# Patient Record
Sex: Male | Born: 1950 | Race: White | Hispanic: No | Marital: Married | Smoking: Never smoker
Health system: Southern US, Community
[De-identification: ages and names within clinical notes are randomized; demographics above are authoritative.]

## PROBLEM LIST (undated history)

## (undated) DIAGNOSIS — I2510 Atherosclerotic heart disease of native coronary artery without angina pectoris: Secondary | ICD-10-CM

## (undated) DIAGNOSIS — M1990 Unspecified osteoarthritis, unspecified site: Secondary | ICD-10-CM

## (undated) DIAGNOSIS — M2550 Pain in unspecified joint: Secondary | ICD-10-CM

## (undated) DIAGNOSIS — R0600 Dyspnea, unspecified: Secondary | ICD-10-CM

## (undated) DIAGNOSIS — G4730 Sleep apnea, unspecified: Secondary | ICD-10-CM

## (undated) DIAGNOSIS — E119 Type 2 diabetes mellitus without complications: Secondary | ICD-10-CM

## (undated) DIAGNOSIS — E669 Obesity, unspecified: Secondary | ICD-10-CM

## (undated) DIAGNOSIS — Z87442 Personal history of urinary calculi: Secondary | ICD-10-CM

## (undated) DIAGNOSIS — K219 Gastro-esophageal reflux disease without esophagitis: Secondary | ICD-10-CM

## (undated) DIAGNOSIS — I10 Essential (primary) hypertension: Secondary | ICD-10-CM

## (undated) DIAGNOSIS — G4733 Obstructive sleep apnea (adult) (pediatric): Secondary | ICD-10-CM

## (undated) DIAGNOSIS — N200 Calculus of kidney: Secondary | ICD-10-CM

## (undated) DIAGNOSIS — E785 Hyperlipidemia, unspecified: Secondary | ICD-10-CM

## (undated) HISTORY — DX: Calculus of kidney: N20.0

## (undated) HISTORY — DX: Type 2 diabetes mellitus without complications: E11.9

## (undated) HISTORY — DX: Essential (primary) hypertension: I10

## (undated) HISTORY — DX: Pain in unspecified joint: M25.50

## (undated) HISTORY — DX: Hyperlipidemia, unspecified: E78.5

## (undated) HISTORY — DX: Obstructive sleep apnea (adult) (pediatric): G47.33

## (undated) HISTORY — DX: Atherosclerotic heart disease of native coronary artery without angina pectoris: I25.10

## (undated) HISTORY — DX: Obesity, unspecified: E66.9

## (undated) HISTORY — DX: Gastro-esophageal reflux disease without esophagitis: K21.9

---

## 2000-11-13 ENCOUNTER — Inpatient Hospital Stay (HOSPITAL_COMMUNITY): Admission: AD | Admit: 2000-11-13 | Discharge: 2000-11-14 | Payer: Self-pay | Admitting: Internal Medicine

## 2000-11-13 ENCOUNTER — Encounter: Payer: Self-pay | Admitting: Internal Medicine

## 2000-11-16 ENCOUNTER — Ambulatory Visit (HOSPITAL_COMMUNITY): Admission: RE | Admit: 2000-11-16 | Discharge: 2000-11-17 | Payer: Self-pay | Admitting: Cardiology

## 2000-11-16 ENCOUNTER — Encounter: Payer: Self-pay | Admitting: Cardiology

## 2000-11-19 ENCOUNTER — Inpatient Hospital Stay (HOSPITAL_COMMUNITY): Admission: EM | Admit: 2000-11-19 | Discharge: 2000-11-20 | Payer: Self-pay | Admitting: Emergency Medicine

## 2000-12-04 ENCOUNTER — Inpatient Hospital Stay (HOSPITAL_COMMUNITY): Admission: EM | Admit: 2000-12-04 | Discharge: 2000-12-06 | Payer: Self-pay | Admitting: Cardiology

## 2000-12-04 ENCOUNTER — Encounter: Payer: Self-pay | Admitting: Emergency Medicine

## 2000-12-04 ENCOUNTER — Encounter: Payer: Self-pay | Admitting: Cardiology

## 2000-12-05 ENCOUNTER — Encounter: Payer: Self-pay | Admitting: Cardiology

## 2001-01-07 ENCOUNTER — Ambulatory Visit (HOSPITAL_COMMUNITY): Admission: RE | Admit: 2001-01-07 | Discharge: 2001-01-07 | Payer: Self-pay | Admitting: Internal Medicine

## 2001-01-12 ENCOUNTER — Ambulatory Visit (HOSPITAL_COMMUNITY): Admission: RE | Admit: 2001-01-12 | Discharge: 2001-01-12 | Payer: Self-pay | Admitting: Internal Medicine

## 2001-01-12 ENCOUNTER — Encounter: Payer: Self-pay | Admitting: Internal Medicine

## 2001-03-08 ENCOUNTER — Ambulatory Visit (HOSPITAL_COMMUNITY): Admission: RE | Admit: 2001-03-08 | Discharge: 2001-03-08 | Payer: Self-pay | Admitting: Internal Medicine

## 2001-08-05 ENCOUNTER — Encounter: Payer: Self-pay | Admitting: Internal Medicine

## 2001-08-05 ENCOUNTER — Ambulatory Visit (HOSPITAL_COMMUNITY): Admission: RE | Admit: 2001-08-05 | Discharge: 2001-08-05 | Payer: Self-pay | Admitting: Internal Medicine

## 2001-08-08 ENCOUNTER — Other Ambulatory Visit: Admission: RE | Admit: 2001-08-08 | Discharge: 2001-08-08 | Payer: Self-pay | Admitting: Dermatology

## 2001-11-09 ENCOUNTER — Encounter (HOSPITAL_COMMUNITY): Admission: RE | Admit: 2001-11-09 | Discharge: 2001-12-09 | Payer: Self-pay | Admitting: *Deleted

## 2004-08-12 ENCOUNTER — Ambulatory Visit (HOSPITAL_COMMUNITY): Admission: RE | Admit: 2004-08-12 | Discharge: 2004-08-12 | Payer: Self-pay | Admitting: Family Medicine

## 2004-08-14 ENCOUNTER — Inpatient Hospital Stay (HOSPITAL_COMMUNITY): Admission: AD | Admit: 2004-08-14 | Discharge: 2004-08-18 | Payer: Self-pay | Admitting: Family Medicine

## 2004-08-14 ENCOUNTER — Ambulatory Visit: Payer: Self-pay | Admitting: Internal Medicine

## 2004-09-02 ENCOUNTER — Ambulatory Visit: Payer: Self-pay | Admitting: Internal Medicine

## 2004-09-10 ENCOUNTER — Ambulatory Visit (HOSPITAL_COMMUNITY): Admission: RE | Admit: 2004-09-10 | Discharge: 2004-09-10 | Payer: Self-pay | Admitting: Internal Medicine

## 2004-11-20 ENCOUNTER — Ambulatory Visit: Payer: Self-pay | Admitting: Internal Medicine

## 2004-12-12 ENCOUNTER — Ambulatory Visit (HOSPITAL_COMMUNITY): Admission: RE | Admit: 2004-12-12 | Discharge: 2004-12-12 | Payer: Self-pay | Admitting: Internal Medicine

## 2005-05-27 ENCOUNTER — Ambulatory Visit (HOSPITAL_COMMUNITY): Admission: RE | Admit: 2005-05-27 | Discharge: 2005-05-27 | Payer: Self-pay | Admitting: Internal Medicine

## 2005-08-25 ENCOUNTER — Ambulatory Visit (HOSPITAL_COMMUNITY): Admission: RE | Admit: 2005-08-25 | Discharge: 2005-08-25 | Payer: Self-pay | Admitting: Family Medicine

## 2005-09-08 ENCOUNTER — Ambulatory Visit: Payer: Self-pay | Admitting: Internal Medicine

## 2006-03-21 ENCOUNTER — Emergency Department (HOSPITAL_COMMUNITY): Admission: EM | Admit: 2006-03-21 | Discharge: 2006-03-21 | Payer: Self-pay | Admitting: *Deleted

## 2006-06-25 ENCOUNTER — Ambulatory Visit (HOSPITAL_COMMUNITY): Admission: RE | Admit: 2006-06-25 | Discharge: 2006-06-25 | Payer: Self-pay | Admitting: Internal Medicine

## 2006-09-21 ENCOUNTER — Ambulatory Visit (HOSPITAL_COMMUNITY): Admission: RE | Admit: 2006-09-21 | Discharge: 2006-09-21 | Payer: Self-pay | Admitting: Cardiology

## 2006-10-12 ENCOUNTER — Inpatient Hospital Stay (HOSPITAL_COMMUNITY): Admission: RE | Admit: 2006-10-12 | Discharge: 2006-10-13 | Payer: Self-pay | Admitting: Cardiology

## 2007-05-13 ENCOUNTER — Inpatient Hospital Stay (HOSPITAL_COMMUNITY): Admission: RE | Admit: 2007-05-13 | Discharge: 2007-05-14 | Payer: Self-pay | Admitting: Cardiology

## 2007-05-27 ENCOUNTER — Ambulatory Visit (HOSPITAL_COMMUNITY): Admission: RE | Admit: 2007-05-27 | Discharge: 2007-05-27 | Payer: Self-pay | Admitting: Internal Medicine

## 2007-08-29 ENCOUNTER — Ambulatory Visit (HOSPITAL_COMMUNITY): Admission: RE | Admit: 2007-08-29 | Discharge: 2007-08-29 | Payer: Self-pay | Admitting: Internal Medicine

## 2010-06-29 ENCOUNTER — Encounter: Payer: Self-pay | Admitting: Internal Medicine

## 2010-10-21 NOTE — Discharge Summary (Signed)
NAMEALDEN, Shepard             ACCOUNT NO.:  [id number]   MEDICAL RECORD NO.:  [id number]          PATIENT TYPE:  INP   LOCATION:  6525                         FACILITY:  [facility]   PHYSICIAN:  Madaline Savage, M.D.DATE OF BIRTH:  [birth year]   DATE OF ADMISSION:  05/13/2007  DATE OF DISCHARGE:  05/14/2007                               DISCHARGE SUMMARY   [name] is a 60 year old male patient of Dr. Madaline Savage,  M.D., who has known coronary artery disease.  He was seen in the office.  He was having recurrent angina.  It was decided he should undergo  cardiac catheterization.  He also has diabetes, mild obesity and  hyperlipidemia.  He came in for an elective catheterization on May 13, 2007.  He was found to have in-stent restenosis (80%) of his RCA  stent.  He had a new proximal LAD lesion of 80%.  He subsequently  underwent Promus 3.0 x 15 LAD stenting, and he had a Liberty monorail of  2.5 x 8 mm as a second sandwich stent in his RCA.  His EF was 60%.   The following day his blood pressure was 120/61, pulse was 79,  respirations 12, temperature was 97.5, O2 saturations were 98%.  He was  in sinus rhythm with some PVCs.  His labs were stable.  Hemoglobin 14.5,  hematocrit 42.5 platelets 149 and WBCs 8.4.  His sodium was 140,  potassium 4.2, BUN 11, creatinine 0.92, glucose 108.  His CK-MB was  64.7, troponin of 0.39 post procedure.   He was seen by Dr. Kem Boroughs and considered stable for discharge  home.   DISCHARGE MEDICATIONS:  Essentially unchanged:  1. Toprol XL 50 mg a day.  2. Aspirin was increased to 2 baby aspirins per day.  3. Protonix 40 mg a day.  4. Altace 1.25 mg a day.  5. Zocor 20 mg at night.  6. Niaspan 500 mg at night.  7. Metformin 500 mg twice a day -- He was told not to start until      Sunday.  8. Fish oil 1000 mg twice daily.  9. Plavix 75 mg a day -- He was told not to stop.  10.Glyburide at 4 mg a day.  11.Nitroglycerin  p.r.n.   He has an appointment to see Dr. Elsie Lincoln on May 27, 2007.  He will  stay out of work for a week, because he does lifting at work; he was  given a note for this.   DISCHARGE DIAGNOSES:  1. Angina.  2. Progressive coronary artery disease, with in-stent restenosis and      proximal LAD of 80% and subsequent stenting with a Promus stent in      his left anterior descending artery and Liberty stent in his RCA.  3. Ejection fraction of 60%.  4. Noninsulin-dependent diabetes mellitus.  5. Dyslipidemia.  6. Gastroesophageal reflux disease.      Lezlie Octave, N.P.    ______________________________  Madaline Savage, M.D.    BB/MEDQ  D:  05/14/2007  T:  05/15/2007  Job:  [id number]   cc:  Kirk Ruths, M.D.

## 2010-10-21 NOTE — Cardiovascular Report (Signed)
NAMEBRAINARD, HIGHFILL             ACCOUNT NO.:  [id number]   MEDICAL RECORD NO.:  [id number]          PATIENT TYPE:  INP   LOCATION:  6525                         FACILITY:  [facility]   PHYSICIAN:  Madaline Savage, M.D.DATE OF BIRTH:  Sep 08, 1950   DATE OF PROCEDURE:  05/13/2007  DATE OF DISCHARGE:                            CARDIAC CATHETERIZATION   PROCEDURES PERFORMED:  1. Selective coronary angiography by Judkins' technique.  2. Retrograde left heart catheterization.  3. Left ventricular angiography.  4. Intracoronary artery cutting balloon angioplasty of the proximal      right coronary artery for in-stent restenosis.  5. Sandwich stenting of the proximal portion of a Liberte stent, in      the last year or two.  6. De-Novo stenting of a new stenosis in the proximal LAD.   COMPLICATIONS:  None.   ENTRY SITE:  Right femoral.   DYE USED:  Omnipaque.   MEDICATIONS GIVEN:  1. Versed and fentanyl 1 mg and 50 mg four different times.  2. Angiomax infusion.  3. Plavix 300 mg.  4. Intracoronary artery nitroglycerin.   PATIENT PROFILE:  Mr. Dunton is a delightful 60 year old white married  gentleman from the [place] area, who is a medical patient of mine.  He has had previous right coronary artery stenting, approximately a year  ago and has recently developed exertional chest pain of increasing  severity, which started about a month ago.  He saw me in the office  yesterday, and I felt that the symptoms were classic and that we should  go ahead with diagnostic cardiac catheterization at [facility].  That was  accomplished today uneventfully.  We then found two new lesions that  required tenting, and we proceeded to do that.  No complications  occurred.   ANGIOGRAPHIC RESULTS:  Left main coronary artery normal.  LAD 80% focal  eccentric stenosis, just before septal perforator branch #1 and before  bifurcation of LAD and to LAD diagonal.  Mid and distal LAD normal.  This entirety  of diagonal normal, but LAD was 80% proximal.   Circumflex gave rise to one obtuse marginal branch, and entirety of  circumflex and obtuse marginal normal.  Right coronary artery contained  a radio-opaque stent in the downgoing limb of the mid RCA.  There is an  in-stent restenosis of the upper portion of the long stent which is  actually two overlapped Liberte stents.  The distal vessel consisted of  a large marginal and three branches beyond the posterior descending.  The distal RCA looked very normal.   The RCA was stented, underwent cutting balloon angioplasty, then place  of a second stent and post-dilatation with a non-compliant balloon, to  ensure a well-opposed stent to the blood vessel wall.   Similarly, a Promus 3.0 x 15-mm stent was placed in the proximal LAD,  placed very carefully to avoid side-branch stenosis and was deployed  nicely with preservation of side branches and preservation of TIMI III  flow.   The patient had pain during LAD inflations of the balloon and the stent,  but this when away quickly with deflation of  the balloon. Likewise, ST-  segment elevation occurred, concomitant with the ST-segment elevation.   The patient exited the cath lab pain-free with stable vital signs.  He  never had arrhythmias or any complication related to this procedure  including bleeding.  We left his arterial sheath in place in the right  groin, which was 6-French.  We will remove it when his ACT is less than  150, and the patient will be a candidate for discharge tomorrow morning  if all goes well.           ______________________________  Madaline Savage, M.D.     WHG/MEDQ  D:  05/13/2007  T:  05/14/2007  Job:  [id number]   cc:   Madaline Savage, M.D.  Kirk Ruths, M.D.

## 2010-10-24 NOTE — Consult Note (Signed)
NAMETHADEUS, GANDOLFI             ACCOUNT NO.:  [id number]   MEDICAL RECORD NO.:  [id number]          PATIENT TYPE:  INP   LOCATION:  A322                          FACILITY:  [facility]   PHYSICIAN:  Lionel December, M.D.    DATE OF BIRTH:  Jul 03, 1950   DATE OF CONSULTATION:  08/15/2004  DATE OF DISCHARGE:                                   CONSULTATION   REASON FOR CONSULTATION:  Possible mass on CT scan.   REQUESTING PHYSICIAN:  Patrica Duel, M.D.   HISTORY OF PRESENT ILLNESS:  The patient is a 60 year old Caucasian male  with five day history of postprandial nausea, vomiting, crampy abdominal  pain with abdominal distention. About a week ago, he broke out with shingles  in the right lower chest region. He noticed after that that every time he  ate he would develop abdominal pain, nausea, vomiting, and abdominal  distention. He ate a normal meal Sunday night and developed all of these  symptoms. He did not eat until Tuesday when he consumed a bowl of soup and  the symptoms returned. He says he has only eaten about three times in the  last week. Prior to this, he was having no post prandial symptoms. Denied  any chronic abdominal pain, constipation, diarrhea, melena, or rectal  bleeding. No hematemesis. His only GI issue is that of chronic poorly  controlled acid reflux which he has had for three to four years. He is on  Protonix 40 mg b.i.d. with continued symptoms. Denies any dysphagia,  odynophagia, or weight loss. He has actually had gradual weight gain. He did  note Monday that his stools became a little loose.   Upon admission, he had a CT of the abdomen and pelvis which revealed  multiple dilated loops of the distal jejunum and proximal ileum. The gastric  antrum, the duodenum, distal ileum, and colon all appeared to be  decompressed. There was a questionable soft tissue mass in the distal ileum  at approximately 10 cm from the ileocecal valve. There was questionable  transition  point somewhere along the mid to distal ileum. There were  numerous enlarged lymph nodes of the mesentery, the largest measuring 1.9 x  1.3 cm. There was minimal aortic atherosclerosis. Gallbladder and liver  appeared to be normal.   Labs revealed elevated glucose of 220. CBC was normal. BUN was 21.  Creatinine 1. Total bilirubin 0.8, alkaline phosphatase 91, AST elevated at  38, ALT elevated at 60, albumin 3.4, amylase 39, lipase 20.   MEDICATIONS AT HOME:  1.  Toprol 50 mg daily.  2.  Altace.  3.  Protonix 40 mg b.i.d.  4.  Zocor.  5.  Niaspan 500 mg q.d.  6.  Aspirin 325 mg q.d.  7.  Recently started Valtrex.   ALLERGIES:  No known drug allergies.   PAST MEDICAL HISTORY:  1.  Gastroesophageal reflux disease. He has had poorly controlled symptoms.      In 2002, he had an esophageal manometry which was normal; however, a 24-      hour ambulatory pH study off the medication revealed pathological  acid      reflux with a DeMeester score of 16.7. He also had an EGD prior to this      which revealed a couple of superficial antral erosions and negative      helicobacter pylori serologies. He had colonoscopy a few years ago. He      is not clear on the results. He thinks he might have had a polyp. He has      had coronary artery disease with catheterization most recently in 2002      which revealed patency of his stents and prior angioplasty sites. He has      had hypertension, hyperlipidemia, and recent outbreak of shingles in the      right lower chest region.   FAMILY HISTORY:  Negative for colorectal cancer, chronic GI illnesses, or  inflammatory bowel disease or liver disease.   SOCIAL HISTORY:  He is married and has two children. He is employed at  [employer]. He denies any history of tobacco use. He rarely  consumes alcohol.   REVIEW OF SYSTEMS:  GASTROINTESTINAL:  See HPI for GI. He has poorly  controlled acid-reflux symptoms. He complains of constant heartburn.  Denies  any prior history of elevated LFTs. CONSTITUTIONAL:  Complains of chronic  weight gain. CARDIOPULMONARY:  Denies chest pain or shortness of breath.   PHYSICAL EXAMINATION:  VITAL SIGNS:  Height 72 inches, weight 242,  temperature 98.1, pulse 94, respirations 20, blood pressure 136/85.  GENERAL:  Pleasant, obese, Caucasian male in no acute distress.  SKIN:  Warm and dry. No jaundice.  HEENT:  Pupils are equal, round, and reactive to light. Conjunctivae are  pink. Sclerae are nonicteric. Oropharyngeal mucosa moist and pink. No  lesions, erythema, or exudate. No lymphadenopathy or thyromegaly.  CHEST:  Lungs are clear to auscultation. Cardiac exam reveals regular rate  and rhythm. Normal S1 and S2. No murmurs, rubs, or gallops.  ABDOMEN:  Positive bowel sounds, hypoactive bowel sounds, obese but  symmetrical. Abdomen somewhat tight. Mild diffuse tenderness to deep  palpation. No organomegaly or masses appreciated. No rebound tenderness or  guarding. In the right lower chest region, he had a small patch of  approximately 2 cm in length of erythema and papules consistent with  shingles.  EXTREMITIES:  No edema.   LABORATORY DATA:  White count 7,300, hemoglobin 15.7, hematocrit 44.3,  platelets 192,000. Sodium 135, potassium 4.1, calcium 9.2. Amylase 39,  lipase 20.   IMPRESSION:  The patient is a 60 year old gentleman with what appears to be  small-bowel obstruction with possible transition zone in the mid to distal  ileum where there is a questionable soft tissue mass seen according to the  radiology report. There is also numerous mildly enlarged mesenteric lymph  nodes. Etiology is unclear at this time. Currently, he is asymptomatic  although he has not had any oral intake, and this has been the pattern of  his symptoms over the last week. He does not describe any chronic GI  symptoms suggestive of inflammatory bowel disease. Would had to be concerned  about lymphoma. 1.  He  also has mild transaminitis. It is unclear whether this is a chronic      issue for him. He is on Zocor and did previously have fatty infiltration      of the liver seen on ultrasound in 2002 although CT scan yesterday      reports liver to appear normal. He is noted to have elevated glucose on  admission and may have diabetes mellitus. Suspect his transaminitis is      due to nonalcoholic steatohepatitis given obesity and possible diabetes      mellitus. This will need to be further investigated, however.   RECOMMENDATIONS:  1.  Review CT and today's abdominal films.  2.  Hepatitis B and C serologies, ferritin, iron, and TIBC.  3.  Hemoglobin A1c.  4.  IV Protonix.      LL/MEDQ  D:  08/15/2004  T:  08/15/2004  Job:  [id number]

## 2010-10-24 NOTE — Op Note (Signed)
[facility]  Patient:    Jonathan Shepard, Jonathan Shepard Visit Number: [id number] MRN: [id number]          Service Type: END Location: DAY Attending Physician:  Jonathon Bellows Dictated by:   Tana Coast, P.A. Proc. Date: 03/09/01 Admit Date:  03/08/2001 Discharge Date: 03/08/2001   CC:         Karleen Hampshire, M.D.   Operative Report  PROCEDURE:  Ambulatory esophageal pH report  REFERRING/PRIMARY CARE PHYSICIAN:  Karleen Hampshire, M.D.  INDICATIONS:  Noncardiac chest discomfort unresponsive to high-dose proton pump inhibitor.  PRIOR STUDIES:  Please see esophageal manometry report.  METHOD:  The pH electrodes were placed 20 and 5 cm above the proximal border of the manometrically determined lower esophageal sphincter (LES). These electrodes were defined as channels 1 and 2, respectively. The data was converted using the [employer], version 2.30. A reflux episode was defined as a drop in pH below 4.0. The procedure was reviewed with the patient prior to performing it.  FINDINGS:  Proximal border of the LES (location from nares):  45 cm. Study duration:  Twenty-three hours and 31 minutes. Channel 2 analysis: Total time pH below 4.0 (min):  Seventy-four. Fraction of total time pH below 4.0:  5.2 Number of reflux episodes:  Forty-four. Number of reflux episodes longer than five minutes:  Two. Longest reflux episode:  Twenty-seven minutes. DeMeester score (DeMeester normals <14.7 (95th percentile):  16.7  IMPRESSION:  Study was performed while patient off of PPI and antacid therapy. Channel 2 analysis demonstrated significant acid reflux.  DeMeester score was elevated as well.  There were no reflux symptoms reported on patients diary during the study.  I attempted to call the patient to determine whether he had any reflux symptoms/chest discomfort during the study, however, was unable to get in contact with him.  Symptoms of chest  discomfort may be related to acid reflux.  SUGGESTIONS:  Continue antireflux measures.  Will ask the patient to restart Protonix and to increase to 40 mg p.o. b.i.d.  Will discuss results further with the patient. Dictated by:   Tana Coast, P.A. Attending Physician:  Jonathon Bellows DD:  03/17/01 TD:  03/18/01 Job: [id number]

## 2010-10-24 NOTE — Discharge Summary (Signed)
Jonathan Shepard, Jonathan Shepard             ACCOUNT NO.:  [id number]   MEDICAL RECORD NO.:  [id number]          PATIENT TYPE:  INP   LOCATION:  6533                         FACILITY:  [facility]   PHYSICIAN:  Madaline Savage, M.D.DATE OF BIRTH:  1951/02/04   DATE OF ADMISSION:  10/12/2006  DATE OF DISCHARGE:  10/13/2006                               DISCHARGE SUMMARY   DISCHARGE DIAGNOSES:  1. Abnormal Cardiolite study with subsequent catheterization revealing      coronary disease, status post elective right coronary artery      Liberte stent placement this admission.  2. Prior right coronary artery intervention in the past.  3. Hypertension.  4. Treated dyslipidemia.  5. Non-insulin dependent diabetes.   HOSPITAL COURSE:  The patient is a 60 year old male who has a history of  coronary disease, he had had previous RCA stenting in the past and also  an optional diagonal angioplasty in the past.  Last catheterization was  in July 2002.  The patient has multiple risk factors for question of  coronary disease.  He had an abnormal outpatient Cardiolite study and  was admitted for elective angiogram Oct 12, 2006.  This was done by Dr.  Elsie Lincoln.  This revealed a 75% proximal RCA site that was dilated and  stented with a Liberte stent.  The patient noted that the patient was  cathed late in April at the [facility] which showed a normal LV  function, normal LAD with 50% narrowing, normal circumflex.  The patient  tolerated the elective RCA intervention and can be discharged Oct 13, 2006.  He did have a slight rise in his troponin which Dr. Elsie Lincoln feels  is understandable secondary to the difficulty of the procedure.  He will  be followed up in the [place], [place], office.   LABORATORY DATA:  White count 6.1, hemoglobin 14.1, hematocrit 42.7,  platelets 146.  Sodium 136, potassium 3.9, BUN 9, creatinine 0.8.  CK-MB  negative with CK of 50, MB of 1.6, troponin 0.05.   DISPOSITION:  The  patient is discharged in stable condition and will  follow-up with Dr. Elsie Lincoln in [place], [place].   DISCHARGE MEDICATIONS:  1. Toprol XL 50 mg a day.  2. Protonix 40 mg a day.  3. Altace 1.25 mg a day.  4. Aspirin 81 mg a day.  5. Plavix 75 mg a day.  6. Niaspan 500 mg at bedtime.  7. Glimepiride 4 mg a day.  8. Metformin 500 mg two times a day to start Friday.  9. Simvastatin 20 mg a day.  10.Nitroglycerin sublingually p.r.n.      Abelino Derrick, P.A.    ______________________________  Madaline Savage, M.D.    Lenard Lance  D:  10/13/2006  T:  10/13/2006  Job:  [id number]   cc:   Kirk Ruths, M.D.

## 2010-10-24 NOTE — Procedures (Signed)
[place]  Patient:    Jonathan Shepard, Jonathan Shepard                    MRN: [id number] Proc. Date: 11/16/00 Adm. Date:  [id number] Attending:  Ophelia Shoulder CC:         Elfredia Nevins, M.D. at [facility] [place], [place]   Procedure Report  PROCEDURE PERFORMED: 1. Selective coronary angiography by Judkins technique. 2. Retrograde left heart catheterization. 3. Left ventricular angiography. 4. Coronary artery dilatation with both predilatation with a balloon    angioplasty catheter. 5. Percutaneous coronary stent.  COMPLICATIONS:  None.  ENTRY SITE:  Right femoral.  DYE USED:  Omnipaque.  HISTORY OF PRESENT ILLNESS:  The patient is a 60 year old white male resident of [place], [place], who was recently hospitalized in [facility] for chest pain. His EKGs showed only some mild Q wave abnormalities, one in aVL. Cardiac enzymes were negative. He was discharged from the hospital there day before yesterday. Today, is set up for outpatient cardiac catheterization. The patient is stable at the current time.  RESULTS:  The left ventricular pressure and central aortic pressure were normal.  ANGIOGRAPHIC RESULTS:  The right coronary artery is a large and dominant vessel giving rise to a posterior descending branch and a large posterolateral branch.  The mid RCA at the takeoff of a small acute marginal branch has an eccentric stenosis of 95%. The distal vessel is normal.  The left main coronary artery is small and contains no obvious lesions.  The left anterior descending coronary artery courses toward the cardiac apex and gives rise to one major diagonal branch and a large septal perforator branch. There is an ostial diagonal stenosis of approximately 50 to 60%.  The left circumflex coronary artery gives rise to multiple obtuse marginal branches and a large atrial circumflex branch. No lesions were  seen.  The left ventricle shows normal segmental wall motion. Ejection fraction is 60%. No mitral regurgitation is seen.  The percutaneous coronary artery was performed on the mid RCA using a 7-French guide catheter, a 180 cm Patriot wire and a predilatation balloon was a 2.0 x 15 mm. We inflated it to 6 atm. We then were able to pass a 3.0 x 13 mm Hepacoat stent by Cordis and inflated it to 12 atm and then 14 atm with a successful reduction of the lesion from 95% to 0% residual.  The patient had chest pain during the inflations and continued to have discomfort after inflations. We gave him sublingual nitroglycerin spray. We then gave him IV nitroglycerin. We gave him Fentanyl and gave him time, i.e. 30 minutes. The pain gradually dissipated. The EKG was perfectly normal and comparable to what it looked like prior to the intervention.  The patient had stable vital signs upon discharge from the catheterization lab.  FINAL DIAGNOSES: 1. Coronary artery disease consisting of (a) 95% stenosis of the mid right    coronary artery, (b) 60% ostial stenosis of first diagonal. 2. Normal left ventricular systolic function. 3. Successful coronary artery stenting of the mid RCA with reduction of the    95% lesion to 0% residual. DD:  11/16/00 TD:  11/16/00 Job: [id number]

## 2010-10-24 NOTE — H&P (Signed)
NAMESADIK, Shepard             ACCOUNT NO.:  [id number]   MEDICAL RECORD NO.:  [id number]         PATIENT TYPE:  AMB   LOCATION:                                 FACILITY:   PHYSICIAN:  R. Roetta Sessions, M.D. DATE OF BIRTH:  28-Oct-1950   DATE OF ADMISSION:  DATE OF DISCHARGE:  LH                                HISTORY & PHYSICAL   HISTORY OF PRESENT ILLNESS:  Jonathan Shepard is a 60 year old obese gentleman  with hyperlipidemia, elevated hemoglobin A1c, hospitalized with postprandial  nausea, vomiting, crampy abdominal pain earlier this month and has also been  seen Dr. Jarold Motto. Apparently, there is a questionable mass on CT scan. He  improved with conservative therapy. He did have CT scans done. I do not have  the reports for review at this time. He had an elevation in his  transaminases; hepatitis C, hepatitis B markers were negative. Iron studies  were done; percent saturation 19%, serum iron 57, total iron binding  capacity 300. Hemoglobin A1c was 8.9. I do not have any blood sugars.  Ferritin 287. Dr. Lovell Sheehan apparently did colonoscopy on him several years  ago. He is having recurrent reflux symptoms.  He has longstanding  gastroesophageal reflux disease. He is taking Protonix 40 mg orally twice  daily and has reflux symptoms pretty much intermittently every day. He  continues to be significantly over his ideal body weight for height and body  frame. He is not having odynophagia or dysphagia. He was on Niaspan and  Zocor. These agents were stopped while he was hospitalized. Prior EGD and 24-  hour ambulatory pH monitoring confirmed gastroesophageal reflux disease in  2002.   PAST MEDICAL HISTORY:  Coronary artery disease status post cardiac  catheterization 2002. He is status post stenting and angioplasty,  hypertension, hyperlipidemia. Recent outbreak of shingles, now improving.   CURRENT MEDICATIONS:  Aspirin 325 mg daily, Toprol 50 mg daily, Altace 1.25  mg daily, Protonix  40 mg b.i.d., Naprosyn 500 mg p.r.n., Niaspan 500 mg  held, Tylenol p.r.n., Osteo-Bi-Flex daily.   ALLERGIES:  No known drug allergies.   FAMILY HISTORY:  Negative for chronic GI or liver disease, known history of  inflammatory bowel disease.   SOCIAL HISTORY:  He is married. He has two children. He is employed with  [employer]. No history of alcohol or tobacco.   REVIEW OF SYSTEMS:  As per HPI.   PHYSICAL EXAMINATION:  GENERAL:  Pleasant, obese 60 year old gentleman  resting comfortably.  VITAL SIGNS:  Weight 244, temperature 97.2, blood pressure 138/88, pulse 64.  SKIN:  Warm and dry.  HEENT:  No scleral icterus. JVP is not prominent.  CHEST:  Lungs are clear to auscultation.  CARDIAC:  Regular rate and rhythm without murmur, gallop, rub.  ABDOMEN:  Obese, positive bowel sounds, soft, nontender. No appreciable mass  or organomegaly.   IMPRESSION:  [name] is a 60 year old gentleman with basically  metabolic syndrome, hemoglobin B1Y almost 9. He likely has diabetes with  refractory reflux symptoms in the setting of obesity. Recent hospitalization  where he was found to have elevated  transaminases, etiology of which is not  clear. Prior ultrasound demonstrated a fatty liver. I really do not have any  of the scans for review at this time. We need to get them, also to need to  get Dr. Lovell Sheehan' colonoscopy report.   He is having reflux despite taking Protonix 40 mg orally b.i.d. The man's  further evaluation, it may be that he has gastroesophageal reflux disease,  but we need to substantiate that before pursuing further evaluation. To this  end, I have offered Jonathan Shepard and EGD in the very near future if he does  not have any esophagitis, we will give it in place of Bravo pH probe. We  will see that he had an adequate colonoscopy within a reasonable time  interval. We will see that he has had one with Dr. Lovell Sheehan' report. If not,  we will add a colonoscopy to EGD  in the very near future. He needs to have  his liver function tests repeated at this point in time. We will continue to  hold Zocor and Niaspan until we get more information. Potential risks,  benefits, alternatives to the EGD and possible colonoscopy were fully  discussed with Jonathan Shepard. His questions were answered. I have encouraged  weight loss and will make further recommendations in the very near future. I  have advised Jonathan Shepard to follow-up on blood sugars and see Dr. Milinda Cave in  the very near future.      RMR/MEDQ  D:  09/02/2004  T:  09/02/2004  Job:  [id number]   cc:   R. Roetta Sessions, M.D.  [address]  [place]  [place] [address]   Jeoffrey Massed, MD  [address]  [place]  [place] [address]  Fax: [phone]

## 2010-10-24 NOTE — Cardiovascular Report (Signed)
Jonathan Shepard, Jonathan Shepard             ACCOUNT NO.:  [id number]   MEDICAL RECORD NO.:  [id number]          PATIENT TYPE:  OIB   LOCATION:  2807                         FACILITY:  [facility]   PHYSICIAN:  Madaline Savage, M.D.DATE OF BIRTH:  January 12, 1951   DATE OF PROCEDURE:  10/12/2006  DATE OF DISCHARGE:                            CARDIAC CATHETERIZATION   OUTPATIENT PERCUTANEOUS CORONARY STENT PROCEDURE:   PROCEDURES PERFORMED:  Elective outpatient percutaneous coronary artery  stenting of the mid-right coronary artery.   COMPLICATIONS:  None.   ENTRY SITE:  Right femoral.   DYE USED:  Omnipaque.   CATHETERS USED:  6-French catheters.   Technically, this study was very difficult owing to the patient's  anatomy which made it hard to achieve good backup support in the right  coronary ostium and also because of difficulty in crossing the stenotic  area.  The procedure was successful, however, with an excellent result.  No complications occurred.   PATIENT PROFILE:  [name] is a very pleasant 60 year old gentleman  who has previously had a stent placed in his mid right coronary artery  just above an acute marginal branch.  That stent is open, but there is  an area just above the stent that has an area of 75% stenosis, and the  recent Persantine Myoview stress test he had showed worsened ischemia in  the right coronary artery territory since the last stress test.  Because  the patient has had increasing symptoms and because the nuclear medicine  stress test showed worsened ischemia, he underwent cardiac  catheterization on an outpatient basis at the [facility]  and was found to have that stenosis and was brought to [facility] for an  outpatient percutaneous intervention after he was loaded with Plavix for  the last week or so.  No complications occurred.   RESULTS:  The right coronary artery contained a radio-opaque stent  midway down the vessel, and just superior to  that patent stent was an  area of 75% eccentric stenosis.  The case required multiple guiding  catheters which included a JR-4, a Judkins right with extra backup both  in a 3.5 and a 4.5 configuration, and finally a left Amplatz #1 guide  catheter which allowed successful stenting of the vessel.  I had to use  buddy wires consisting of a Prowater and a balanced middle weight wire,  and after much technical difficulty, I was able to wire the vessel,  predilate the proximal RCA at a point where I could not get the stent to  traverse down the very proximal portion of the vessel.  A balloon  angioplasty was performed there.  I then was able to stent the culprit  vessel which was a 75% eccentric stenosis in the proximal RCA and  deployed it and post dilatation with a 3.25 and then a 3.5 Quantum  Maverick balloon taken up to 19 atmospheres at 60 seconds.  The result  showed that the eccentric plaque on one side of the vessel was reduced  to 10%, down from 75% which was the original extent of stenosis.  I then  viewed the vessel in 2 views, and it looked very good with no loss of  any side branches or distal vessels.   The case was tolerated well.  There were no procedural complications.  The patient received 300 mg more Plavix during the case and Angiomax was  infused during the case with an ACT of greater than 300.   FINAL DIAGNOSES:  1. Recent increase in anginal symptoms.  2. Outpatient cardiac catheterization showing a 75% stenosis above a      previously deployed right coronary artery stent which looked      pristine.  3. Good left ventricular systolic function on an outpatient basis      recently.  4. Direct coronary artery stenting of the mid right coronary artery      after predilatation of the proximal right coronary artery to      achieve passage of the stent.   PLAN:  The patient will be watched overnight for 23 hours and home  tomorrow and follow-up will be in the [place]  office, [facility].           ______________________________  Madaline Savage, M.D.     WHG/MEDQ  D:  10/12/2006  T:  10/12/2006  Job:  [id number]   cc:   [facility] Medical Records  [facility]

## 2010-10-24 NOTE — Op Note (Signed)
NAME:  Jonathan Shepard, Jonathan Shepard             ACCOUNT NO.:  [id number]   MEDICAL RECORD NO.:  [id number]          PATIENT TYPE:  AMB   LOCATION:  DAY                           FACILITY:  [facility]   PHYSICIAN:  R. Roetta Sessions, M.D. DATE OF BIRTH:  August 18, 1950   DATE OF PROCEDURE:  09/10/2004  DATE OF DISCHARGE:                                 OPERATIVE REPORT   PROCEDURE PERFORMED:  Esophagogastroduodenoscopy with Bravo pH probe  placement.   INDICATIONS FOR PROCEDURE:  A 60 year old gentleman with refractory reflux  symptoms in spite of taking  Protonix 40 mg orally twice daily.  EGD with  Bravo probe placement is now being performed.  This approach has been  discussed with the patient at length.  Potential risks, benefits and  alternatives have been reviewed and questions answered. He is agreeable.  Please see documentation in medical record.   PROCEDURE NOTE:  Oxygen saturations, blood pressure, pulse and respirations  were monitored throughout the entire procedure.  Conscious sedation with  Versed 4 mg IV, Demerol 100 mg IV in divided doses.   INSTRUMENT USED:  Olympus video chip system.   FINDINGS:  Examination of the tubular esophagus revealed no mucosal  abnormalities.  Esophagogastric junction was easily traversed.   Stomach:  The gastric cavity was empty and insufflated well with air.  Thorough examination of the gastric mucosa including a retroflex view of the  proximal stomach, esophagogastric junction demonstrated multiple prepyloric  antral erosions.  Otherwise gastric mucosa appeared normal.  Pylorus patent  and easily traversed.  Examination of the bulb and second portion revealed  no abnormalities.   THERAPEUTIC/DIAGNOSTIC MANEUVERS PERFORMED:  The esophagogastric junction  was measured from the incisors with the stomach inflated and deflated, 42  and 43 cm.  Subsequently, the scope was withdrawn.  The Bravo pH catheter  was advanced orally down to 37 cm from the incisors. It  was deployed in  standard fashion. A look back revealed the probe was satisfactorily attached  to the esophageal mucosa.  See photos.  The patient tolerated the procedure  well and was reacted in endoscopy.   IMPRESSION:  1.  Normal esophagus, status post placement of Bravo pH probe.  2.  Multiple antral prepyloric erosions, otherwise normal gastric mucosa,      normal D1, D2.   RECOMMENDATIONS:  1.  Continue Protonics 40 mg orally twice daily.  Will check Helicobacter      pylori serologies.  2.  Prepyloric erosions may be secondary to Naprosyn and/or Niaspan effect.      Further recommendations to follow.      RMR/MEDQ  D:  09/10/2004  T:  09/10/2004  Job:  [id number]   cc:   Jeoffrey Massed, MD  [address]  [place]  [place] [address]  Fax: [phone]

## 2010-10-24 NOTE — H&P (Signed)
Jonathan Shepard, Jonathan Shepard             ACCOUNT NO.:  [id number]   MEDICAL RECORD NO.:  [id number]         PATIENT TYPE:  INP   LOCATION:  A322                          FACILITY:  [facility]   PHYSICIAN:  Kirk Ruths, M.D.DATE OF BIRTH:  12-06-1950   DATE OF ADMISSION:  DATE OF DISCHARGE:  LH                                HISTORY & PHYSICAL   CHIEF COMPLAINT:  Abdominal pain and vomiting.   HISTORY OF PRESENT ILLNESS:  This is a 60 year old male who had a two-day  history of abdominal pain with diarrhea, vomiting and bloating.  The patient  was seen in the office and appeared miserable with persistent vomiting.  The  abdomen was distended and tender.  The patient is being admitted for  abdominal pain, rule out acute abdomen.  Incidentally, the patient had a  physical earlier this week and was found to have new onset of diabetes.   PAST MEDICAL HISTORY:  1.  History of hypertension.  2.  Hyperlipidemia.  3.  Coronary artery disease.   MEDICATIONS:  1.  Aspirin daily.  2.  Toprol 50 mg daily.  3.  Zocor 4 mg daily.  4.  Altace 1.25 mg daily.  5.  Protonix 40 mg b.i.d.  6.  Niaspan 500 mg daily.  7.  Osteo Bi-Flex.   REVIEW OF SYSTEMS:  Denies blood in the stool, black stool, chest pain or  fever.   PHYSICAL EXAMINATION:  GENERAL APPEARANCE:  A middle-aged male, who is pale  and miserable.  VITAL SIGNS:  His blood pressure is 130/80, his pulse is 80 and regular, his  temperature is 97.8 degrees and respirations are 20.  HEENT:  TMs are normal.  Pupils equal, round and reactive to light and  accommodation.  The oropharynx is benign.  NECK:  Supple without JVD, bruit or thyromegaly.  LUNGS:  Clear in all areas.  HEART:  Regular sinus rhythm without murmur, rub or gallop.  ABDOMEN:  Tense and distended.  It is tympanitic with hyperactive bowel  sounds.  Diffuse tenderness throughout.  EXTREMITIES:  Without cyanosis, clubbing or edema.  NEUROLOGIC:  Grossly intact.   ASSESSMENT:  1.  Abdominal pain, probably gastroenteritis, rule out acute abdomen.  2.  History of coronary artery disease.  3.  History of hypertension.  4.  History of hyperlipidemia.  5.  Type 2 diabetes.      WMM/MEDQ  D:  08/14/2004  T:  08/14/2004  Job:  [id number]

## 2010-10-24 NOTE — Discharge Summary (Signed)
[place]  Patient:    Jonathan Shepard, Jonathan Shepard                    MRN: [id number] Adm. Date:  [id number] Disc. Date: [id number] Attending:  Ophelia Shoulder Dictator:   Darcella Gasman. Annie Paras, F.N.P.C. CC:         Elfredia Nevins, M.D., [facility]   Discharge Summary  DISCHARGE DIAGNOSES: 1. Angina. 2. Coronary artery disease.    a. Percutaneous transluminal coronary angioplasty and stent deployment       to the mid right coronary artery.    b. 50 to 60% ostial diagonal stenosis.    c. Ejection fraction 60%. 3. Hypertension controlled. 4. Nonsustained ventricular tachycardia.  CONDITION ON DISCHARGE:  Improved.  PROCEDURE:  November 16, 2000, combined left heart catheterization by Madaline Savage, M.D.  November 16, 2000, PTCA and stent deployment to 95% eccentric stenosis of the RCA resulting in less than 0% stenosis.  DISCHARGE MEDICATIONS: 1. Toprol XL 50 mg one daily. 2. Altace 1.25 mg daily. 3. Plavix 75 mg daily for one month. 4. Enteric-coated aspirin 325 mg one daily. 5. Zocor 20 mg one every evening. 6. Nitroglycerin 1/150 one sublingual p.r.n. chest pain.  DISCHARGE INSTRUCTIONS: 1. No strenuous activity, no sexual activity, and no lifting over 10 pounds    for three days. 2. May return to work on Monday, November 22, 2000. 3. Low fat, low salt diet. 4. Wash catheterization site with soap and water.  Call with any bleeding,    swelling, or drainage. 5. Follow up with Dr. Elsie Lincoln on December 08, 2000, at 10 a.m.  Be a little early    to fill out paper work in [place], [place].  HISTORY OF PRESENT ILLNESS:  A 60 year old white married male was admitted to [facility] on November 13, 2000, for chest pain.  The pain occurred on Friday, November 12, 2000, in the evening while walking with his wife. He became significantly short of breath with bilateral arm pain/numbness.  He rested and the discomfort resolved.  Associated  symptoms were none.  He only had the arm numbness and the shortness of breath.  He presented to his physicians office on Saturday, was admitted to [facility] and ruled out for myocardial infarction.  Plans were made for cardiac catheterization on November 16, 2000.  The patient had no history of prior chest pain prior to this episode.  PAST MEDICAL HISTORY: 1. Hypertension x 2 years. 2. No cardiac history.  PAST SURGICAL HISTORY:  None.  ALLERGIES:  No known drug allergies.  MEDICATIONS: 1. Zabeta 5 mg one daily. 2. Aspirin 325 mg one daily. 3. Claritin-D as needed.  FAMILY HISTORY:  Mother is alive.  She is positive for diabetes.  Father is alive in his 68s.  He has had three angioplasties, his first was in his late 39s.  He has two brothers alive and well, two sisters alive and well.  SOCIAL HISTORY:  Married.  Two children, one stepdaughter.  No grandchildren. No tobacco and no alcohol.  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  REVIEW OF SYSTEMS:  See H&P.  DISCHARGE PHYSICAL EXAMINATION:  VITAL SIGNS: Blood pressure 120/74, pulse 74, respirations 14, temperature 97.7, room air oxygen saturation 96%.  GENERAL: Alert and oriented white male in no acute distress.  HEART: S1 and S2 regular rate and rhythm without murmur, gallop, rub, or click.  LUNGS: Clear without rubs, rhonchi, or wheezes.  EXTREMITIES: Right groin catheterization site without hematoma, no bruising, no bruit.  2+ pedals bilaterally and no lower extremity edema.  LABORATORY DATA:  At [facility], cardiac enzymes were all negative with CK 41, MB 0.4, and troponin I 0.01 and that was November 13, 2000. Labs at [facility] had been normal.  At [facility], hemoglobin 14.4, hematocrit 41.6, platelets 172, WBC 6.2, pro-time 12.6, INR 1, PTT 28. Cardiac enzymes; CK 38, MB 0.7, and these were all postprocedure and were done for Replace II study.  Second CK  was 66, MB 5.8, and third CK 96 and MB 11.6. Troponin was done at this point which was 0.78.  Basic metabolic panel was done and everything was normal with potassium of 3.7. He was given an additional 20 mEq of potassium just for the nonsustained ventricular tachycardia he had had the night before.  Chest x-ray on arrival today in the hospital showed some probable bronchitis. EKG on admission; sinus rhythm, normal EKG, post PTCA and stent with residual chest pain.  No interval change compared to baseline EKG.  Follow-up on June 12, showed normal sinus rhythm and normal EKG.  PROCEDURE:  November 16, 2000, PTCA and stent to the right coronary artery by Madaline Savage, M.D.  November 16, 2000, combined left heart catheterization by Madaline Savage, M.D.  HOSPITAL COURSE:  Mr. Pangilinan was brought into [facility] on November 16, 2000, after being admitted to [facility] over the weekend for chest pain rule out MI.  He had been ruled out, but secondary to significance of his chest discomfort and positive family history and hypertension history.  The patient was brought in for outpatient catheterization.  He was found to have a 95% RCA stenosis.  He underwent PTCA and stent deployment to that area.  He also had a 50% stenosis of the ostial diagonal.  EF was 60%.  The patient tolerated the procedure well.  He was placed in the Replace II study.  November 17, 2000, he had no complaints, no chest pain, and was ambulating without difficulty. The night before, he did have eight beats of ventricular tachycardia and none since.  His Zabeta was changed to Toprol.  Other medications adjustments were made with a positive history of coronary disease. It was believed that cardiac enzyme mild elevation was only a result of the intervention and did not indicate myocardial infarction.  The patient was able to ambulate, no further complaints, and was discharged home on November 16, 2000,  and would follow up with Dr. Elsie Lincoln as well as Dr. Sherwood Gambler.  He also would like to do cardiac rehabilitation phase II which we thought would be helpful to this patient with lifestyle changes with exercise supplementation did need to be changed.  He was discharged by Dr. Jenne Campus and Dr. Elsie Lincoln. DD:  11/17/00 TD:  11/17/00 Job: [id number]

## 2010-10-24 NOTE — Cardiovascular Report (Signed)
[place]. [facility]  Patient:    Jonathan Shepard, Jonathan Shepard                    MRN: [id number] Proc. Date: 11/19/00 Adm. Date:  [id number] Attending:  Darlin Priestly CC:         Madaline Savage, M.D.   Cardiac Catheterization  PROCEDURES: 1. Left coronary angiography. 2. Diagonal - ostial, Cutting Balloon atherectomy.  COMPLICATIONS:  None.  INDICATIONS:  The patient is a 60 year old, white male, patient of Dr. Lavonne Chick, recently underwent PTCA and stenting to his mid RCA and enrolled in REPLACE study.  The patient was discharged to home approximately 48 hours ago and continued to have intermittent chest pain.  He did take a sublingual nitroglycerin this morning with relief of his chest pain.  He is now brought back to the emergency room for unstable angina for repeat cardiac catheterization.  DESCRIPTION OF PROCEDURE:  After given informed written consent, the patient was brought to the cardiac catheterization lab where his right and left groins were shaved, prepped, and draped in the usual sterile fashion.  ECG monitoring was established.  Using modified Seldinger technique, a #6 French arterial sheath was inserted in the right femoral artery.  A 6 French diagnostic catheter was then used to perform diagnostic angiography.  This reveals a large left main with no significant disease.  The LAD is a medium sized vessel which coursed to the apex and gave rise to one large diagonal branch.  The LAD is noted to have a 50% stenotic lesion at the takeoff of the first diagonal, 50% mid vessel lesion and a 40% distal lesion.  The first diagonal is a large vessel with a 90% ostial lesion.  The second diagonal is a small vessel with no significant disease.  The left circumflex is a large vessel which coursed in the AV groove and gave rise to three obtuse marginal branches.  The AV groove circumflex has no significant disease.  The first OM is a large vessel  with no significant disease.  The second OM is a large vessel which bifurcates in its mid segment and has no significant disease.  The third OM is a medium sized vessel with no significant disease.  The right coronary artery is a large vessel which is dominant and gives rise to both the PDA as well as the posterolateral branch.  There is a stent noted in the mid segment of the RCA which is widely patent.  There is 40% stenotic lesion just prior to the beginning of the stent.  The PDA and posterolateral branch have no significant disease.  No LV gram was performed.  HEMODYNAMICS:  Systemic arterial pressure 115/74.  INTERVENTIONAL PROCEDURE:  Diagonal #1 - ostial:  Following diagnostic angiography, a #7 French arterial sheath was then exchanged for a 6 French arterial sheath.  A #7 [place] guiding catheter was coaxially engaged in the left coronary ostium.  Next, a 0.014 short Patriot guide wire was positioned in the LAD without difficulty.  A second 0.014 Patriot guide wire was positioned in the distal first diagonal without difficulty.  Next, a 2.0 x 10 mm Cutting Balloon was then tracked across the ostium of the first diagonal and approximately three inflations were performed to a maximum of 6 atmospheres for a total of three minutes.  Followup angiogram revealed good luminal gain; however, this balloon appeared to be undersized.  This balloon was then exchanged for a 2.5  x 10 Cutting Balloon which was again positioned across the ostium of the vessel.  Three subsequent inflations to a maximum of 6 atmospheres were then performed for a total of 3 minutes.  Followup angiogram revealed approximately 20% residual stenosis with TIMI-3 in the distal vessel and no evidence of dissection or thrombus.  Intravenous doses of heparin were given to maintain ACT between 200 and 300.  IV Integrilin was given.  Final orthogonal angiograms reveal less than 20% residual stenosis in the ostium of  the first diagonal with no evidence of dissection or thrombus. At this point, we elected to conclude the procedure.  All balloons, wires, and catheters were removed.  Hemostatic sheaths were sewn in place and the patient was transferred back to the ward in stable condition to continue with the Integrilin infusion to complete 18 hours.  CONCLUSIONS: 1. Successful Cutting Balloon atherectomy of the first diagonal ostial    stenotic lesion. 2. Widely patent mid right coronary artery stent. DD:  11/19/00 TD:  11/20/00 Job: [id number]

## 2010-10-24 NOTE — Discharge Summary (Signed)
[place]. [facility]  Patient:    Jonathan Shepard, Jonathan Shepard                    MRN: [id number] Adm. Date:  [id number] Disc. Date: [id number] Attending:  Ophelia Shoulder Dictator:   Raymon Mutton, P.A.                           Discharge Summary  DATE OF BIRTH:  Jan 09, 2051  DISCHARGE DIAGNOSES: 1. Chest pain syndrome, etiology undetermined. 2. Coronary artery disease, status post percutaneous transluminal coronary    angioplasty and stent to the right coronary artery on November 16, 2000, and    status post cutting balloon angioplasty to the first diagonal artery on    November 19, 2000. 3. Systemic hypertension. 4. Hyperlipidemia. 5. Nonsustained ventricular tachycardia. 6. Borderline potassium, repleted.  ______ performed by Madaline Savage, M.D., on November 06, 2000.  HOSPITAL COMPLICATIONS:  None.  HOSPITAL CONSULTS:  None.  HISTORY OF PRESENT ILLNESS:  A 60 year old Caucasian gentleman with a history of coronary artery disease with PTCA and stent to the RCA on November 16, 2000, and PTCA to the first diagonal RCA on November 19, 2000, with an ejection fraction of 60%.  He presented to the emergency room with complaints of chest pain that continued since the night prior to presentation.  This pain felt like pressure and the patient also experienced some shortness of breath.  He said that the day prior to this episode, he ate dinner after he got back home from work, went outside for a walk, and after he came back home, developed chest pain. Also, he mentioned that this pain he has been experienced since the first coronary intervention when the stent to the RCA was placed and has now improved.  The patient was put on Imdur 60 mg OP.  His night symptoms of chest pain became better and he does not experience it any more, but he still has some exertional symptoms of chest pain and shortness of breath.  HOSPITAL COURSE:  He was admitted to the telemetry unit and started  on morphine 2-4 mg IV q.1-2h. p.r.n.  He was also started on nitroglycerin IV and heparin IV per pharmacy protocol.  He was scheduled for cardiac catheterization the next morning.  All three sets of his cardiac enzymes were negative and the troponins stayed in the range of 0.01, which indicated no myocardial injury.  The white blood cell count was 5.8, hemoglobin 12.8, hematocrit 36.8, and platelets 159.  His potassium was 4.1, creatinine 1.2, and BUN 15.  The chest x-ray did not reveal any acute changes, no active disease, and the heart size was shown to be in the upper limits of normal. The EKG showed a normal sinus rhythm and nonspecific ST-T wave changes.  The abdominal ultrasound from this hospital admission did not show any evidence of acute cholecystitis or gallbladder stones and did not show any fatty liver. The patient was taken to the catheterization lab on December 08, 2000, and Madaline Savage, M.D., performed coronary angiography.  He had normal left ventricular systolic function, angiographic patency of mid right coronary artery stent, and patency of the ostial diagonal branch at the left anterior descending at an old angioplasty site.  No other coronary artery disease was found to explain the patients chest pain symptoms.  At the time of discharge, the patient was stable.  His physical exam showed vital  signs with blood pressure 120/60, pulse 78, and respirations 20.  His lungs were clear to auscultation bilaterally.  Heart with regular rate and rhythm and no murmurs, rubs, or gallops.  The abdomen was soft, nontender, and nondistended. Extremities with no edema and 2+ dorsalis pedis pulses bilaterally.  His potassium at the time of admission was borderline at 3.6.  It was repleted with potassium chloride before the discharge.  DISCHARGE MEDICATIONS: 1. Aspirin 325 mg q.d. 2. Plavix 75 mg q.d. 3. Toprol XL 50 mg q.d. 4. Zocor 20 mg q.d. 5. Altace 1.25 mg q.d. 6. Nexium 40  mg q.d. 7. Imdur 30 mg q.d.  DISCHARGE ACTIVITY:  No driving, no heavy lifting, no sexual activity, and no strenuous activity for three days after the cardiac catheterization.  DISCHARGE DIET:  Low-fat, low-salt, low-cholesterol diet.  WOUND CARE:  The patient was instructed to report any signs of bleeding, increased pain, swelling, or any other symptoms to [phone], which is our office phone number.  Also, he was instructed to observe the site daily and make sure there are no signs of infection.  After shower, pad the area dry without rubbing.  DISCHARGE FOLLOW-UP:  The patient was informed to follow up with his gastroenterologist to rule out GI abnormalities because of his chest pain syndrome. DD:  12/16/00 TD:  12/17/00 Job: [id number]

## 2010-10-24 NOTE — Cardiovascular Report (Signed)
[place]. [facility]  Patient:    Jonathan Shepard, Jonathan Shepard                    MRN: [id number] Proc. Date: 12/06/00 Adm. Date:  [id number] Attending:  Ophelia Shoulder CC:         Karleen Hampshire, M.D., [place]  Cardiac Catheterization Laboratory   Cardiac Catheterization  PROCEDURES PERFORMED: 1. Selective coronary angiography by Judkins technique. 2. Retrograde left heart catheterization. 3. Left ventricular angiography.  COMPLICATIONS:  None.  ENTRY SITE:  Right femoral.  DYE USED:  Omnipaque.  PATIENT PROFILE:  The patient is a 60 year old gentleman from [place], who underwent right coronary artery stenting November 16, 2000, Cutting Balloon angioplasty of a diagonal branch of the LAD on November 19, 2000, and initially did well.  He presented to the hospital three days ago with concerns about chest pain.  He was transferred from [place] to here.  His cardiac enzymes and ECGs have been negative.  He enters the catheterization lab today on an inpatient basis electively for this study.  RESULTS:  The left main coronary artery is normal.  The left circumflex coronary artery contains some calcification in the midportion of the vessel but no obstructive lesion.  The left circumflex gives rise to a large first obtuse marginal branch and a second obtuse marginal branch.  It also gives rise to a large bifurcating atrial circumflex branch and no lesions in the circumflex system appear diseased.  The LAD is normal.  The ostium of the first diagonal branch which is best seen in a 40 degree LAO, 35 degree caudally angulated view, shows a basically normal angioplasty site at the ostium and no other lesions to speak of.  The right coronary artery contains a radiopaque stent in the midportion of the vessel.  No lesions are seen.  The left ventricle contracts normally. Ejection fraction estimate 60%.  FINAL DIAGNOSES: 1. Angiographic patency of mid  right coronary artery stent. 2. Angiographic patency of the ostial diagonal branch at the left anterior    descending at an old angioplasty site. 3. Normal left ventricular systolic function. 4. No other disease to explain the patients chest pain symptoms. DD:  12/06/00 TD:  12/06/00 Job: [id number]

## 2010-10-24 NOTE — Op Note (Signed)
[facility]  Patient:    Jonathan Shepard, Jonathan Shepard Visit Number: [id number] MRN: [id number]          Service Type: END Location: DAY Attending Physician:  Jonathon Bellows Dictated by:   Tana Coast, P.A. Admit Date:  03/08/2001 Discharge Date: 03/08/2001   CC:         Karleen Hampshire, M.D.   Operative Report  PRIMARY CARE PHYSICIAN:  Karleen Hampshire, M.D.  PROCEDURE:  Esophageal manometry.  INDICATION:  Noncardiac chest discomfort, unresponsive to high-dose proton pump inhibitor.  PRIOR STUDIES:  EGD in August 2002 by Dr. Roetta Sessions revealed a couple of superficial antral erosions of unclear significance.  H. pylori serologies were negative.  Patient had a HIDA scan which was normal with an ejection fraction of 63%.  Abdominal ultrasound revealed fatty liver but otherwise normal.  METHOD:  The esophageal manometry study was performed with [employer] Gastrosoft Upper Gastrointestinal Polygram, version 6.40, using an Arndorfer infusion system at 0.5 cc/min. The lower esophageal sphincter (LES) was identified in four ports with the standard station pullthrough technique. For esophageal body pressures, the tip of the catheter was placed 3 cm above the proximal aspect of the LES. Ten wet swallows were taken with ports 5 cm apart in the esophageal body. This procedure was reviewed with the patient prior to performing it.  FINDINGS:  Esophageal body:  13 cm     8 cm      3 cm       Mean (distal 2 ports) Amplitude (mmHg)                   (38-102)  (49-131)  (64-154)   (59-139) Patient:            27.3      65.0      59.3       62.3  Duration (sec)                   (3-5)     (3-5)     (2.5-3.5)   (3.5) Patient:           3.9       5.2         4.9       5.1  Contractions (with wet swallows):  One hundred percent peristaltic.    Lower esophageal sphincter (LES):  Located at 45-50 cm (from nares) Resting pressure: 9.0 mmHg Relaxation:   Adequate  IMPRESSION:  Essentially normal esophageal manometry study.  RECOMMENDATION:  Please see recommendations from pH study report. Dictated by:   Tana Coast, P.A. Attending Physician:  Jonathon Bellows DD:  03/17/01 TD:  03/18/01 Job: [id number]

## 2010-10-24 NOTE — Discharge Summary (Signed)
Jonathan Shepard, Jonathan Shepard             ACCOUNT NO.:  [id number]   MEDICAL RECORD NO.:  [id number]          PATIENT TYPE:  INP   LOCATION:  A322                          FACILITY:  [facility]   PHYSICIAN:  Patrica Duel, M.D.    DATE OF BIRTH:  Jul 09, 1950   DATE OF ADMISSION:  08/14/2004  DATE OF DISCHARGE:  03/13/2006LH                                 DISCHARGE SUMMARY   DISCHARGE DIAGNOSES:  1.  Small-bowel obstruction, resolved.  2.  Possible transition zone in the mid-to-distal ileum and questionable      soft tissue mass; workup negative.  3.  Mild transaminitis, transient, resolved, viral markers negative.  4.  Coronary artery disease.  5.  Gastroesophageal reflux disease.  6.  Hypertension.  7.  Hyperlipidemia.  8.  Diabetes mellitus, new diagnosis.   DETAILS REGARDING ADMISSION:  Please refer to the admitting H&P.  Briefly,  this 60 year old male with history as noted above, presented to the office  with a 2-day history of abdominal pain with diarrhea, vomiting, and  bloating.  He was acutely ill and sent to the emergency department for  evaluation.  He was found to have a small-bowel obstruction and was admitted  for further evaluation and therapy.  He also was noted to have mild  transaminitis.   COURSE IN THE HOSPITAL:  The patient was treated symptomatically.  His  symptoms had resolved spontaneously.  Small-bowel follow through was  obtained and it was benign.  His symptoms resolved spontaneously.  His blood  sugars remained normal with conservative management.  He required minimal  insulin coverage.  He was stable for discharge on the fourth hospital day to  be followed as an outpatient.   DISPOSITION:  Medications include:  1.  Valtrex 1 gm b.i.d. and Lidoderm patch (for shingles).  2.  He is to continue aspirin.  3.  Toprol 50 daily.  4.  Zocor 4 daily,  5.  Altace 1.25 daily.  6.  Protonix 40 b.i.d.  7.  Niaspan 500 daily.  8.  Osteo Biflex.   To be followed and  treated expectantly as an outpatient.      MC/MEDQ  D:  09/05/2004  T:  09/05/2004  Job:  [id number]

## 2010-10-24 NOTE — Discharge Summary (Signed)
[place]. [facility]  Patient:    Jonathan Shepard, Jonathan Shepard                    MRN: [id number] Adm. Date:  [id number] Disc. Date: [id number] Attending:  Ophelia Shoulder Dictator:   Mancel Bale, P.A. CC:         Madaline Savage, M.D.   Discharge Summary  ADMISSION DIAGNOSES: 1. Unstable angina. 2. History of coronary artery disease with history of stent to the right    coronary artery. 3. Hypertension.  DISCHARGE DIAGNOSES: 1. Unstable angina. 2. History of coronary artery disease with history of stent to the right    coronary artery. 3. Hypertension. 4. Status post cardiac catheterization on November 19, 2000 by Dr. Lenise Herald,    with cutting balloon angioplasty to the diagonal vessel going from 90%    stenosis to 20% residual.  HISTORY OF PRESENT ILLNESS:  The patient is a 60 year old white married male with a history of recent stent to the RCA on REPLACE II study.  It is a really an 8 beat slow ventricular tachycardia with normal potassium of 3.7.  He was discharged home and did well until Wednesday, June 13, at which time he had chest aching after medications.  They did not go away until after taking nitroglycerin.  He then came to our office.  He had no EKG changes.  He did have mild relief with nitroglycerin there in the office, but then the pain returned.  As well, he had shortness of breath with exertion.  PHYSICAL EXAMINATION:  On exam at that time, his blood pressure was 110/80, heart rate 56.  The rest of exam was benign.  EKG showed no change.  He was planned for admission to check serial enzymes, rule out MI.  He was placed on IV heparin, IV nitroglycerin as well as aspirin and plavix.  He was planned for cardiac cath.  HOSPITAL COURSE:  On November 19, 2000, the patient underwent cardiac catheterization by Dr. Lenise Herald.  Please see dictated cath report for further details.  Dr. Jenne Campus proceeded with accessible cutting  balloon atherectomy of the first diagonal ostial stenotic lesion.  He was also found to have a widely patent mid RCA stent.  Plan to continue Integrilin for 18 hours postprocedure.  Plan to continue Imdur 30 and Plavix.  On November 20, 2000, the patient was doing very well.  He had no further chest pain or shortness of breath.  He was afebrile, 97.1, blood pressure 100/52, pulse 72.  Labs were all stable, creatinine was stable postdye.  Cardiac enzymes remained negative.  His groin was well healed with no hematoma and good distal pulses.  He was seen and evaluated by Dr. Lenise Herald at that point, who deemed his stable for discharge home.  HOSPITAL CONSULTS:  None.  HOSPITAL PROCEDURES:  Cardiac catheterization on November 19, 2000, by Dr. Lenise Herald.  The patient was found to have the following:  Left main, no significant disease.  LAD had a 50% stenosis at the takeoff of the first diagonal.  There was a 50% mid-vessel lesion.  There was a 40% distal lesion. First diagonal was large with a 90% ostial lesion.  Second diagonal, no significant disease.  Left circumflex in the AV groove had no significant disease.  First OM, no significant disease.  Second OM, no significant disease.  Third OM, no significant disease.  Right coronary artery dominant gave rise to PDA  and PLA.  There was a stent in the mid segment of the RCA, which was mildly patent.  A 40% stenotic lesion just prior to the beginning of the stent.  PDA and PLA, no significant disease.  LV-gram was not performed. Dr. Jenne Campus then proceeded with successful cutting ballon atherectomy as far as diagonal osteostenotic lesion.  This went from 90% stenosis to 20% residual.  LABORATORY DATA:  Cardiac enzymes are negative with CKs of 48 and 35, MB 1.0, 0.3, troponin 0.17 and 0.23.  Electrolytes are normal.  On November 19, 2000, sodium 136, potassium 3.7, CO 106, CO2 24, glucose 90, BUN 16, creatinine 1.0. On November 20, 2000, sodium 140,  potassium 3.8, chloride 105, CO2 27, BUN 15, creatinine 1.1, glucose 94.  On November 19, 2000, PT was 12.4, INR 0.9, PTT 29. On November 19, 2000, WBC 8.3, hemoglobin 14.8, hematocrit 43.1, platelets 191. On November 20, 2000, WBC 8.1, hemoglobin 13.7, hematocrit 39.2 and platelets 182.  EKG on admission showed sinus rhythm with no STT change.  DISCHARGE MEDICATIONS: 1. Enteric-coated aspirin 325 mg once a day. 2. Plavix 75 mg once a day for one month. 3. Altace 1.25 mg once a day. 4. Toprol XL 50 mg once a day. 5. Zocor 20 mg one each night. 6. Nexium 40 mg once a day. 7. Nitroglycerin 0.4 mg sublingual as directed for chest discomfort.  ACTIVITY:  Do not lift greater than 5 pounds, driving or sexual activity for three days.  DIET:  Low-fat, low-salt, low-cholesterol diet.  DISCHARGE INSTRUCTIONS:  Call the office at [phone] if any ______ or tender groin.  May wash and shower with warm water and soap.  FOLLOWUP:  Was given an appointment to follow up with Dr. Elsie Lincoln in [place] on July 2. DD:  12/23/00 TD:  12/25/00 Job: [id number]

## 2011-03-16 LAB — CARDIAC PANEL(CRET KIN+CKTOT+MB+TROPI)
Relative Index: INVALID
Total CK: 52
Total CK: 66
Troponin I: 0.07 — ABNORMAL HIGH

## 2011-03-16 LAB — BASIC METABOLIC PANEL
BUN: 11
CO2: 26
Calcium: 9.2
Creatinine, Ser: 0.92
GFR calc non Af Amer: >60
Glucose, Bld: 108 — ABNORMAL HIGH

## 2011-03-16 LAB — CBC
HCT: 42.5
Hemoglobin: 14.5
MCHC: 34.1
MCV: 88.7
RBC: 4.79

## 2012-10-20 ENCOUNTER — Encounter: Payer: Self-pay | Admitting: *Deleted

## 2012-10-20 ENCOUNTER — Other Ambulatory Visit: Payer: Self-pay | Admitting: *Deleted

## 2012-10-20 MED ORDER — RAMIPRIL 5 MG PO CAPS
5.0000 mg | ORAL_CAPSULE | Freq: Every day | ORAL | Status: DC
Start: 2012-10-20 — End: 2012-12-30

## 2012-11-02 ENCOUNTER — Telehealth: Payer: Self-pay | Admitting: *Deleted

## 2012-11-02 NOTE — Telephone Encounter (Signed)
Pt. Called c/o elevated blood sugar levels since increasing Niaspan to 1500mg  QHS, cannot get below 200 reading.  Discussed w/Dr. C. He wants pt. To decrease back to 1000mg  QHS due to elevated blood sugars.

## 2012-11-28 ENCOUNTER — Other Ambulatory Visit: Payer: Self-pay

## 2012-11-28 MED ORDER — NIACIN ER (ANTIHYPERLIPIDEMIC) 1000 MG PO TBCR: 1000.0000 mg | EXTENDED_RELEASE_TABLET | Freq: Every day | ORAL | Status: DC

## 2012-11-28 NOTE — Telephone Encounter (Signed)
Rx was sent to pharmacy electronically. 

## 2012-12-06 ENCOUNTER — Telehealth: Payer: Self-pay | Admitting: Cardiovascular Disease

## 2012-12-06 NOTE — Telephone Encounter (Signed)
Returned call.  Pt stated he went for a morning walk.  Stated he noticed a tightness in his neck, arm numbness and head hurting.  Pt asked if he should be concerned.   Asked pt if he has had similar symptoms before and pt stated he had similar symptoms when he had blockage ~ 8 years ago.  Pt stated the symptoms started as he was going up the incline on his walk and symptoms eased off as he leveled off the incline.  Pt stated he walked for ~ 20 mins.  Currently denies CP, SOB, numbness or tingling.  C/o HA (2/10 on pain scale) and a little dizziness which he states he always has and is not different than usual.  Stated he thinks the dizziness is r/t his meds.  Stated BP was 148/85 HR 98 when he got back home and 152/94 HR 99 ~ 5 mins ago.  Pt informed RN will discuss with MD/PA for further instructions.  Pt verbalized understanding and agreed w/ plan.  Message forwarded to B. Leron Croak, PA-C for further instructions.  Last OV note on cart w/ this note.  ( patient)

## 2012-12-06 NOTE — Telephone Encounter (Signed)
Went walking this morning-feels somewhat  Like he did when he had his first blockage! Tightness in his throat,left arm feels a little numb and head is hurting!

## 2012-12-06 NOTE — Telephone Encounter (Signed)
Put him on the schedule for this week.  If symptoms persist at rest, he needs to go to the ER.

## 2012-12-06 NOTE — Telephone Encounter (Signed)
Returned call and informed pt per instructions by MD/PA.  Pt verbalized understanding and agreed w/ plan.  Appt scheduled for tomorrow at 4pm w/ L. Kilroy, PA-C.  Pt advised to come in a little early if he can and if he can be seen sooner he will be called back sooner.  Pt verbalized understanding and agreed w/ plan.

## 2012-12-07 ENCOUNTER — Ambulatory Visit (INDEPENDENT_AMBULATORY_CARE_PROVIDER_SITE_OTHER): Payer: BC Managed Care – PPO | Admitting: Cardiology

## 2012-12-07 ENCOUNTER — Encounter: Payer: Self-pay | Admitting: Cardiology

## 2012-12-07 VITALS — BP 130/82 | HR 90 | Ht 72.0 in | Wt 217.9 lb

## 2012-12-07 DIAGNOSIS — E785 Hyperlipidemia, unspecified: Secondary | ICD-10-CM | POA: Insufficient documentation

## 2012-12-07 DIAGNOSIS — R079 Chest pain, unspecified: Secondary | ICD-10-CM | POA: Insufficient documentation

## 2012-12-07 DIAGNOSIS — I2510 Atherosclerotic heart disease of native coronary artery without angina pectoris: Secondary | ICD-10-CM

## 2012-12-07 DIAGNOSIS — Z8249 Family history of ischemic heart disease and other diseases of the circulatory system: Secondary | ICD-10-CM

## 2012-12-07 DIAGNOSIS — Z95820 Peripheral vascular angioplasty status with implants and grafts: Secondary | ICD-10-CM | POA: Insufficient documentation

## 2012-12-07 DIAGNOSIS — E119 Type 2 diabetes mellitus without complications: Secondary | ICD-10-CM | POA: Insufficient documentation

## 2012-12-07 DIAGNOSIS — Z9889 Other specified postprocedural states: Secondary | ICD-10-CM

## 2012-12-07 MED ORDER — ISOSORBIDE MONONITRATE ER 30 MG PO TB24: 15.0000 mg | ORAL_TABLET | Freq: Every day | ORAL | Status: DC

## 2012-12-07 MED ORDER — NITROGLYCERIN 0.4 MG SL SUBL: 0.4000 mg | SUBLINGUAL_TABLET | SUBLINGUAL | Status: DC | PRN

## 2012-12-07 NOTE — Progress Notes (Signed)
12/07/2012 Jonathan Shepard   03/18/1951  [id number]  Primary Physicia Kirk Ruths, MD Primary Cardiologist: Dr Royann Shivers  HPI:  The patient is a 62 year old gentleman with a history of coronary disease, but without any events since December of 2008. He had an RCA and OD PCI in June 2002. He had ISR May 2008 and again 12/08. At that time, he received a drug-eluting stent to the proximal LAD artery as well. He has systemic hypertension, type 2 diabetes mellitus, and dyslipidemia, all of which are very well controlled. He had a low risk Myoview in 2011. He is seen today as an add on. He relates a history of exertional throat discomfort and Lt arm numbness as well as SOB while walking up an incline. His symptoms were relieved with rest. He did some strenuous activity the next day, unloading shingles, and had no pain. He admits his symptoms are similar to his previous anginal symptoms.    Current Outpatient Prescriptions  Medication Sig Dispense Refill  . aspirin EC 81 MG tablet Take 81 mg by mouth daily.      . clopidogrel (PLAVIX) 75 MG tablet Take 75 mg by mouth daily.      . cyanocobalamin (,VITAMIN B-12,) 1000 MCG/ML injection Inject 1,000 mcg into the muscle every 30 (thirty) days.      Marland Kitchen escitalopram (LEXAPRO) 10 MG tablet Take 10 mg by mouth 2 (two) times daily.      Marland Kitchen HYDROCODONE BITARTRATE ER PO Take by mouth as needed. twice a day      . linagliptin (TRADJENTA) 5 MG TABS tablet Take 5 mg by mouth daily.      . metFORMIN (GLUCOPHAGE) 1000 MG tablet Take 500 mg by mouth 2 (two) times daily with a meal. Takes two tablets.      . metoprolol succinate (TOPROL-XL) 50 MG 24 hr tablet Take 50 mg by mouth daily. Take with or immediately following a meal.      . Multiple Vitamin (MULTIVITAMIN) tablet Take 1 tablet by mouth daily.      . niacin (NIASPAN) 1000 MG CR tablet Take 1 tablet (1,000 mg total) by mouth at bedtime.  90 tablet  2  . nitroGLYCERIN (NITROSTAT) 0.4 MG SL tablet Place 0.4 mg  under the tongue every 5 (five) minutes as needed for chest pain.      . Omega-3 Fatty Acids (FISH OIL) 1200 MG CAPS Take 1 capsule by mouth 2 (two) times daily.      . pravastatin (PRAVACHOL) 40 MG tablet Take 40 mg by mouth daily.      . ramipril (ALTACE) 5 MG capsule Take 1 capsule (5 mg total) by mouth daily.  90 capsule  3  . isosorbide mononitrate (IMDUR) 30 MG 24 hr tablet Take 0.5 tablets (15 mg total) by mouth daily.  30 tablet  3  . nitroGLYCERIN (NITROSTAT) 0.4 MG SL tablet Place 1 tablet (0.4 mg total) under the tongue every 5 (five) minutes as needed for chest pain.  90 tablet  3   No current facility-administered medications for this visit.    No Known Allergies  History   Social History  . Marital Status: Married    Spouse Name: N/A    Number of Children: N/A  . Years of Education: N/A   Occupational History  . Not on file.   Social History Main Topics  . Smoking status: Never Smoker   . Smokeless tobacco: Not on file  . Alcohol Use: Not on file  .  Drug Use: Not on file  . Sexually Active: Not on file   Other Topics Concern  . Not on file   Social History Narrative   Has 3 children   Has 2 grandchildren     Review of Systems: General: negative for chills, fever, night sweats or weight changes.  Dermatological: negative for rash Respiratory: negative for cough or wheezing Urologic: negative for hematuria Abdominal: negative for nausea, vomiting, diarrhea, bright red blood per rectum, melena, or hematemesis Neurologic: negative for visual changes, syncope, or dizziness All other systems reviewed and are otherwise negative except as noted above.    Blood pressure 130/82, pulse 90, height 6' (1.829 m), weight 217 lb 14.4 oz (98.839 kg).  General appearance: alert, cooperative, appears stated age, no distress and mildly obese Neck: no carotid bruit and no JVD Lungs: clear to auscultation bilaterally Heart: regular rate and rhythm, S1, S2 normal, no  murmur, click, rub or gallop, regular rate and rhythm and 2/6 systolic murmur AOV area Abdomen: soft, non-tender; bowel sounds normal; no masses,  no organomegaly Extremities: extremities normal, atraumatic, no cyanosis or edema Pulses: 2+ and symmetric Skin: Skin color, texture, turgor normal. No rashes or lesions Neurologic: Grossly normal  EKG  EKG: normal EKG, normal sinus rhythm, unchanged from previous tracings.  ASSESSMENT AND PLAN:   Chest pain with moderate risk for cardiac etiology .  S/P angioplasty with stent-'02, '08. Low risk Myoview 2011 .  Diabetes mellitus .  Dyslipidemia .  Family history of coronary artery disease .   PLAN  I discussed Jonathan Shepard's case with Dr Rennis Golden. We have decided to proceed with an exercise Myoview next week. We added low dose Imdur and renewed his NTG. He will follow up with Dr Royann Shivers after his stress.   Bailey Kolbe KPA-C 12/07/2012 4:15 PM

## 2012-12-07 NOTE — Patient Instructions (Signed)
Your physician has requested that you have en exercise stress myoview. For further information please visit [email or web address] Please follow instruction sheet, as given.  Your physician recommends that you schedule a follow-up appointment with Dr. Royann Shivers following test.

## 2012-12-14 ENCOUNTER — Encounter: Payer: Self-pay | Admitting: Cardiology

## 2012-12-15 ENCOUNTER — Ambulatory Visit (HOSPITAL_COMMUNITY)
Admission: RE | Admit: 2012-12-15 | Discharge: 2012-12-15 | Disposition: A | Discharge status: Home / Self Care | Payer: BC Managed Care – PPO | Source: Ambulatory Visit | Attending: Cardiovascular Disease | Admitting: Cardiovascular Disease

## 2012-12-15 DIAGNOSIS — R42 Dizziness and giddiness: Secondary | ICD-10-CM | POA: Insufficient documentation

## 2012-12-15 DIAGNOSIS — E669 Obesity, unspecified: Secondary | ICD-10-CM | POA: Insufficient documentation

## 2012-12-15 DIAGNOSIS — R079 Chest pain, unspecified: Secondary | ICD-10-CM | POA: Insufficient documentation

## 2012-12-15 DIAGNOSIS — R002 Palpitations: Secondary | ICD-10-CM | POA: Insufficient documentation

## 2012-12-15 DIAGNOSIS — I10 Essential (primary) hypertension: Secondary | ICD-10-CM | POA: Insufficient documentation

## 2012-12-15 DIAGNOSIS — Z8249 Family history of ischemic heart disease and other diseases of the circulatory system: Secondary | ICD-10-CM | POA: Insufficient documentation

## 2012-12-15 DIAGNOSIS — R55 Syncope and collapse: Secondary | ICD-10-CM | POA: Insufficient documentation

## 2012-12-15 DIAGNOSIS — R0989 Other specified symptoms and signs involving the circulatory and respiratory systems: Secondary | ICD-10-CM | POA: Insufficient documentation

## 2012-12-15 DIAGNOSIS — E119 Type 2 diabetes mellitus without complications: Secondary | ICD-10-CM | POA: Insufficient documentation

## 2012-12-15 DIAGNOSIS — R0609 Other forms of dyspnea: Secondary | ICD-10-CM | POA: Insufficient documentation

## 2012-12-15 DIAGNOSIS — I2510 Atherosclerotic heart disease of native coronary artery without angina pectoris: Secondary | ICD-10-CM

## 2012-12-15 MED ORDER — TECHNETIUM TC 99M SESTAMIBI GENERIC - CARDIOLITE
10.6000 | Freq: Once | INTRAVENOUS | Status: AC | PRN
  Administered 2012-12-15: 11 via INTRAVENOUS

## 2012-12-15 MED ORDER — TECHNETIUM TC 99M SESTAMIBI GENERIC - CARDIOLITE
30.9000 | Freq: Once | INTRAVENOUS | Status: AC | PRN
  Administered 2012-12-15: 30.9 via INTRAVENOUS

## 2012-12-15 NOTE — Procedures (Addendum)
[facility] [address] [place] [address] [phone]  Cardiology Nuclear Med Study  Jonathan Shepard is a 62 y.o. male     MRN : [id number]     DOB: 1950/06/29  Procedure Date: 12/15/2012  Nuclear Med Background Indication for Stress Test:  Evaluation for Ischemia and Stent Patency History:  CAD;STENT/PTCA--2002 AND 2008 Cardiac Risk Factors: Family History - CAD, Hypertension, Lipids, NIDDM and Obesity  Symptoms:  Chest Pain, Dizziness, DOE, Light-Headedness, Near Syncope and Palpitations   Nuclear Pre-Procedure Caffeine/Decaff Intake:  1:00am NPO After: 11AM   IV Site: R Hand  IV 0.9% NS with Angio Cath:  22g  Chest Size (in):  46"  IV Started by: Emmit Pomfret, RN  Height: 6' (1.829 m)  Cup Size: n/a  BMI:  Body mass index is 29.42 kg/(m^2). Weight:  217 lb (98.431 kg)   Tech Comments:  N/A    Nuclear Med Study 1 or 2 day study: 1 day  Stress Test Type:  Stress  Order Authorizing Provider:  Zoila Shutter, MD   Resting Radionuclide: Technetium 75m Sestamibi  Resting Radionuclide Dose: 10.6 mCi   Stress Radionuclide:  Technetium 74m Sestamibi  Stress Radionuclide Dose: 30.9 mCi           Stress Protocol Rest HR: 85 Stress HR:  150  Rest BP: 117/90 Stress BP: 150/83  Exercise Time (min): 6:30 METS: 7.8   Predicted Max HR: 161 bpm % Max HR: 93.17 bpm Rate Pressure Product: 27253  Dose of Adenosine (mg):  n/a Dose of Lexiscan: n/a mg  Dose of Atropine (mg): n/a Dose of Dobutamine: n/a mcg/kg/min (at max HR)  Stress Test Technologist: Esperanza Sheets, CCT Nuclear Technologist: Gonzella Lex, CNMT   Rest Procedure:  Myocardial perfusion imaging was performed at rest 45 minutes following the intravenous administration of Technetium 44m Sestamibi. Stress Procedure:  The patient performed treadmill exercise using a Bruce  Protocol for 6:30 minutes. The patient stopped due to SOB and leg fatigue and denied  any chest pain.  There were no significant ST-T wave changes.  Technetium 26m Sestamibi was injected at peak exercise and myocardial perfusion imaging was performed after a brief delay.  Transient Ischemic Dilatation (Normal <1.22):  1.04 Lung/Heart Ratio (Normal <0.45):  0.37 QGS EDV:  83 ml QGS ESV:  38 ml LV Ejection Fraction: 54%  Signed by    Rest ECG: NSR - Normal EKG  Stress ECG: No significant change from baseline ECG  QPS Raw Data Images:  Normal; no motion artifact; normal heart/lung ratio. Stress Images:  Normal homogeneous uptake in all areas of the myocardium. Rest Images:  Normal homogeneous uptake in all areas of the myocardium. Subtraction (SDS):  No evidence of ischemia.  Impression Exercise Capacity:  Good exercise capacity. BP Response:  Normal blood pressure response. Clinical Symptoms:  No significant symptoms noted. ECG Impression:  No significant ST segment change suggestive of ischemia. Comparison with Prior Nuclear Study: No significant change from previous study  Overall Impression:  Normal stress nuclear study.  LV Wall Motion:  NL LV Function; NL Wall Motion   Runell Gess, MD  12/15/2012 5:29 PM

## 2012-12-19 ENCOUNTER — Telehealth: Payer: Self-pay | Admitting: *Deleted

## 2012-12-19 NOTE — Telephone Encounter (Signed)
Called patient of normal myoview stress test results 12/19/12

## 2012-12-30 ENCOUNTER — Ambulatory Visit (INDEPENDENT_AMBULATORY_CARE_PROVIDER_SITE_OTHER): Payer: BC Managed Care – PPO | Admitting: Cardiovascular Disease

## 2012-12-30 ENCOUNTER — Encounter: Payer: Self-pay | Admitting: Cardiovascular Disease

## 2012-12-30 VITALS — BP 118/68 | HR 76 | Ht 72.0 in | Wt 221.5 lb

## 2012-12-30 DIAGNOSIS — E119 Type 2 diabetes mellitus without complications: Secondary | ICD-10-CM

## 2012-12-30 DIAGNOSIS — E785 Hyperlipidemia, unspecified: Secondary | ICD-10-CM

## 2012-12-30 DIAGNOSIS — I2510 Atherosclerotic heart disease of native coronary artery without angina pectoris: Secondary | ICD-10-CM

## 2012-12-30 MED ORDER — RAMIPRIL 2.5 MG PO CAPS: 2.5000 mg | ORAL_CAPSULE | Freq: Every day | ORAL | Status: DC

## 2012-12-30 MED ORDER — METOPROLOL SUCCINATE ER 50 MG PO TB24: 50.0000 mg | ORAL_TABLET | Freq: Every day | ORAL | Status: DC

## 2012-12-30 NOTE — Patient Instructions (Addendum)
Your physician recommends that you schedule a follow-up appointment in: one month   Decrease Ramipril to 2. 5mg  daily.

## 2013-01-01 NOTE — Progress Notes (Signed)
Patient ID: Jonathan Shepard, male   DOB: 08-20-1950, 62 y.o.   MRN: [id number]     Reason for office visit Followup nuclear stress test  He relates a history of exertional throat discomfort and Lt arm numbness as well as SOB while walking up an incline. His symptoms were relieved with rest. He did some strenuous activity the next day, unloading shingles, and had no pain. He admits his symptoms are similar to his previous anginal symptoms  He has had two-vessel coronary artery disease with stents placed in the right coronary artery and LAD artery, but has not required any repeat coronary angiography since 2008. He has just undergone a nuclear stress test which showed normal perfusion pattern. He has not had any further episodes of chest pain. He has not tried walking up the incline that triggered his symptoms  No Known Allergies  Current Outpatient Prescriptions  Medication Sig Dispense Refill  . aspirin EC 81 MG tablet Take 81 mg by mouth daily.      . clopidogrel (PLAVIX) 75 MG tablet Take 75 mg by mouth daily.      . cyanocobalamin (,VITAMIN B-12,) 1000 MCG/ML injection Inject 1,000 mcg into the muscle every 30 (thirty) days.      . diclofenac (VOLTAREN) 75 MG EC tablet Take 75 mg by mouth 2 (two) times daily.      Marland Kitchen HYDROCODONE BITARTRATE ER PO Take by mouth as needed. twice a day      . linagliptin (TRADJENTA) 5 MG TABS tablet Take 5 mg by mouth daily.      . metFORMIN (GLUCOPHAGE) 1000 MG tablet Take 500 mg by mouth 2 (two) times daily with a meal. Takes two tablets.      . metoprolol succinate (TOPROL-XL) 50 MG 24 hr tablet Take 1 tablet (50 mg total) by mouth daily. Take 1/2  with or immediately following a meal.  90 tablet  3  . Multiple Vitamin (MULTIVITAMIN) tablet Take 1 tablet by mouth daily.      . niacin (NIASPAN) 1000 MG CR tablet Take 1 tablet (1,000 mg total) by mouth at bedtime.  90 tablet  2  . nitroGLYCERIN (NITROSTAT) 0.4 MG SL tablet Place 0.4 mg under the tongue  every 5 (five) minutes as needed for chest pain.      . nitroGLYCERIN (NITROSTAT) 0.4 MG SL tablet Place 1 tablet (0.4 mg total) under the tongue every 5 (five) minutes as needed for chest pain.  90 tablet  3  . Omega-3 Fatty Acids (FISH OIL) 1200 MG CAPS Take 1 capsule by mouth 2 (two) times daily.      . pravastatin (PRAVACHOL) 40 MG tablet Take 40 mg by mouth daily.      . isosorbide mononitrate (IMDUR) 30 MG 24 hr tablet Take 0.5 tablets (15 mg total) by mouth daily.  30 tablet  3  . ramipril (ALTACE) 2.5 MG capsule Take 1 capsule (2.5 mg total) by mouth daily.  90 capsule  3   No current facility-administered medications for this visit.    Past Medical History  Diagnosis Date  . CAD (coronary artery disease)   . Obesity   . Hypertension   . Diabetes   . Hyperlipidemia     No past surgical history on file.  Family History  Problem Relation Age of Onset  . Coronary artery disease Father 93    CABG  . Coronary artery disease Brother 79    CABG    History  Social History  . Marital Status: Married    Spouse Name: N/A    Number of Children: N/A  . Years of Education: N/A   Occupational History  . Not on file.   Social History Main Topics  . Smoking status: Never Smoker   . Smokeless tobacco: Not on file  . Alcohol Use: Not on file  . Drug Use: Not on file  . Sexually Active: Not on file   Other Topics Concern  . Not on file   Social History Narrative   Has 3 children   Has 2 grandchildren    Review of systems: The patient specifically denies any chest pain at rest or with exertion, dyspnea at rest or with exertion, orthopnea, paroxysmal nocturnal dyspnea, syncope, palpitations, focal neurological deficits, intermittent claudication, lower extremity edema, unexplained weight gain, cough, hemoptysis or wheezing.  The patient also denies abdominal pain, nausea, vomiting, dysphagia, diarrhea, constipation, polyuria, polydipsia, dysuria, hematuria, frequency,  urgency, abnormal bleeding or bruising, fever, chills, unexpected weight changes, mood swings, change in skin or hair texture, change in voice quality, auditory or visual problems, allergic reactions or rashes, new musculoskeletal complaints other than usual "aches and pains".   PHYSICAL EXAM BP 118/68  Pulse 76  Ht 6' (1.829 m)  Wt 221 lb 8 oz (100.472 kg)  BMI 30.03 kg/m2  General: Alert, oriented x3, no distress Head: no evidence of trauma, PERRL, EOMI, no exophtalmos or lid lag, no myxedema, no xanthelasma; normal ears, nose and oropharynx Neck: normal jugular venous pulsations and no hepatojugular reflux; brisk carotid pulses without delay and no carotid bruits Chest: clear to auscultation, no signs of consolidation by percussion or palpation, normal fremitus, symmetrical and full respiratory excursions Cardiovascular: normal position and quality of the apical impulse, regular rhythm, normal first and second heart sounds, no murmurs, rubs or gallops Abdomen: no tenderness or distention, no masses by palpation, no abnormal pulsatility or arterial bruits, normal bowel sounds, no hepatosplenomegaly Extremities: no clubbing, cyanosis or edema; 2+ radial, ulnar and brachial pulses bilaterally; 2+ right femoral, posterior tibial and dorsalis pedis pulses; 2+ left femoral, posterior tibial and dorsalis pedis pulses; no subclavian or femoral bruits Neurological: grossly nonfocal   EKG: Sinus rhythm, normal tracing  Lipid Panel April 2014 creatinine 0.99, potassium 4.5, normal liver function tests Cholesterol 169, triglycerides 291, HDL 37, LDL 74   ASSESSMENT AND PLAN CAD (coronary artery disease) No evidence of acute coronary insufficiency a myocardial perfusion study. Preserved left ventricle systolic function. Last coronary angiography was performed in 2008 when he underwent "sandwich stenting" restenosis in the proximal portion of a Liberte stent in the proximal right coronary artery as  well as at de novo stenting in the proximal LAD (drug eluting 3 x 15 mm Promus). Stressed the fact that nuclear perfusion studies are in perfect from a diagnostic standpoint but currently her associated with excellent prognostic value. If she is troubled by angina again I would go directly to coronary angiography. At this point however I think the focus should be on risk factor modification. He has not made sufficient progress with behavioral and lifestyle changes  Diabetes mellitus Reports good control  Dyslipidemia His most recent lipid profile showed total cholesterol 169, HDL 37, LDL 74 but the triglycerides were high at 291. 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 Consider switching to fenofibrate  No orders of the defined types were placed in this encounter.   Meds ordered  this encounter  Medications  . diclofenac (VOLTAREN) 75 MG EC tablet    Sig: Take 75 mg by mouth 2 (two) times daily.  . metoprolol succinate (TOPROL-XL) 50 MG 24 hr tablet    Sig: Take 1 tablet (50 mg total) by mouth daily. Take 1/2  with or immediately following a meal.    Dispense:  90 tablet    Refill:  3  . ramipril (ALTACE) 2.5 MG capsule    Sig: Take 1 capsule (2.5 mg total) by mouth daily.    Dispense:  90 capsule    Refill:  3    Braniya Farrugia  Thurmon Fair, MD, [facility] [phone] office [phone] pager

## 2013-01-01 NOTE — Assessment & Plan Note (Signed)
No evidence of acute coronary insufficiency a myocardial perfusion study. Preserved left ventricle systolic function. Last coronary angiography was performed in 2008 when he underwent "sandwich stenting" restenosis in the proximal portion of a Liberte stent in the proximal right coronary artery as well as at de novo stenting in the proximal LAD (drug eluting 3 x 15 mm Promus). Stressed the fact that nuclear perfusion studies are in perfect from a diagnostic standpoint but currently her associated with excellent prognostic value. If she is troubled by angina again I would go directly to coronary angiography. At this point however I think the focus should be on risk factor modification. He has not made sufficient progress with behavioral and lifestyle changes

## 2013-01-01 NOTE — Assessment & Plan Note (Signed)
Reports good control

## 2013-01-01 NOTE — Assessment & Plan Note (Signed)
His most recent lipid profile showed total cholesterol 169, HDL 37, LDL 74 but the triglycerides were high at 291. 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 Consider switching to fenofibrate

## 2013-01-31 ENCOUNTER — Encounter: Payer: Self-pay | Admitting: Cardiovascular Disease

## 2013-01-31 ENCOUNTER — Ambulatory Visit (INDEPENDENT_AMBULATORY_CARE_PROVIDER_SITE_OTHER): Payer: BC Managed Care – PPO | Admitting: Cardiovascular Disease

## 2013-01-31 VITALS — BP 116/84 | HR 84 | Ht 72.0 in | Wt 219.4 lb

## 2013-01-31 DIAGNOSIS — I2510 Atherosclerotic heart disease of native coronary artery without angina pectoris: Secondary | ICD-10-CM

## 2013-01-31 DIAGNOSIS — E782 Mixed hyperlipidemia: Secondary | ICD-10-CM

## 2013-01-31 DIAGNOSIS — I739 Peripheral vascular disease, unspecified: Secondary | ICD-10-CM | POA: Insufficient documentation

## 2013-01-31 DIAGNOSIS — Z95820 Peripheral vascular angioplasty status with implants and grafts: Secondary | ICD-10-CM

## 2013-01-31 DIAGNOSIS — Z9889 Other specified postprocedural states: Secondary | ICD-10-CM

## 2013-01-31 DIAGNOSIS — E119 Type 2 diabetes mellitus without complications: Secondary | ICD-10-CM

## 2013-01-31 DIAGNOSIS — E785 Hyperlipidemia, unspecified: Secondary | ICD-10-CM

## 2013-01-31 MED ORDER — CHOLINE FENOFIBRATE 45 MG PO CPDR: 45.0000 mg | DELAYED_RELEASE_CAPSULE | Freq: Every day | ORAL | Status: DC

## 2013-01-31 NOTE — Patient Instructions (Addendum)
Your physician recommends that you schedule a follow-up appointment in: 6 months Your physician has recommended you make the following change in your medication: Stop niacin; start fenofibrate 45 mg daily Your physician has requested that you have a lower extremity arterial exercise duplex. During this test, exercise and ultrasound are used to evaluate arterial blood flow in the legs. Allow one hour for this exam. There are no restrictions or special instructions. Your physician recommends that you return for lab work in: 3 months

## 2013-01-31 NOTE — Assessment & Plan Note (Signed)
He reports good control

## 2013-01-31 NOTE — Assessment & Plan Note (Signed)
Check lower extremity arterial duplex

## 2013-01-31 NOTE — Assessment & Plan Note (Addendum)
In 2008 had restenosis in the midright coronary artery stent where he had undergone "sandwich stenting" - had cutting balloon angioplasty. In the same setting received a drug-eluting 3 x 15 mm promus stent in the proximal LAD artery for a de novo lesion. He has no symptoms of angina pectoris despite a reasonably active lifestyle. Have encouraged him to continue his walking so that he can receive early warning if he should develop new coronary lesions. Most of his coronary risk factors are well addressed but he remains markedly overweight, borderline obese. We had another discussion regarding healthful changes in his diet to achieve weight loss.

## 2013-01-31 NOTE — Assessment & Plan Note (Signed)
On 1 g daily of niacin to his triglycerides are still high and when we try to increase his dose to 2 g a day, he developed worsening hyperglycemia. Recent clinical trials also raised doubts about the benefits of niacin. I have recommended that he discontinue this medication. He will continue taking pravastatin and we'll start fenofibrate 45 mg once a day. Recheck lipids in a couple of months

## 2013-01-31 NOTE — Progress Notes (Signed)
Patient ID: OMARR HANN, male   DOB: 1951/03/14, 62 y.o.   MRN: [id number]      Reason for office visit Coronary artery disease risk factor management  Kacin returns for routine followup and has not had any symptoms of angina or other indications of new coronary insufficiency. He is able to perform moderately intense physical pass without cardiovascular complaints. He has more trouble with arthralgias. He denies dyspnea. He has problems with erectile dysfunction with varying degrees of severity. He believes that some of his problems may be psychological rather than physical. Erectile dysfunction medications do help. He complains about the cost of his diabetes medications.  He does not have chest pain, shortness of breath, lower showed edema. She does have intermittent cramping in his left lower extremity, although this is not consistently associated with walking   No Known Allergies  Current Outpatient Prescriptions  Medication Sig Dispense Refill  . aspirin EC 81 MG tablet Take 81 mg by mouth daily.      . clopidogrel (PLAVIX) 75 MG tablet Take 75 mg by mouth daily.      . cyanocobalamin (,VITAMIN B-12,) 1000 MCG/ML injection Inject 1,000 mcg into the muscle every 30 (thirty) days.      . diclofenac (VOLTAREN) 75 MG EC tablet Take 75 mg by mouth 2 (two) times daily.      Marland Kitchen HYDROCODONE BITARTRATE ER PO Take by mouth as needed. twice a day      . isosorbide mononitrate (IMDUR) 30 MG 24 hr tablet Take 0.5 tablets (15 mg total) by mouth daily.  30 tablet  3  . linagliptin (TRADJENTA) 5 MG TABS tablet Take 5 mg by mouth daily.      . metFORMIN (GLUCOPHAGE) 1000 MG tablet Take 500 mg by mouth 2 (two) times daily with a meal. Takes two tablets.      . metoprolol succinate (TOPROL-XL) 50 MG 24 hr tablet Take 1 tablet (50 mg total) by mouth daily. Take 1/2  with or immediately following a meal.  90 tablet  3  . Multiple Vitamin (MULTIVITAMIN) tablet Take 1 tablet by mouth daily.      .  nitroGLYCERIN (NITROSTAT) 0.4 MG SL tablet Place 0.4 mg under the tongue every 5 (five) minutes as needed for chest pain.      . nitroGLYCERIN (NITROSTAT) 0.4 MG SL tablet Place 1 tablet (0.4 mg total) under the tongue every 5 (five) minutes as needed for chest pain.  90 tablet  3  . Omega-3 Fatty Acids (FISH OIL) 1200 MG CAPS Take 1 capsule by mouth 2 (two) times daily.      . pravastatin (PRAVACHOL) 40 MG tablet Take 40 mg by mouth daily.      . ramipril (ALTACE) 2.5 MG capsule Take 1 capsule (2.5 mg total) by mouth daily.  90 capsule  3  . Choline Fenofibrate 45 MG capsule Take 1 capsule (45 mg total) by mouth daily.  90 capsule  3   No current facility-administered medications for this visit.    Past Medical History  Diagnosis Date  . CAD (coronary artery disease)   . Obesity   . Hypertension   . Diabetes   . Hyperlipidemia     No past surgical history on file.  Family History  Problem Relation Age of Onset  . Coronary artery disease Father 66    CABG  . Coronary artery disease Brother 10    CABG    History   Social History  .  Marital Status: Married    Spouse Name: N/A    Number of Children: N/A  . Years of Education: N/A   Occupational History  . Not on file.   Social History Main Topics  . Smoking status: Never Smoker   . Smokeless tobacco: Not on file  . Alcohol Use: Not on file  . Drug Use: Not on file  . Sexual Activity: Not on file   Other Topics Concern  . Not on file   Social History Narrative   Has 3 children   Has 2 grandchildren    Review of systems: Possible intermittent claudication of the left lower extremity. Occasional dizziness.  The patient specifically denies any chest pain at rest or with exertion, dyspnea at rest or with exertion, orthopnea, paroxysmal nocturnal dyspnea, syncope, palpitations, focal neurological deficits,lower extremity edema, unexplained weight gain, cough, hemoptysis or wheezing.  The patient also denies abdominal  pain, nausea, vomiting, dysphagia, diarrhea, constipation, polyuria, polydipsia, dysuria, hematuria, frequency, urgency, abnormal bleeding or bruising, fever, chills, unexpected weight changes, mood swings, change in skin or hair texture, change in voice quality, auditory or visual problems, allergic reactions or rashes, new musculoskeletal complaints other than usual "aches and pains".  PHYSICAL EXAM BP 116/84  Pulse 84  Ht 6' (1.829 m)  Wt 219 lb 6.4 oz (99.519 kg)  BMI 29.75 kg/m2  General: Alert, oriented x3, no distress, borderline obese with prominent ventral adiposity Head: no evidence of trauma, PERRL, EOMI, no exophtalmos or lid lag, no myxedema, no xanthelasma; normal ears, nose and oropharynx Neck: normal jugular venous pulsations and no hepatojugular reflux; brisk carotid pulses without delay and no carotid bruits Chest: clear to auscultation, no signs of consolidation by percussion or palpation, normal fremitus, symmetrical and full respiratory excursions Cardiovascular: normal position and quality of the apical impulse, regular rhythm, normal first and second heart sounds, no murmurs, rubs or gallops Abdomen: no tenderness or distention, no masses by palpation, no abnormal pulsatility or arterial bruits, normal bowel sounds, no hepatosplenomegaly Extremities: no clubbing, cyanosis or edema; 2+ radial, ulnar and brachial pulses bilaterally; 2+ right femoral, posterior tibial and dorsalis pedis pulses; 2+ left femoral, 1+ posterior tibial and dorsalis pedis pulses; no subclavian or femoral bruits Neurological: grossly nonfocal   EKG: Sinus rhythm, normal tracing  Lipid Panel  April 2014 total cholesterol 169, TG was 291, HDL 37, LDL 74  BMET Potassium 4.5, glucose 217, creatinine 0.99 normal TSH and PSA   ASSESSMENT AND PLAN S/P angioplasty with stent-'02, '08. Low risk Myoview 2011 In 2008 had restenosis in the midright coronary artery stent where he had undergone "sandwich  stenting" - had cutting balloon angioplasty. In the same setting received a drug-eluting 3 x 15 mm promus stent in the proximal LAD artery for a de novo lesion. He has no symptoms of angina pectoris despite a reasonably active lifestyle. Have encouraged him to continue his walking so that he can receive early warning if he should develop new coronary lesions. Most of his coronary risk factors are well addressed but he remains markedly overweight, borderline obese. We had another discussion regarding healthful changes in his diet to achieve weight loss.  CAD (coronary artery disease)    Diabetes mellitus He reports good control  Mixed hyperlipidemia On 1 g daily of niacin to his triglycerides are still high and when we try to increase his dose to 2 g a day, he developed worsening hyperglycemia. Recent clinical trials also raised doubts about the benefits of niacin. I  have recommended that he discontinue this medication. He will continue taking pravastatin and we'll start fenofibrate 45 mg once a day. Recheck lipids in a couple of months  Intermittent claudication of left lower showed Check lower extremity arterial duplex  Erectile dysfunction He can use Cialis. Discussed the risks of combining with nitrates. Pelvic artery ultrasound may be of significant value in diagnosis   Orders Placed This Encounter  Procedures  . Lipid Profile  . Comp Met (CMET)  . Lower Extremity Arterial Duplex Bilateral   Meds ordered this encounter  Medications  . Choline Fenofibrate 45 MG capsule    Sig: Take 1 capsule (45 mg total) by mouth daily.    Dispense:  90 capsule    Refill:  3    Necola Bluestein  Thurmon Fair, MD, [facility] [phone] office [phone] pager

## 2013-02-17 ENCOUNTER — Ambulatory Visit (HOSPITAL_COMMUNITY)
Admission: RE | Admit: 2013-02-17 | Discharge: 2013-02-17 | Disposition: A | Discharge status: Home / Self Care | Payer: BC Managed Care – PPO | Source: Ambulatory Visit | Attending: Cardiovascular Disease | Admitting: Cardiovascular Disease

## 2013-02-17 DIAGNOSIS — I739 Peripheral vascular disease, unspecified: Secondary | ICD-10-CM | POA: Insufficient documentation

## 2013-02-17 NOTE — Progress Notes (Signed)
Lower Extremity Arterial Duplex Completed. °Brianna L Mazza,RVT °

## 2013-03-09 ENCOUNTER — Telehealth: Payer: Self-pay | Admitting: *Deleted

## 2013-03-09 NOTE — Telephone Encounter (Signed)
Message copied by Vita Barley on Thu Mar 09, 2013  1:20 PM ------      Message from: Thurmon Fair      Created: Tue Mar 07, 2013  2:28 PM       Lower extremity Dopplers (arterial) were Ok. ------

## 2013-03-09 NOTE — Telephone Encounter (Signed)
LM with results of lower extremity dopplers on home and cell #'s.

## 2013-04-13 ENCOUNTER — Other Ambulatory Visit: Payer: Self-pay

## 2013-08-04 ENCOUNTER — Telehealth: Payer: Self-pay | Admitting: Cardiovascular Disease

## 2013-08-04 MED ORDER — PRAVASTATIN SODIUM 40 MG PO TABS: 40.0000 mg | ORAL_TABLET | Freq: Every day | ORAL | Status: DC

## 2013-08-04 NOTE — Telephone Encounter (Signed)
Rx was sent to pharmacy electronically. Patient states he needs appmt.. Recall date for 07/30/13.. Will notify appropriate scheduler

## 2013-08-04 NOTE — Telephone Encounter (Signed)
Per answering service-please call concerning his refills.

## 2013-08-07 ENCOUNTER — Telehealth: Payer: Self-pay | Admitting: Cardiovascular Disease

## 2013-08-07 NOTE — Telephone Encounter (Signed)
Please call-trying to see if you have called his medicine in. Pt could nott remember the name of the medicine.

## 2013-08-07 NOTE — Telephone Encounter (Signed)
Returned call and pt verified x 2.  Pt informed message received.  Pt stated he called last week when the office was closed and he received a call back on Thursday that Rx was sent.  Stated he hasn't heard from the pharmacy that it is ready.  RN asked pt if he contacted his pharmacy.  Pt denied.  Pt informed refill for pravastatin was sent on Friday and advised he contact his pharmacy.  Pt verbalized understanding and agreed w/ plan.

## 2013-08-25 ENCOUNTER — Ambulatory Visit (INDEPENDENT_AMBULATORY_CARE_PROVIDER_SITE_OTHER): Payer: BC Managed Care – PPO | Admitting: Cardiovascular Disease

## 2013-08-25 ENCOUNTER — Encounter: Payer: Self-pay | Admitting: Cardiovascular Disease

## 2013-08-25 VITALS — BP 142/80 | HR 97 | Resp 16 | Ht 72.0 in | Wt 225.1 lb

## 2013-08-25 DIAGNOSIS — I739 Peripheral vascular disease, unspecified: Secondary | ICD-10-CM

## 2013-08-25 DIAGNOSIS — Z79899 Other long term (current) drug therapy: Secondary | ICD-10-CM

## 2013-08-25 DIAGNOSIS — E119 Type 2 diabetes mellitus without complications: Secondary | ICD-10-CM

## 2013-08-25 DIAGNOSIS — E785 Hyperlipidemia, unspecified: Secondary | ICD-10-CM

## 2013-08-25 DIAGNOSIS — I2510 Atherosclerotic heart disease of native coronary artery without angina pectoris: Secondary | ICD-10-CM

## 2013-08-25 NOTE — Patient Instructions (Signed)
Please have fasting blood work at your earliest convenience.  We will contact you with the results.   Your physician wants you to follow-up in: 1 year. You will receive a reminder letter in the mail two months in advance. If you don't receive a letter, please call our office to schedule the follow-up appointment.

## 2013-08-28 ENCOUNTER — Encounter: Payer: Self-pay | Admitting: Cardiovascular Disease

## 2013-08-28 LAB — COMPREHENSIVE METABOLIC PANEL
ALBUMIN: 4.1 g/dL (ref 3.5–5.2)
ALK PHOS: 51 U/L (ref 39–117)
ALT: 19 U/L (ref 0–53)
AST: 16 U/L (ref 0–37)
BUN: 22 mg/dL (ref 6–23)
CO2: 22 mEq/L (ref 19–32)
Calcium: 9.2 mg/dL (ref 8.4–10.5)
Chloride: 104 mEq/L (ref 96–112)
Creat: 0.98 mg/dL (ref 0.50–1.35)
Glucose, Bld: 136 mg/dL — ABNORMAL HIGH (ref 70–99)
POTASSIUM: 4.4 meq/L (ref 3.5–5.3)
SODIUM: 140 meq/L (ref 135–145)
TOTAL PROTEIN: 6.6 g/dL (ref 6.0–8.3)
Total Bilirubin: 0.5 mg/dL (ref 0.2–1.2)

## 2013-08-28 LAB — LIPID PANEL
Cholesterol: 150 mg/dL (ref 0–200)
HDL: 30 mg/dL — AB (ref >39)
LDL CALC: 81 mg/dL (ref 0–99)
Total CHOL/HDL Ratio: 5 Ratio
Triglycerides: 193 mg/dL — ABNORMAL HIGH (ref <150)
VLDL: 39 mg/dL (ref 0–40)

## 2013-08-28 LAB — HEMOGLOBIN A1C
HEMOGLOBIN A1C: 6.4 % — AB (ref <5.7)
Mean Plasma Glucose: 137 mg/dL — ABNORMAL HIGH (ref <117)

## 2013-08-28 NOTE — Progress Notes (Signed)
Patient ID: Jonathan Shepard, male   DOB: 04/04/1951, 63 y.o.   MRN: [id number]      Reason for office visit CAD   Jonathan Shepard returns in f/u for CAD and risk factor management. He is asymptomatic. He had early onset CAD, but has not required revascularization since 2008. Low risk nuclear study and normal echo in 2011. He has DM,HTN and hyperlipidemia and struggles to lose weight. He has actually gained back 4 lb since last visit.  Initial presentation in 2002 - mid right coronary Hepacoat 3.0x13 stent. In May 2008 had restenosis in the right coronary artery stent and "sandwich stenting" - Liberte 3.75 stent. In December 2008 had cutting balloon angioplasty for in stent restenosis. In the same setting received a drug-eluting 3 x 15 mm Promus stent in the proximal LAD artery for a de novo lesion.   No Known Allergies  Current Outpatient Prescriptions  Medication Sig Dispense Refill  . aspirin EC 81 MG tablet Take 81 mg by mouth daily.      . Choline Fenofibrate 45 MG capsule Take 1 capsule (45 mg total) by mouth daily.  90 capsule  3  . clopidogrel (PLAVIX) 75 MG tablet Take 75 mg by mouth daily.      . diclofenac (VOLTAREN) 75 MG EC tablet Take 75 mg by mouth 2 (two) times daily.      Marland Kitchen glimepiride (AMARYL) 4 MG tablet Take 4 mg by mouth daily.      Marland Kitchen HYDROCODONE BITARTRATE ER PO Take by mouth as needed. twice a day      . metFORMIN (GLUCOPHAGE) 1000 MG tablet Take 1,000 mg by mouth 2 (two) times daily with a meal. Takes two tablets.      . metoprolol succinate (TOPROL-XL) 50 MG 24 hr tablet Take 1 tablet (50 mg total) by mouth daily. Take 1/2  with or immediately following a meal.  90 tablet  3  . Multiple Vitamin (MULTIVITAMIN) tablet Take 1 tablet by mouth daily.      . nitroGLYCERIN (NITROSTAT) 0.4 MG SL tablet Place 0.4 mg under the tongue every 5 (five) minutes as needed for chest pain.      . Omega-3 Fatty Acids (FISH OIL) 1200 MG CAPS Take 1 capsule by mouth 2 (two) times daily.        . pravastatin (PRAVACHOL) 40 MG tablet Take 1 tablet (40 mg total) by mouth daily.  90 tablet  1  . ramipril (ALTACE) 2.5 MG capsule Take 1 capsule (2.5 mg total) by mouth daily.  90 capsule  3   No current facility-administered medications for this visit.    Past Medical History  Diagnosis Date  . CAD (coronary artery disease)   . Obesity   . Hypertension   . Diabetes   . Hyperlipidemia     No past surgical history on file.  Family History  Problem Relation Age of Onset  . Coronary artery disease Father 70    CABG  . Coronary artery disease Brother 81    CABG    History   Social History  . Marital Status: Married    Spouse Name: N/A    Number of Children: N/A  . Years of Education: N/A   Occupational History  . Not on file.   Social History Main Topics  . Smoking status: Never Smoker   . Smokeless tobacco: Not on file  . Alcohol Use: Not on file  . Drug Use: Not on file  . Sexual Activity:  Not on file   Other Topics Concern  . Not on file   Social History Narrative   Has 3 children   Has 2 grandchildren    Review of systems: Has right sided posterior neck discomfort. Has frequent dizziness (orthostatic). Erectile dysfunction did not improve with PDE inhibitors The patient specifically denies any chest pain at rest or with exertion, dyspnea at rest or with exertion, orthopnea, paroxysmal nocturnal dyspnea, syncope, palpitations, focal neurological deficits, intermittent claudication, lower extremity edema, unexplained weight gain, cough, hemoptysis or wheezing.  The patient also denies abdominal pain, nausea, vomiting, dysphagia, diarrhea, constipation, polyuria, polydipsia, dysuria, hematuria, frequency, urgency, abnormal bleeding or bruising, fever, chills, unexpected weight changes, mood swings, change in skin or hair texture, change in voice quality, auditory or visual problems, allergic reactions or rashes.   PHYSICAL EXAM BP 142/80  Pulse 97  Resp  16  Ht 6' (1.829 m)  Wt 102.105 kg (225 lb 1.6 oz)  BMI 30.52 kg/m2  General: Alert, oriented x3, no distress Head: no evidence of trauma, PERRL, EOMI, no exophtalmos or lid lag, no myxedema, no xanthelasma; normal ears, nose and oropharynx Neck: normal jugular venous pulsations and no hepatojugular reflux; brisk carotid pulses without delay and no carotid bruits Chest: clear to auscultation, no signs of consolidation by percussion or palpation, normal fremitus, symmetrical and full respiratory excursions Cardiovascular: normal position and quality of the apical impulse, regular rhythm, normal first and second heart sounds, no murmurs, rubs or gallops Abdomen: no tenderness or distention, no masses by palpation, no abnormal pulsatility or arterial bruits, normal bowel sounds, no hepatosplenomegaly Extremities: no clubbing, cyanosis or edema; 2+ radial, ulnar and brachial pulses bilaterally; 2+ right femoral, posterior tibial and dorsalis pedis pulses; 2+ left femoral, posterior tibial and dorsalis pedis pulses; no subclavian or femoral bruits Neurological: grossly nonfocal   EKG: NSR  Lipid Panel  TC 162, TG 291, HDL 37, LDL58  BMET    Component Value Date/Time   NA 140 05/14/2007 0505   K 4.2 05/14/2007 0505   CL 104 05/14/2007 0505   CO2 26 05/14/2007 0505   GLUCOSE 108* 05/14/2007 0505   BUN 11 05/14/2007 0505   CREATININE 0.92 05/14/2007 0505   CALCIUM 9.2 05/14/2007 0505   GFRNONAA >60 05/14/2007 0505   GFRAA  Value: >60        The eGFR has been calculated using the MDRD equation. This calculation has not been validated in all clinical 05/14/2007 0505     ASSESSMENT AND PLAN CAD (coronary artery disease) Asymptomatic. Last functional study 2011. No angina with activity. Focus on risk factor management.  Dyslipidemia Repeat lipids after adding fibrate. Niacin did not help lipid profile.  PAD (peripheral artery disease) Lower extremity Duplex study shows occluded right dorsalis  pedis, but no stenoses in the suprapopliteal area and normal ABI bilaterally. No evidence of AAA or stenosis of the distal aorta.   Patient Instructions  Please have fasting blood work at your earliest convenience.  We will contact you with the results.   Your physician wants you to follow-up in: 1 year. You will receive a reminder letter in the mail two months in advance. If you don't receive a letter, please call our office to schedule the follow-up appointment.     Orders Placed This Encounter  Procedures  . Comprehensive metabolic panel  . Lipid panel  . HgB A1c  . EKG 12-Lead   Meds ordered this encounter  Medications  . glimepiride (AMARYL) 4 MG  tablet    Sig: Take 4 mg by mouth daily.    Holli Humbles, MD, [place] [phone] office [phone] pager

## 2013-08-28 NOTE — Assessment & Plan Note (Addendum)
Lower extremity Duplex study shows occluded right dorsalis pedis, but no stenoses in the suprapopliteal area and normal ABI bilaterally. No evidence of AAA or stenosis of the distal aorta.

## 2013-08-28 NOTE — Assessment & Plan Note (Signed)
Asymptomatic. Last functional study 2011. No angina with activity. Focus on risk factor management.

## 2013-08-28 NOTE — Assessment & Plan Note (Signed)
Repeat lipids after adding fibrate. Niacin did not help lipid profile.

## 2013-11-30 ENCOUNTER — Other Ambulatory Visit: Payer: Self-pay | Admitting: Cardiovascular Disease

## 2013-12-01 NOTE — Telephone Encounter (Signed)
Rx refill sent to patient pharmacy   

## 2014-01-03 ENCOUNTER — Other Ambulatory Visit: Payer: Self-pay | Admitting: Cardiovascular Disease

## 2014-01-04 NOTE — Telephone Encounter (Signed)
Rx was sent to pharmacy electronically. 

## 2014-02-04 ENCOUNTER — Other Ambulatory Visit: Payer: Self-pay | Admitting: Cardiovascular Disease

## 2014-02-04 ENCOUNTER — Other Ambulatory Visit: Payer: Self-pay | Admitting: Cardiology

## 2014-02-05 NOTE — Telephone Encounter (Signed)
Rx was sent to pharmacy electronically. 

## 2014-03-11 ENCOUNTER — Other Ambulatory Visit: Payer: Self-pay | Admitting: Cardiovascular Disease

## 2014-09-10 ENCOUNTER — Telehealth: Payer: Self-pay | Admitting: Cardiovascular Disease

## 2014-09-10 MED ORDER — CLOPIDOGREL BISULFATE 75 MG PO TABS: 75.0000 mg | ORAL_TABLET | Freq: Every day | ORAL | Status: DC

## 2014-09-10 NOTE — Telephone Encounter (Signed)
Refill submitted to patient's preferred pharmacy. Informed patient. Pt voiced understanding, no other stated concerns at this time.  

## 2014-09-10 NOTE — Telephone Encounter (Signed)
Rx refilled.

## 2014-09-10 NOTE — Telephone Encounter (Signed)
°  1. Which medications need to be refilled? Generic Plavix-please call today  2. Which pharmacy is medication to be sent to [facility]  3. Do they need a 30 day or 90 day supply? 90 and refills  4. Would they like a call back once the medication has been sent to the pharmacy? no

## 2014-10-15 ENCOUNTER — Telehealth: Payer: Self-pay | Admitting: Cardiovascular Disease

## 2014-10-15 MED ORDER — RAMIPRIL 2.5 MG PO CAPS: 2.5000 mg | ORAL_CAPSULE | Freq: Every day | ORAL | Status: DC

## 2014-10-15 NOTE — Telephone Encounter (Signed)
Rx refill sent to patient pharmacy  Spoke with patient and made OV for overdue 1 year

## 2014-10-15 NOTE — Telephone Encounter (Signed)
°  1. Which medications need to be refilled? Ramipril  2. Which pharmacy is medication to be sent to?[facility] [place] 3. Do they need a 30 day or 90 day supply? 90 and refills  4. Would they like a call back once the medication has been sent to the pharmacy? no

## 2014-10-28 ENCOUNTER — Telehealth: Payer: Self-pay | Admitting: Physician Assistant

## 2014-10-28 NOTE — Telephone Encounter (Signed)
Patient called Sunday answering service. C/o his BP running low. Yesterday c/o congestion, headache but today felt pretty decent this AM. While at a cafe this morning he had blurry vision and sweating. Felt like his BP was low. Did not check it right then. No syncope or palpitations. Got home an hour later, checked BP and it was 80/50.  Repeated it a short while later with repeat BP 100/68. He feels fine now. Recheck 103/67. Heart not racing.  He was out of his rampiril for about a week. He got this filled yesterday - took a dose last night at 6pm and another this morning around 7am. Says he is no longer taking Imdur. No CP, SOB, fever.   As BP is coming up and he feels fine now, I asked him to continue to observe for symptoms. If they recur he is to proceed to ER. If BP remains stable, he should call the office tomorrow morning with an update of his BP before he takes his medications so we can determine what adjustments need to be made. Suspect ACEI may need to be d/c'd altogether. He has no further antihypertensives due tonight. He verbalized understanding and gratitude.  Dayna Dunn PA-C

## 2014-10-29 ENCOUNTER — Telehealth: Payer: Self-pay | Admitting: Cardiovascular Disease

## 2014-10-29 NOTE — Telephone Encounter (Signed)
Stop Altace please.

## 2014-10-29 NOTE — Telephone Encounter (Signed)
Patient notified per Dr. Loletha Grayer stop the altace.   Continue checking BP's and keep a log.  Instructed to contact the office if BP goes above the normal range.  Patient voiced understanding.

## 2014-10-29 NOTE — Telephone Encounter (Signed)
Pt was off his altace 2.5mg  daily for about 1 week.  He picked up script on Friday, took dose that evening. Took dose again Saturday evening. Sunday morning he took the dose to resume it for his usual AM administration time. Soon after, he got very dizzy and lightheaded. Took BP, it was 80/60  Reports that his BP stayed low all-day Sunday.  He reports feeling much better today, has not taken ramipril. BP was 101/79  Advised to hold ramipril for now, take other meds as prescribed, recheck BP twice a day, call in a few days with readings, can defer to physician on whether to resume med at that time.  Will route to [name] for additional considerations.

## 2014-10-29 NOTE — Telephone Encounter (Signed)
Pt had episode this weekend,his blood pressure dropped to 80/60. Today it is 101/79.

## 2014-11-02 ENCOUNTER — Encounter: Payer: Self-pay | Admitting: Cardiovascular Disease

## 2014-11-02 ENCOUNTER — Telehealth: Payer: Self-pay | Admitting: Cardiovascular Disease

## 2014-11-02 NOTE — Telephone Encounter (Signed)
Returned call to patient. He had sent message via MyChart with BP readings after stopped ramipril due to a reaction in which his BP dropped. He reports he had taken this medication around 6pm one evening and then 6am the next morning and his BP bottomed out. He also reports he is only taking metoprolol succinate 12.5mg  daily - his Rx should be 25mg  daily. He sent BP and HR readings via MyChart and called in because he had not gotten a response, explained [name] is out of the office.   Advised patient to stay off ramipril as previously directed and to take metoprolol 25mg  as prescribed and monitor BP and HR for another 7-10 days.   Patient has follow up with [name] in June

## 2014-11-02 NOTE — Telephone Encounter (Signed)
Please call,pt said he had a reaction to his Ramipril.

## 2014-11-03 ENCOUNTER — Other Ambulatory Visit: Payer: Self-pay | Admitting: Cardiovascular Disease

## 2014-11-06 NOTE — Telephone Encounter (Signed)
Rx(s) sent to pharmacy electronically.  

## 2014-11-16 ENCOUNTER — Other Ambulatory Visit: Payer: Self-pay | Admitting: Cardiovascular Disease

## 2014-11-16 NOTE — Telephone Encounter (Signed)
Rx(s) sent to pharmacy electronically. OV 11/26/14 with Dr Sallyanne Kuster

## 2014-11-26 ENCOUNTER — Ambulatory Visit: Payer: Self-pay | Admitting: Cardiovascular Disease

## 2014-11-27 ENCOUNTER — Telehealth: Payer: Self-pay | Admitting: Cardiovascular Disease

## 2014-11-27 NOTE — Telephone Encounter (Signed)
Rodena Piety from Dr. Gavin Potters office (dental office ) is calling to see if [name] needs to take any pre-meds before dental work is done. Patient is in the office now , Please call   Thanks

## 2014-11-27 NOTE — Telephone Encounter (Signed)
Per triage protocol patient does not need pre-meds prior to dental work. Communicated this with Rodena Piety.

## 2014-11-29 ENCOUNTER — Ambulatory Visit (INDEPENDENT_AMBULATORY_CARE_PROVIDER_SITE_OTHER): Payer: Medicare HMO | Admitting: Cardiovascular Disease

## 2014-11-29 ENCOUNTER — Encounter: Payer: Self-pay | Admitting: *Deleted

## 2014-11-29 ENCOUNTER — Telehealth: Payer: Self-pay | Admitting: *Deleted

## 2014-11-29 ENCOUNTER — Encounter: Payer: Self-pay | Admitting: Cardiovascular Disease

## 2014-11-29 ENCOUNTER — Telehealth: Payer: Self-pay | Admitting: Cardiovascular Disease

## 2014-11-29 VITALS — BP 138/74 | HR 99 | Ht 72.0 in | Wt 220.2 lb

## 2014-11-29 DIAGNOSIS — I739 Peripheral vascular disease, unspecified: Secondary | ICD-10-CM | POA: Diagnosis not present

## 2014-11-29 DIAGNOSIS — E1151 Type 2 diabetes mellitus with diabetic peripheral angiopathy without gangrene: Secondary | ICD-10-CM

## 2014-11-29 DIAGNOSIS — E785 Hyperlipidemia, unspecified: Secondary | ICD-10-CM

## 2014-11-29 DIAGNOSIS — Z9889 Other specified postprocedural states: Secondary | ICD-10-CM

## 2014-11-29 DIAGNOSIS — E782 Mixed hyperlipidemia: Secondary | ICD-10-CM

## 2014-11-29 DIAGNOSIS — I25118 Atherosclerotic heart disease of native coronary artery with other forms of angina pectoris: Secondary | ICD-10-CM | POA: Diagnosis not present

## 2014-11-29 DIAGNOSIS — Z79899 Other long term (current) drug therapy: Secondary | ICD-10-CM

## 2014-11-29 DIAGNOSIS — Z95820 Peripheral vascular angioplasty status with implants and grafts: Secondary | ICD-10-CM

## 2014-11-29 MED ORDER — PRAVASTATIN SODIUM 80 MG PO TABS: 80.0000 mg | ORAL_TABLET | Freq: Every day | ORAL | Status: DC

## 2014-11-29 MED ORDER — PRAVASTATIN SODIUM 40 MG PO TABS: 40.0000 mg | ORAL_TABLET | Freq: Every day | ORAL | Status: DC

## 2014-11-29 MED ORDER — RAMIPRIL 2.5 MG PO CAPS: 2.5000 mg | ORAL_CAPSULE | Freq: Every day | ORAL | Status: DC

## 2014-11-29 MED ORDER — METOPROLOL SUCCINATE ER 50 MG PO TB24: 50.0000 mg | ORAL_TABLET | Freq: Every day | ORAL | Status: DC

## 2014-11-29 MED ORDER — FENOFIBRIC ACID 45 MG PO CPDR: 1.0000 | DELAYED_RELEASE_CAPSULE | Freq: Every day | ORAL | Status: DC

## 2014-11-29 NOTE — Patient Instructions (Signed)
Medication Instructions:   INCREASE METOPROLOL TO 50MG  DAILY  Labwork:  NONE  Testing/Procedures:  NONE  Follow-Up:  ONE YEAR

## 2014-11-29 NOTE — Telephone Encounter (Signed)
PATIENT NOTIFIED TO INCREASE PRAVASTATIN TO 80MG  QHS AND CONTINUE FENOFIBRIC ACID.  HAVE LABS RECHECKED END OF September FASTING.  PATIENT VOICED UNDERSTANDING.

## 2014-11-29 NOTE — Progress Notes (Signed)
Patient ID: Jonathan Shepard, male   DOB: [birth year], 64 y.o.   MRN: [id number]      Cardiology Office Note   Date:  11/29/2014   ID:  Jonathan Shepard, DOB [birth year], MRN [id number]  PCP:  Leonides Grills, MD  Cardiologist:   Sanda Klein, MD   Chief Complaint  Patient presents with  . Annual Exam    throat discomfort,no swelling or pain.no concerns. pt wants to know if to continue with ramapril      History of Present Illness: Jonathan Shepard is a 64 y.o. male who presents for  Follow-up for coronary artery disease , peripheral artery disease, mixed hyperlipidemia in the setting of type 2 diabetes mellitus, essential hypertension.   Boden continues to describe exertional angina (for him this is throat discomfort),  Predictably associated with greater than usual physical activity. He never experiences the discomfort at rest. He denies exertional dyspnea, intermittent claudication , palpitations, dizziness or syncope. His blood pressure has been in the high normal range and his heart rate is generally fast in the 90s.   in July 2014 a nuclear stress test performed for similar complaints of exertional discomfort was normal.   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  Initial presentation in 2002 - mid right coronary Hepacoat 3.0x13 stent. In May 2008 had restenosis in the right coronary artery stent and "sandwich stenting" - Liberte 3.75 stent. In December 2008 had cutting balloon angioplasty for in stent restenosis. In the same setting received a drug-eluting 3 x 15 mm Promus stent in the proximal LAD artery for a de novo lesion.  Past Medical History  Diagnosis Date  . CAD (coronary artery disease)   . Obesity   . Hypertension   . Diabetes   . Hyperlipidemia     No past surgical history on file.   Current Outpatient  Prescriptions  Medication Sig Dispense Refill  . aspirin EC 81 MG tablet Take 81 mg by mouth daily.    . Choline Fenofibrate (FENOFIBRIC ACID) 45 MG CPDR take 1 capsule by mouth once daily 90 capsule 0  . clopidogrel (PLAVIX) 75 MG tablet Take 1 tablet (75 mg total) by mouth daily. 90 tablet 3  . glimepiride (AMARYL) 4 MG tablet Take 4 mg by mouth daily.    Marland Kitchen HYDROCODONE BITARTRATE ER PO Take by mouth as needed. twice a day    . metFORMIN (GLUCOPHAGE) 1000 MG tablet Take 1,000 mg by mouth 2 (two) times daily with a meal. Takes two tablets.    . metoprolol succinate (TOPROL-XL) 25 MG 24 hr tablet Take 1 tablet (25 mg total) by mouth daily. 90 tablet 1  . Multiple Vitamin (MULTIVITAMIN) tablet Take 1 tablet by mouth daily.    . nitroGLYCERIN (NITROSTAT) 0.4 MG SL tablet Place 0.4 mg under the tongue every 5 (five) minutes as needed for chest pain.    . Omega-3 Fatty Acids (FISH OIL) 1200 MG CAPS Take 1 capsule by mouth 2 (two) times daily.    . pravastatin (PRAVACHOL) 40 MG tablet take 1 tablet by mouth once daily 90 tablet 0  . ramipril (ALTACE) 2.5 MG capsule Take 1 capsule (2.5 mg total) by mouth daily. 90 capsule 2  . HYDROcodone-acetaminophen (NORCO) 7.5-325 MG per tablet Take 1 tablet by mouth every 6 (six) hours.  0   No current  facility-administered medications for this visit.    Allergies:   Review of patient's allergies indicates no known allergies.    Social History:  The patient  reports that he has never smoked. He does not have any smokeless tobacco history on file.   Family History:  The patient's family history includes Coronary artery disease (age of onset: 52) in his brother; Coronary artery disease (age of onset: 4) in his father.    ROS:  Please see the history of present illness.    Otherwise, review of systems positive for none.   All other systems are reviewed and negative.    PHYSICAL EXAM: VS:  BP 138/74 mmHg  Pulse 99  Ht 6' (1.829 m)  Wt 220 lb 3.2 oz  (99.882 kg)  BMI 29.86 kg/m2 , BMI Body mass index is 29.86 kg/(m^2).  General: Alert, oriented x3, no distress Head: no evidence of trauma, PERRL, EOMI, no exophtalmos or lid lag, no myxedema, no xanthelasma; normal ears, nose and oropharynx Neck: normal jugular venous pulsations and no hepatojugular reflux; brisk carotid pulses without delay and no carotid bruits Chest: clear to auscultation, no signs of consolidation by percussion or palpation, normal fremitus, symmetrical and full respiratory excursions Cardiovascular: normal position and quality of the apical impulse, regular rhythm, normal first and second heart sounds, no murmurs, rubs or gallops Abdomen: no tenderness or distention, no masses by palpation, no abnormal pulsatility or arterial bruits, normal bowel sounds, no hepatosplenomegaly Extremities: no clubbing, cyanosis or edema; 2+ radial, ulnar and brachial pulses bilaterally; 2+ right femoral, posterior tibial and dorsalis pedis pulses; 2+ left femoral, posterior tibial and dorsalis pedis pulses; no subclavian or femoral bruits Neurological: grossly nonfocal Psych: euthymic mood, full affect   EKG:  EKG is ordered today. The ekg ordered today demonstrates  Relatively tall R WAVE in lead V1 and  Small Q waves in the inferior leads, probably indicative of an old inferoposterior infarction, there are no acute repolarization abnormalities.  Tracing is similar to 2014   Recent Labs: No results found for requested labs within last 365 days.    Lipid Panel    Component Value Date/Time   CHOL 150 08/28/2013 0709   TRIG 193* 08/28/2013 0709   HDL 30* 08/28/2013 0709   CHOLHDL 5.0 08/28/2013 0709   VLDL 39 08/28/2013 0709   LDLCALC 81 08/28/2013 0709      Wt Readings from Last 3 Encounters:  11/29/14 220 lb 3.2 oz (99.882 kg)  08/25/13 225 lb 1.6 oz (102.105 kg)  01/31/13 219 lb 6.4 oz (99.519 kg)     ASSESSMENT AND PLAN:  1. CAD with exertional angina pectoris -  Not  withstanding the normal nuclear stress test , Elier a seems to be describing typical exertional angina pectoris, with symptoms very similar otherwise he had at presentation. However the complaints are relatively mild, CCS class II. The normal nuclear perfusion is reassuring, suggesting that he has at most single-vessel disease, probably in a relatively small territory.   Described options of enhanced medical therapy versus coronary angiography with attempt at revascularization. We decided for the time being to increase his beta blocker to 50 mg once daily.   2.  Essential hypertension - We'll also continue ramipril. Blood pressure control is fair and will improve with increased beta blocker dose.   3.  Overweight - Congratulated him on losing some weight , but insisted that he continue his efforts since he is still close to obese range. Diabetes control is fair.  Most recent hemoglobin A1c 6.4%.   4.  Mixed hyperlipidemia - He has had a recent lipid profile and will retrieve results results from his primary care provider. Recent trial suggest that the combination of statin and fenofibrate is probably not beneficial. Depending on the lipid profile report, may decide to discontinue the fenofibric acid.   Current medicines are reviewed at length with the patient today.  The patient does not have concerns regarding medicines.  The following changes have been made:   Increase metoprolol succinate to 50 mg daily  Labs/ tests ordered today include:  No orders of the defined types were placed in this encounter.   Patient Instructions  Medication Instructions:   INCREASE METOPROLOL TO 50MG  DAILY  Labwork:  NONE  Testing/Procedures:  NONE  Follow-Up:  ONE YEAR         Signed, Sanda Klein, MD  11/29/2014 9:22 AM    Sanda Klein, MD, [facility] [phone] office [phone] pager

## 2014-11-29 NOTE — Telephone Encounter (Signed)
Pt wants you to know he is taking Metoprolol 12.5 mg instead of 25 mg.

## 2014-11-29 NOTE — Addendum Note (Signed)
Addended by: Janett Labella A on: 11/29/2014 10:18 AM   Modules accepted: Orders

## 2014-11-30 ENCOUNTER — Encounter: Payer: Self-pay | Admitting: Cardiovascular Disease

## 2014-11-30 ENCOUNTER — Other Ambulatory Visit: Payer: Self-pay

## 2014-11-30 MED ORDER — CLOPIDOGREL BISULFATE 75 MG PO TABS: 75.0000 mg | ORAL_TABLET | Freq: Every day | ORAL | Status: DC

## 2014-11-30 NOTE — Telephone Encounter (Signed)
Rx(s) sent to pharmacy electronically.  

## 2014-12-03 MED ORDER — METOPROLOL SUCCINATE ER 25 MG PO TB24: 25.0000 mg | ORAL_TABLET | Freq: Every day | ORAL | Status: DC

## 2014-12-03 NOTE — Telephone Encounter (Signed)
Understood 

## 2014-12-03 NOTE — Telephone Encounter (Signed)
Patient states he has been taking 12.5mg  of metoprolol daily for about 2 years. At last visit his metoprolol with doubled so he increased it to 25mg  daily. States he is having no problems.   Med list corrected and I will send this info to [name].

## 2014-12-04 ENCOUNTER — Other Ambulatory Visit: Payer: Self-pay | Admitting: *Deleted

## 2014-12-06 ENCOUNTER — Other Ambulatory Visit: Payer: Self-pay | Admitting: *Deleted

## 2014-12-06 MED ORDER — METOPROLOL SUCCINATE ER 25 MG PO TB24: 50.0000 mg | ORAL_TABLET | Freq: Every day | ORAL | Status: DC

## 2014-12-11 ENCOUNTER — Telehealth: Payer: Self-pay | Admitting: Cardiovascular Disease

## 2014-12-11 NOTE — Telephone Encounter (Signed)
Called [employer] to confirm Metoprolol dose which is 50mg  daily.  Tablet will be changed to the 50mg  tablet.

## 2014-12-11 NOTE — Telephone Encounter (Signed)
Colletta [place] called in wanting to clarify a prescription for the pt. She says that there are 2 different strengths on file for Metoprolol. She would like to know which strength is the correct one. Please call  Thanks

## 2014-12-12 NOTE — Telephone Encounter (Signed)
Can this encounter be closed?

## 2015-01-22 ENCOUNTER — Other Ambulatory Visit: Payer: Self-pay | Admitting: *Deleted

## 2015-01-22 MED ORDER — METOPROLOL SUCCINATE ER 25 MG PO TB24: 50.0000 mg | ORAL_TABLET | Freq: Every day | ORAL | Status: DC

## 2015-02-07 ENCOUNTER — Ambulatory Visit: Payer: Self-pay | Admitting: Cardiovascular Disease

## 2015-03-01 ENCOUNTER — Encounter: Payer: Self-pay | Admitting: Cardiovascular Disease

## 2015-03-05 ENCOUNTER — Other Ambulatory Visit: Payer: Self-pay | Admitting: *Deleted

## 2015-03-05 DIAGNOSIS — E785 Hyperlipidemia, unspecified: Secondary | ICD-10-CM

## 2015-03-05 DIAGNOSIS — Z79899 Other long term (current) drug therapy: Secondary | ICD-10-CM

## 2015-03-13 ENCOUNTER — Telehealth: Payer: Self-pay | Admitting: Cardiovascular Disease

## 2015-03-13 NOTE — Telephone Encounter (Signed)
Left message for pt, his lab orders are already in the system. If he is coming here for lab work he does not need any paperwork. He will call back if that is not the case.

## 2015-03-13 NOTE — Telephone Encounter (Signed)
Pt needs an order for lab work please.

## 2015-03-15 LAB — LIPID PANEL
CHOL/HDL RATIO: 5.6 ratio — AB (ref <=5.0)
Cholesterol: 158 mg/dL (ref 125–200)
HDL: 28 mg/dL — AB (ref >=40)
LDL CALC: 74 mg/dL (ref <130)
TRIGLYCERIDES: 281 mg/dL — AB (ref <150)
VLDL: 56 mg/dL — AB (ref <30)

## 2015-03-15 LAB — COMPREHENSIVE METABOLIC PANEL
ALBUMIN: 4.3 g/dL (ref 3.6–5.1)
ALT: 36 U/L (ref 9–46)
AST: 26 U/L (ref 10–35)
Alkaline Phosphatase: 62 U/L (ref 40–115)
BILIRUBIN TOTAL: 0.5 mg/dL (ref 0.2–1.2)
BUN: 20 mg/dL (ref 7–25)
CALCIUM: 9.8 mg/dL (ref 8.6–10.3)
CHLORIDE: 105 mmol/L (ref 98–110)
CO2: 25 mmol/L (ref 20–31)
Creat: 0.9 mg/dL (ref 0.70–1.25)
GLUCOSE: 167 mg/dL — AB (ref 65–99)
Potassium: 4.6 mmol/L (ref 3.5–5.3)
Sodium: 138 mmol/L (ref 135–146)
Total Protein: 6.7 g/dL (ref 6.1–8.1)

## 2015-09-10 DIAGNOSIS — M9902 Segmental and somatic dysfunction of thoracic region: Secondary | ICD-10-CM | POA: Diagnosis not present

## 2015-09-10 DIAGNOSIS — M542 Cervicalgia: Secondary | ICD-10-CM | POA: Diagnosis not present

## 2015-09-10 DIAGNOSIS — M9901 Segmental and somatic dysfunction of cervical region: Secondary | ICD-10-CM | POA: Diagnosis not present

## 2015-09-10 DIAGNOSIS — M546 Pain in thoracic spine: Secondary | ICD-10-CM | POA: Diagnosis not present

## 2015-09-11 DIAGNOSIS — M546 Pain in thoracic spine: Secondary | ICD-10-CM | POA: Diagnosis not present

## 2015-09-11 DIAGNOSIS — M9902 Segmental and somatic dysfunction of thoracic region: Secondary | ICD-10-CM | POA: Diagnosis not present

## 2015-09-11 DIAGNOSIS — M9901 Segmental and somatic dysfunction of cervical region: Secondary | ICD-10-CM | POA: Diagnosis not present

## 2015-09-11 DIAGNOSIS — M542 Cervicalgia: Secondary | ICD-10-CM | POA: Diagnosis not present

## 2015-09-13 DIAGNOSIS — M9901 Segmental and somatic dysfunction of cervical region: Secondary | ICD-10-CM | POA: Diagnosis not present

## 2015-09-13 DIAGNOSIS — M546 Pain in thoracic spine: Secondary | ICD-10-CM | POA: Diagnosis not present

## 2015-09-13 DIAGNOSIS — M542 Cervicalgia: Secondary | ICD-10-CM | POA: Diagnosis not present

## 2015-09-13 DIAGNOSIS — M9902 Segmental and somatic dysfunction of thoracic region: Secondary | ICD-10-CM | POA: Diagnosis not present

## 2015-09-16 DIAGNOSIS — M546 Pain in thoracic spine: Secondary | ICD-10-CM | POA: Diagnosis not present

## 2015-09-16 DIAGNOSIS — M542 Cervicalgia: Secondary | ICD-10-CM | POA: Diagnosis not present

## 2015-09-16 DIAGNOSIS — M9901 Segmental and somatic dysfunction of cervical region: Secondary | ICD-10-CM | POA: Diagnosis not present

## 2015-09-16 DIAGNOSIS — M9902 Segmental and somatic dysfunction of thoracic region: Secondary | ICD-10-CM | POA: Diagnosis not present

## 2015-09-18 DIAGNOSIS — M9901 Segmental and somatic dysfunction of cervical region: Secondary | ICD-10-CM | POA: Diagnosis not present

## 2015-09-18 DIAGNOSIS — M546 Pain in thoracic spine: Secondary | ICD-10-CM | POA: Diagnosis not present

## 2015-09-18 DIAGNOSIS — M542 Cervicalgia: Secondary | ICD-10-CM | POA: Diagnosis not present

## 2015-09-18 DIAGNOSIS — M9902 Segmental and somatic dysfunction of thoracic region: Secondary | ICD-10-CM | POA: Diagnosis not present

## 2015-09-19 ENCOUNTER — Other Ambulatory Visit (HOSPITAL_COMMUNITY): Payer: Self-pay | Admitting: Registered Nurse

## 2015-09-19 ENCOUNTER — Ambulatory Visit (HOSPITAL_COMMUNITY)
Admission: RE | Admit: 2015-09-19 | Discharge: 2015-09-19 | Disposition: A | Discharge status: Home / Self Care | Payer: Medicare HMO | Source: Ambulatory Visit | Attending: Registered Nurse | Admitting: Registered Nurse

## 2015-09-19 DIAGNOSIS — Z1389 Encounter for screening for other disorder: Secondary | ICD-10-CM | POA: Diagnosis not present

## 2015-09-19 DIAGNOSIS — M4802 Spinal stenosis, cervical region: Secondary | ICD-10-CM | POA: Insufficient documentation

## 2015-09-19 DIAGNOSIS — E119 Type 2 diabetes mellitus without complications: Secondary | ICD-10-CM | POA: Diagnosis not present

## 2015-09-19 DIAGNOSIS — E669 Obesity, unspecified: Secondary | ICD-10-CM | POA: Diagnosis not present

## 2015-09-19 DIAGNOSIS — Z6832 Body mass index (BMI) 32.0-32.9, adult: Secondary | ICD-10-CM | POA: Diagnosis not present

## 2015-09-19 DIAGNOSIS — E782 Mixed hyperlipidemia: Secondary | ICD-10-CM | POA: Diagnosis not present

## 2015-09-19 DIAGNOSIS — M542 Cervicalgia: Secondary | ICD-10-CM | POA: Diagnosis not present

## 2015-09-19 DIAGNOSIS — M1990 Unspecified osteoarthritis, unspecified site: Secondary | ICD-10-CM | POA: Diagnosis not present

## 2015-09-19 DIAGNOSIS — Z79891 Long term (current) use of opiate analgesic: Secondary | ICD-10-CM | POA: Diagnosis not present

## 2015-09-19 DIAGNOSIS — K219 Gastro-esophageal reflux disease without esophagitis: Secondary | ICD-10-CM | POA: Diagnosis not present

## 2015-09-19 DIAGNOSIS — G894 Chronic pain syndrome: Secondary | ICD-10-CM | POA: Insufficient documentation

## 2015-10-12 ENCOUNTER — Other Ambulatory Visit: Payer: Self-pay | Admitting: Cardiovascular Disease

## 2015-10-14 NOTE — Telephone Encounter (Signed)
Rx request sent to pharmacy.  

## 2015-12-08 ENCOUNTER — Other Ambulatory Visit: Payer: Self-pay | Admitting: Cardiovascular Disease

## 2015-12-09 MED ORDER — CLOPIDOGREL BISULFATE 75 MG PO TABS: 75.0000 mg | ORAL_TABLET | Freq: Every day | ORAL | Status: DC

## 2015-12-16 ENCOUNTER — Encounter: Payer: Self-pay | Admitting: Cardiovascular Disease

## 2015-12-16 ENCOUNTER — Other Ambulatory Visit: Payer: Self-pay | Admitting: Cardiovascular Disease

## 2015-12-16 MED ORDER — RAMIPRIL 2.5 MG PO CAPS: 2.5000 mg | ORAL_CAPSULE | Freq: Every day | ORAL | Status: DC

## 2015-12-16 MED ORDER — PRAVASTATIN SODIUM 80 MG PO TABS: 80.0000 mg | ORAL_TABLET | Freq: Every day | ORAL | Status: DC

## 2015-12-16 MED ORDER — METOPROLOL SUCCINATE ER 25 MG PO TB24: 50.0000 mg | ORAL_TABLET | Freq: Every day | ORAL | Status: DC

## 2015-12-17 ENCOUNTER — Other Ambulatory Visit: Payer: Self-pay | Admitting: Cardiovascular Disease

## 2015-12-17 NOTE — Telephone Encounter (Signed)
°*  STAT* If patient is at the pharmacy, call can be transferred to refill team.   1. Which medications need to be refilled? (please list name of each medication and dose if known) Metoprolol-pt have not had any since [place] needs this medicine-pt is trying to get appt,says his blood pressure and pulse rate is high today  2. Which pharmacy/location (including street and city if local pharmacy) is medication to be sent to?[facility] in [place]  3. Do they need a 30 day or 90 day supply? [place]

## 2015-12-18 MED ORDER — METOPROLOL SUCCINATE ER 25 MG PO TB24: 50.0000 mg | ORAL_TABLET | Freq: Every day | ORAL | Status: DC

## 2015-12-18 NOTE — Telephone Encounter (Signed)
Medication refilled 12/18/15 Patient notified.

## 2015-12-19 DIAGNOSIS — M159 Polyosteoarthritis, unspecified: Secondary | ICD-10-CM | POA: Diagnosis not present

## 2015-12-19 DIAGNOSIS — Z6829 Body mass index (BMI) 29.0-29.9, adult: Secondary | ICD-10-CM | POA: Diagnosis not present

## 2015-12-19 DIAGNOSIS — K219 Gastro-esophageal reflux disease without esophagitis: Secondary | ICD-10-CM | POA: Diagnosis not present

## 2015-12-19 DIAGNOSIS — I2510 Atherosclerotic heart disease of native coronary artery without angina pectoris: Secondary | ICD-10-CM | POA: Diagnosis not present

## 2015-12-19 DIAGNOSIS — I10 Essential (primary) hypertension: Secondary | ICD-10-CM | POA: Diagnosis not present

## 2015-12-19 DIAGNOSIS — E785 Hyperlipidemia, unspecified: Secondary | ICD-10-CM | POA: Diagnosis not present

## 2015-12-30 DIAGNOSIS — E1129 Type 2 diabetes mellitus with other diabetic kidney complication: Secondary | ICD-10-CM | POA: Diagnosis not present

## 2015-12-30 DIAGNOSIS — Z6832 Body mass index (BMI) 32.0-32.9, adult: Secondary | ICD-10-CM | POA: Diagnosis not present

## 2015-12-30 DIAGNOSIS — Z0001 Encounter for general adult medical examination with abnormal findings: Secondary | ICD-10-CM | POA: Diagnosis not present

## 2015-12-30 DIAGNOSIS — Z1389 Encounter for screening for other disorder: Secondary | ICD-10-CM | POA: Diagnosis not present

## 2015-12-30 DIAGNOSIS — I209 Angina pectoris, unspecified: Secondary | ICD-10-CM | POA: Diagnosis not present

## 2015-12-30 DIAGNOSIS — I200 Unstable angina: Secondary | ICD-10-CM | POA: Diagnosis not present

## 2015-12-30 DIAGNOSIS — I2510 Atherosclerotic heart disease of native coronary artery without angina pectoris: Secondary | ICD-10-CM | POA: Diagnosis not present

## 2015-12-30 DIAGNOSIS — E6609 Other obesity due to excess calories: Secondary | ICD-10-CM | POA: Diagnosis not present

## 2015-12-31 DIAGNOSIS — Z1389 Encounter for screening for other disorder: Secondary | ICD-10-CM | POA: Diagnosis not present

## 2015-12-31 DIAGNOSIS — E6609 Other obesity due to excess calories: Secondary | ICD-10-CM | POA: Diagnosis not present

## 2015-12-31 DIAGNOSIS — N182 Chronic kidney disease, stage 2 (mild): Secondary | ICD-10-CM | POA: Diagnosis not present

## 2015-12-31 DIAGNOSIS — Z6832 Body mass index (BMI) 32.0-32.9, adult: Secondary | ICD-10-CM | POA: Diagnosis not present

## 2015-12-31 DIAGNOSIS — E782 Mixed hyperlipidemia: Secondary | ICD-10-CM | POA: Diagnosis not present

## 2015-12-31 DIAGNOSIS — Z0001 Encounter for general adult medical examination with abnormal findings: Secondary | ICD-10-CM | POA: Diagnosis not present

## 2016-01-03 ENCOUNTER — Telehealth: Payer: Self-pay | Admitting: Cardiovascular Disease

## 2016-01-03 NOTE — Telephone Encounter (Signed)
New message   Pt experiencing angina. Pt wants to have an earlier appt time. Please call.

## 2016-01-03 NOTE — Telephone Encounter (Signed)
Patient states he would like an earlier appointment as he is having angina. He has been experiencing this from March 2016. Angina occurs w/exertion and he gets short of breath. He feels like "someone is choking him" and this is similar to when he had 1st blockage. [place] the [place] and experienced these symptoms after 2 flights of stairs.   Patient scheduled for 01/07/16

## 2016-01-07 ENCOUNTER — Ambulatory Visit (INDEPENDENT_AMBULATORY_CARE_PROVIDER_SITE_OTHER): Payer: Commercial Managed Care - HMO | Admitting: Cardiovascular Disease

## 2016-01-07 ENCOUNTER — Encounter: Payer: Self-pay | Admitting: Cardiovascular Disease

## 2016-01-07 VITALS — BP 142/72 | HR 72 | Ht 72.0 in | Wt 222.0 lb

## 2016-01-07 DIAGNOSIS — I739 Peripheral vascular disease, unspecified: Secondary | ICD-10-CM

## 2016-01-07 DIAGNOSIS — E1151 Type 2 diabetes mellitus with diabetic peripheral angiopathy without gangrene: Secondary | ICD-10-CM | POA: Diagnosis not present

## 2016-01-07 DIAGNOSIS — E782 Mixed hyperlipidemia: Secondary | ICD-10-CM

## 2016-01-07 DIAGNOSIS — I25118 Atherosclerotic heart disease of native coronary artery with other forms of angina pectoris: Secondary | ICD-10-CM

## 2016-01-07 MED ORDER — NITROGLYCERIN 0.4 MG SL SUBL: 0.4000 mg | SUBLINGUAL_TABLET | SUBLINGUAL | 3 refills | Status: DC | PRN

## 2016-01-07 NOTE — Progress Notes (Signed)
Cardiology Office Note    Date:  01/07/2016   ID:  Jonathan Shepard, DOB May 17, 1951, MRN [id number]  PCP:  Leonides Grills, MD  Cardiologist:   Sanda Klein, MD   Chief Complaint  Patient presents with  . Follow-up    pt c/o chest pain and sob    History of Present Illness:  Jonathan Shepard is a 65 y.o. male with long-standing history of coronary artery disease who presents with complaints of stable angina pectoris. Recently this has worsened, but this seems to coincide with temporary interruption in his beta blocker. There was a mixup with his mail order pharmacy. He has noticed it during greater than usual physical activity such as climbing stairs faster pulling out the trash cans on his [address] foot driveway. His angina manifest as throat discomfort. It resolves promptly with rest. His electrocardiogram today is normal. Nitrates were recently prescribed by his primary care provider just last week.  His most recent hemoglobin A1c was slightly elevated at 7.1%, but he reports his lipid profile as being "okay". The labs are not available for review.  Initial presentation in 2002 - mid right coronary Hepacoat 3.0x13 stent. In May 2008 had restenosis in the right coronary artery stent and "sandwich stenting" - Liberte 3.75 stent. In December 2008 had cutting balloon angioplasty for in stent restenosis. In the same setting received a drug-eluting 3 x 15 mm Promus stent in the proximal LAD artery for a de novo lesion. in July 2014 a nuclear stress test performed for similar complaints of exertional discomfort was normal.  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   Past Medical History:  Diagnosis Date  . CAD (coronary artery disease)   . Diabetes ([place])   . Hyperlipidemia   . Hypertension   . Obesity     No past surgical history on file.  Current  Medications: Outpatient Medications Prior to Visit  Medication Sig Dispense Refill  . aspirin EC 81 MG tablet Take 81 mg by mouth daily.    . Choline Fenofibrate (FENOFIBRIC ACID) 45 MG CPDR Take 1 capsule by mouth daily. 90 capsule 3  . clopidogrel (PLAVIX) 75 MG tablet Take 1 tablet (75 mg total) by mouth daily. Needs appointment for refills. 90 tablet 0  . glimepiride (AMARYL) 4 MG tablet Take 4 mg by mouth daily.    Marland Kitchen HYDROCODONE BITARTRATE ER PO Take by mouth as needed. twice a day    . HYDROcodone-acetaminophen (NORCO) 7.5-325 MG per tablet Take 1 tablet by mouth every 6 (six) hours.  0  . metFORMIN (GLUCOPHAGE) 1000 MG tablet Take 1,000 mg by mouth 2 (two) times daily with a meal. Takes two tablets.    . metoprolol succinate (TOPROL-XL) 25 MG 24 hr tablet Take 2 tablets (50 mg total) by mouth daily. 180 tablet 0  . Multiple Vitamin (MULTIVITAMIN) tablet Take 1 tablet by mouth daily.    . Omega-3 Fatty Acids (FISH OIL) 1200 MG CAPS Take 1 capsule by mouth 3 (three) times daily.     . pravastatin (PRAVACHOL) 80 MG tablet Take 1 tablet (80 mg total) by mouth daily. 90 tablet 0  . ramipril (ALTACE) 2.5 MG capsule Take 1 capsule (2.5 mg total) by mouth daily. 90 capsule 0  . nitroGLYCERIN (NITROSTAT) 0.4 MG SL tablet Place 0.4 mg under the tongue every 5 (five) minutes as needed for chest  pain.     No facility-administered medications prior to visit.      Allergies:   Review of patient's allergies indicates no known allergies.   Social History   Social History  . Marital status: Married    Spouse name: N/A  . Number of children: N/A  . Years of education: N/A   Social History Main Topics  . Smoking status: Never Smoker  . Smokeless tobacco: None  . Alcohol use None  . Drug use: Unknown  . Sexual activity: Not Asked   Other Topics Concern  . None   Social History Narrative   Has 3 children   Has 2 grandchildren     Family History:  The patient's family history includes  Coronary artery disease (age of onset: 26) in his brother; Coronary artery disease (age of onset: 92) in his father.   ROS:   Please see the history of present illness.    ROS All other systems reviewed and are negative.   PHYSICAL EXAM:   VS:  BP (!) 142/72   Pulse 72   Ht 6' (1.829 m)   Wt 100.7 kg (222 lb)   BMI 30.11 kg/m    GEN: Well nourished, well developed, in no acute distress  HEENT: normal  Neck: no JVD, carotid bruits, or masses Cardiac: RRR; no murmurs, rubs, or gallops,no edema  Respiratory:  clear to auscultation bilaterally, normal work of breathing GI: soft, nontender, nondistended, + BS MS: no deformity or atrophy  Skin: warm and dry, no rash Neuro:  Alert and Oriented x 3, Strength and sensation are intact Psych: euthymic mood, full affect  Wt Readings from Last 3 Encounters:  01/07/16 100.7 kg (222 lb)  11/29/14 99.9 kg (220 lb 3.2 oz)  08/25/13 102.1 kg (225 lb 1.6 oz)      Studies/Labs Reviewed:   EKG:  EKG is ordered today.  The ekg ordered today demonstrates Normal sinus rhythm, normal tracing, QTC 409 ms  Recent Labs: 03/14/2015: ALT 36; BUN 20; Creat 0.90; Potassium 4.6; Sodium 138   Lipid Panel    Component Value Date/Time   CHOL 158 03/14/2015 0705   TRIG 281 (H) 03/14/2015 0705   HDL 28 (L) 03/14/2015 0705   CHOLHDL 5.6 (H) 03/14/2015 0705   VLDL 56 (H) 03/14/2015 0705   LDLCALC 74 03/14/2015 0705    ASSESSMENT:    1. Coronary artery disease involving native coronary artery of native heart with other form of angina pectoris ([place])   2. PAD (peripheral artery disease) ([place])   3. Type 2 diabetes mellitus with diabetic peripheral angiopathy without gangrene, without long-term current use of insulin ([place])   4. Mixed hyperlipidemia      PLAN:  In order of problems listed above:  1. CAD: He describes typical stable angina pectoris. The recent mild progression of symptoms may be related to temporary beta blocker interruption. We spent  quite a while discussing the difference between stable and unstable symptoms of coronary insufficiency. He should seek immediate attention if he develops accelerating pattern of angina (lower angina threshold, prolonged episodes, angina at rest, etc.). Continue with oral antianginals. If he requires further escalation in therapy would recommend proceeding directly to angiography rather than another stress test 2. PAD: Asymptomatic 3. DM: Borderline control, target A1c less than 7% 4. HLP: Mildly elevated triglycerides and low HDL should improve with better glycemic control. LDL and total cholesterol parameters at target.    Medication Adjustments/Labs and Tests Ordered: Current medicines are  reviewed at length with the patient today.  Concerns regarding medicines are outlined above.  Medication changes, Labs and Tests ordered today are listed in the Patient Instructions below. Patient Instructions  Dr Sallyanne Kuster recommends that you schedule a follow-up appointment in 3 months.  If you need a refill on your cardiac medications before your next appointment, please call your pharmacy.    Signed, Sanda Klein, MD  01/07/2016 2:15 PM    [address], [place], [place]  [address] Phone: [phone]; Fax: [phone]

## 2016-01-07 NOTE — Patient Instructions (Signed)
Dr Croitoru recommends that you schedule a follow-up appointment in 3 months.  If you need a refill on your cardiac medications before your next appointment, please call your pharmacy. 

## 2016-01-08 DIAGNOSIS — E1129 Type 2 diabetes mellitus with other diabetic kidney complication: Secondary | ICD-10-CM | POA: Diagnosis not present

## 2016-01-14 DIAGNOSIS — E119 Type 2 diabetes mellitus without complications: Secondary | ICD-10-CM | POA: Diagnosis not present

## 2016-01-30 ENCOUNTER — Other Ambulatory Visit: Payer: Self-pay | Admitting: Cardiovascular Disease

## 2016-02-03 ENCOUNTER — Ambulatory Visit: Payer: Medicare HMO | Admitting: Cardiovascular Disease

## 2016-04-09 ENCOUNTER — Encounter: Payer: Self-pay | Admitting: Cardiology

## 2016-04-09 ENCOUNTER — Ambulatory Visit (INDEPENDENT_AMBULATORY_CARE_PROVIDER_SITE_OTHER): Payer: Commercial Managed Care - HMO | Admitting: Cardiovascular Disease

## 2016-04-09 ENCOUNTER — Encounter: Payer: Self-pay | Admitting: Cardiovascular Disease

## 2016-04-09 VITALS — BP 131/73 | HR 81 | Ht 72.0 in | Wt 224.0 lb

## 2016-04-09 DIAGNOSIS — D689 Coagulation defect, unspecified: Secondary | ICD-10-CM

## 2016-04-09 DIAGNOSIS — I739 Peripheral vascular disease, unspecified: Secondary | ICD-10-CM

## 2016-04-09 DIAGNOSIS — I208 Other forms of angina pectoris: Secondary | ICD-10-CM

## 2016-04-09 DIAGNOSIS — E1151 Type 2 diabetes mellitus with diabetic peripheral angiopathy without gangrene: Secondary | ICD-10-CM

## 2016-04-09 DIAGNOSIS — R5383 Other fatigue: Secondary | ICD-10-CM

## 2016-04-09 DIAGNOSIS — Z01812 Encounter for preprocedural laboratory examination: Secondary | ICD-10-CM

## 2016-04-09 DIAGNOSIS — E782 Mixed hyperlipidemia: Secondary | ICD-10-CM

## 2016-04-09 DIAGNOSIS — I2089 Other forms of angina pectoris: Secondary | ICD-10-CM

## 2016-04-09 LAB — CBC
HCT: 42.7 % (ref 38.5–50.0)
Hemoglobin: 14 g/dL (ref 13.2–17.1)
MCH: 30.6 pg (ref 27.0–33.0)
MCHC: 32.8 g/dL (ref 32.0–36.0)
MCV: 93.4 fL (ref 80.0–100.0)
MPV: 11 fL (ref 7.5–12.5)
PLATELETS: 175 10*3/uL (ref 140–400)
RBC: 4.57 MIL/uL (ref 4.20–5.80)
RDW: 13.4 % (ref 11.0–15.0)
WBC: 7.2 10*3/uL (ref 3.8–10.8)

## 2016-04-09 LAB — APTT: APTT: 26 s (ref 22–34)

## 2016-04-09 LAB — TSH: TSH: 1.35 m[IU]/L (ref 0.40–4.50)

## 2016-04-09 LAB — PROTIME-INR
INR: 1
Prothrombin Time: 10.7 s (ref 9.0–11.5)

## 2016-04-09 NOTE — Patient Instructions (Signed)
Dr Sallyanne Kuster has requested that you have a left cardiac catheterization with Dr Peter Martinique on Tuesday, November 7th. Cardiac catheterization is used to diagnose and/or treat various heart conditions. Doctors may recommend this procedure for a number of different reasons. The most common reason is to evaluate chest pain. Chest pain can be a symptom of coronary artery disease (CAD), and cardiac catheterization can show whether plaque is narrowing or blocking your heart's arteries. This procedure is also used to evaluate the valves, as well as measure the blood flow and oxygen levels in different parts of your heart. For further information please visit [email or web address].   Following your catheterization, you will not be allowed to drive for 3 days.  No lifting, pushing, or pulling greater that 10 pounds is allowed for 1 week.  You will be required to have the following tests prior to the procedure:  1. Blood work - the blood work can be done no more than 7 days prior to the procedure. It can be done at any [facility] lab.  There is one downstairs on the first floor of this building and one in the [facility] building [phone]).   On the day of the procedure please HOLD the following medications:  >Metformin  >Ramipril  Continue taking Clopidogrel and Metoprolol.

## 2016-04-09 NOTE — Progress Notes (Signed)
Cardiology Office Note    Date:  04/09/2016   ID:  Jonathan Shepard, DOB 03-Nov-1950, MRN [id number]  PCP:  [name]., MD  Cardiologist:   Sanda Klein, MD   Chief Complaint  Patient presents with  . Follow-up    pt states, symptoms have not improved DOE,     History of Present Illness:  Jonathan Shepard is a 65 y.o. male with long-standing history of coronary artery disease who presents with complaints of stable angina pectoris. Recently this has worsened. Initially it seemed to to coincide with temporary interruption in his beta blocker. Even after restarting the beta blocker and taking long acting nitrates he still has CCS class II exertional dyspnea. It happens if he climbs a flight of stairs or walks to his mailbox, especially if he does that after a meal. It still resolves promptly with rest. He is having some headaches, that seem to worsen when he is having chest pain.  His most recent hemoglobin A1c was slightly elevated at 7.1%. His lipid profile from July is fair, shows LDL cholesterol 78 total cholesterol 165, but also triglycerides 244 and borderline HDL 38.  Initial presentation in 2002 - mid right coronary Hepacoat 3.0x13 stent.  In May 2008 had restenosis in the right coronary artery stent and "sandwich stenting" - Liberte 3.75 stent.  In December 2008 had cutting balloon angioplasty for in stent restenosis. In the same setting received a drug-eluting 3 x 15 mm Promus stent in the proximal LAD artery for a de novo lesion. July 2014 a nuclear stress test performed for similar complaints of exertional discomfort was normal.  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   Past Medical History:  Diagnosis Date  . CAD (coronary artery disease)   . Diabetes ([place])   . Hyperlipidemia   . Hypertension   . Obesity     No past surgical history on  file.  Current Medications: Outpatient Medications Prior to Visit  Medication Sig Dispense Refill  . aspirin EC 81 MG tablet Take 81 mg by mouth daily.    . Choline Fenofibrate (FENOFIBRIC ACID) 45 MG CPDR TAKE 1 CAPSULE ONE TIME DAILY 90 capsule 2  . clopidogrel (PLAVIX) 75 MG tablet TAKE 1 TABLET DAILY (NEEDS APPOINTMENT FOR REFILLS.) 90 tablet 2  . diclofenac (VOLTAREN) 75 MG EC tablet Take 1 tablet by mouth 2 (two) times daily.    Marland Kitchen glimepiride (AMARYL) 4 MG tablet Take 4 mg by mouth 2 (two) times daily.     Marland Kitchen HYDROCODONE BITARTRATE ER PO Take by mouth as needed. twice a day    . HYDROcodone-acetaminophen (NORCO) 7.5-325 MG per tablet Take 1 tablet by mouth every 6 (six) hours.  0  . isosorbide dinitrate (ISORDIL) 10 MG tablet Take 10 mg by mouth every morning.  1  . metFORMIN (GLUCOPHAGE) 1000 MG tablet Take 1,000 mg by mouth 2 (two) times daily with a meal. Takes two tablets.    . metoprolol succinate (TOPROL-XL) 25 MG 24 hr tablet TAKE 2 TABLETS EVERY DAY 180 tablet 2  . Multiple Vitamin (MULTIVITAMIN) tablet Take 1 tablet by mouth daily.    . nitroGLYCERIN (NITROSTAT) 0.4 MG SL tablet Place 1 tablet (0.4 mg total) under the tongue every 5 (five) minutes as needed for chest pain. 25 tablet 3  . Omega-3 Fatty Acids (FISH OIL) 1200 MG CAPS Take 1  capsule by mouth 3 (three) times daily.     . pravastatin (PRAVACHOL) 80 MG tablet TAKE 1 TABLET EVERY DAY 90 tablet 2  . ramipril (ALTACE) 2.5 MG capsule TAKE 1 CAPSULE EVERY DAY 90 capsule 2   No facility-administered medications prior to visit.      Allergies:   Review of patient's allergies indicates no known allergies.   Social History   Social History  . Marital status: Married    Spouse name: N/A  . Number of children: N/A  . Years of education: N/A   Social History Main Topics  . Smoking status: Never Smoker  . Smokeless tobacco: None  . Alcohol use None  . Drug use: Unknown  . Sexual activity: Not Asked   Other Topics  Concern  . None   Social History Narrative   Has 3 children   Has 2 grandchildren     Family History:  The patient's family history includes Coronary artery disease (age of onset: 57) in his brother; Coronary artery disease (age of onset: 56) in his father.   ROS:   Please see the history of present illness.    ROS All other systems reviewed and are negative.   PHYSICAL EXAM:   VS:  BP 131/73   Pulse 81   Ht 6' (1.829 m)   Wt 224 lb (101.6 kg)   SpO2 97%   BMI 30.38 kg/m    GEN: Well nourished, well developed, in no acute distress  HEENT: normal  Neck: no JVD, carotid bruits, or masses Cardiac: RRR; no murmurs, rubs, or gallops,no edema  Respiratory:  clear to auscultation bilaterally, normal work of breathing GI: soft, nontender, nondistended, + BS MS: no deformity or atrophy  Skin: warm and dry, no rash Neuro:  Alert and Oriented x 3, Strength and sensation are intact Psych: euthymic mood, full affect  Wt Readings from Last 3 Encounters:  04/09/16 224 lb (101.6 kg)  01/07/16 222 lb (100.7 kg)  11/29/14 220 lb 3.2 oz (99.9 kg)      Studies/Labs Reviewed:   EKG:  EKG is ordered today.  The ekg ordered today demonstrates Normal sinus rhythm, normal tracing, QTC 409 ms  Recent Labs: July 2017 glucose 197, hemoglobin 13.9, creatinine 0.92, BUN 22, platelets 191 (Dr. Gerarda Fraction)  Lipid Panel    Component Value Date/Time   CHOL 158 03/14/2015 0705   TRIG 281 (H) 03/14/2015 0705   HDL 28 (L) 03/14/2015 0705   CHOLHDL 5.6 (H) 03/14/2015 0705   VLDL 56 (H) 03/14/2015 0705   LDLCALC 74 03/14/2015 0705   July 2017: LDL cholesterol 78, total cholesterol 165, but also triglycerides 244 and borderline HDL 38.  ASSESSMENT:    1. CAD with exertional angina ([place])   2. PAD (peripheral artery disease) ([place])   3. Type 2 diabetes mellitus with diabetic peripheral angiopathy without gangrene, without long-term current use of insulin ([place])   4. Mixed hyperlipidemia   5.  Pre-procedure lab exam   6. Blood clotting disorder ([facility])   7. Other fatigue      PLAN:  In order of problems listed above:  1. CAD: He describes typical stable angina pectoris. Spite restarting beta blockers and nitrates his symptoms persist. He understands the difference between stable and unstable symptoms of coronary insufficiency. He should seek immediate attention if he develops accelerating pattern of angina (lower angina threshold, prolonged episodes, angina at rest, etc.). Since he has well-established coronary disease, we'll proceed directly to angiography rather than  another stress test. Continue aspirin, clopidogrel, statin and beta blocker. Hold ramipril and metformin on day of cath. This procedure has been fully reviewed with the patient and written informed consent has been obtained. Scheduled for Tuesday with Dr. Peter Martinique. 2. PAD: Asymptomatic 3. DM: Borderline control, target A1c less than 7% 4. HLP: Mildly elevated triglycerides and low HDL should improve with better glycemic control. LDL and total cholesterol parameters close to target.    Medication Adjustments/Labs and Tests Ordered: Current medicines are reviewed at length with the patient today.  Concerns regarding medicines are outlined above.  Medication changes, Labs and Tests ordered today are listed in the Patient Instructions below. Patient Instructions  Dr Sallyanne Kuster has requested that you have a left cardiac catheterization with Dr Peter Martinique on Tuesday, November 7th. Cardiac catheterization is used to diagnose and/or treat various heart conditions. Doctors may recommend this procedure for a number of different reasons. The most common reason is to evaluate chest pain. Chest pain can be a symptom of coronary artery disease (CAD), and cardiac catheterization can show whether plaque is narrowing or blocking your heart's arteries. This procedure is also used to evaluate the valves, as well as measure the blood flow  and oxygen levels in different parts of your heart. For further information please visit [email or web address].   Following your catheterization, you will not be allowed to drive for 3 days.  No lifting, pushing, or pulling greater that 10 pounds is allowed for 1 week.  You will be required to have the following tests prior to the procedure:  1. Blood work - the blood work can be done no more than 7 days prior to the procedure. It can be done at any [facility] lab.  There is one downstairs on the first floor of this building and one in the [facility] building [phone]).   On the day of the procedure please HOLD the following medications:  >Metformin  >Ramipril  Continue taking Clopidogrel and Metoprolol.    Signed, Sanda Klein, MD  04/09/2016 10:18 AM    [facility] [place], [place], [place]  [address] Phone: [phone]; Fax: [phone]

## 2016-04-10 LAB — BASIC METABOLIC PANEL
BUN: 24 mg/dL (ref 7–25)
CHLORIDE: 103 mmol/L (ref 98–110)
CO2: 22 mmol/L (ref 20–31)
Calcium: 9.8 mg/dL (ref 8.6–10.3)
Creat: 1.19 mg/dL (ref 0.70–1.25)
Glucose, Bld: 314 mg/dL — ABNORMAL HIGH (ref 65–99)
POTASSIUM: 4.9 mmol/L (ref 3.5–5.3)
SODIUM: 138 mmol/L (ref 135–146)

## 2016-04-13 ENCOUNTER — Other Ambulatory Visit: Payer: Self-pay | Admitting: Cardiovascular Disease

## 2016-04-13 DIAGNOSIS — I25118 Atherosclerotic heart disease of native coronary artery with other forms of angina pectoris: Secondary | ICD-10-CM

## 2016-04-14 ENCOUNTER — Encounter (HOSPITAL_COMMUNITY)
Admission: RE | Disposition: A | Discharge status: Home / Self Care | Payer: Self-pay | Source: Ambulatory Visit | Attending: Cardiology

## 2016-04-14 ENCOUNTER — Ambulatory Visit (HOSPITAL_COMMUNITY)
Admission: RE | Admit: 2016-04-14 | Discharge: 2016-04-14 | Disposition: A | Discharge status: Home / Self Care | Payer: Commercial Managed Care - HMO | Source: Ambulatory Visit | Attending: Cardiology | Admitting: Cardiology

## 2016-04-14 DIAGNOSIS — Z7984 Long term (current) use of oral hypoglycemic drugs: Secondary | ICD-10-CM | POA: Diagnosis not present

## 2016-04-14 DIAGNOSIS — Z8249 Family history of ischemic heart disease and other diseases of the circulatory system: Secondary | ICD-10-CM | POA: Diagnosis not present

## 2016-04-14 DIAGNOSIS — E782 Mixed hyperlipidemia: Secondary | ICD-10-CM | POA: Insufficient documentation

## 2016-04-14 DIAGNOSIS — I25110 Atherosclerotic heart disease of native coronary artery with unstable angina pectoris: Secondary | ICD-10-CM | POA: Diagnosis not present

## 2016-04-14 DIAGNOSIS — I25119 Atherosclerotic heart disease of native coronary artery with unspecified angina pectoris: Secondary | ICD-10-CM

## 2016-04-14 DIAGNOSIS — E1151 Type 2 diabetes mellitus with diabetic peripheral angiopathy without gangrene: Secondary | ICD-10-CM | POA: Diagnosis not present

## 2016-04-14 DIAGNOSIS — I2510 Atherosclerotic heart disease of native coronary artery without angina pectoris: Secondary | ICD-10-CM | POA: Diagnosis present

## 2016-04-14 DIAGNOSIS — Z6830 Body mass index (BMI) 30.0-30.9, adult: Secondary | ICD-10-CM | POA: Insufficient documentation

## 2016-04-14 DIAGNOSIS — I10 Essential (primary) hypertension: Secondary | ICD-10-CM | POA: Diagnosis not present

## 2016-04-14 DIAGNOSIS — E669 Obesity, unspecified: Secondary | ICD-10-CM | POA: Diagnosis not present

## 2016-04-14 DIAGNOSIS — Z01818 Encounter for other preprocedural examination: Secondary | ICD-10-CM | POA: Insufficient documentation

## 2016-04-14 DIAGNOSIS — Z7982 Long term (current) use of aspirin: Secondary | ICD-10-CM | POA: Diagnosis not present

## 2016-04-14 DIAGNOSIS — R079 Chest pain, unspecified: Secondary | ICD-10-CM | POA: Diagnosis present

## 2016-04-14 DIAGNOSIS — I25118 Atherosclerotic heart disease of native coronary artery with other forms of angina pectoris: Secondary | ICD-10-CM

## 2016-04-14 DIAGNOSIS — E785 Hyperlipidemia, unspecified: Secondary | ICD-10-CM | POA: Diagnosis present

## 2016-04-14 DIAGNOSIS — E119 Type 2 diabetes mellitus without complications: Secondary | ICD-10-CM

## 2016-04-14 HISTORY — PX: CARDIAC CATHETERIZATION: SHX172

## 2016-04-14 LAB — GLUCOSE, CAPILLARY
GLUCOSE-CAPILLARY: 211 mg/dL — AB (ref 65–99)
GLUCOSE-CAPILLARY: 126 mg/dL — AB (ref 65–99)

## 2016-04-14 SURGERY — LEFT HEART CATH AND CORONARY ANGIOGRAPHY
Anesthesia: LOCAL

## 2016-04-14 MED ORDER — VERAPAMIL HCL 2.5 MG/ML IV SOLN
INTRAVENOUS | Status: AC
  Filled 2016-04-14: qty 2

## 2016-04-14 MED ORDER — LIDOCAINE HCL (PF) 1 % IJ SOLN
INTRAMUSCULAR | Status: DC | PRN
  Administered 2016-04-14: 2 mL

## 2016-04-14 MED ORDER — FENTANYL CITRATE (PF) 100 MCG/2ML IJ SOLN
INTRAMUSCULAR | Status: DC | PRN
  Administered 2016-04-14: 25 ug via INTRAVENOUS

## 2016-04-14 MED ORDER — IOPAMIDOL (ISOVUE-370) INJECTION 76%
INTRAVENOUS | Status: AC
  Filled 2016-04-14: qty 100

## 2016-04-14 MED ORDER — SODIUM CHLORIDE 0.9 % WEIGHT BASED INFUSION
3.0000 mL/kg/h | INTRAVENOUS | Status: DC
  Administered 2016-04-14: 3 mL/kg/h via INTRAVENOUS

## 2016-04-14 MED ORDER — ASPIRIN 81 MG PO CHEW
CHEWABLE_TABLET | ORAL | Status: AC
  Administered 2016-04-14: 81 mg via ORAL
  Filled 2016-04-14: qty 1

## 2016-04-14 MED ORDER — HEPARIN SODIUM (PORCINE) 1000 UNIT/ML IJ SOLN
INTRAMUSCULAR | Status: AC
  Filled 2016-04-14: qty 1

## 2016-04-14 MED ORDER — HEPARIN (PORCINE) IN NACL 2-0.9 UNIT/ML-% IJ SOLN
INTRAMUSCULAR | Status: AC
  Filled 2016-04-14: qty 500

## 2016-04-14 MED ORDER — MIDAZOLAM HCL 2 MG/2ML IJ SOLN
INTRAMUSCULAR | Status: AC
  Filled 2016-04-14: qty 2

## 2016-04-14 MED ORDER — VERAPAMIL HCL 2.5 MG/ML IV SOLN
INTRAVENOUS | Status: DC | PRN
  Administered 2016-04-14: 10 mL via INTRA_ARTERIAL

## 2016-04-14 MED ORDER — HEPARIN (PORCINE) IN NACL 2-0.9 UNIT/ML-% IJ SOLN
INTRAMUSCULAR | Status: DC | PRN
Start: 2016-04-14 — End: 2016-04-14
  Administered 2016-04-14: 1500 mL

## 2016-04-14 MED ORDER — ASPIRIN 81 MG PO CHEW
81.0000 mg | CHEWABLE_TABLET | ORAL | Status: AC
  Administered 2016-04-14: 81 mg via ORAL

## 2016-04-14 MED ORDER — HEPARIN SODIUM (PORCINE) 1000 UNIT/ML IJ SOLN
INTRAMUSCULAR | Status: DC | PRN
Start: 2016-04-14 — End: 2016-04-14
  Administered 2016-04-14: 5000 [IU] via INTRAVENOUS

## 2016-04-14 MED ORDER — MIDAZOLAM HCL 2 MG/2ML IJ SOLN
INTRAMUSCULAR | Status: DC | PRN
  Administered 2016-04-14: 1 mg via INTRAVENOUS

## 2016-04-14 MED ORDER — SODIUM CHLORIDE 0.9 % WEIGHT BASED INFUSION: 1.0000 mL/kg/h | INTRAVENOUS | Status: DC

## 2016-04-14 MED ORDER — SODIUM CHLORIDE 0.9% FLUSH: 3.0000 mL | INTRAVENOUS | Status: DC | PRN

## 2016-04-14 MED ORDER — METFORMIN HCL 500 MG PO TABS
1000.0000 mg | ORAL_TABLET | Freq: Two times a day (BID) | ORAL | Status: DC
Start: 2016-04-17 — End: 2016-04-14

## 2016-04-14 MED ORDER — FENTANYL CITRATE (PF) 100 MCG/2ML IJ SOLN
INTRAMUSCULAR | Status: AC
  Filled 2016-04-14: qty 2

## 2016-04-14 MED ORDER — SODIUM CHLORIDE 0.9% FLUSH: 3.0000 mL | Freq: Two times a day (BID) | INTRAVENOUS | Status: DC

## 2016-04-14 MED ORDER — SODIUM CHLORIDE 0.9 % WEIGHT BASED INFUSION: 3.0000 mL/kg/h | INTRAVENOUS | Status: AC

## 2016-04-14 MED ORDER — SODIUM CHLORIDE 0.9 % IV SOLN: 250.0000 mL | INTRAVENOUS | Status: DC | PRN

## 2016-04-14 SURGICAL SUPPLY — 12 items
CATH INFINITI 5 FR JL3.5 (CATHETERS) ×2 IMPLANT
CATH INFINITI 5FR ANG PIGTAIL (CATHETERS) ×2 IMPLANT
CATH INFINITI JR4 5F (CATHETERS) ×2 IMPLANT
DEVICE RAD COMP TR BAND LRG (VASCULAR PRODUCTS) ×2 IMPLANT
GLIDESHEATH SLEND SS 6F .021 (SHEATH) ×2 IMPLANT
KIT HEART LEFT (KITS) ×2 IMPLANT
PACK CARDIAC CATHETERIZATION (CUSTOM PROCEDURE TRAY) ×2 IMPLANT
SYR MEDRAD MARK V 150ML (SYRINGE) ×2 IMPLANT
TRANSDUCER W/STOPCOCK (MISCELLANEOUS) ×2 IMPLANT
TUBING CIL FLEX 10 FLL-RA (TUBING) ×2 IMPLANT
WIRE EMERALD 3MM-J .035X260CM (WIRE) ×2 IMPLANT
WIRE HI TORQ VERSACORE-J 145CM (WIRE) ×2 IMPLANT

## 2016-04-14 NOTE — Interval H&P Note (Signed)
History and Physical Interval Note:  04/14/2016 12:37 PM  Jonathan Shepard  has presented today for surgery, with the diagnosis of chest pain  The various methods of treatment have been discussed with the patient and family. After consideration of risks, benefits and other options for treatment, the patient has consented to  Procedure(s): Left Heart Cath and Coronary Angiography (N/A) as a surgical intervention .  The patient's history has been reviewed, patient examined, no change in status, stable for surgery.  I have reviewed the patient's chart and labs.  Questions were answered to the patient's satisfaction.   Cath Lab Visit (complete for each Cath Lab visit)  Clinical Evaluation Leading to the Procedure:   ACS: No.  Non-ACS:    Anginal Classification: CCS III  Anti-ischemic medical therapy: Maximal Therapy (2 or more classes of medications)  Non-Invasive Test Results: No non-invasive testing performed  Prior CABG: No previous CABG        Jonathan Shepard [facility] 04/14/2016 12:38 PM

## 2016-04-14 NOTE — Progress Notes (Signed)
Thank you, [name]. I agree that bypass surgery is the best option.

## 2016-04-14 NOTE — H&P (View-Only) (Signed)
Cardiology Office Note    Date:  04/09/2016   ID:  Jonathan Shepard, DOB 02-09-1951, MRN [id number]  PCP:  [name]., MD  Cardiologist:   Sanda Klein, MD   Chief Complaint  Patient presents with  . Follow-up    pt states, symptoms have not improved DOE,     History of Present Illness:  Jonathan Shepard is a 65 y.o. male with long-standing history of coronary artery disease who presents with complaints of stable angina pectoris. Recently this has worsened. Initially it seemed to to coincide with temporary interruption in his beta blocker. Even after restarting the beta blocker and taking long acting nitrates he still has CCS class II exertional dyspnea. It happens if he climbs a flight of stairs or walks to his mailbox, especially if he does that after a meal. It still resolves promptly with rest. He is having some headaches, that seem to worsen when he is having chest pain.  His most recent hemoglobin A1c was slightly elevated at 7.1%. His lipid profile from July is fair, shows LDL cholesterol 78 total cholesterol 165, but also triglycerides 244 and borderline HDL 38.  Initial presentation in 2002 - mid right coronary Hepacoat 3.0x13 stent.  In May 2008 had restenosis in the right coronary artery stent and "sandwich stenting" - Liberte 3.75 stent.  In December 2008 had cutting balloon angioplasty for in stent restenosis. In the same setting received a drug-eluting 3 x 15 mm Promus stent in the proximal LAD artery for a de novo lesion. July 2014 a nuclear stress test performed for similar complaints of exertional discomfort was normal.  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   Past Medical History:  Diagnosis Date  . CAD (coronary artery disease)   . Diabetes ([place])   . Hyperlipidemia   . Hypertension   . Obesity     No past surgical history on  file.  Current Medications: Outpatient Medications Prior to Visit  Medication Sig Dispense Refill  . aspirin EC 81 MG tablet Take 81 mg by mouth daily.    . Choline Fenofibrate (FENOFIBRIC ACID) 45 MG CPDR TAKE 1 CAPSULE ONE TIME DAILY 90 capsule 2  . clopidogrel (PLAVIX) 75 MG tablet TAKE 1 TABLET DAILY (NEEDS APPOINTMENT FOR REFILLS.) 90 tablet 2  . diclofenac (VOLTAREN) 75 MG EC tablet Take 1 tablet by mouth 2 (two) times daily.    Marland Kitchen glimepiride (AMARYL) 4 MG tablet Take 4 mg by mouth 2 (two) times daily.     Marland Kitchen HYDROCODONE BITARTRATE ER PO Take by mouth as needed. twice a day    . HYDROcodone-acetaminophen (NORCO) 7.5-325 MG per tablet Take 1 tablet by mouth every 6 (six) hours.  0  . isosorbide dinitrate (ISORDIL) 10 MG tablet Take 10 mg by mouth every morning.  1  . metFORMIN (GLUCOPHAGE) 1000 MG tablet Take 1,000 mg by mouth 2 (two) times daily with a meal. Takes two tablets.    . metoprolol succinate (TOPROL-XL) 25 MG 24 hr tablet TAKE 2 TABLETS EVERY DAY 180 tablet 2  . Multiple Vitamin (MULTIVITAMIN) tablet Take 1 tablet by mouth daily.    . nitroGLYCERIN (NITROSTAT) 0.4 MG SL tablet Place 1 tablet (0.4 mg total) under the tongue every 5 (five) minutes as needed for chest pain. 25 tablet 3  . Omega-3 Fatty Acids (FISH OIL) 1200 MG CAPS Take 1  capsule by mouth 3 (three) times daily.     . pravastatin (PRAVACHOL) 80 MG tablet TAKE 1 TABLET EVERY DAY 90 tablet 2  . ramipril (ALTACE) 2.5 MG capsule TAKE 1 CAPSULE EVERY DAY 90 capsule 2   No facility-administered medications prior to visit.      Allergies:   Review of patient's allergies indicates no known allergies.   Social History   Social History  . Marital status: Married    Spouse name: N/A  . Number of children: N/A  . Years of education: N/A   Social History Main Topics  . Smoking status: Never Smoker  . Smokeless tobacco: None  . Alcohol use None  . Drug use: Unknown  . Sexual activity: Not Asked   Other Topics  Concern  . None   Social History Narrative   Has 3 children   Has 2 grandchildren     Family History:  The patient's family history includes Coronary artery disease (age of onset: 56) in his brother; Coronary artery disease (age of onset: 30) in his father.   ROS:   Please see the history of present illness.    ROS All other systems reviewed and are negative.   PHYSICAL EXAM:   VS:  BP 131/73   Pulse 81   Ht 6' (1.829 m)   Wt 224 lb (101.6 kg)   SpO2 97%   BMI 30.38 kg/m    GEN: Well nourished, well developed, in no acute distress  HEENT: normal  Neck: no JVD, carotid bruits, or masses Cardiac: RRR; no murmurs, rubs, or gallops,no edema  Respiratory:  clear to auscultation bilaterally, normal work of breathing GI: soft, nontender, nondistended, + BS MS: no deformity or atrophy  Skin: warm and dry, no rash Neuro:  Alert and Oriented x 3, Strength and sensation are intact Psych: euthymic mood, full affect  Wt Readings from Last 3 Encounters:  04/09/16 224 lb (101.6 kg)  01/07/16 222 lb (100.7 kg)  11/29/14 220 lb 3.2 oz (99.9 kg)      Studies/Labs Reviewed:   EKG:  EKG is ordered today.  The ekg ordered today demonstrates Normal sinus rhythm, normal tracing, QTC 409 ms  Recent Labs: July 2017 glucose 197, hemoglobin 13.9, creatinine 0.92, BUN 22, platelets 191 (Dr. Gerarda Fraction)  Lipid Panel    Component Value Date/Time   CHOL 158 03/14/2015 0705   TRIG 281 (H) 03/14/2015 0705   HDL 28 (L) 03/14/2015 0705   CHOLHDL 5.6 (H) 03/14/2015 0705   VLDL 56 (H) 03/14/2015 0705   LDLCALC 74 03/14/2015 0705   July 2017: LDL cholesterol 78, total cholesterol 165, but also triglycerides 244 and borderline HDL 38.  ASSESSMENT:    1. CAD with exertional angina ([place])   2. PAD (peripheral artery disease) ([place])   3. Type 2 diabetes mellitus with diabetic peripheral angiopathy without gangrene, without long-term current use of insulin ([place])   4. Mixed hyperlipidemia   5.  Pre-procedure lab exam   6. Blood clotting disorder (H[facility])   7. Other fatigue      PLAN:  In order of problems listed above:  1. CAD: He describes typical stable angina pectoris. Spite restarting beta blockers and nitrates his symptoms persist. He understands the difference between stable and unstable symptoms of coronary insufficiency. He should seek immediate attention if he develops accelerating pattern of angina (lower angina threshold, prolonged episodes, angina at rest, etc.). Since he has well-established coronary disease, we'll proceed directly to angiography rather than  another stress test. Continue aspirin, clopidogrel, statin and beta blocker. Hold ramipril and metformin on day of cath. This procedure has been fully reviewed with the patient and written informed consent has been obtained. Scheduled for Tuesday with Dr. Peter Martinique. 2. PAD: Asymptomatic 3. DM: Borderline control, target A1c less than 7% 4. HLP: Mildly elevated triglycerides and low HDL should improve with better glycemic control. LDL and total cholesterol parameters close to target.    Medication Adjustments/Labs and Tests Ordered: Current medicines are reviewed at length with the patient today.  Concerns regarding medicines are outlined above.  Medication changes, Labs and Tests ordered today are listed in the Patient Instructions below. Patient Instructions  Dr Sallyanne Kuster has requested that you have a left cardiac catheterization with Dr Peter Martinique on Tuesday, November 7th. Cardiac catheterization is used to diagnose and/or treat various heart conditions. Doctors may recommend this procedure for a number of different reasons. The most common reason is to evaluate chest pain. Chest pain can be a symptom of coronary artery disease (CAD), and cardiac catheterization can show whether plaque is narrowing or blocking your heart's arteries. This procedure is also used to evaluate the valves, as well as measure the blood flow  and oxygen levels in different parts of your heart. For further information please visit [email or web address].   Following your catheterization, you will not be allowed to drive for 3 days.  No lifting, pushing, or pulling greater that 10 pounds is allowed for 1 week.  You will be required to have the following tests prior to the procedure:  1. Blood work - the blood work can be done no more than 7 days prior to the procedure. It can be done at any [facility] lab.  There is one downstairs on the first floor of this building and one in the [facility] building [phone]).   On the day of the procedure please HOLD the following medications:  >Metformin  >Ramipril  Continue taking Clopidogrel and Metoprolol.    Signed, Sanda Klein, MD  04/09/2016 10:18 AM    [facility] [place] [place], [place],   [address] Phone: [phone]; Fax: [phone]

## 2016-04-14 NOTE — Discharge Instructions (Signed)
NO METFORMIN/GLUCOPHAGE FOR 2 DAYS ° ° ° °Radial Site Care °Refer to this sheet in the next few weeks. These instructions provide you with information about caring for yourself after your procedure. Your health care provider may also give you more specific instructions. Your treatment has been planned according to current medical practices, but problems sometimes occur. Call your health care provider if you have any problems or questions after your procedure. °WHAT TO EXPECT AFTER THE PROCEDURE °After your procedure, it is typical to have the following: °· Bruising at the radial site that usually fades within 1-2 weeks. °· Blood collecting in the tissue (hematoma) that may be painful to the touch. It should usually decrease in size and tenderness within 1-2 weeks. °HOME CARE INSTRUCTIONS °· Take medicines only as directed by your health care provider. °· You may shower 24-48 hours after the procedure or as directed by your health care provider. Remove the bandage (dressing) and gently wash the site with plain soap and water. Pat the area dry with a clean towel. Do not rub the site, because this may cause bleeding. °· Do not take baths, swim, or use a hot tub until your health care provider approves. °· Check your insertion site every day for redness, swelling, or drainage. °· Do not apply powder or lotion to the site. °· Do not flex or bend the affected arm for 24 hours or as directed by your health care provider. °· Do not push or pull heavy objects with the affected arm for 24 hours or as directed by your health care provider. °· Do not lift over 10 lb (4.5 kg) for 5 days after your procedure or as directed by your health care provider. °· Ask your health care provider when it is okay to: °¨ Return to work or school. °¨ Resume usual physical activities or sports. °¨ Resume sexual activity. °· Do not drive home if you are discharged the same day as the procedure. Have someone else drive you. °· You may drive 24  hours after the procedure unless otherwise instructed by your health care provider. °· Do not operate machinery or power tools for 24 hours after the procedure. °· If your procedure was done as an outpatient procedure, which means that you went home the same day as your procedure, a responsible adult should be with you for the first 24 hours after you arrive home. °· Keep all follow-up visits as directed by your health care provider. This is important. °SEEK MEDICAL CARE IF: °· You have a fever. °· You have chills. °· You have increased bleeding from the radial site. Hold pressure on the site. °SEEK IMMEDIATE MEDICAL CARE IF: °· You have unusual pain at the radial site. °· You have redness, warmth, or swelling at the radial site. °· You have drainage (other than a small amount of blood on the dressing) from the radial site. °· The radial site is bleeding, and the bleeding does not stop after 30 minutes of holding steady pressure on the site. °· Your arm or hand becomes pale, cool, tingly, or numb. °  °This information is not intended to replace advice given to you by your health care provider. Make sure you discuss any questions you have with your health care provider. °  °Document Released: 06/27/2010 Document Revised: 06/15/2014 Document Reviewed: 12/11/2013 °Elsevier Interactive Patient Education ©2016 [employer] ° °

## 2016-04-15 ENCOUNTER — Encounter (HOSPITAL_COMMUNITY): Payer: Self-pay | Admitting: Cardiology

## 2016-04-15 ENCOUNTER — Institutional Professional Consult (permissible substitution) (INDEPENDENT_AMBULATORY_CARE_PROVIDER_SITE_OTHER): Payer: Commercial Managed Care - HMO | Admitting: Thoracic Surgery (Cardiothoracic Vascular Surgery)

## 2016-04-15 ENCOUNTER — Other Ambulatory Visit: Payer: Self-pay | Admitting: *Deleted

## 2016-04-15 ENCOUNTER — Encounter: Payer: Self-pay | Admitting: *Deleted

## 2016-04-15 VITALS — BP 141/84 | HR 65 | Resp 16 | Ht 72.0 in | Wt 225.0 lb

## 2016-04-15 DIAGNOSIS — I2510 Atherosclerotic heart disease of native coronary artery without angina pectoris: Secondary | ICD-10-CM

## 2016-04-15 DIAGNOSIS — I209 Angina pectoris, unspecified: Secondary | ICD-10-CM | POA: Diagnosis not present

## 2016-04-15 NOTE — Progress Notes (Signed)
PCP is [name]., MD Referring Provider is Martinique, Peter M, MD  Chief Complaint  Patient presents with  . Coronary Artery Disease    Surgical eval, Cardiac Cath 04/14/16    HPI: [name] is sent for consultation regarding three-vessel coronary disease.  [name] is a 65 year old man with a past medical history significant for mixed hyperlipidemia, hypertension, and type 2 diabetes. He has a strong family history of coronary disease with both his father and brother having had CABG. He has a history of coronary disease dating back to 2002 when he had stents placed. He had additional stenting done in 2008.  He recently saw [name] with a complaint of throat discomfort with exertion. He recommended cardiac catheterization which was done yesterday by Dr. Martinique. It showed severe three-vessel disease with a 70% LAD stenosis, 90% stenosis of the left circumflex, and a totally occluded right coronary. Ejection fraction was approximately 50%.  He tells me that he has discomfort in his upper chest with exertion. If he continues to exert himself but radiates to both arms. He has been stopping his activity when he first feels the discomfort so he has not had any arm pain recently. He did have upper chest discomfort walking into the office building this morning. He says this is not a pressure, tightness or pain. He sometimes experiences it after eating. He had been on medication for reflux but he stopped that and didn't notice any difference so he no longer takes that medicine.  Past Medical History:  Diagnosis Date  . CAD (coronary artery disease)   . Diabetes ([place])   . Hyperlipidemia   . Hypertension   . Obesity     Past Surgical History:  Procedure Laterality Date  . CARDIAC CATHETERIZATION N/A 04/14/2016   Procedure: Left Heart Cath and Coronary Angiography;  Surgeon: Peter M Martinique, MD;  Location: [facility];  Service: Cardiovascular;  Laterality: N/A;    Family  History  Problem Relation Age of Onset  . Coronary artery disease Father 47    CABG  . Coronary artery disease Brother 45    CABG    Social History Social History  Substance Use Topics  . Smoking status: Never Smoker  . Smokeless tobacco: Not on file  . Alcohol use Not on file    Current Outpatient Prescriptions  Medication Sig Dispense Refill  . aspirin EC 81 MG tablet Take 81 mg by mouth at bedtime.     . Choline Fenofibrate (FENOFIBRIC ACID) 45 MG CPDR TAKE 1 CAPSULE ONE TIME DAILY (Patient taking differently: take 45mg s at bedtime) 90 capsule 2  . clopidogrel (PLAVIX) 75 MG tablet TAKE 1 TABLET DAILY (NEEDS APPOINTMENT FOR REFILLS.) 90 tablet 2  . Coenzyme Q10 (CO Q10 PO) Take 30 mg by mouth daily.     . diclofenac (VOLTAREN) 75 MG EC tablet Take 1 tablet by mouth 2 (two) times daily.    Marland Kitchen glimepiride (AMARYL) 4 MG tablet Take 4 mg by mouth 2 (two) times daily.     Marland Kitchen HYDROcodone-acetaminophen (NORCO) 10-325 MG tablet Take 1 tablet by mouth every 8 (eight) hours as needed for moderate pain.    Marland Kitchen ibuprofen (ADVIL,MOTRIN) 200 MG tablet Take 200-400 mg by mouth every 6 (six) hours as needed for headache.    . isosorbide dinitrate (ISORDIL) 10 MG tablet Take 10 mg by mouth every morning.  1  . metFORMIN (GLUCOPHAGE) 1000 MG tablet Take 1,000 mg by mouth 2 (two) times daily  with a meal.    . metoprolol succinate (TOPROL-XL) 25 MG 24 hr tablet TAKE 2 TABLETS EVERY DAY (Patient taking differently: take 25mg s twice daily) 180 tablet 2  . Multiple Vitamin (MULTIVITAMIN) tablet Take 1 tablet by mouth daily.    . nitroGLYCERIN (NITROSTAT) 0.4 MG SL tablet Place 1 tablet (0.4 mg total) under the tongue every 5 (five) minutes as needed for chest pain. 25 tablet 3  . Omega-3 Fatty Acids (FISH OIL) 1200 MG CAPS Take 1 capsule by mouth 2 (two) times daily.     . pravastatin (PRAVACHOL) 80 MG tablet TAKE 1 TABLET EVERY DAY (Patient taking differently: take 80mg s at bedtime) 90 tablet 2  . ramipril  (ALTACE) 2.5 MG capsule TAKE 1 CAPSULE EVERY DAY 90 capsule 2   No current facility-administered medications for this visit.     No Known Allergies  Review of Systems  Constitutional: Positive for activity change and fatigue. Negative for unexpected weight change.  HENT: Negative for trouble swallowing.   Eyes: Negative for visual disturbance.  Respiratory: Positive for shortness of breath. Negative for cough and wheezing.   Cardiovascular: Positive for chest pain. Negative for leg swelling.       Denies claudication  Gastrointestinal: Negative for abdominal pain and blood in stool.  Endocrine: Negative for polydipsia and polyphagia.  Genitourinary: Negative for difficulty urinating and dysuria.  Musculoskeletal: Positive for arthralgias (Psoriatic arthritis).       Leg cramps  Neurological: Positive for headaches. Negative for dizziness and syncope.  Hematological: Negative for adenopathy. Does not bruise/bleed easily.  All other systems reviewed and are negative.   BP (!) 141/84   Pulse 65   Resp 16   Ht 6' (1.829 m)   Wt 225 lb (102.1 kg)   SpO2 96% Comment: RA  BMI 30.52 kg/m  Physical Exam  Constitutional: He is oriented to person, place, and time. He appears well-developed and well-nourished. No distress.  HENT:  Head: Normocephalic and atraumatic.  Mouth/Throat: Oropharyngeal exudate present.  Eyes: Conjunctivae and EOM are normal. No scleral icterus.  Neck: Neck supple. No thyromegaly present.  No carotid bruits  Cardiovascular: Normal rate, regular rhythm and normal heart sounds.   Normal Allen's test on the left  Pulmonary/Chest: Effort normal and breath sounds normal. No respiratory distress. He has no wheezes. He has no rales.  Abdominal: Soft. He exhibits no distension. There is no tenderness.  Musculoskeletal: He exhibits no edema or deformity.  Lymphadenopathy:    He has no cervical adenopathy.  Neurological: He is alert and oriented to person, place, and  time. No cranial nerve deficit.  No focal motor deficit  Skin: Skin is warm and dry.     Diagnostic Tests: Cardiac catheterization Conclusion     There is mild left ventricular systolic dysfunction.  LV end diastolic pressure is normal.  The left ventricular ejection fraction is 45-50% by visual estimate.  Prox RCA to Mid RCA lesion, 100 %stenosed.  Ost RCA to Prox RCA lesion, 90 %stenosed.  Ost LAD to Prox LAD lesion, 0 %stenosed.  Prox LAD to Mid LAD lesion, 75 %stenosed.  Prox Cx to Mid Cx lesion, 90 %stenosed.   1. Severe 3 vessel obstructive CAD    - 75% mid LAD    - 90% mid LCx    - 100% RCA 2. Mild LV dysfunction with normal LVEDP  Plan: will discuss with [name]. I think his best option for revascularization would be CABG. Alternative would be to  stent the LAD and LCx and treat the RCA medically but this is a large vessel supplying a large territory.       I personally reviewed the cardiac catheterization images and concur with the findings noted.  Impression: [name] is a 65 year old gentleman with multiple cardiac risk factors including a strong family history, diabetes, hyperlipidemia, and hypertension. He also has known coronary disease dating back to 2002. He now presents with exertional angina. At catheterization he was found to have severe three-vessel coronary disease with mild left ventricular dysfunction.  Coronary artery bypass grafting is indicated for survival benefit and relief of symptoms in the setting of severe three-vessel disease.  I discussed the general nature of the procedure, the need for general anesthesia, the use of cardiopulmonary bypass, and the incisions to be used with the patient and his wife. We discussed the expected hospital stay, overall recovery and short and long term outcomes. I reviewed the indications, risks, benefits, and alternatives. They understand the risks include, but are not limited to death, stroke,  MI, DVT/PE, bleeding, possible need for transfusion, infections, cardiac arrhythmias, as well as other organ system dysfunction including respiratory, renal, or GI complications. We also discussed the use of the left radial artery. He has a normal Allen's test. He does understand the risks associated with radial artery harvest including ischemia and paresthesias.  He understands and accepts the risks and agrees to proceed.  Hypertension- blood pressure mildly elevated today on good medical regimen.  Diabetes- CBG on 04/09/2016 was 314. Per Dr. Victorino December note most recent A1c was 7.1.  Hyperlipidemia- on statin  Plan: Coronary artery bypass grafting 4 with LIMA, left radial artery, and saphenous vein on Wednesday, 04/22/2016.  Melrose Nakayama, MD [facility] [phone]

## 2016-04-15 NOTE — Progress Notes (Signed)
Awesome. Thank you, [name]! Jonathan Shepard

## 2016-04-15 NOTE — Patient Instructions (Signed)
Stop clopidogrel (Plavix) after dose on Friday, 04/17/2016

## 2016-04-20 ENCOUNTER — Encounter (HOSPITAL_COMMUNITY)
Admission: RE | Admit: 2016-04-20 | Discharge: 2016-04-20 | Disposition: A | Discharge status: Home / Self Care | Payer: Commercial Managed Care - HMO | Source: Ambulatory Visit | Attending: Thoracic Surgery (Cardiothoracic Vascular Surgery) | Admitting: Thoracic Surgery (Cardiothoracic Vascular Surgery)

## 2016-04-20 ENCOUNTER — Ambulatory Visit (HOSPITAL_COMMUNITY): Admission: RE | Admit: 2016-04-20 | Payer: Commercial Managed Care - HMO | Source: Ambulatory Visit

## 2016-04-20 ENCOUNTER — Encounter (HOSPITAL_COMMUNITY): Payer: Self-pay

## 2016-04-20 ENCOUNTER — Ambulatory Visit (HOSPITAL_COMMUNITY)
Admission: RE | Admit: 2016-04-20 | Discharge: 2016-04-20 | Disposition: A | Discharge status: Home / Self Care | Payer: Commercial Managed Care - HMO | Source: Ambulatory Visit | Attending: Thoracic Surgery (Cardiothoracic Vascular Surgery) | Admitting: Thoracic Surgery (Cardiothoracic Vascular Surgery)

## 2016-04-20 ENCOUNTER — Ambulatory Visit (HOSPITAL_BASED_OUTPATIENT_CLINIC_OR_DEPARTMENT_OTHER)
Admission: RE | Admit: 2016-04-20 | Discharge: 2016-04-20 | Disposition: A | Discharge status: Home / Self Care | Payer: Commercial Managed Care - HMO | Source: Ambulatory Visit | Attending: Thoracic Surgery (Cardiothoracic Vascular Surgery) | Admitting: Thoracic Surgery (Cardiothoracic Vascular Surgery)

## 2016-04-20 DIAGNOSIS — Z7984 Long term (current) use of oral hypoglycemic drugs: Secondary | ICD-10-CM | POA: Diagnosis not present

## 2016-04-20 DIAGNOSIS — I25708 Atherosclerosis of coronary artery bypass graft(s), unspecified, with other forms of angina pectoris: Secondary | ICD-10-CM | POA: Diagnosis not present

## 2016-04-20 DIAGNOSIS — Z7902 Long term (current) use of antithrombotics/antiplatelets: Secondary | ICD-10-CM | POA: Diagnosis not present

## 2016-04-20 DIAGNOSIS — E782 Mixed hyperlipidemia: Secondary | ICD-10-CM | POA: Diagnosis not present

## 2016-04-20 DIAGNOSIS — Z7982 Long term (current) use of aspirin: Secondary | ICD-10-CM | POA: Diagnosis not present

## 2016-04-20 DIAGNOSIS — Z95818 Presence of other cardiac implants and grafts: Secondary | ICD-10-CM | POA: Diagnosis not present

## 2016-04-20 DIAGNOSIS — J9811 Atelectasis: Secondary | ICD-10-CM | POA: Diagnosis not present

## 2016-04-20 DIAGNOSIS — Z833 Family history of diabetes mellitus: Secondary | ICD-10-CM | POA: Diagnosis not present

## 2016-04-20 DIAGNOSIS — I2510 Atherosclerotic heart disease of native coronary artery without angina pectoris: Secondary | ICD-10-CM

## 2016-04-20 DIAGNOSIS — R000 Tachycardia, unspecified: Secondary | ICD-10-CM | POA: Diagnosis not present

## 2016-04-20 DIAGNOSIS — Z8249 Family history of ischemic heart disease and other diseases of the circulatory system: Secondary | ICD-10-CM | POA: Diagnosis not present

## 2016-04-20 DIAGNOSIS — R942 Abnormal results of pulmonary function studies: Secondary | ICD-10-CM | POA: Insufficient documentation

## 2016-04-20 DIAGNOSIS — D62 Acute posthemorrhagic anemia: Secondary | ICD-10-CM | POA: Diagnosis not present

## 2016-04-20 DIAGNOSIS — I25119 Atherosclerotic heart disease of native coronary artery with unspecified angina pectoris: Secondary | ICD-10-CM | POA: Diagnosis not present

## 2016-04-20 DIAGNOSIS — E860 Dehydration: Secondary | ICD-10-CM | POA: Diagnosis not present

## 2016-04-20 DIAGNOSIS — I452 Bifascicular block: Secondary | ICD-10-CM | POA: Diagnosis not present

## 2016-04-20 DIAGNOSIS — E1151 Type 2 diabetes mellitus with diabetic peripheral angiopathy without gangrene: Secondary | ICD-10-CM | POA: Diagnosis not present

## 2016-04-20 DIAGNOSIS — E785 Hyperlipidemia, unspecified: Secondary | ICD-10-CM | POA: Diagnosis not present

## 2016-04-20 DIAGNOSIS — I10 Essential (primary) hypertension: Secondary | ICD-10-CM | POA: Diagnosis not present

## 2016-04-20 DIAGNOSIS — E1159 Type 2 diabetes mellitus with other circulatory complications: Secondary | ICD-10-CM | POA: Diagnosis not present

## 2016-04-20 DIAGNOSIS — I2582 Chronic total occlusion of coronary artery: Secondary | ICD-10-CM | POA: Diagnosis present

## 2016-04-20 DIAGNOSIS — Z01810 Encounter for preprocedural cardiovascular examination: Secondary | ICD-10-CM | POA: Diagnosis not present

## 2016-04-20 DIAGNOSIS — Z951 Presence of aortocoronary bypass graft: Secondary | ICD-10-CM | POA: Diagnosis not present

## 2016-04-20 DIAGNOSIS — Z01818 Encounter for other preprocedural examination: Secondary | ICD-10-CM

## 2016-04-20 DIAGNOSIS — E11649 Type 2 diabetes mellitus with hypoglycemia without coma: Secondary | ICD-10-CM | POA: Diagnosis not present

## 2016-04-20 DIAGNOSIS — I25118 Atherosclerotic heart disease of native coronary artery with other forms of angina pectoris: Secondary | ICD-10-CM | POA: Diagnosis not present

## 2016-04-20 DIAGNOSIS — R0789 Other chest pain: Secondary | ICD-10-CM | POA: Diagnosis not present

## 2016-04-20 DIAGNOSIS — E8770 Fluid overload, unspecified: Secondary | ICD-10-CM | POA: Diagnosis not present

## 2016-04-20 DIAGNOSIS — R200 Anesthesia of skin: Secondary | ICD-10-CM | POA: Diagnosis not present

## 2016-04-20 DIAGNOSIS — Z6830 Body mass index (BMI) 30.0-30.9, adult: Secondary | ICD-10-CM | POA: Diagnosis not present

## 2016-04-20 DIAGNOSIS — R079 Chest pain, unspecified: Secondary | ICD-10-CM | POA: Diagnosis not present

## 2016-04-20 DIAGNOSIS — E669 Obesity, unspecified: Secondary | ICD-10-CM | POA: Diagnosis present

## 2016-04-20 DIAGNOSIS — E119 Type 2 diabetes mellitus without complications: Secondary | ICD-10-CM | POA: Diagnosis not present

## 2016-04-20 DIAGNOSIS — I25110 Atherosclerotic heart disease of native coronary artery with unstable angina pectoris: Secondary | ICD-10-CM | POA: Diagnosis not present

## 2016-04-20 DIAGNOSIS — R062 Wheezing: Secondary | ICD-10-CM | POA: Diagnosis not present

## 2016-04-20 DIAGNOSIS — K5900 Constipation, unspecified: Secondary | ICD-10-CM | POA: Diagnosis not present

## 2016-04-20 HISTORY — DX: Dyspnea, unspecified: R06.00

## 2016-04-20 LAB — PULMONARY FUNCTION TEST
DL/VA % pred: 92 %
DL/VA: 4.35 ml/min/mmHg/L
DLCO UNC % PRED: 64 %
DLCO UNC: 22.45 ml/min/mmHg
DLCO cor % pred: 64 %
DLCO cor: 22.58 ml/min/mmHg
FEF 25-75 POST: 3.31 L/s
FEF 25-75 PRE: 2.92 L/s
FEF2575-%Change-Post: 13 %
FEF2575-%Pred-Post: 109 %
FEF2575-%Pred-Pre: 97 %
FEV1-%CHANGE-POST: 2 %
FEV1-%PRED-PRE: 76 %
FEV1-%Pred-Post: 78 %
FEV1-POST: 2.95 L
FEV1-PRE: 2.87 L
FEV1FVC-%Change-Post: 10 %
FEV1FVC-%PRED-PRE: 106 %
FEV6-%CHANGE-POST: -5 %
FEV6-%PRED-POST: 69 %
FEV6-%Pred-Pre: 73 %
FEV6-POST: 3.34 L
FEV6-Pre: 3.53 L
FEV6FVC-%CHANGE-POST: 1 %
FEV6FVC-%PRED-POST: 105 %
FEV6FVC-%PRED-PRE: 103 %
FVC-%CHANGE-POST: -6 %
FVC-%Pred-Post: 66 %
FVC-%Pred-Pre: 71 %
FVC-Post: 3.34 L
FVC-Pre: 3.57 L
POST FEV1/FVC RATIO: 88 %
PRE FEV6/FVC RATIO: 99 %
Post FEV6/FVC ratio: 100 %
Pre FEV1/FVC ratio: 80 %
RV % PRED: 8 %
RV: 0.2 L
TLC % PRED: 56 %
TLC: 4.16 L

## 2016-04-20 LAB — URINALYSIS, ROUTINE W REFLEX MICROSCOPIC
Bilirubin Urine: NEGATIVE
Glucose, UA: NEGATIVE mg/dL
HGB URINE DIPSTICK: NEGATIVE
KETONES UR: NEGATIVE mg/dL
LEUKOCYTES UA: NEGATIVE
Nitrite: NEGATIVE
PROTEIN: NEGATIVE mg/dL
Specific Gravity, Urine: 1.006 (ref 1.005–1.030)
pH: 5.5 (ref 5.0–8.0)

## 2016-04-20 LAB — BLOOD GAS, ARTERIAL
ACID-BASE DEFICIT: 1.8 mmol/L (ref 0.0–2.0)
BICARBONATE: 22.3 mmol/L (ref 20.0–28.0)
FIO2: 21
O2 SAT: 95.9 %
PATIENT TEMPERATURE: 98.6
pCO2 arterial: 36.7 mmHg (ref 32.0–48.0)
pH, Arterial: 7.401 (ref 7.350–7.450)
pO2, Arterial: 84.4 mmHg (ref 83.0–108.0)

## 2016-04-20 LAB — COMPREHENSIVE METABOLIC PANEL
ALT: 68 U/L — ABNORMAL HIGH (ref 17–63)
ANION GAP: 11 (ref 5–15)
AST: 49 U/L — AB (ref 15–41)
Albumin: 4.3 g/dL (ref 3.5–5.0)
Alkaline Phosphatase: 56 U/L (ref 38–126)
BILIRUBIN TOTAL: 0.6 mg/dL (ref 0.3–1.2)
BUN: 21 mg/dL — AB (ref 6–20)
CO2: 22 mmol/L (ref 22–32)
Calcium: 10.1 mg/dL (ref 8.9–10.3)
Chloride: 105 mmol/L (ref 101–111)
Creatinine, Ser: 1.08 mg/dL (ref 0.61–1.24)
Glucose, Bld: 116 mg/dL — ABNORMAL HIGH (ref 65–99)
POTASSIUM: 3.9 mmol/L (ref 3.5–5.1)
Sodium: 138 mmol/L (ref 135–145)
TOTAL PROTEIN: 7.2 g/dL (ref 6.5–8.1)

## 2016-04-20 LAB — VAS US DOPPLER PRE CABG
LCCADDIAS: -15 cm/s
LCCAPSYS: 80 cm/s
LEFT ECA DIAS: -12 cm/s
LEFT VERTEBRAL DIAS: -12 cm/s
LICADSYS: -75 cm/s
Left CCA dist sys: -70 cm/s
Left CCA prox dias: 12 cm/s
Left ICA dist dias: -25 cm/s
Left ICA prox dias: -23 cm/s
Left ICA prox sys: -82 cm/s
RCCADSYS: -79 cm/s
RCCAPDIAS: 13 cm/s
RIGHT ECA DIAS: -10 cm/s
RIGHT VERTEBRAL DIAS: -7 cm/s
Right CCA prox sys: 80 cm/s

## 2016-04-20 LAB — CBC
HEMATOCRIT: 44 % (ref 39.0–52.0)
Hemoglobin: 14.6 g/dL (ref 13.0–17.0)
MCH: 30 pg (ref 26.0–34.0)
MCHC: 33.2 g/dL (ref 30.0–36.0)
MCV: 90.3 fL (ref 78.0–100.0)
PLATELETS: 190 10*3/uL (ref 150–400)
RBC: 4.87 MIL/uL (ref 4.22–5.81)
RDW: 12.9 % (ref 11.5–15.5)
WBC: 9.6 10*3/uL (ref 4.0–10.5)

## 2016-04-20 LAB — TYPE AND SCREEN
ABO/RH(D): O POS
Antibody Screen: NEGATIVE

## 2016-04-20 LAB — GLUCOSE, CAPILLARY: Glucose-Capillary: 116 mg/dL — ABNORMAL HIGH (ref 65–99)

## 2016-04-20 LAB — APTT: aPTT: 29 seconds (ref 24–36)

## 2016-04-20 LAB — SURGICAL PCR SCREEN
MRSA, PCR: NEGATIVE
Staphylococcus aureus: POSITIVE — AB

## 2016-04-20 LAB — PROTIME-INR
INR: 0.99
PROTHROMBIN TIME: 13.1 s (ref 11.4–15.2)

## 2016-04-20 LAB — ABO/RH: ABO/RH(D): O POS

## 2016-04-20 MED ORDER — ALBUTEROL SULFATE (2.5 MG/3ML) 0.083% IN NEBU
2.5000 mg | INHALATION_SOLUTION | Freq: Once | RESPIRATORY_TRACT | Status: AC
  Administered 2016-04-20: 2.5 mg via RESPIRATORY_TRACT

## 2016-04-20 NOTE — Progress Notes (Signed)
Mupirocin Ointment Rx called into [facility] in [place] for positive PCR of staph. Pt notified and voiced understanding.

## 2016-04-20 NOTE — Pre-Procedure Instructions (Signed)
    Jonathan Shepard  04/20/2016      [id number] - [place], [place] - [address] AT [address] [place] [address] Phone: [phone] Fax: [phone]  [id number] [place], [place] - [place] [id number] [place] [id number] Phone: [phone] Fax: [phone]  [facility] - [place], [place] [address] Phone: [phone] Fax: [phone]    Your procedure is scheduled on 04/22/16.  Report to [facility] Admitting at 9 A.M.  Call this number if you have problems the morning of surgery:  [phone]   Remember:  Do not eat food or drink liquids after midnight.  Take these medicines the morning of surgery with A SIP OF WATER --aspirin,hydrocodone,isosorbide,metoprolol,altace   Do not wear jewelry, make-up or nail polish.  Do not wear lotions, powders, or perfumes, or deoderant.  Do not shave 48 hours prior to surgery.  Men may shave face and neck.  Do not bring valuables to the hospital.  [facility] is not responsible for any belongings or valuables.  Contacts, dentures or bridgework may not be worn into surgery.  Leave your suitcase in the car.  After surgery it may be brought to your room.  For patients admitted to the hospital, discharge time will be determined by your treatment team.  Patients discharged the day of surgery will not be allowed to drive home.   Name and phone number of your driver:    Special instructions:    Please read over the following fact sheets that you were given. MRSA Information

## 2016-04-20 NOTE — Progress Notes (Signed)
Pre-op Cardiac Surgery  Carotid Findings:  1-39% ICA plaquing.  Vertebral artery flow is antegrade.   Upper Extremity Right Left  Brachial Pressures 146T 143T  Radial Waveforms T T  Ulnar Waveforms T T  Palmar Arch (Allen's Test) WNL WNL   Findings:  WNL    Lower  Extremity Right Left  Dorsalis Pedis     Anterior Tibial >300T >300T  Posterior Tibial >300T >300  Ankle/Brachial Indices Non compressible Non compressible    Findings:  Unable to ascertain ABIs secondary to non compressible vessels, however, waveforms are normal.

## 2016-04-21 LAB — HEMOGLOBIN A1C
Hgb A1c MFr Bld: 7.6 % — ABNORMAL HIGH (ref 4.8–5.6)
MEAN PLASMA GLUCOSE: 171 mg/dL

## 2016-04-21 MED ORDER — TRANEXAMIC ACID (OHS) BOLUS VIA INFUSION
15.0000 mg/kg | INTRAVENOUS | Status: AC
  Administered 2016-04-22: 1524 mg via INTRAVENOUS
  Filled 2016-04-21: qty 1524

## 2016-04-21 MED ORDER — DOPAMINE-DEXTROSE 3.2-5 MG/ML-% IV SOLN
0.0000 ug/kg/min | INTRAVENOUS | Status: DC
  Filled 2016-04-21: qty 250

## 2016-04-21 MED ORDER — PHENYLEPHRINE HCL 10 MG/ML IJ SOLN
30.0000 ug/min | INTRAVENOUS | Status: AC
  Administered 2016-04-22: 5 ug/min via INTRAVENOUS
  Filled 2016-04-21: qty 2

## 2016-04-21 MED ORDER — DEXTROSE 5 % IV SOLN
1.5000 g | INTRAVENOUS | Status: AC
  Administered 2016-04-22: 1.5 g via INTRAVENOUS
  Administered 2016-04-22: .75 g via INTRAVENOUS
  Filled 2016-04-21: qty 1.5

## 2016-04-21 MED ORDER — PLASMA-LYTE 148 IV SOLN
INTRAVENOUS | Status: AC
  Administered 2016-04-22: 20:00:00
  Filled 2016-04-21 (×2): qty 2.5

## 2016-04-21 MED ORDER — SODIUM CHLORIDE 0.9 % IV SOLN
INTRAVENOUS | Status: AC
  Administered 2016-04-22: .9 [IU]/h via INTRAVENOUS
  Filled 2016-04-21: qty 2.5

## 2016-04-21 MED ORDER — POTASSIUM CHLORIDE 2 MEQ/ML IV SOLN
80.0000 meq | INTRAVENOUS | Status: DC
  Filled 2016-04-21: qty 40

## 2016-04-21 MED ORDER — METOPROLOL TARTRATE 12.5 MG HALF TABLET: 12.5000 mg | ORAL_TABLET | Freq: Once | ORAL | Status: DC

## 2016-04-21 MED ORDER — MAGNESIUM SULFATE 50 % IJ SOLN
40.0000 meq | INTRAMUSCULAR | Status: DC
  Filled 2016-04-21: qty 10

## 2016-04-21 MED ORDER — NITROGLYCERIN IN D5W 200-5 MCG/ML-% IV SOLN
2.0000 ug/min | INTRAVENOUS | Status: DC
  Filled 2016-04-21: qty 250

## 2016-04-21 MED ORDER — TRANEXAMIC ACID (OHS) PUMP PRIME SOLUTION
2.0000 mg/kg | INTRAVENOUS | Status: DC
  Filled 2016-04-21: qty 2.03

## 2016-04-21 MED ORDER — DEXMEDETOMIDINE HCL IN NACL 400 MCG/100ML IV SOLN
0.1000 ug/kg/h | INTRAVENOUS | Status: AC
  Administered 2016-04-22: .3 ug/kg/h via INTRAVENOUS
  Filled 2016-04-21: qty 100

## 2016-04-21 MED ORDER — EPINEPHRINE PF 1 MG/ML IJ SOLN
0.0000 ug/min | INTRAVENOUS | Status: DC
  Filled 2016-04-21: qty 4

## 2016-04-21 MED ORDER — CEFUROXIME SODIUM 750 MG IJ SOLR
750.0000 mg | INTRAMUSCULAR | Status: DC
  Filled 2016-04-21: qty 750

## 2016-04-21 MED ORDER — TRANEXAMIC ACID 1000 MG/10ML IV SOLN
1.5000 mg/kg/h | INTRAVENOUS | Status: AC
  Administered 2016-04-22: 1.5 mg/kg/h via INTRAVENOUS
  Filled 2016-04-21: qty 25

## 2016-04-21 MED ORDER — SODIUM CHLORIDE 0.9 % IV SOLN
INTRAVENOUS | Status: DC
  Filled 2016-04-21: qty 30

## 2016-04-21 MED ORDER — VANCOMYCIN HCL 10 G IV SOLR
1500.0000 mg | INTRAVENOUS | Status: AC
  Administered 2016-04-22: 1500 mg via INTRAVENOUS
  Filled 2016-04-21 (×2): qty 1500

## 2016-04-22 ENCOUNTER — Encounter (HOSPITAL_COMMUNITY): Payer: Self-pay | Admitting: *Deleted

## 2016-04-22 ENCOUNTER — Ambulatory Visit (HOSPITAL_COMMUNITY)
Admission: RE | Admit: 2016-04-22 | Discharge: 2016-04-22 | Disposition: A | Discharge status: Home / Self Care | Payer: Commercial Managed Care - HMO | Source: Ambulatory Visit | Attending: Thoracic Surgery (Cardiothoracic Vascular Surgery) | Admitting: Thoracic Surgery (Cardiothoracic Vascular Surgery)

## 2016-04-22 ENCOUNTER — Inpatient Hospital Stay (HOSPITAL_COMMUNITY): Payer: Commercial Managed Care - HMO

## 2016-04-22 ENCOUNTER — Inpatient Hospital Stay (HOSPITAL_COMMUNITY)
Admission: RE | Admit: 2016-04-22 | Discharge: 2016-04-27 | DRG: 236 | Disposition: A | Discharge status: Home / Self Care | Payer: Commercial Managed Care - HMO | Source: Ambulatory Visit | Attending: Thoracic Surgery (Cardiothoracic Vascular Surgery) | Admitting: Thoracic Surgery (Cardiothoracic Vascular Surgery)

## 2016-04-22 ENCOUNTER — Ambulatory Visit (HOSPITAL_COMMUNITY): Payer: Commercial Managed Care - HMO

## 2016-04-22 ENCOUNTER — Inpatient Hospital Stay (HOSPITAL_COMMUNITY): Payer: Commercial Managed Care - HMO | Admitting: Certified Registered Nurse Anesthetist

## 2016-04-22 ENCOUNTER — Encounter (HOSPITAL_COMMUNITY): Payer: Self-pay

## 2016-04-22 ENCOUNTER — Encounter (HOSPITAL_COMMUNITY)
Admission: RE | Disposition: A | Discharge status: Home / Self Care | Payer: Self-pay | Source: Ambulatory Visit | Attending: Thoracic Surgery (Cardiothoracic Vascular Surgery)

## 2016-04-22 DIAGNOSIS — Z95818 Presence of other cardiac implants and grafts: Secondary | ICD-10-CM | POA: Diagnosis not present

## 2016-04-22 DIAGNOSIS — R200 Anesthesia of skin: Secondary | ICD-10-CM | POA: Diagnosis not present

## 2016-04-22 DIAGNOSIS — R000 Tachycardia, unspecified: Secondary | ICD-10-CM | POA: Diagnosis not present

## 2016-04-22 DIAGNOSIS — I2510 Atherosclerotic heart disease of native coronary artery without angina pectoris: Secondary | ICD-10-CM

## 2016-04-22 DIAGNOSIS — E860 Dehydration: Secondary | ICD-10-CM | POA: Diagnosis not present

## 2016-04-22 DIAGNOSIS — D62 Acute posthemorrhagic anemia: Secondary | ICD-10-CM | POA: Diagnosis not present

## 2016-04-22 DIAGNOSIS — Z833 Family history of diabetes mellitus: Secondary | ICD-10-CM | POA: Diagnosis not present

## 2016-04-22 DIAGNOSIS — I25119 Atherosclerotic heart disease of native coronary artery with unspecified angina pectoris: Secondary | ICD-10-CM | POA: Diagnosis present

## 2016-04-22 DIAGNOSIS — E782 Mixed hyperlipidemia: Secondary | ICD-10-CM | POA: Diagnosis present

## 2016-04-22 DIAGNOSIS — I25110 Atherosclerotic heart disease of native coronary artery with unstable angina pectoris: Secondary | ICD-10-CM | POA: Diagnosis not present

## 2016-04-22 DIAGNOSIS — E8770 Fluid overload, unspecified: Secondary | ICD-10-CM | POA: Diagnosis not present

## 2016-04-22 DIAGNOSIS — Z7984 Long term (current) use of oral hypoglycemic drugs: Secondary | ICD-10-CM | POA: Diagnosis not present

## 2016-04-22 DIAGNOSIS — Z01810 Encounter for preprocedural cardiovascular examination: Secondary | ICD-10-CM | POA: Diagnosis not present

## 2016-04-22 DIAGNOSIS — E1151 Type 2 diabetes mellitus with diabetic peripheral angiopathy without gangrene: Secondary | ICD-10-CM | POA: Diagnosis present

## 2016-04-22 DIAGNOSIS — Z951 Presence of aortocoronary bypass graft: Secondary | ICD-10-CM

## 2016-04-22 DIAGNOSIS — I2582 Chronic total occlusion of coronary artery: Secondary | ICD-10-CM | POA: Diagnosis present

## 2016-04-22 DIAGNOSIS — Z8249 Family history of ischemic heart disease and other diseases of the circulatory system: Secondary | ICD-10-CM

## 2016-04-22 DIAGNOSIS — I25118 Atherosclerotic heart disease of native coronary artery with other forms of angina pectoris: Secondary | ICD-10-CM | POA: Diagnosis not present

## 2016-04-22 DIAGNOSIS — E11649 Type 2 diabetes mellitus with hypoglycemia without coma: Secondary | ICD-10-CM | POA: Diagnosis not present

## 2016-04-22 DIAGNOSIS — I452 Bifascicular block: Secondary | ICD-10-CM | POA: Diagnosis not present

## 2016-04-22 DIAGNOSIS — J9811 Atelectasis: Secondary | ICD-10-CM | POA: Diagnosis not present

## 2016-04-22 DIAGNOSIS — E669 Obesity, unspecified: Secondary | ICD-10-CM | POA: Diagnosis present

## 2016-04-22 DIAGNOSIS — E1159 Type 2 diabetes mellitus with other circulatory complications: Secondary | ICD-10-CM | POA: Diagnosis not present

## 2016-04-22 DIAGNOSIS — E119 Type 2 diabetes mellitus without complications: Secondary | ICD-10-CM

## 2016-04-22 DIAGNOSIS — I10 Essential (primary) hypertension: Secondary | ICD-10-CM | POA: Diagnosis present

## 2016-04-22 DIAGNOSIS — Z7902 Long term (current) use of antithrombotics/antiplatelets: Secondary | ICD-10-CM

## 2016-04-22 DIAGNOSIS — I739 Peripheral vascular disease, unspecified: Secondary | ICD-10-CM | POA: Diagnosis present

## 2016-04-22 DIAGNOSIS — Z01818 Encounter for other preprocedural examination: Secondary | ICD-10-CM

## 2016-04-22 DIAGNOSIS — Z7982 Long term (current) use of aspirin: Secondary | ICD-10-CM | POA: Diagnosis not present

## 2016-04-22 DIAGNOSIS — Z6830 Body mass index (BMI) 30.0-30.9, adult: Secondary | ICD-10-CM

## 2016-04-22 DIAGNOSIS — K5900 Constipation, unspecified: Secondary | ICD-10-CM | POA: Diagnosis not present

## 2016-04-22 DIAGNOSIS — I25708 Atherosclerosis of coronary artery bypass graft(s), unspecified, with other forms of angina pectoris: Secondary | ICD-10-CM | POA: Diagnosis not present

## 2016-04-22 HISTORY — PX: CORONARY ARTERY BYPASS GRAFT: SHX141

## 2016-04-22 HISTORY — PX: TEE WITHOUT CARDIOVERSION: SHX5443

## 2016-04-22 HISTORY — PX: RADIAL ARTERY HARVEST: SHX5067

## 2016-04-22 LAB — GLUCOSE, CAPILLARY
GLUCOSE-CAPILLARY: 171 mg/dL — AB (ref 65–99)
GLUCOSE-CAPILLARY: 105 mg/dL — AB (ref 65–99)
Glucose-Capillary: 115 mg/dL — ABNORMAL HIGH (ref 65–99)

## 2016-04-22 LAB — ECHO INTRAOPERATIVE TEE
Height: 72 in
WEIGHTICAEL: 3568 [oz_av]

## 2016-04-22 LAB — HEMOGLOBIN AND HEMATOCRIT, BLOOD
HCT: 27.5 % — ABNORMAL LOW (ref 39.0–52.0)
HEMOGLOBIN: 9.3 g/dL — AB (ref 13.0–17.0)

## 2016-04-22 LAB — PLATELET COUNT: Platelets: 139 10*3/uL — ABNORMAL LOW (ref 150–400)

## 2016-04-22 SURGERY — CORONARY ARTERY BYPASS GRAFTING (CABG)
Anesthesia: General | Site: Chest

## 2016-04-22 MED ORDER — HEPARIN SODIUM (PORCINE) 1000 UNIT/ML IJ SOLN
INTRAMUSCULAR | Status: AC
  Filled 2016-04-22: qty 1

## 2016-04-22 MED ORDER — ACETAMINOPHEN 160 MG/5ML PO SOLN: 650.0000 mg | Freq: Once | ORAL | Status: AC

## 2016-04-22 MED ORDER — PANTOPRAZOLE SODIUM 40 MG PO TBEC
40.0000 mg | DELAYED_RELEASE_TABLET | Freq: Every day | ORAL | Status: DC
  Administered 2016-04-24 – 2016-04-27 (×4): 40 mg via ORAL
  Filled 2016-04-22 (×4): qty 1

## 2016-04-22 MED ORDER — PROTAMINE SULFATE 10 MG/ML IV SOLN
INTRAVENOUS | Status: AC
  Filled 2016-04-22: qty 5

## 2016-04-22 MED ORDER — ACETAMINOPHEN 650 MG RE SUPP
650.0000 mg | Freq: Once | RECTAL | Status: AC
  Administered 2016-04-23: 650 mg via RECTAL

## 2016-04-22 MED ORDER — MORPHINE SULFATE (PF) 2 MG/ML IV SOLN
2.0000 mg | INTRAVENOUS | Status: DC | PRN
  Administered 2016-04-23 (×2): 2 mg via INTRAVENOUS
  Administered 2016-04-24: 4 mg via INTRAVENOUS
  Filled 2016-04-22 (×2): qty 1
  Filled 2016-04-22: qty 2
  Filled 2016-04-22 (×2): qty 1
  Filled 2016-04-22 (×2): qty 2
  Filled 2016-04-22 (×2): qty 1

## 2016-04-22 MED ORDER — SODIUM CHLORIDE 0.9 % IV SOLN
INTRAVENOUS | Status: DC
  Administered 2016-04-23: 6.2 [IU]/h via INTRAVENOUS
  Administered 2016-04-24: 02:00:00 via INTRAVENOUS
  Filled 2016-04-22 (×2): qty 2.5

## 2016-04-22 MED ORDER — ROCURONIUM BROMIDE 10 MG/ML (PF) SYRINGE
PREFILLED_SYRINGE | INTRAVENOUS | Status: AC
  Filled 2016-04-22: qty 10

## 2016-04-22 MED ORDER — MIDAZOLAM HCL 2 MG/2ML IJ SOLN: 2.0000 mg | INTRAMUSCULAR | Status: DC | PRN

## 2016-04-22 MED ORDER — FENTANYL CITRATE (PF) 100 MCG/2ML IJ SOLN
INTRAMUSCULAR | Status: AC
  Administered 2016-04-22: 50 ug via INTRAVENOUS
  Filled 2016-04-22: qty 2

## 2016-04-22 MED ORDER — CHLORHEXIDINE GLUCONATE 4 % EX LIQD: 30.0000 mL | CUTANEOUS | Status: DC

## 2016-04-22 MED ORDER — FENTANYL CITRATE (PF) 250 MCG/5ML IJ SOLN
INTRAMUSCULAR | Status: DC | PRN
  Administered 2016-04-22: 100 ug via INTRAVENOUS
  Administered 2016-04-22: 250 ug via INTRAVENOUS
  Administered 2016-04-22: 100 ug via INTRAVENOUS
  Administered 2016-04-22: 250 ug via INTRAVENOUS
  Administered 2016-04-22: 200 ug via INTRAVENOUS
  Administered 2016-04-22: 100 ug via INTRAVENOUS
  Administered 2016-04-22: 50 ug via INTRAVENOUS
  Administered 2016-04-22: 150 ug via INTRAVENOUS

## 2016-04-22 MED ORDER — ARTIFICIAL TEARS OP OINT
TOPICAL_OINTMENT | OPHTHALMIC | Status: DC | PRN
  Administered 2016-04-22: 1 via OPHTHALMIC

## 2016-04-22 MED ORDER — ROCURONIUM BROMIDE 10 MG/ML (PF) SYRINGE
PREFILLED_SYRINGE | INTRAVENOUS | Status: DC | PRN
  Administered 2016-04-22 (×4): 50 mg via INTRAVENOUS

## 2016-04-22 MED ORDER — PROPOFOL 10 MG/ML IV BOLUS
INTRAVENOUS | Status: DC | PRN
  Administered 2016-04-22: 140 mg via INTRAVENOUS

## 2016-04-22 MED ORDER — INSULIN REGULAR BOLUS VIA INFUSION
0.0000 [IU] | Freq: Three times a day (TID) | INTRAVENOUS | Status: DC
  Filled 2016-04-22: qty 10

## 2016-04-22 MED ORDER — OXYCODONE HCL 5 MG PO TABS
5.0000 mg | ORAL_TABLET | ORAL | Status: DC | PRN
  Administered 2016-04-23 – 2016-04-25 (×9): 10 mg via ORAL
  Administered 2016-04-25: 5 mg via ORAL
  Administered 2016-04-26 – 2016-04-27 (×5): 10 mg via ORAL
  Filled 2016-04-22 (×15): qty 2

## 2016-04-22 MED ORDER — LACTATED RINGERS IV SOLN
INTRAVENOUS | Status: DC | PRN
  Administered 2016-04-22: 11:00:00 via INTRAVENOUS

## 2016-04-22 MED ORDER — DOCUSATE SODIUM 100 MG PO CAPS
200.0000 mg | ORAL_CAPSULE | Freq: Every day | ORAL | Status: DC
  Administered 2016-04-23 – 2016-04-27 (×5): 200 mg via ORAL
  Filled 2016-04-22 (×5): qty 2

## 2016-04-22 MED ORDER — ACETAMINOPHEN 500 MG PO TABS
1000.0000 mg | ORAL_TABLET | Freq: Four times a day (QID) | ORAL | Status: DC
  Administered 2016-04-23 – 2016-04-27 (×18): 1000 mg via ORAL
  Filled 2016-04-22 (×18): qty 2

## 2016-04-22 MED ORDER — PROTAMINE SULFATE 10 MG/ML IV SOLN
INTRAVENOUS | Status: AC
  Filled 2016-04-22: qty 25

## 2016-04-22 MED ORDER — SODIUM CHLORIDE 0.9 % IV SOLN: INTRAVENOUS | Status: DC

## 2016-04-22 MED ORDER — POTASSIUM CHLORIDE 10 MEQ/50ML IV SOLN: 10.0000 meq | INTRAVENOUS | Status: AC

## 2016-04-22 MED ORDER — VANCOMYCIN HCL IN DEXTROSE 1-5 GM/200ML-% IV SOLN
1000.0000 mg | Freq: Once | INTRAVENOUS | Status: AC
  Administered 2016-04-23: 1000 mg via INTRAVENOUS
  Filled 2016-04-22 (×2): qty 200

## 2016-04-22 MED ORDER — ALBUMIN HUMAN 5 % IV SOLN
250.0000 mL | INTRAVENOUS | Status: AC | PRN
  Administered 2016-04-23 (×3): 250 mL via INTRAVENOUS
  Filled 2016-04-22: qty 250

## 2016-04-22 MED ORDER — CHLORHEXIDINE GLUCONATE 0.12 % MT SOLN
15.0000 mL | Freq: Once | OROMUCOSAL | Status: AC
  Administered 2016-04-22 – 2016-04-23 (×2): 15 mL via OROMUCOSAL
  Filled 2016-04-22: qty 15

## 2016-04-22 MED ORDER — ASPIRIN EC 325 MG PO TBEC
325.0000 mg | DELAYED_RELEASE_TABLET | Freq: Every day | ORAL | Status: DC
  Administered 2016-04-23: 325 mg via ORAL
  Filled 2016-04-22: qty 1

## 2016-04-22 MED ORDER — SODIUM CHLORIDE 0.9% FLUSH
3.0000 mL | INTRAVENOUS | Status: DC | PRN
Start: 2016-04-23 — End: 2016-04-25

## 2016-04-22 MED ORDER — PROTAMINE SULFATE 10 MG/ML IV SOLN
INTRAVENOUS | Status: DC | PRN
  Administered 2016-04-22: 80 mg via INTRAVENOUS
  Administered 2016-04-22: 20 mg via INTRAVENOUS
  Administered 2016-04-22 (×4): 50 mg via INTRAVENOUS

## 2016-04-22 MED ORDER — LACTATED RINGERS IV SOLN
INTRAVENOUS | Status: DC | PRN
  Administered 2016-04-22 (×2): via INTRAVENOUS

## 2016-04-22 MED ORDER — ALBUMIN HUMAN 5 % IV SOLN
INTRAVENOUS | Status: DC | PRN
  Administered 2016-04-22: 23:00:00 via INTRAVENOUS

## 2016-04-22 MED ORDER — LACTATED RINGERS IV SOLN: 500.0000 mL | Freq: Once | INTRAVENOUS | Status: DC | PRN

## 2016-04-22 MED ORDER — SODIUM CHLORIDE 0.9 % IJ SOLN
INTRAMUSCULAR | Status: AC
  Filled 2016-04-22: qty 10

## 2016-04-22 MED ORDER — DEXTROSE 5 % IV SOLN
1.5000 g | Freq: Two times a day (BID) | INTRAVENOUS | Status: AC
  Administered 2016-04-23 – 2016-04-24 (×4): 1.5 g via INTRAVENOUS
  Filled 2016-04-22 (×4): qty 1.5

## 2016-04-22 MED ORDER — FAMOTIDINE IN NACL 20-0.9 MG/50ML-% IV SOLN
20.0000 mg | Freq: Two times a day (BID) | INTRAVENOUS | Status: AC
  Administered 2016-04-23 (×2): 20 mg via INTRAVENOUS
  Filled 2016-04-22: qty 50

## 2016-04-22 MED ORDER — LACTATED RINGERS IV SOLN: INTRAVENOUS | Status: DC

## 2016-04-22 MED ORDER — FENTANYL CITRATE (PF) 250 MCG/5ML IJ SOLN
INTRAMUSCULAR | Status: AC
  Filled 2016-04-22: qty 30

## 2016-04-22 MED ORDER — MIDAZOLAM HCL 10 MG/2ML IJ SOLN
INTRAMUSCULAR | Status: AC
  Filled 2016-04-22: qty 2

## 2016-04-22 MED ORDER — LIDOCAINE 2% (20 MG/ML) 5 ML SYRINGE
INTRAMUSCULAR | Status: AC
  Filled 2016-04-22: qty 5

## 2016-04-22 MED ORDER — BISACODYL 10 MG RE SUPP
10.0000 mg | Freq: Every day | RECTAL | Status: DC
  Administered 2016-04-23: 10 mg via RECTAL
  Filled 2016-04-22: qty 1

## 2016-04-22 MED ORDER — PHENYLEPHRINE 40 MCG/ML (10ML) SYRINGE FOR IV PUSH (FOR BLOOD PRESSURE SUPPORT)
PREFILLED_SYRINGE | INTRAVENOUS | Status: AC
  Filled 2016-04-22: qty 20

## 2016-04-22 MED ORDER — SODIUM CHLORIDE 0.45 % IV SOLN: INTRAVENOUS | Status: DC | PRN

## 2016-04-22 MED ORDER — SODIUM CHLORIDE 0.9 % IV SOLN: 250.0000 mL | INTRAVENOUS | Status: DC

## 2016-04-22 MED ORDER — TRAMADOL HCL 50 MG PO TABS
50.0000 mg | ORAL_TABLET | ORAL | Status: DC | PRN
  Administered 2016-04-23 – 2016-04-27 (×7): 100 mg via ORAL
  Filled 2016-04-22 (×7): qty 2

## 2016-04-22 MED ORDER — PROPOFOL 10 MG/ML IV BOLUS
INTRAVENOUS | Status: AC
  Filled 2016-04-22: qty 20

## 2016-04-22 MED ORDER — MIDAZOLAM HCL 5 MG/5ML IJ SOLN
INTRAMUSCULAR | Status: DC | PRN
  Administered 2016-04-22 (×2): 2 mg via INTRAVENOUS
  Administered 2016-04-22: 4 mg via INTRAVENOUS
  Administered 2016-04-22: 2 mg via INTRAVENOUS

## 2016-04-22 MED ORDER — METOPROLOL TARTRATE 12.5 MG HALF TABLET
12.5000 mg | ORAL_TABLET | Freq: Two times a day (BID) | ORAL | Status: DC
  Administered 2016-04-23 (×2): 12.5 mg via ORAL
  Filled 2016-04-22 (×2): qty 1

## 2016-04-22 MED ORDER — FENTANYL CITRATE (PF) 250 MCG/5ML IJ SOLN
INTRAMUSCULAR | Status: AC
  Filled 2016-04-22: qty 5

## 2016-04-22 MED ORDER — 0.9 % SODIUM CHLORIDE (POUR BTL) OPTIME
TOPICAL | Status: DC | PRN
  Administered 2016-04-22: 6000 mL

## 2016-04-22 MED ORDER — STERILE WATER FOR IRRIGATION IR SOLN
Status: DC | PRN
  Administered 2016-04-22: 2000 mL

## 2016-04-22 MED ORDER — FENTANYL CITRATE (PF) 100 MCG/2ML IJ SOLN
50.0000 ug | Freq: Once | INTRAMUSCULAR | Status: AC
  Administered 2016-04-22 (×2): 50 ug via INTRAVENOUS

## 2016-04-22 MED ORDER — MAGNESIUM SULFATE 4 GM/100ML IV SOLN
4.0000 g | Freq: Once | INTRAVENOUS | Status: AC
  Administered 2016-04-23: 4 g via INTRAVENOUS
  Filled 2016-04-22: qty 100

## 2016-04-22 MED ORDER — LACTATED RINGERS IV SOLN
INTRAVENOUS | Status: DC
  Administered 2016-04-22 (×2): via INTRAVENOUS

## 2016-04-22 MED ORDER — ASPIRIN 81 MG PO CHEW: 324.0000 mg | CHEWABLE_TABLET | Freq: Every day | ORAL | Status: DC

## 2016-04-22 MED ORDER — METOPROLOL TARTRATE 5 MG/5ML IV SOLN: 2.5000 mg | INTRAVENOUS | Status: DC | PRN

## 2016-04-22 MED ORDER — ROCURONIUM BROMIDE 10 MG/ML (PF) SYRINGE
PREFILLED_SYRINGE | INTRAVENOUS | Status: AC
  Filled 2016-04-22: qty 40

## 2016-04-22 MED ORDER — ACETAMINOPHEN 160 MG/5ML PO SOLN: 1000.0000 mg | Freq: Four times a day (QID) | ORAL | Status: DC

## 2016-04-22 MED ORDER — PHENYLEPHRINE HCL 10 MG/ML IJ SOLN
INTRAVENOUS | Status: DC | PRN
  Administered 2016-04-22: 20 ug/min via INTRAVENOUS

## 2016-04-22 MED ORDER — PHENYLEPHRINE HCL 10 MG/ML IJ SOLN
0.0000 ug/min | INTRAVENOUS | Status: DC
  Administered 2016-04-23: 5 ug/min via INTRAVENOUS
  Filled 2016-04-22: qty 2

## 2016-04-22 MED ORDER — DEXMEDETOMIDINE HCL IN NACL 200 MCG/50ML IV SOLN
0.0000 ug/kg/h | INTRAVENOUS | Status: DC
Start: 2016-04-22 — End: 2016-04-25
  Administered 2016-04-23: 0.4 ug/kg/h via INTRAVENOUS
  Filled 2016-04-22: qty 50

## 2016-04-22 MED ORDER — METOPROLOL TARTRATE 25 MG/10 ML ORAL SUSPENSION
12.5000 mg | Freq: Two times a day (BID) | ORAL | Status: DC
Start: 2016-04-23 — End: 2016-04-24

## 2016-04-22 MED ORDER — CHLORHEXIDINE GLUCONATE 0.12 % MT SOLN
15.0000 mL | OROMUCOSAL | Status: AC
  Administered 2016-04-22: 15 mL via OROMUCOSAL

## 2016-04-22 MED ORDER — HEMOSTATIC AGENTS (NO CHARGE) OPTIME
TOPICAL | Status: DC | PRN
  Administered 2016-04-22 (×2): 1 via TOPICAL

## 2016-04-22 MED ORDER — HEPARIN SODIUM (PORCINE) 1000 UNIT/ML IJ SOLN
INTRAMUSCULAR | Status: DC | PRN
  Administered 2016-04-22: 2000 [IU] via INTRAVENOUS
  Administered 2016-04-22: 28000 [IU] via INTRAVENOUS

## 2016-04-22 MED ORDER — SODIUM CHLORIDE 0.9% FLUSH
3.0000 mL | Freq: Two times a day (BID) | INTRAVENOUS | Status: DC
  Administered 2016-04-23 – 2016-04-25 (×2): 3 mL via INTRAVENOUS

## 2016-04-22 MED ORDER — MORPHINE SULFATE (PF) 2 MG/ML IV SOLN
1.0000 mg | INTRAVENOUS | Status: AC | PRN
  Administered 2016-04-23: 4 mg via INTRAVENOUS
  Administered 2016-04-23 (×3): 2 mg via INTRAVENOUS
  Administered 2016-04-23: 4 mg via INTRAVENOUS
  Administered 2016-04-23 (×2): 2 mg via INTRAVENOUS
  Filled 2016-04-22: qty 1

## 2016-04-22 MED ORDER — BISACODYL 5 MG PO TBEC
10.0000 mg | DELAYED_RELEASE_TABLET | Freq: Every day | ORAL | Status: DC
  Administered 2016-04-24 – 2016-04-26 (×3): 10 mg via ORAL
  Filled 2016-04-22 (×4): qty 2

## 2016-04-22 MED ORDER — PRAVASTATIN SODIUM 40 MG PO TABS
80.0000 mg | ORAL_TABLET | Freq: Every day | ORAL | Status: DC
  Administered 2016-04-23 – 2016-04-26 (×4): 80 mg via ORAL
  Filled 2016-04-22: qty 2
  Filled 2016-04-22 (×3): qty 1

## 2016-04-22 MED ORDER — ONDANSETRON HCL 4 MG/2ML IJ SOLN: 4.0000 mg | Freq: Four times a day (QID) | INTRAMUSCULAR | Status: DC | PRN

## 2016-04-22 MED FILL — Heparin Sodium (Porcine) Inj 1000 Unit/ML: INTRAMUSCULAR | Qty: 30 | Status: AC

## 2016-04-22 MED FILL — Potassium Chloride Inj 2 mEq/ML: INTRAVENOUS | Qty: 40 | Status: AC

## 2016-04-22 MED FILL — Magnesium Sulfate Inj 50%: INTRAMUSCULAR | Qty: 10 | Status: AC

## 2016-04-22 SURGICAL SUPPLY — 101 items
APPLIER CLIP 9.375 SM OPEN (CLIP)
BAG DECANTER FOR FLEXI CONT (MISCELLANEOUS) ×10 IMPLANT
BANDAGE ACE 4X5 VEL STRL LF (GAUZE/BANDAGES/DRESSINGS) IMPLANT
BANDAGE ACE 6X5 VEL STRL LF (GAUZE/BANDAGES/DRESSINGS) IMPLANT
BANDAGE ELASTIC 4 VELCRO ST LF (GAUZE/BANDAGES/DRESSINGS) ×10 IMPLANT
BANDAGE ELASTIC 6 VELCRO ST LF (GAUZE/BANDAGES/DRESSINGS) ×5 IMPLANT
BASKET HEART  (ORDER IN 25'S) (MISCELLANEOUS) ×1
BASKET HEART (ORDER IN 25'S) (MISCELLANEOUS) ×1
BASKET HEART (ORDER IN 25S) (MISCELLANEOUS) ×3 IMPLANT
BLADE STERNUM SYSTEM 6 (BLADE) ×5 IMPLANT
BLADE SURG 15 STRL LF DISP TIS (BLADE) IMPLANT
BLADE SURG 15 STRL SS (BLADE)
BNDG GAUZE ELAST 4 BULKY (GAUZE/BANDAGES/DRESSINGS) ×10 IMPLANT
CANISTER SUCTION 2500CC (MISCELLANEOUS) ×5 IMPLANT
CANNULA EZ GLIDE AORTIC 21FR (CANNULA) ×5 IMPLANT
CATH CPB KIT HENDRICKSON (MISCELLANEOUS) ×5 IMPLANT
CATH ROBINSON RED A/P 18FR (CATHETERS) ×5 IMPLANT
CATH THORACIC 36FR (CATHETERS) ×5 IMPLANT
CATH THORACIC 36FR RT ANG (CATHETERS) ×5 IMPLANT
CLIP APPLIE 9.375 SM OPEN (CLIP) IMPLANT
CLIP FOGARTY SPRING 6M (CLIP) ×5 IMPLANT
CLIP TI MEDIUM 24 (CLIP) ×5 IMPLANT
CLIP TI WIDE RED SMALL 24 (CLIP) ×10 IMPLANT
COVER MAYO STAND STRL (DRAPES) ×5 IMPLANT
CRADLE DONUT ADULT HEAD (MISCELLANEOUS) ×5 IMPLANT
DRAPE CARDIOVASCULAR INCISE (DRAPES) ×2
DRAPE EXTREMITY TIBURON (DRAPES) ×5 IMPLANT
DRAPE HALF SHEET 40X57 (DRAPES) IMPLANT
DRAPE PROXIMA HALF (DRAPES) ×5 IMPLANT
DRAPE SLUSH/WARMER DISC (DRAPES) ×5 IMPLANT
DRAPE SRG 135X102X78XABS (DRAPES) ×3 IMPLANT
DRSG COVADERM 4X14 (GAUZE/BANDAGES/DRESSINGS) ×5 IMPLANT
ELECT REM PT RETURN 9FT ADLT (ELECTROSURGICAL) ×10
ELECTRODE REM PT RTRN 9FT ADLT (ELECTROSURGICAL) ×6 IMPLANT
FELT TEFLON 1X6 (MISCELLANEOUS) ×10 IMPLANT
GAUZE SPONGE 4X4 12PLY STRL (GAUZE/BANDAGES/DRESSINGS) IMPLANT
GEL ULTRASOUND 20GR AQUASONIC (MISCELLANEOUS) ×5 IMPLANT
GLOVE BIO SURGEON STRL SZ 6.5 (GLOVE) ×40 IMPLANT
GLOVE BIO SURGEONS STRL SZ 6.5 (GLOVE) ×10
GLOVE BIOGEL PI IND STRL 7.0 (GLOVE) ×30 IMPLANT
GLOVE BIOGEL PI INDICATOR 7.0 (GLOVE) ×20
GLOVE SURG SIGNA 7.5 PF LTX (GLOVE) ×15 IMPLANT
GLOVE SURG SS PI 6.0 STRL IVOR (GLOVE) ×10 IMPLANT
GOWN STRL REUS W/ TWL LRG LVL3 (GOWN DISPOSABLE) ×30 IMPLANT
GOWN STRL REUS W/ TWL XL LVL3 (GOWN DISPOSABLE) ×9 IMPLANT
GOWN STRL REUS W/TWL LRG LVL3 (GOWN DISPOSABLE) ×20
GOWN STRL REUS W/TWL XL LVL3 (GOWN DISPOSABLE) ×6
HARMONIC SHEARS 14CM COAG (MISCELLANEOUS) IMPLANT
HEMOSTAT POWDER SURGIFOAM 1G (HEMOSTASIS) ×15 IMPLANT
HEMOSTAT SURGICEL 2X14 (HEMOSTASIS) IMPLANT
INSERT FOGARTY XLG (MISCELLANEOUS) IMPLANT
KIT BASIN OR (CUSTOM PROCEDURE TRAY) ×5 IMPLANT
KIT ROOM TURNOVER OR (KITS) ×5 IMPLANT
KIT SUCTION CATH 14FR (SUCTIONS) ×10 IMPLANT
KIT VASOVIEW HEMOPRO VH 3000 (KITS) ×5 IMPLANT
MARKER GRAFT CORONARY BYPASS (MISCELLANEOUS) ×15 IMPLANT
NS IRRIG 1000ML POUR BTL (IV SOLUTION) ×30 IMPLANT
PACK OPEN HEART (CUSTOM PROCEDURE TRAY) ×5 IMPLANT
PAD ARMBOARD 7.5X6 YLW CONV (MISCELLANEOUS) ×15 IMPLANT
PAD ELECT DEFIB RADIOL ZOLL (MISCELLANEOUS) ×5 IMPLANT
PENCIL BUTTON HOLSTER BLD 10FT (ELECTRODE) ×10 IMPLANT
PUNCH AORTIC ROTATE  4.5MM 8IN (MISCELLANEOUS) ×5 IMPLANT
PUNCH AORTIC ROTATE 4.0MM (MISCELLANEOUS) IMPLANT
PUNCH AORTIC ROTATE 4.5MM 8IN (MISCELLANEOUS) IMPLANT
PUNCH AORTIC ROTATE 5MM 8IN (MISCELLANEOUS) IMPLANT
SET CARDIOPLEGIA MPS 5001102 (MISCELLANEOUS) ×5 IMPLANT
SHEARS HARMONIC 9CM CVD (BLADE) ×5 IMPLANT
SPONGE GAUZE 4X4 12PLY STER LF (GAUZE/BANDAGES/DRESSINGS) ×15 IMPLANT
SPONGE LAP 4X18 X RAY DECT (DISPOSABLE) ×5 IMPLANT
SUT BONE WAX W31G (SUTURE) ×5 IMPLANT
SUT MNCRL AB 4-0 PS2 18 (SUTURE) ×5 IMPLANT
SUT PROLENE 3 0 SH DA (SUTURE) ×5 IMPLANT
SUT PROLENE 4 0 RB 1 (SUTURE)
SUT PROLENE 4 0 SH DA (SUTURE) IMPLANT
SUT PROLENE 4-0 RB1 .5 CRCL 36 (SUTURE) IMPLANT
SUT PROLENE 6 0 C 1 30 (SUTURE) ×15 IMPLANT
SUT PROLENE 7 0 BV1 MDA (SUTURE) ×5 IMPLANT
SUT PROLENE 8 0 BV175 6 (SUTURE) ×5 IMPLANT
SUT STEEL 6MS V (SUTURE) ×10 IMPLANT
SUT STEEL STERNAL CCS#1 18IN (SUTURE) IMPLANT
SUT STEEL SZ 6 DBL 3X14 BALL (SUTURE) ×5 IMPLANT
SUT VIC AB 1 CTX 36 (SUTURE) ×4
SUT VIC AB 1 CTX36XBRD ANBCTR (SUTURE) ×6 IMPLANT
SUT VIC AB 2-0 CT1 27 (SUTURE) ×4
SUT VIC AB 2-0 CT1 TAPERPNT 27 (SUTURE) ×6 IMPLANT
SUT VIC AB 2-0 CTX 27 (SUTURE) IMPLANT
SUT VIC AB 3-0 SH 27 (SUTURE)
SUT VIC AB 3-0 SH 27X BRD (SUTURE) IMPLANT
SUT VIC AB 3-0 X1 27 (SUTURE) ×5 IMPLANT
SUT VICRYL 4-0 PS2 18IN ABS (SUTURE) IMPLANT
SUTURE E-PAK OPEN HEART (SUTURE) ×5 IMPLANT
SYR 50ML SLIP (SYRINGE) ×5 IMPLANT
SYSTEM SAHARA CHEST DRAIN ATS (WOUND CARE) ×5 IMPLANT
TAPE CLOTH SURG 4X10 WHT LF (GAUZE/BANDAGES/DRESSINGS) ×10 IMPLANT
TOWEL OR 17X24 6PK STRL BLUE (TOWEL DISPOSABLE) IMPLANT
TOWEL OR 17X26 10 PK STRL BLUE (TOWEL DISPOSABLE) ×20 IMPLANT
TRAY FOLEY IC TEMP SENS 16FR (CATHETERS) ×5 IMPLANT
TUBE FEEDING 8FR 16IN STR KANG (MISCELLANEOUS) ×5 IMPLANT
TUBING INSUFFLATION (TUBING) ×5 IMPLANT
UNDERPAD 30X30 (UNDERPADS AND DIAPERS) ×5 IMPLANT
WATER STERILE IRR 1000ML POUR (IV SOLUTION) ×10 IMPLANT

## 2016-04-22 NOTE — Anesthesia Procedure Notes (Signed)
Central Venous Catheter Insertion Performed by: anesthesiologist 04/22/2016 10:34 AM Patient location: Pre-op. Preanesthetic checklist: patient identified, IV checked, site marked, risks and benefits discussed, surgical consent, monitors and equipment checked, pre-op evaluation, timeout performed and anesthesia consent Position: Trendelenburg Lidocaine 1% used for infiltration Landmarks identified and Seldinger technique used Catheter size: 9 Fr PA cath was placed.MAC introducer Swan type and PA catheter depth:thermodilation and 50PA Cath depth:50 Procedure performed using ultrasound guided technique. Attempts: 1 Following insertion, line sutured and dressing applied. Post procedure assessment: free fluid flow, blood return through all ports and no air. Patient tolerated the procedure well with no immediate complications.

## 2016-04-22 NOTE — Anesthesia Preprocedure Evaluation (Signed)
Anesthesia Evaluation  Patient identified by MRN, date of birth, ID band Patient awake    Reviewed: Allergy & Precautions, NPO status , Patient's Chart, lab work & pertinent test results  History of Anesthesia Complications Negative for: history of anesthetic complications  Airway Mallampati: II  TM Distance: >3 FB Neck ROM: Full    Dental no notable dental hx. (+) Dental Advisory Given   Pulmonary neg pulmonary ROS,    Pulmonary exam normal        Cardiovascular hypertension, + CAD and + Cardiac Stents  Normal cardiovascular exam   There is mild left ventricular systolic dysfunction.  LV end diastolic pressure is normal.  The left ventricular ejection fraction is 45-50% by visual estimate.  Prox RCA to Mid RCA lesion, 100 %stenosed.  Ost RCA to Prox RCA lesion, 90 %stenosed.  Ost LAD to Prox LAD lesion, 0 %stenosed.  Prox LAD to Mid LAD lesion, 75 %stenosed.  Prox Cx to Mid Cx lesion, 90 %stenosed.   Neuro/Psych negative neurological ROS  negative psych ROS   GI/Hepatic negative GI ROS, Neg liver ROS,   Endo/Other  diabetes  Renal/GU negative Renal ROS     Musculoskeletal   Abdominal   Peds  Hematology   Anesthesia Other Findings   Reproductive/Obstetrics                             Anesthesia Physical Anesthesia Plan  ASA: IV  Anesthesia Plan: General   Post-op Pain Management:    Induction: Intravenous  Airway Management Planned: Oral ETT  Additional Equipment: Arterial line, PA Cath, Ultrasound Guidance Line Placement and 3D TEE  Intra-op Plan:   Post-operative Plan: Possible Post-op intubation/ventilation  Informed Consent: I have reviewed the patients History and Physical, chart, labs and discussed the procedure including the risks, benefits and alternatives for the proposed anesthesia with the patient or authorized representative who has indicated his/her  understanding and acceptance.   Dental advisory given  Plan Discussed with: CRNA, Anesthesiologist and Surgeon  Anesthesia Plan Comments:         Anesthesia Quick Evaluation

## 2016-04-22 NOTE — Progress Notes (Signed)
Pt just came from the OR. Pt placed on protocol ventilator settings. SIMV/PRVC/PS ventilation. Settings are in the flowsheet. Per CRNA patient was not a difficult intubation, one attempt 8.5 ETT placement at 23lip. Patient is stable at this time no distress or complications noted.

## 2016-04-22 NOTE — Transfer of Care (Signed)
Immediate Anesthesia Transfer of Care Note  Patient: Jonathan Shepard  Procedure(s) Performed: Procedure(s): CORONARY ARTERY BYPASS GRAFTING (CABG)x4 with endoscopic harvesting of right saphenous vein SVG to diagonal SVG to PDA Left Radial artery to OM 2 LIMA to LAD (N/A) TRANSESOPHAGEAL ECHOCARDIOGRAM (TEE) (N/A) LEFT RADIAL ARTERY HARVEST (Left)  Patient Location: SICU  Anesthesia Type:General  Level of Consciousness: Patient remains intubated per anesthesia plan  Airway & Oxygen Therapy: Patient remains intubated per anesthesia plan and Patient placed on Ventilator (see vital sign flow sheet for setting)  Post-op Assessment: Report given to RN and Post -op Vital signs reviewed and stable  Post vital signs: Reviewed and stable  Last Vitals:  Vitals:   04/22/16 1005 04/22/16 2347  BP: 130/64   Pulse: 62 91  Resp: 16 12  Temp: 36.7 C     Last Pain:  Vitals:   04/22/16 1005  TempSrc: Oral  PainSc:       Patients Stated Pain Goal: 3 ([id number])  Complications: No apparent anesthesia complications

## 2016-04-22 NOTE — Brief Op Note (Addendum)
04/22/2016  10:13 PM  PATIENT:  Jonathan Shepard  65 y.o. male  PRE-OPERATIVE DIAGNOSIS:  CAD  POST-OPERATIVE DIAGNOSIS:  CAD  PROCEDURE:  Procedure(s): CORONARY ARTERY BYPASS GRAFTING (CABG)x with endoscopic harvesting of right saphenous vein (N/A) TRANSESOPHAGEAL ECHOCARDIOGRAM (TEE) (N/A) LEFT RADIAL ARTERY HARVEST (Left)   SVG to diagonal SVG to PDA Left Radial artery to OM 2 LIMA to LAD  SURGEON:  Surgeon(s) and Role:    * Melrose Nakayama, MD - Primary  PHYSICIAN ASSISTANT:  Nicholes Rough, PA-C  ANESTHESIA:   general  EBL:  Total I/O In: -  Out: 135 [Urine:135]  BLOOD ADMINISTERED:none  DRAINS: routine   LOCAL MEDICATIONS USED:  NONE  SPECIMEN:  No Specimen  DISPOSITION OF SPECIMEN:  N/A  COUNTS:  YES  PLAN OF CARE: Admit to inpatient   PATIENT DISPOSITION:  ICU - intubated and hemodynamically stable.   Delay start of Pharmacological VTE agent (>24hrs) due to surgical blood loss or risk of bleeding: yes  Good conduits. Small coronaries Good LV function by TEE  Dictation # [id number]

## 2016-04-22 NOTE — Interval H&P Note (Signed)
History and Physical Interval Note:  04/22/2016 4:32 PM  Jonathan Shepard  has presented today for surgery, with the diagnosis of CAD  The various methods of treatment have been discussed with the patient and family. After consideration of risks, benefits and other options for treatment, the patient has consented to  Procedure(s): CORONARY ARTERY BYPASS GRAFTING (CABG) (N/A) TRANSESOPHAGEAL ECHOCARDIOGRAM (TEE) (N/A) RADIAL ARTERY HARVEST (Left) as a surgical intervention .  The patient's history has been reviewed, patient examined, no change in status, stable for surgery.  I have reviewed the patient's chart and labs.  Questions were answered to the patient's satisfaction.     Melrose Nakayama

## 2016-04-22 NOTE — OR Nursing (Signed)
2336 Rolling call made to SICU.

## 2016-04-22 NOTE — H&P (View-Only) (Signed)
PCP is [name]., MD Referring Provider is Martinique, Peter M, MD  Chief Complaint  Patient presents with  . Coronary Artery Disease    Surgical eval, Cardiac Cath 04/14/16    HPI: [name] is sent for consultation regarding three-vessel coronary disease.  [name] is a 65 year old man with a past medical history significant for mixed hyperlipidemia, hypertension, and type 2 diabetes. He has a strong family history of coronary disease with both his father and brother having had CABG. He has a history of coronary disease dating back to 2002 when he had stents placed. He had additional stenting done in 2008.  He recently saw [name] with a complaint of throat discomfort with exertion. He recommended cardiac catheterization which was done yesterday by Dr. Martinique. It showed severe three-vessel disease with a 70% LAD stenosis, 90% stenosis of the left circumflex, and a totally occluded right coronary. Ejection fraction was approximately 50%.  He tells me that he has discomfort in his upper chest with exertion. If he continues to exert himself but radiates to both arms. He has been stopping his activity when he first feels the discomfort so he has not had any arm pain recently. He did have upper chest discomfort walking into the office building this morning. He says this is not a pressure, tightness or pain. He sometimes experiences it after eating. He had been on medication for reflux but he stopped that and didn't notice any difference so he no longer takes that medicine.  Past Medical History:  Diagnosis Date  . CAD (coronary artery disease)   . Diabetes ([place])   . Hyperlipidemia   . Hypertension   . Obesity     Past Surgical History:  Procedure Laterality Date  . CARDIAC CATHETERIZATION N/A 04/14/2016   Procedure: Left Heart Cath and Coronary Angiography;  Surgeon: Peter M Martinique, MD;  Location: [facility];  Service: Cardiovascular;  Laterality: N/A;    Family  History  Problem Relation Age of Onset  . Coronary artery disease Father 41    CABG  . Coronary artery disease Brother 17    CABG    Social History Social History  Substance Use Topics  . Smoking status: Never Smoker  . Smokeless tobacco: Not on file  . Alcohol use Not on file    Current Outpatient Prescriptions  Medication Sig Dispense Refill  . aspirin EC 81 MG tablet Take 81 mg by mouth at bedtime.     . Choline Fenofibrate (FENOFIBRIC ACID) 45 MG CPDR TAKE 1 CAPSULE ONE TIME DAILY (Patient taking differently: take 45mg s at bedtime) 90 capsule 2  . clopidogrel (PLAVIX) 75 MG tablet TAKE 1 TABLET DAILY (NEEDS APPOINTMENT FOR REFILLS.) 90 tablet 2  . Coenzyme Q10 (CO Q10 PO) Take 30 mg by mouth daily.     . diclofenac (VOLTAREN) 75 MG EC tablet Take 1 tablet by mouth 2 (two) times daily.    Marland Kitchen glimepiride (AMARYL) 4 MG tablet Take 4 mg by mouth 2 (two) times daily.     Marland Kitchen HYDROcodone-acetaminophen (NORCO) 10-325 MG tablet Take 1 tablet by mouth every 8 (eight) hours as needed for moderate pain.    Marland Kitchen ibuprofen (ADVIL,MOTRIN) 200 MG tablet Take 200-400 mg by mouth every 6 (six) hours as needed for headache.    . isosorbide dinitrate (ISORDIL) 10 MG tablet Take 10 mg by mouth every morning.  1  . metFORMIN (GLUCOPHAGE) 1000 MG tablet Take 1,000 mg by mouth 2 (two) times daily  with a meal.    . metoprolol succinate (TOPROL-XL) 25 MG 24 hr tablet TAKE 2 TABLETS EVERY DAY (Patient taking differently: take 25mg s twice daily) 180 tablet 2  . Multiple Vitamin (MULTIVITAMIN) tablet Take 1 tablet by mouth daily.    . nitroGLYCERIN (NITROSTAT) 0.4 MG SL tablet Place 1 tablet (0.4 mg total) under the tongue every 5 (five) minutes as needed for chest pain. 25 tablet 3  . Omega-3 Fatty Acids (FISH OIL) 1200 MG CAPS Take 1 capsule by mouth 2 (two) times daily.     . pravastatin (PRAVACHOL) 80 MG tablet TAKE 1 TABLET EVERY DAY (Patient taking differently: take 80mg s at bedtime) 90 tablet 2  . ramipril  (ALTACE) 2.5 MG capsule TAKE 1 CAPSULE EVERY DAY 90 capsule 2   No current facility-administered medications for this visit.     No Known Allergies  Review of Systems  Constitutional: Positive for activity change and fatigue. Negative for unexpected weight change.  HENT: Negative for trouble swallowing.   Eyes: Negative for visual disturbance.  Respiratory: Positive for shortness of breath. Negative for cough and wheezing.   Cardiovascular: Positive for chest pain. Negative for leg swelling.       Denies claudication  Gastrointestinal: Negative for abdominal pain and blood in stool.  Endocrine: Negative for polydipsia and polyphagia.  Genitourinary: Negative for difficulty urinating and dysuria.  Musculoskeletal: Positive for arthralgias (Psoriatic arthritis).       Leg cramps  Neurological: Positive for headaches. Negative for dizziness and syncope.  Hematological: Negative for adenopathy. Does not bruise/bleed easily.  All other systems reviewed and are negative.   BP (!) 141/84   Pulse 65   Resp 16   Ht 6' (1.829 m)   Wt 225 lb (102.1 kg)   SpO2 96% Comment: RA  BMI 30.52 kg/m  Physical Exam  Constitutional: He is oriented to person, place, and time. He appears well-developed and well-nourished. No distress.  HENT:  Head: Normocephalic and atraumatic.  Mouth/Throat: Oropharyngeal exudate present.  Eyes: Conjunctivae and EOM are normal. No scleral icterus.  Neck: Neck supple. No thyromegaly present.  No carotid bruits  Cardiovascular: Normal rate, regular rhythm and normal heart sounds.   Normal Allen's test on the left  Pulmonary/Chest: Effort normal and breath sounds normal. No respiratory distress. He has no wheezes. He has no rales.  Abdominal: Soft. He exhibits no distension. There is no tenderness.  Musculoskeletal: He exhibits no edema or deformity.  Lymphadenopathy:    He has no cervical adenopathy.  Neurological: He is alert and oriented to person, place, and  time. No cranial nerve deficit.  No focal motor deficit  Skin: Skin is warm and dry.     Diagnostic Tests: Cardiac catheterization Conclusion     There is mild left ventricular systolic dysfunction.  LV end diastolic pressure is normal.  The left ventricular ejection fraction is 45-50% by visual estimate.  Prox RCA to Mid RCA lesion, 100 %stenosed.  Ost RCA to Prox RCA lesion, 90 %stenosed.  Ost LAD to Prox LAD lesion, 0 %stenosed.  Prox LAD to Mid LAD lesion, 75 %stenosed.  Prox Cx to Mid Cx lesion, 90 %stenosed.   1. Severe 3 vessel obstructive CAD    - 75% mid LAD    - 90% mid LCx    - 100% RCA 2. Mild LV dysfunction with normal LVEDP  Plan: will discuss with [name]. I think his best option for revascularization would be CABG. Alternative would be to  stent the LAD and LCx and treat the RCA medically but this is a large vessel supplying a large territory.       I personally reviewed the cardiac catheterization images and concur with the findings noted.  Impression: [name] is a 65 year old gentleman with multiple cardiac risk factors including a strong family history, diabetes, hyperlipidemia, and hypertension. He also has known coronary disease dating back to 2002. He now presents with exertional angina. At catheterization he was found to have severe three-vessel coronary disease with mild left ventricular dysfunction.  Coronary artery bypass grafting is indicated for survival benefit and relief of symptoms in the setting of severe three-vessel disease.  I discussed the general nature of the procedure, the need for general anesthesia, the use of cardiopulmonary bypass, and the incisions to be used with the patient and his wife. We discussed the expected hospital stay, overall recovery and short and long term outcomes. I reviewed the indications, risks, benefits, and alternatives. They understand the risks include, but are not limited to death, stroke,  MI, DVT/PE, bleeding, possible need for transfusion, infections, cardiac arrhythmias, as well as other organ system dysfunction including respiratory, renal, or GI complications. We also discussed the use of the left radial artery. He has a normal Allen's test. He does understand the risks associated with radial artery harvest including ischemia and paresthesias.  He understands and accepts the risks and agrees to proceed.  Hypertension- blood pressure mildly elevated today on good medical regimen.  Diabetes- CBG on 04/09/2016 was 314. Per Dr. Victorino December note most recent A1c was 7.1.  Hyperlipidemia- on statin  Plan: Coronary artery bypass grafting 4 with LIMA, left radial artery, and saphenous vein on Wednesday, 04/22/2016.  Melrose Nakayama, MD [facility] [phone]

## 2016-04-22 NOTE — Anesthesia Procedure Notes (Addendum)
Procedure Name: Intubation Date/Time: 04/22/2016 6:47 PM Performed by: Salli Quarry Jessly Lebeck Pre-anesthesia Checklist: Patient identified, Emergency Drugs available, Suction available and Patient being monitored Patient Re-evaluated:Patient Re-evaluated prior to inductionOxygen Delivery Method: Circle System Utilized Preoxygenation: Pre-oxygenation with 100% oxygen Intubation Type: IV induction Ventilation: Oral airway inserted - appropriate to patient size, Mask ventilation with difficulty and Two handed mask ventilation required Laryngoscope Size: Mac and 3 Grade View: Grade III Tube type: Oral Tube size: 8.5 mm Number of attempts: 1 Airway Equipment and Method: Stylet and Oral airway Placement Confirmation: ETT inserted through vocal cords under direct vision,  positive ETCO2 and breath sounds checked- equal and bilateral Secured at: 23 cm Tube secured with: Tape Dental Injury: Teeth and Oropharynx as per pre-operative assessment

## 2016-04-22 NOTE — OR Nursing (Signed)
2315 Second call made to SICU.

## 2016-04-22 NOTE — OR Nursing (Signed)
2252 First call made to SICU.

## 2016-04-23 ENCOUNTER — Other Ambulatory Visit: Payer: Self-pay

## 2016-04-23 ENCOUNTER — Encounter (HOSPITAL_COMMUNITY): Payer: Self-pay | Admitting: Thoracic Surgery (Cardiothoracic Vascular Surgery)

## 2016-04-23 ENCOUNTER — Inpatient Hospital Stay (HOSPITAL_COMMUNITY): Payer: Commercial Managed Care - HMO

## 2016-04-23 DIAGNOSIS — E1159 Type 2 diabetes mellitus with other circulatory complications: Secondary | ICD-10-CM

## 2016-04-23 DIAGNOSIS — E1165 Type 2 diabetes mellitus with hyperglycemia: Secondary | ICD-10-CM

## 2016-04-23 DIAGNOSIS — E782 Mixed hyperlipidemia: Secondary | ICD-10-CM

## 2016-04-23 DIAGNOSIS — I452 Bifascicular block: Secondary | ICD-10-CM

## 2016-04-23 DIAGNOSIS — I25708 Atherosclerosis of coronary artery bypass graft(s), unspecified, with other forms of angina pectoris: Secondary | ICD-10-CM

## 2016-04-23 LAB — POCT I-STAT, CHEM 8
BUN: 21 mg/dL — ABNORMAL HIGH (ref 6–20)
BUN: 20 mg/dL (ref 6–20)
BUN: 18 mg/dL (ref 6–20)
BUN: 19 mg/dL (ref 6–20)
BUN: 19 mg/dL (ref 6–20)
BUN: 18 mg/dL (ref 6–20)
BUN: 18 mg/dL (ref 6–20)
CALCIUM ION: 1.37 mmol/L (ref 1.15–1.40)
CALCIUM ION: 1.27 mmol/L (ref 1.15–1.40)
CALCIUM ION: 1.09 mmol/L — AB (ref 1.15–1.40)
CALCIUM ION: 1.22 mmol/L (ref 1.15–1.40)
CHLORIDE: 106 mmol/L (ref 101–111)
CHLORIDE: 101 mmol/L (ref 101–111)
CHLORIDE: 100 mmol/L — AB (ref 101–111)
CHLORIDE: 100 mmol/L — AB (ref 101–111)
CHLORIDE: 104 mmol/L (ref 101–111)
CREATININE: 0.8 mg/dL (ref 0.61–1.24)
CREATININE: 1 mg/dL (ref 0.61–1.24)
Calcium, Ion: 1.04 mmol/L — ABNORMAL LOW (ref 1.15–1.40)
Calcium, Ion: 1.11 mmol/L — ABNORMAL LOW (ref 1.15–1.40)
Calcium, Ion: 1.23 mmol/L (ref 1.15–1.40)
Chloride: 105 mmol/L (ref 101–111)
Chloride: 105 mmol/L (ref 101–111)
Creatinine, Ser: 0.8 mg/dL (ref 0.61–1.24)
Creatinine, Ser: 0.8 mg/dL (ref 0.61–1.24)
Creatinine, Ser: 0.6 mg/dL — ABNORMAL LOW (ref 0.61–1.24)
Creatinine, Ser: 0.8 mg/dL (ref 0.61–1.24)
Creatinine, Ser: 0.9 mg/dL (ref 0.61–1.24)
GLUCOSE: 136 mg/dL — AB (ref 65–99)
GLUCOSE: 106 mg/dL — AB (ref 65–99)
Glucose, Bld: 104 mg/dL — ABNORMAL HIGH (ref 65–99)
Glucose, Bld: 154 mg/dL — ABNORMAL HIGH (ref 65–99)
Glucose, Bld: 170 mg/dL — ABNORMAL HIGH (ref 65–99)
Glucose, Bld: 214 mg/dL — ABNORMAL HIGH (ref 65–99)
Glucose, Bld: 119 mg/dL — ABNORMAL HIGH (ref 65–99)
HCT: 40 % (ref 39.0–52.0)
HCT: 37 % — ABNORMAL LOW (ref 39.0–52.0)
HCT: 26 % — ABNORMAL LOW (ref 39.0–52.0)
HEMATOCRIT: 33 % — AB (ref 39.0–52.0)
HEMATOCRIT: 30 % — AB (ref 39.0–52.0)
HEMATOCRIT: 31 % — AB (ref 39.0–52.0)
HEMATOCRIT: 30 % — AB (ref 39.0–52.0)
HEMOGLOBIN: 12.6 g/dL — AB (ref 13.0–17.0)
HEMOGLOBIN: 10.5 g/dL — AB (ref 13.0–17.0)
Hemoglobin: 13.6 g/dL (ref 13.0–17.0)
Hemoglobin: 11.2 g/dL — ABNORMAL LOW (ref 13.0–17.0)
Hemoglobin: 8.8 g/dL — ABNORMAL LOW (ref 13.0–17.0)
Hemoglobin: 10.2 g/dL — ABNORMAL LOW (ref 13.0–17.0)
Hemoglobin: 10.2 g/dL — ABNORMAL LOW (ref 13.0–17.0)
POTASSIUM: 4.6 mmol/L (ref 3.5–5.1)
POTASSIUM: 4.8 mmol/L (ref 3.5–5.1)
POTASSIUM: 6.1 mmol/L — AB (ref 3.5–5.1)
POTASSIUM: 5 mmol/L (ref 3.5–5.1)
POTASSIUM: 4.4 mmol/L (ref 3.5–5.1)
Potassium: 4.3 mmol/L (ref 3.5–5.1)
Potassium: 3.9 mmol/L (ref 3.5–5.1)
SODIUM: 141 mmol/L (ref 135–145)
SODIUM: 139 mmol/L (ref 135–145)
SODIUM: 131 mmol/L — AB (ref 135–145)
SODIUM: 134 mmol/L — AB (ref 135–145)
SODIUM: 142 mmol/L (ref 135–145)
Sodium: 141 mmol/L (ref 135–145)
Sodium: 139 mmol/L (ref 135–145)
TCO2: 27 mmol/L (ref 0–100)
TCO2: 24 mmol/L (ref 0–100)
TCO2: 24 mmol/L (ref 0–100)
TCO2: 24 mmol/L (ref 0–100)
TCO2: 23 mmol/L (ref 0–100)
TCO2: 23 mmol/L (ref 0–100)
TCO2: 22 mmol/L (ref 0–100)

## 2016-04-23 LAB — CREATININE, SERUM
Creatinine, Ser: 0.93 mg/dL (ref 0.61–1.24)
GFR calc Af Amer: >60 mL/min (ref >60)
GFR calc non Af Amer: >60 mL/min (ref >60)

## 2016-04-23 LAB — POCT I-STAT 3, ART BLOOD GAS (G3+)
ACID-BASE DEFICIT: 4 mmol/L — AB (ref 0.0–2.0)
ACID-BASE DEFICIT: 3 mmol/L — AB (ref 0.0–2.0)
Acid-Base Excess: 1 mmol/L (ref 0.0–2.0)
Acid-base deficit: 3 mmol/L — ABNORMAL HIGH (ref 0.0–2.0)
Acid-base deficit: 2 mmol/L (ref 0.0–2.0)
BICARBONATE: 23.8 mmol/L (ref 20.0–28.0)
BICARBONATE: 21.9 mmol/L (ref 20.0–28.0)
Bicarbonate: 22.1 mmol/L (ref 20.0–28.0)
Bicarbonate: 22.5 mmol/L (ref 20.0–28.0)
Bicarbonate: 27.4 mmol/L (ref 20.0–28.0)
O2 SAT: 97 %
O2 Saturation: 90 %
O2 Saturation: 90 %
O2 Saturation: 100 %
O2 Saturation: 100 %
PCO2 ART: 41.4 mmHg (ref 32.0–48.0)
PCO2 ART: 41.8 mmHg (ref 32.0–48.0)
PCO2 ART: 47.8 mmHg (ref 32.0–48.0)
PH ART: 7.33 — AB (ref 7.350–7.450)
PH ART: 7.338 — AB (ref 7.350–7.450)
PH ART: 7.366 (ref 7.350–7.450)
PH ART: 7.367 (ref 7.350–7.450)
PO2 ART: 60 mmHg — AB (ref 83.0–108.0)
Patient temperature: 36.5
TCO2: 23 mmol/L (ref 0–100)
TCO2: 23 mmol/L (ref 0–100)
TCO2: 24 mmol/L (ref 0–100)
TCO2: 29 mmol/L (ref 0–100)
TCO2: 25 mmol/L (ref 0–100)
pCO2 arterial: 42 mmHg (ref 32.0–48.0)
pCO2 arterial: 38.9 mmHg (ref 32.0–48.0)
pH, Arterial: 7.358 (ref 7.350–7.450)
pO2, Arterial: 392 mmHg — ABNORMAL HIGH (ref 83.0–108.0)
pO2, Arterial: 375 mmHg — ABNORMAL HIGH (ref 83.0–108.0)
pO2, Arterial: 102 mmHg (ref 83.0–108.0)
pO2, Arterial: 62 mmHg — ABNORMAL LOW (ref 83.0–108.0)

## 2016-04-23 LAB — CBC
HCT: 33 % — ABNORMAL LOW (ref 39.0–52.0)
HEMATOCRIT: 32.1 % — AB (ref 39.0–52.0)
HEMATOCRIT: 31.2 % — AB (ref 39.0–52.0)
HEMOGLOBIN: 10.4 g/dL — AB (ref 13.0–17.0)
Hemoglobin: 10.6 g/dL — ABNORMAL LOW (ref 13.0–17.0)
Hemoglobin: 11.1 g/dL — ABNORMAL LOW (ref 13.0–17.0)
MCH: 29.7 pg (ref 26.0–34.0)
MCH: 29.8 pg (ref 26.0–34.0)
MCH: 29.9 pg (ref 26.0–34.0)
MCHC: 33 g/dL (ref 30.0–36.0)
MCHC: 33.3 g/dL (ref 30.0–36.0)
MCHC: 33.6 g/dL (ref 30.0–36.0)
MCV: 88.5 fL (ref 78.0–100.0)
MCV: 89.7 fL (ref 78.0–100.0)
MCV: 89.9 fL (ref 78.0–100.0)
PLATELETS: 118 10*3/uL — AB (ref 150–400)
Platelets: 118 10*3/uL — ABNORMAL LOW (ref 150–400)
Platelets: 149 10*3/uL — ABNORMAL LOW (ref 150–400)
RBC: 3.48 MIL/uL — ABNORMAL LOW (ref 4.22–5.81)
RBC: 3.57 MIL/uL — ABNORMAL LOW (ref 4.22–5.81)
RBC: 3.73 MIL/uL — AB (ref 4.22–5.81)
RDW: 12.7 % (ref 11.5–15.5)
RDW: 13 % (ref 11.5–15.5)
RDW: 13.2 % (ref 11.5–15.5)
WBC: 11.2 10*3/uL — AB (ref 4.0–10.5)
WBC: 14.3 10*3/uL — ABNORMAL HIGH (ref 4.0–10.5)
WBC: 9.4 10*3/uL (ref 4.0–10.5)

## 2016-04-23 LAB — GLUCOSE, CAPILLARY
GLUCOSE-CAPILLARY: 127 mg/dL — AB (ref 65–99)
GLUCOSE-CAPILLARY: 117 mg/dL — AB (ref 65–99)
GLUCOSE-CAPILLARY: 121 mg/dL — AB (ref 65–99)
GLUCOSE-CAPILLARY: 154 mg/dL — AB (ref 65–99)
GLUCOSE-CAPILLARY: 183 mg/dL — AB (ref 65–99)
GLUCOSE-CAPILLARY: 136 mg/dL — AB (ref 65–99)
GLUCOSE-CAPILLARY: 123 mg/dL — AB (ref 65–99)
GLUCOSE-CAPILLARY: 116 mg/dL — AB (ref 65–99)
GLUCOSE-CAPILLARY: 120 mg/dL — AB (ref 65–99)
GLUCOSE-CAPILLARY: 102 mg/dL — AB (ref 65–99)
GLUCOSE-CAPILLARY: 119 mg/dL — AB (ref 65–99)
GLUCOSE-CAPILLARY: 105 mg/dL — AB (ref 65–99)
GLUCOSE-CAPILLARY: 107 mg/dL — AB (ref 65–99)
GLUCOSE-CAPILLARY: 107 mg/dL — AB (ref 65–99)
GLUCOSE-CAPILLARY: 93 mg/dL (ref 65–99)
Glucose-Capillary: 148 mg/dL — ABNORMAL HIGH (ref 65–99)
Glucose-Capillary: 137 mg/dL — ABNORMAL HIGH (ref 65–99)
Glucose-Capillary: 107 mg/dL — ABNORMAL HIGH (ref 65–99)
Glucose-Capillary: 109 mg/dL — ABNORMAL HIGH (ref 65–99)
Glucose-Capillary: 114 mg/dL — ABNORMAL HIGH (ref 65–99)
Glucose-Capillary: 107 mg/dL — ABNORMAL HIGH (ref 65–99)

## 2016-04-23 LAB — PROTIME-INR
INR: 1.35
Prothrombin Time: 16.8 seconds — ABNORMAL HIGH (ref 11.4–15.2)

## 2016-04-23 LAB — BASIC METABOLIC PANEL
ANION GAP: 6 (ref 5–15)
BUN: 16 mg/dL (ref 6–20)
CALCIUM: 8.4 mg/dL — AB (ref 8.9–10.3)
CHLORIDE: 110 mmol/L (ref 101–111)
CO2: 23 mmol/L (ref 22–32)
Creatinine, Ser: 0.93 mg/dL (ref 0.61–1.24)
GFR calc Af Amer: >60 mL/min (ref >60)
GFR calc non Af Amer: >60 mL/min (ref >60)
GLUCOSE: 116 mg/dL — AB (ref 65–99)
POTASSIUM: 3.9 mmol/L (ref 3.5–5.1)
Sodium: 139 mmol/L (ref 135–145)

## 2016-04-23 LAB — POCT I-STAT 4, (NA,K, GLUC, HGB,HCT)
Glucose, Bld: 181 mg/dL — ABNORMAL HIGH (ref 65–99)
HCT: 33 % — ABNORMAL LOW (ref 39.0–52.0)
Hemoglobin: 11.2 g/dL — ABNORMAL LOW (ref 13.0–17.0)
POTASSIUM: 4.2 mmol/L (ref 3.5–5.1)
Sodium: 139 mmol/L (ref 135–145)

## 2016-04-23 LAB — MAGNESIUM
MAGNESIUM: 2 mg/dL (ref 1.7–2.4)
Magnesium: 2.3 mg/dL (ref 1.7–2.4)

## 2016-04-23 LAB — APTT: aPTT: 26 seconds (ref 24–36)

## 2016-04-23 MED ORDER — ORAL CARE MOUTH RINSE
15.0000 mL | Freq: Four times a day (QID) | OROMUCOSAL | Status: DC
  Administered 2016-04-23 (×2): 15 mL via OROMUCOSAL

## 2016-04-23 MED ORDER — ISOSORBIDE MONONITRATE ER 30 MG PO TB24
30.0000 mg | ORAL_TABLET | Freq: Every day | ORAL | Status: DC
  Administered 2016-04-23 – 2016-04-27 (×5): 30 mg via ORAL
  Filled 2016-04-23 (×5): qty 1

## 2016-04-23 MED ORDER — ENOXAPARIN SODIUM 40 MG/0.4ML ~~LOC~~ SOLN
40.0000 mg | SUBCUTANEOUS | Status: DC
  Administered 2016-04-23 – 2016-04-26 (×4): 40 mg via SUBCUTANEOUS
  Filled 2016-04-23 (×4): qty 0.4

## 2016-04-23 MED ORDER — CHLORHEXIDINE GLUCONATE 0.12% ORAL RINSE (MEDLINE KIT)
15.0000 mL | Freq: Two times a day (BID) | OROMUCOSAL | Status: DC
  Administered 2016-04-23 (×3): 15 mL via OROMUCOSAL

## 2016-04-23 MED ORDER — NITROGLYCERIN IN D5W 200-5 MCG/ML-% IV SOLN: 0.0000 ug/min | INTRAVENOUS | Status: DC

## 2016-04-23 NOTE — CV Procedure (Signed)
Intra-operative Transesophageal Echocardiography Report:  Jonathan Shepard is a 65 year old man with a history of coronary artery disease with previous stents placed in 2002 in 2008, hypertension, type 2 diabetes, obesity, and mixed hyperlipidemia who presented with progressive throat discomfort with exertion. Cardiac catheterization revealed severe three-vessel coronary disease with 70% LAD stenosis, 90% left circumflex stenosis, and occluded right coronary artery. The left ventricular ejection fraction was estimated at 50%. He is now scheduled to undergo coronary artery bypass grafting by Dr. Roxan Hockey. Intra-operative transesophageal echocardiography was indicated to evaluate the right and left ventricular function, to serve as a monitor for intraoperative volume status, and assess for any valvular pathology.  The patient was brought to the operating room at [facility] and general anesthesia was induced without difficulty. Following endotracheal intubation and orogastric suctioning, the transesophageal echocardiography probe was inserted in the esophagus without difficulty.  Impression: Pre-bypass findings:  1. Aortic valve: The aortic valve was trileaflet. The leaflets opened normally without restriction. There was no aortic insufficiency. There was mild leaflet thickening but no calcification noted.  2. Mitral valve: The mitral leaflets opened normally without restriction. There was trace mitral insufficiency. There were no prolapsing or flail leaflet segments noted. There was no significant mitral annular calcification noted.  3. Left ventricle: There was mild hypokinesis involving the inferior wall in the basilar and mid segments. The remaining left ventricular segments appeared to contract normally. The ejection fraction was estimated at 50-55%. Left ventricular end-diastolic diameter measured 42 mm at end diastole at the mid-papillary level in the transgastric short axis view. Left  ventricular wall thickness was within normal limits. The posterior wall measured 0.95 cm in the interventricular septum measured 0.98 cm at end diastole at the mid-papillary level in the transgastric short axis view.  4. Right ventricle: The right ventricular cavity was of normal size. There was normal contractility of the right ventricular free wall and normal appearing right ventricular systolic function.  5. Tricuspid valve: The tricuspid valve appeared structurally normal. There was trace tricuspid insufficiency.  6. Interatrial septum: The interatrial septum was intact without evidence of patent foramen ovale or atrial septal defect by color Doppler.  7. Left atrium: The left atrium appeared to be within normal limits of size. There was no thrombus noted within left atrium or left atrial appendage.  8. Ascending aorta: There was a well-defined aortic root and sinotubular ridge without dilatation or effacement. The aortic annulus measured 2.3 cm.  9. Descending aorta: The descending aorta measured 2.3 cm and there was no significant atheromatous disease noted.  Post-bypass findings:   1. Aortic valve: The aortic valve appeared unchanged from the pre-bypass study. The leaflets opened normally and there was no aortic insufficiency.  2. Mitral valve: The mitral valve appeared unchanged from the pre-bypass study. There was trace mitral insufficiency and the mitral leaflets opened normally.  3. Left ventricle: There was again mild left ventricular hypokinesis involving the inferior wall in the basilar and mid segments. The remaining left ventricular segments appear to contract normally. There was no air noted within the left ventricular cavity. The ejection fraction was estimated at 50-55%.  4. Right ventricle: The right ventricular cavity was of normal size. There was normal contractility the right ventricular free wall.  5. Tricuspid valve: The tricuspid valve leaflets appeared to open  normally was trace tricuspid insufficiency.  Roberts Gaudy, M.D.

## 2016-04-23 NOTE — Consult Note (Signed)
Reason for Consult: Bifascicular block after CABG  Requesting Physician: Roxan Hockey  Cardiologist: Angas Isabell  HPI: This is a 65 y.o. male with a past medical history significant for CAD, low normal LV function, DM, HTN now postop day# 2 after elective CABG:  SVG to diagonal SVG to PDA Left Radial artery to OM 2 LIMA to LAD  He has done very well postop, does not require pressors, invasive hemodynamic measurements show excellent values. He has substantial pain at the surgical site, but otherwise no complaints. Pain medicines are working reasonably well.  Early this morning his electrocardiogram showed sinus rhythm with a rate of 92 bpm and right bundle branch block and left anterior fascicular block. A repeat electrocardiogram performed at 8 AM shows normal sinus rhythm with normal intraventricular conduction pattern, at a rate of 87 bpm. There are no repolarization abnormalities suggestive of ischemia seen on either tracing. Metabolic parameters are normal. He has very mild postoperative anemia.  PMHx:  Past Medical History:  Diagnosis Date  . CAD (coronary artery disease)   . Diabetes ([place])   . Dyspnea   . Hyperlipidemia   . Hypertension   . Obesity    Past Surgical History:  Procedure Laterality Date  . CARDIAC CATHETERIZATION N/A 04/14/2016   Procedure: Left Heart Cath and Coronary Angiography;  Surgeon: Peter M Martinique, MD;  Location: [facility];  Service: Cardiovascular;  Laterality: N/A;    FAMHx: Family History  Problem Relation Age of Onset  . Coronary artery disease Father 56    CABG  . Coronary artery disease Brother 74    CABG    SOCHx:  reports that he has never smoked. He has never used smokeless tobacco. He reports that he does not drink alcohol or use drugs.  ALLERGIES: No Known Allergies  ROS: Pertinent items noted in HPI and remainder of comprehensive ROS otherwise negative.  HOME MEDICATIONS: Prescriptions Prior to Admission    Medication Sig Dispense Refill Last Dose  . aspirin EC 81 MG tablet Take 81 mg by mouth at bedtime.    04/22/2016 at 0700  . Choline Fenofibrate (FENOFIBRIC ACID) 45 MG CPDR TAKE 1 CAPSULE ONE TIME DAILY (Patient taking differently: take 77ms at bedtime) 90 capsule 2 Past Week at Unknown time  . clopidogrel (PLAVIX) 75 MG tablet TAKE 1 TABLET DAILY (NEEDS APPOINTMENT FOR REFILLS.) 90 tablet 2 04-17-16  . Coenzyme Q10 (CO Q10 PO) Take 30 mg by mouth daily.    Past Week at Unknown time  . diclofenac (VOLTAREN) 75 MG EC tablet Take 1 tablet by mouth 2 (two) times daily.   04/21/2016 at Unknown time  . glimepiride (AMARYL) 4 MG tablet Take 4 mg by mouth 2 (two) times daily.    04/21/2016 at Unknown time  . HYDROcodone-acetaminophen (NORCO) 10-325 MG tablet Take 1 tablet by mouth every 8 (eight) hours as needed for moderate pain.   04/22/2016 at 0700  . ibuprofen (ADVIL,MOTRIN) 200 MG tablet Take 200-400 mg by mouth every 6 (six) hours as needed for headache.   Past Month at Unknown time  . isosorbide dinitrate (ISORDIL) 10 MG tablet Take 10 mg by mouth every morning.  1 04/22/2016 at 0700  . metFORMIN (GLUCOPHAGE) 1000 MG tablet Take 1,000 mg by mouth 2 (two) times daily with a meal.   04/21/2016 at Unknown time  . metoprolol succinate (TOPROL-XL) 25 MG 24 hr tablet TAKE 2 TABLETS EVERY DAY (Patient taking differently: take 256m twice daily) 180  tablet 2 04/22/2016 at 0700  . Multiple Vitamin (MULTIVITAMIN) tablet Take 1 tablet by mouth daily.   Past Week at Unknown time  . nitroGLYCERIN (NITROSTAT) 0.4 MG SL tablet Place 1 tablet (0.4 mg total) under the tongue every 5 (five) minutes as needed for chest pain. 25 tablet 3 Past Week at Unknown time  . Omega-3 Fatty Acids (FISH OIL) 1200 MG CAPS Take 1 capsule by mouth 2 (two) times daily.    04/21/2016 at Unknown time  . pravastatin (PRAVACHOL) 80 MG tablet TAKE 1 TABLET EVERY DAY (Patient taking differently: take 76ms at bedtime) 90 tablet 2 Past  Week at Unknown time  . ramipril (ALTACE) 2.5 MG capsule TAKE 1 CAPSULE EVERY DAY 90 capsule 2 04/22/2016 at [place] I have reviewed the patient's current medications. .Marland Kitchenacetaminophen  1,000 mg Oral Q6H   Or  . acetaminophen (TYLENOL) oral liquid 160 mg/5 mL  1,000 mg Per Tube Q6H  . aspirin EC  325 mg Oral Daily   Or  . aspirin  324 mg Per Tube Daily  . bisacodyl  10 mg Oral Daily   Or  . bisacodyl  10 mg Rectal Daily  . cefUROXime (ZINACEF)  IV  1.5 g Intravenous Q12H  . chlorhexidine gluconate (MEDLINE KIT)  15 mL Mouth Rinse BID  . docusate sodium  200 mg Oral Daily  . insulin regular  0-10 Units Intravenous TID WC  . isosorbide mononitrate  30 mg Oral Daily  . mouth rinse  15 mL Mouth Rinse QID  . metoprolol tartrate  12.5 mg Oral BID   Or  . metoprolol tartrate  12.5 mg Per Tube BID  . [START ON 04/24/2016] pantoprazole  40 mg Oral Daily  . pravastatin  80 mg Oral q1800  . sodium chloride flush  3 mL Intravenous Q12H    VITALS: Blood pressure 133/78, pulse 91, temperature 99.5 F (37.5 C), resp. rate 18, height 6' (1.829 m), weight 229 lb 0.9 oz (103.9 kg), SpO2 98 %.  PHYSICAL EXAM:  General: Alert, oriented x3, no distress. Very lightly sedated. Head: no evidence of trauma, PERRL, EOMI, no exophtalmos or lid lag, no myxedema, no xanthelasma; normal ears, nose and oropharynx Neck: normal jugular venous pulsations and no hepatojugular reflux; brisk carotid pulses without delay and no carotid bruits Chest: clear to auscultation, no signs of consolidation by percussion or palpation, normal fremitus, symmetrical and full respiratory excursions. Healthy-appearing surgical wound site and chest tube sites Cardiovascular: normal position and quality of the apical impulse, regular rhythm, normal first heart sound and normal second heart sound, no rubs or gallops, no murmur Abdomen: no tenderness or distention, no masses by palpation, no abnormal pulsatility  or arterial bruits, normal bowel sounds, no hepatosplenomegaly Extremities: no clubbing, cyanosis;  no edema; 2+ radial, ulnar and brachial pulses bilaterally; 2+ right femoral, posterior tibial and dorsalis pedis pulses; 2+ left femoral, posterior tibial and dorsalis pedis pulses; no subclavian or femoral bruits Neurological: grossly nonfocal   LABS  CBC  Recent Labs  04/23/16 0008 04/23/16 0459 04/23/16 0515  WBC 9.4  --  11.2*  HGB 11.1* 10.5* 10.6*  HCT 33.0* 31.0* 32.1*  MCV 88.5  --  89.9  PLT 118*  --  1163   Basic Metabolic Panel  Recent Labs  04/20/16 1558  04/23/16 0459 04/23/16 0515  NA 138  < > 142 139  K 3.9  < > 3.9 3.9  CL 105  --  105 110  CO2 22  --   --  23  GLUCOSE 116*  < > 119* 116*  BUN 21*  --  18 16  CREATININE 1.08  --  0.90 0.93  CALCIUM 10.1  --   --  8.4*  MG  --   --   --  2.3  < > = values in this interval not displayed. Liver Function Tests  Recent Labs  04/20/16 1558  AST 49*  ALT 68*  ALKPHOS 56  BILITOT 0.6  PROT 7.2  ALBUMIN 4.3     Recent Labs  04/20/16 1558  HGBA1C 7.6*    IMAGING: Dg Chest Port 1 View  Result Date: 04/23/2016 CLINICAL DATA:  Status post CABG procedure. EXAM: PORTABLE CHEST 1 VIEW COMPARISON:  04/22/2016 FINDINGS: There has been interval removal of the nasogastric tube and endotracheal tube. Swan-Ganz catheter remains in place. Bilateral chest tubes are identified. No pneumothorax. Atelectasis is identified in the left base. IMPRESSION: 1. Status post extubation. 2. Bilateral chest tubes are in place without evidence for pneumothorax. Electronically Signed   By: Kerby Moors M.D.   On: 04/23/2016 09:00   Dg Chest Port 1 View  Result Date: 04/23/2016 CLINICAL DATA:  Postop heart search EXAM: PORTABLE CHEST 1 VIEW COMPARISON:  04/20/2016 FINDINGS: The endotracheal tube is 5.3 cm above the carina. Bilateral chest tube/mediastinal drain present. Right jugular Swan-Ganz catheter with tip at the level  of the pulmonic root. Nasogastric tube extends into the stomach and beyond the inferior edge of the image. No pneumothorax. Mild left base atelectatic appearing opacities. No significant effusion. Normal pulmonary vasculature. IMPRESSION: Support equipment appears satisfactorily positioned. No pneumothorax. Mild atelectatic appearing left base opacities. Electronically Signed   By: Andreas Newport M.D.   On: 04/23/2016 00:23    ECG: sinus rhythm with transient right bundle branch block and left anterior fascicular block described above  TELEMETRY:  sinus rhythm with very rare PVCs. Had very brief bradycardia late last night  IMPRESSION: 1. CAD day #2 post CABG excellent progress so far 2. Transient bifascicular block, resolved. May have been rate related 3. DM 4. HTN 5. HLP  RECOMMENDATION: 1. No indication for pacemaker at this time 2. Continue present medications. I don't think the transient conduction abnormalities are a reason to stop his metoprolol. Plan to titrate the dose up gradually, watching for worsening conduction. His need for beta blocker therapy will probably be lower since we were using it primarily as an antianginal. Can use higher doses of ramipril for his blood pressure control if needed. 3. Will monitor for further ECG/rhythm abnormalities.  Time Spent Directly with Patient: 35 minutes  Sanda Klein, MD, [facility] [phone] office [phone] pager   04/23/2016, 11:03 AM

## 2016-04-23 NOTE — Progress Notes (Signed)
Patient ID: Jonathan Shepard, male   DOB: 12-24-1950, 65 y.o.   MRN: [id number]   SICU Evening Rounds:   Hemodynamically stable in sinus rhythm  Remains on insulin drip 5 units per hr.   Urine output good    CBC    Component Value Date/Time   WBC 14.3 (H) 04/23/2016 1653   RBC 3.48 (L) 04/23/2016 1653   HGB 10.4 (L) 04/23/2016 1653   HCT 31.2 (L) 04/23/2016 1653   PLT 149 (L) 04/23/2016 1653   MCV 89.7 04/23/2016 1653   MCH 29.9 04/23/2016 1653   MCHC 33.3 04/23/2016 1653   RDW 13.2 04/23/2016 1653     BMET    Component Value Date/Time   NA 139 04/23/2016 1650   K 4.4 04/23/2016 1650   CL 104 04/23/2016 1650   CO2 23 04/23/2016 0515   GLUCOSE 106 (H) 04/23/2016 1650   BUN 18 04/23/2016 1650   CREATININE 0.93 04/23/2016 1653   CREATININE 1.19 04/09/2016 0923   CALCIUM 8.4 (L) 04/23/2016 0515   GFRNONAA >60 04/23/2016 1653   GFRAA >60 04/23/2016 1653     A/P:  Stable postop course. Continue current plans. Will keep insulin drip going tonight.

## 2016-04-23 NOTE — Addendum Note (Signed)
Addendum  created 04/23/16 0058 by Roberts Gaudy, MD   Order list changed

## 2016-04-23 NOTE — Op Note (Signed)
Jonathan Shepard, Jonathan Shepard             ACCOUNT NO.:  [id number]  MEDICAL RECORD NO.:  [id number]  LOCATION:  2S15C                        FACILITY:  [facility]  PHYSICIAN:  Revonda Standard. Roxan Hockey, M.D.DATE OF BIRTH:  12/11/1950  DATE OF PROCEDURE:  04/22/2016 DATE OF DISCHARGE:                              OPERATIVE REPORT   PREOPERATIVE DIAGNOSIS:  Three-vessel coronary artery disease.  POSTOPERATIVE DIAGNOSIS:  Three-vessel coronary artery disease.  PROCEDURE:  Median sternotomy, extracorporeal circulation,  Coronary artery bypass grafting x 4  Left internal mammary artery to LAD,  Left radial artery to OM2,  Saphenous vein graft to 1st diagonal,  Saphenous vein graft to posterior descending Endoscopic vein harvest right thigh.  SURGEON:  Revonda Standard. Roxan Hockey, MD.  ASSISTANT:  Nicholes Rough, PA-C .  SECOND ASSISTANT:  John Giovanni, PA-C.  ANESTHESIA:  General.  FINDINGS:  Transesophageal echocardiography revealed preserved left ventricular function with no significant valvular pathology.  Good quality conduits.  Mammary relatively small, but with good flow. Coronary arteries small.  Diagonal was 1 mm, remaining targets did accept a 1.5-mm probe.  CLINICAL NOTE:  Jonathan Shepard is a 65 year old gentleman with multiple coronary risk factors and a history of coronary artery disease dating back to 2002.  He has been experiencing exertional angina.  At catheterization, he was found to have severe three-vessel disease with a 70% LAD stenosis, 90% circumflex stenosis, and totally occluded right coronary.  Ejection fraction was estimated at 50%.  He was advised to undergo coronary artery bypass grafting.  The indications, risks, benefits, and alternatives were discussed in detail with the patient. He understood and accepted the risks and agreed to proceed.  OPERATIVE NOTE:  Jonathan Shepard was brought to the preoperative holding area on April 22, 2016.  Anesthesia placed a Swan-Ganz  catheter and an arterial blood pressure monitoring line.  Intravenous antibiotics were administered.  He was taken to the operating room, anesthetized, and intubated.  A Foley catheter was placed.  Transesophageal echocardiography was performed by Dr. Roberts Gaudy of the Anesthesia Service.  Findings as previously noted.  The chest, abdomen, legs, and left arm were prepped and draped in the usual sterile fashion.  A median sternotomy was performed and the left internal mammary artery was harvested under direct vision.  Simultaneously, an incision was made in the volar aspect of the left wrist.  The radial artery was harvested. A small incision was made distally and the Allen's test was confirmed with Doppler with proximal occlusion prior to extending the incision to just below the antecubital fossa.  The radial artery was harvested with the Harmonic Scalpel.  Simultaneously, an incision was made just below the knee on the right leg.  The greater saphenous vein was identified and was harvested from the right thigh endoscopically.  2000 units of heparin was administered during the vessel harvest.  The mammary was relatively small, but had an excellent flow.  The radial and saphenous vein both were excellent quality vessels.  After the harvest of the conduits, the left radial incision and leg incisions were closed.  The left arm was tucked at the patient's side.  The pericardium was opened. The remainder of the full heparin dose was  given.  The ascending aorta was inspected and it was free of atherosclerotic disease.  After confirming adequate anticoagulation with ACT measurement, the aorta was cannulated via concentric 2-0 Ethibond pledgeted pursestring sutures.  A dual stage venous cannula was placed via a pursestring suture in the right atrial appendage.  Cardiopulmonary bypass was initiated.  Flows were maintained per protocol.  The patient was cooled to 32 degrees Celsius. The coronary  arteries were inspected and anastomotic sites were chosen. The conduits were inspected and cut to length.  A foam pad was placed in the pericardium to insulate the heart.  A temperature probe was placed in the myocardial septum and a cardioplegia cannula was placed in the ascending aorta. The aorta was crossclamped.  The left ventricle was emptied via the aortic root vent.  Cardiac arrest then was achieved with combination of cold antegrade blood cardioplegia and topical iced saline.  1 L of cold antegrade cardioplegia was administered, there was a rapid diastolic arrest.  Septal cooling initially was slow, but then cooled to 12 degrees Celsius.  A reversed saphenous vein graft was placed end-to-side to the posterior descending branch of the right coronary.  This branch arose just past the acute margin.  It was a 1.5 mm vessel that did accept a 1.5 mm probe, but was a relatively small vessel.  The vein was of good quality. The end-to-side anastomosis was performed with a running 7-0 Prolene suture.  All anastomoses were probed proximally and distally to ensure patency prior to tying the suture.  Cardioplegia was administered down the graft.  There was good flow and good hemostasis.  Next, a reversed saphenous vein graft was placed end-to-side to the first diagonal branch to the LAD.  This vessel was a 1 mm poor quality target. The vein was of good quality.  The end-to-side anastomosis was performed with a running 7-0 Prolene suture.  There was a better than expected flow through the graft and good hemostasis.  Additional cardioplegia was administered down both vein grafts as well as the aortic root.  The heart was elevated exposing the lateral wall.  The OM2 was a 1.5 mm good quality target.  The OM3 was not a graftable vessel, it was less than a mm in diameter.  The distal end of the left radial was beveled and it was anastomosed end-to-side to OM2 with a running 8-0 Prolene suture.   A probe passed easily proximally and distally.  Cardioplegia was administered down the vein grafts and aortic root and there was good backbleeding from the radial artery.  The left internal mammary artery was brought through a window in the pericardium.  The distal end was beveled, it was then anastomosed end-to- side to the distal LAD.  The LAD was intramyocardial more proximally. It was grafted where it came back to the epicardial surface.  It accepted a 1.5 mm probe, it was relatively a small vessel.  The mammary also was relatively small, but as previously mentioned it had good flow. An end-to-side anastomosis was performed with a running 8-0 Prolene suture.  At the completion of the anastomosis, a probe passed easily proximally and distally as well as proximally into the mammary artery. After tying the suture, the bulldog clamp was released and rapid septal rewarming was noted.  The bulldog clamp was replaced.  The mammary pedicle was tacked to the epicardial surface of the heart with 6-0 Prolene sutures.  Additional cardioplegia was administered.  The vein grafts were cut to length.  The cardioplegia cannula was removed from the ascending aorta. The proximal vein graft anastomoses were performed to 4.5 mm punch aortotomies with running 6-0 Prolene sutures.  At the completion of final proximal vein graft anastomosis, the patient was placed in Trendelenburg position.  Lidocaine was administered.  The aortic root was de-aired and the aortic crossclamp was removed.  The patient required a single defibrillation with 10 joules and then was in sinus rhythm thereafter. The cross clamp time was 70 min.  While rewarming was completed, bulldog clamps were placed proximally and distally on the vein to the diagonal.  A venotomy was made.  The proximal end of the radial artery was beveled and it was anastomosed end- to-side to the vein graft as a Y-graft.  This was done with a running 7- 0  Prolene suture.  All proximal and distal anastomoses were inspected for hemostasis.  Epicardial pacing wires were placed on the right ventricle and right atrium.  When the patient had rewarmed to a core temperature of 37 degrees Celsius, he was weaned from cardiopulmonary bypass on the first attempt.  The patient was in sinus rhythm and on no inotropic support at the time of separation from bypass. Total bypass time was 112 minutes.  A test dose of protamine was administered and was well tolerated.  The atrial and aortic cannulae were removed.  The remainder of the protamine was administered without incident.  Post bypass transesophageal echocardiography was unchanged from the prebypass study.  The chest was copiously irrigated with warm saline.  Hemostasis was achieved.  The pericardium was reapproximated over the heart with interrupted 3-0 silk sutures.  The left pleural mediastinal chest tubes were placed through separate subcostal incisions.  The sternum was closed with a combination of single and double heavy gauge stainless steel wires.  The pectoralis fascia, subcutaneous tissue, and skin were closed in standard fashion.  All sponge, needle, and instrument counts were correct at the end of the procedure.  The patient was taken from the operating room to the Surgical Intensive Care Unit intubated and in good condition.     Revonda Standard Roxan Hockey, M.D.     SCH/MEDQ  D:  04/23/2016  T:  04/23/2016  Job:  [id number]

## 2016-04-23 NOTE — Progress Notes (Signed)
RT NOTE:  Per Cardiac Rapid Wean patient switched to CPAP/PS.

## 2016-04-23 NOTE — Procedures (Signed)
Extubation Procedure Note Pt completed Cardiac Rapid Wean without complication. Pt follows all commands. ABG within acceptable range. Cuff leak present.  Parameters: NIF -28, VC 850 mls.  Pt extubated to 3L [place] (SpO2 99%), No Stridor, cough/clears secretions. Speaks name/location.   Patient Details:   Name: Jonathan Shepard DOB: [birth year] MRN: [id number]   Airway Documentation:  Pt extubated   Evaluation  O2 sats: stable throughout Complications: No apparent complications Patient did tolerate procedure well. Bilateral Breath Sounds: Clear   Yes  Sharen Hint 04/23/2016, 5:05 AM

## 2016-04-23 NOTE — Progress Notes (Signed)
  Echocardiogram Echocardiogram Transesophageal has been performed.  Jonathan Shepard 04/23/2016, 8:51 AM

## 2016-04-23 NOTE — Progress Notes (Addendum)
RT NOTE:  Cardiac Rapid Wean initiated. Pt following commands.  

## 2016-04-23 NOTE — Anesthesia Postprocedure Evaluation (Signed)
Anesthesia Post Note  Patient: ATLANTIS RAUSEO  Procedure(s) Performed: Procedure(s) (LRB): CORONARY ARTERY BYPASS GRAFTING (CABG)x4 with endoscopic harvesting of right saphenous vein SVG to diagonal SVG to PDA Left Radial artery to OM 2 LIMA to LAD (N/A) TRANSESOPHAGEAL ECHOCARDIOGRAM (TEE) (N/A) LEFT RADIAL ARTERY HARVEST (Left)  Patient location during evaluation: SICU Anesthesia Type: General Level of consciousness: sedated and patient remains intubated per anesthesia plan Pain management: pain level controlled Vital Signs Assessment: post-procedure vital signs reviewed and stable Respiratory status: patient on ventilator - see flowsheet for VS and patient remains intubated per anesthesia plan Cardiovascular status: blood pressure returned to baseline Anesthetic complications: no    Last Vitals:  Vitals:   04/22/16 1005 04/22/16 2347  BP: 130/64   Pulse: 62 91  Resp: 16 12  Temp: 36.7 C     Last Pain:  Vitals:   04/22/16 1005  TempSrc: Oral  PainSc:                  Roberta Kelly COKER

## 2016-04-23 NOTE — Addendum Note (Signed)
Addendum  created 04/23/16 0137 by Roberts Gaudy, MD   Sign clinical note

## 2016-04-24 ENCOUNTER — Inpatient Hospital Stay (HOSPITAL_COMMUNITY): Payer: Commercial Managed Care - HMO

## 2016-04-24 DIAGNOSIS — I25118 Atherosclerotic heart disease of native coronary artery with other forms of angina pectoris: Secondary | ICD-10-CM

## 2016-04-24 LAB — GLUCOSE, CAPILLARY
GLUCOSE-CAPILLARY: 104 mg/dL — AB (ref 65–99)
GLUCOSE-CAPILLARY: 108 mg/dL — AB (ref 65–99)
GLUCOSE-CAPILLARY: 108 mg/dL — AB (ref 65–99)
GLUCOSE-CAPILLARY: 204 mg/dL — AB (ref 65–99)
GLUCOSE-CAPILLARY: 96 mg/dL (ref 65–99)
GLUCOSE-CAPILLARY: 97 mg/dL (ref 65–99)
Glucose-Capillary: 112 mg/dL — ABNORMAL HIGH (ref 65–99)
Glucose-Capillary: 105 mg/dL — ABNORMAL HIGH (ref 65–99)
Glucose-Capillary: 116 mg/dL — ABNORMAL HIGH (ref 65–99)
Glucose-Capillary: 125 mg/dL — ABNORMAL HIGH (ref 65–99)
Glucose-Capillary: 117 mg/dL — ABNORMAL HIGH (ref 65–99)
Glucose-Capillary: 103 mg/dL — ABNORMAL HIGH (ref 65–99)
Glucose-Capillary: 105 mg/dL — ABNORMAL HIGH (ref 65–99)
Glucose-Capillary: 151 mg/dL — ABNORMAL HIGH (ref 65–99)

## 2016-04-24 LAB — BASIC METABOLIC PANEL
ANION GAP: 8 (ref 5–15)
BUN: 16 mg/dL (ref 6–20)
CHLORIDE: 106 mmol/L (ref 101–111)
CO2: 22 mmol/L (ref 22–32)
Calcium: 8.8 mg/dL — ABNORMAL LOW (ref 8.9–10.3)
Creatinine, Ser: 0.92 mg/dL (ref 0.61–1.24)
GFR calc Af Amer: >60 mL/min (ref >60)
GLUCOSE: 106 mg/dL — AB (ref 65–99)
POTASSIUM: 4.2 mmol/L (ref 3.5–5.1)
Sodium: 136 mmol/L (ref 135–145)

## 2016-04-24 LAB — CBC
HEMATOCRIT: 30 % — AB (ref 39.0–52.0)
HEMOGLOBIN: 9.8 g/dL — AB (ref 13.0–17.0)
MCH: 29.5 pg (ref 26.0–34.0)
MCHC: 32.7 g/dL (ref 30.0–36.0)
MCV: 90.4 fL (ref 78.0–100.0)
Platelets: 136 10*3/uL — ABNORMAL LOW (ref 150–400)
RBC: 3.32 MIL/uL — ABNORMAL LOW (ref 4.22–5.81)
RDW: 13.3 % (ref 11.5–15.5)
WBC: 13.6 10*3/uL — ABNORMAL HIGH (ref 4.0–10.5)

## 2016-04-24 MED ORDER — FUROSEMIDE 10 MG/ML IJ SOLN
40.0000 mg | Freq: Once | INTRAMUSCULAR | Status: AC
  Administered 2016-04-24: 40 mg via INTRAVENOUS
  Filled 2016-04-24: qty 4

## 2016-04-24 MED ORDER — INSULIN ASPART 100 UNIT/ML ~~LOC~~ SOLN
4.0000 [IU] | Freq: Three times a day (TID) | SUBCUTANEOUS | Status: DC
  Administered 2016-04-24 – 2016-04-26 (×4): 4 [IU] via SUBCUTANEOUS

## 2016-04-24 MED ORDER — GLIMEPIRIDE 4 MG PO TABS
4.0000 mg | ORAL_TABLET | Freq: Two times a day (BID) | ORAL | Status: DC
  Filled 2016-04-24: qty 1

## 2016-04-24 MED ORDER — FUROSEMIDE 40 MG PO TABS
40.0000 mg | ORAL_TABLET | Freq: Every day | ORAL | Status: DC
  Administered 2016-04-25 – 2016-04-27 (×3): 40 mg via ORAL
  Filled 2016-04-24 (×3): qty 1

## 2016-04-24 MED ORDER — CLOPIDOGREL BISULFATE 75 MG PO TABS
75.0000 mg | ORAL_TABLET | Freq: Every day | ORAL | Status: DC
  Administered 2016-04-24 – 2016-04-27 (×4): 75 mg via ORAL
  Filled 2016-04-24 (×4): qty 1

## 2016-04-24 MED ORDER — RAMIPRIL 2.5 MG PO CAPS
2.5000 mg | ORAL_CAPSULE | Freq: Every day | ORAL | Status: DC
  Administered 2016-04-24 – 2016-04-25 (×2): 2.5 mg via ORAL
  Filled 2016-04-24 (×3): qty 1

## 2016-04-24 MED ORDER — INSULIN ASPART 100 UNIT/ML ~~LOC~~ SOLN
0.0000 [IU] | Freq: Three times a day (TID) | SUBCUTANEOUS | Status: DC
  Administered 2016-04-24: 7 [IU] via SUBCUTANEOUS
  Administered 2016-04-25 – 2016-04-27 (×4): 3 [IU] via SUBCUTANEOUS

## 2016-04-24 MED ORDER — METOPROLOL TARTRATE 25 MG/10 ML ORAL SUSPENSION: 25.0000 mg | Freq: Two times a day (BID) | ORAL | Status: DC

## 2016-04-24 MED ORDER — INSULIN DETEMIR 100 UNIT/ML ~~LOC~~ SOLN
20.0000 [IU] | Freq: Two times a day (BID) | SUBCUTANEOUS | Status: DC
  Administered 2016-04-24 – 2016-04-25 (×4): 20 [IU] via SUBCUTANEOUS
  Filled 2016-04-24 (×7): qty 0.2

## 2016-04-24 MED ORDER — KETOROLAC TROMETHAMINE 15 MG/ML IJ SOLN
15.0000 mg | Freq: Four times a day (QID) | INTRAMUSCULAR | Status: AC
  Administered 2016-04-24 – 2016-04-26 (×7): 15 mg via INTRAVENOUS
  Filled 2016-04-24 (×7): qty 1

## 2016-04-24 MED ORDER — INSULIN ASPART 100 UNIT/ML ~~LOC~~ SOLN
0.0000 [IU] | Freq: Every day | SUBCUTANEOUS | Status: DC
Start: 2016-04-24 — End: 2016-04-27

## 2016-04-24 MED ORDER — METFORMIN HCL 500 MG PO TABS
1000.0000 mg | ORAL_TABLET | Freq: Two times a day (BID) | ORAL | Status: DC
  Administered 2016-04-24 – 2016-04-27 (×7): 1000 mg via ORAL
  Filled 2016-04-24 (×7): qty 2

## 2016-04-24 MED ORDER — POTASSIUM CHLORIDE CRYS ER 20 MEQ PO TBCR
20.0000 meq | EXTENDED_RELEASE_TABLET | Freq: Two times a day (BID) | ORAL | Status: DC
  Administered 2016-04-24 – 2016-04-27 (×7): 20 meq via ORAL
  Filled 2016-04-24 (×7): qty 1

## 2016-04-24 MED ORDER — METOPROLOL TARTRATE 25 MG PO TABS
25.0000 mg | ORAL_TABLET | Freq: Two times a day (BID) | ORAL | Status: DC
  Administered 2016-04-24 – 2016-04-27 (×7): 25 mg via ORAL
  Filled 2016-04-24 (×7): qty 1

## 2016-04-24 MED ORDER — GUAIFENESIN ER 600 MG PO TB12
1200.0000 mg | ORAL_TABLET | Freq: Two times a day (BID) | ORAL | Status: AC
  Administered 2016-04-24 – 2016-04-26 (×6): 1200 mg via ORAL
  Filled 2016-04-24 (×6): qty 2

## 2016-04-24 MED ORDER — ASPIRIN EC 81 MG PO TBEC
81.0000 mg | DELAYED_RELEASE_TABLET | Freq: Every day | ORAL | Status: DC
Start: 2016-04-24 — End: 2016-04-27
  Administered 2016-04-24 – 2016-04-26 (×3): 81 mg via ORAL
  Filled 2016-04-24 (×3): qty 1

## 2016-04-24 MED ORDER — GLIMEPIRIDE 4 MG PO TABS
4.0000 mg | ORAL_TABLET | Freq: Two times a day (BID) | ORAL | Status: DC
  Administered 2016-04-24 – 2016-04-27 (×7): 4 mg via ORAL
  Filled 2016-04-24 (×7): qty 1

## 2016-04-24 MED FILL — Electrolyte-R (PH 7.4) Solution: INTRAVENOUS | Qty: 4000 | Status: AC

## 2016-04-24 MED FILL — Sodium Chloride IV Soln 0.9%: INTRAVENOUS | Qty: 2000 | Status: AC

## 2016-04-24 MED FILL — Lidocaine HCl IV Inj 20 MG/ML: INTRAVENOUS | Qty: 5 | Status: AC

## 2016-04-24 MED FILL — Mannitol IV Soln 20%: INTRAVENOUS | Qty: 500 | Status: AC

## 2016-04-24 MED FILL — Sodium Bicarbonate IV Soln 8.4%: INTRAVENOUS | Qty: 50 | Status: AC

## 2016-04-24 MED FILL — Heparin Sodium (Porcine) Inj 1000 Unit/ML: INTRAMUSCULAR | Qty: 10 | Status: AC

## 2016-04-24 NOTE — Progress Notes (Signed)
2 Days Post-Op Procedure(s) (LRB): CORONARY ARTERY BYPASS GRAFTING (CABG)x4 with endoscopic harvesting of right saphenous vein SVG to diagonal SVG to PDA Left Radial artery to OM 2 LIMA to LAD (N/A) TRANSESOPHAGEAL ECHOCARDIOGRAM (TEE) (N/A) LEFT RADIAL ARTERY HARVEST (Left) Subjective: C/o pain  Objective: Vital signs in last 24 hours: Temp:  [98.5 F (36.9 C)-99.5 F (37.5 C)] 98.8 F (37.1 C) (11/17 0700) Pulse Rate:  [86-106] 106 (11/17 0600) Cardiac Rhythm: Normal sinus rhythm (11/17 0400) Resp:  [10-22] 14 (11/17 0600) BP: (133-182)/(61-98) 180/72 (11/17 0400) SpO2:  [89 %-100 %] 92 % (11/17 0600) Arterial Line BP: (88-220)/(48-75) 140/66 (11/17 0600) Weight:  [227 lb 1.2 oz (103 kg)] 227 lb 1.2 oz (103 kg) (11/17 0500)  Hemodynamic parameters for last 24 hours: PAP: (28-29)/(12-16) 28/12  Intake/Output from previous day: 11/16 0701 - 11/17 0700 In: 1016.8 [P.O.:100; I.V.:816.8; IV Piggyback:100] Out: [id number] [Urine:1270; Chest Tube:510] Intake/Output this shift: No intake/output data recorded.  General appearance: alert, cooperative and no distress Neurologic: intact Heart: tachy, regular Lungs: diminished breath sounds bibasilar Abdomen: normal findings: soft, non-tender  Lab Results:  Recent Labs  04/23/16 1653 04/24/16 0421  WBC 14.3* 13.6*  HGB 10.4* 9.8*  HCT 31.2* 30.0*  PLT 149* 136*   BMET:  Recent Labs  04/23/16 0515 04/23/16 1650 04/23/16 1653 04/24/16 0421  NA 139 139  --  136  K 3.9 4.4  --  4.2  CL 110 104  --  106  CO2 23  --   --  22  GLUCOSE 116* 106*  --  106*  BUN 16 18  --  16  CREATININE 0.93 1.00 0.93 0.92  CALCIUM 8.4*  --   --  8.8*    PT/INR:  Recent Labs  04/23/16 0008  LABPROT 16.8*  INR 1.35   ABG    Component Value Date/Time   PHART 7.358 04/23/2016 0603   HCO3 21.9 04/23/2016 0603   TCO2 22 04/23/2016 1650   ACIDBASEDEF 3.0 (H) 04/23/2016 0603   O2SAT 90.0 04/23/2016 0603   CBG (last 3)   Recent Labs  04/24/16 0447 04/24/16 0608 04/24/16 0659  GLUCAP 116* 125* 117*    Assessment/Plan: S/P Procedure(s) (LRB): CORONARY ARTERY BYPASS GRAFTING (CABG)x4 with endoscopic harvesting of right saphenous vein SVG to diagonal SVG to PDA Left Radial artery to OM 2 LIMA to LAD (N/A) TRANSESOPHAGEAL ECHOCARDIOGRAM (TEE) (N/A) LEFT RADIAL ARTERY HARVEST (Left) -CV- mildly tachy and hypertensive- increase metoprolol to 25 BID  RESP- has bibasilar atelectasis- continue IS add flutter valve and mucinex  RENAL- creatinine Ok, needs diuresis- IV lasix this AM  ENDO- CBG well controlled, insulin dose lower- convert to levemir + SSI  Restart amaryl and metformin  Anemia- mild, follow  SCD + enoxaparin for DVT prophylaxis  Mobilize  DC A line and central line, dc Foley later today   LOS: 2 days    Jonathan Shepard 04/24/2016

## 2016-04-24 NOTE — Progress Notes (Signed)
Good progress, getting ready to ambulate. No significant overnight arrhythmia. Narrow QRS complex on monitor. Cardiology will be available for questions over the weekend. Sanda Klein, MD, [facility] [phone] office [phone] pager

## 2016-04-24 NOTE — Care Management Note (Signed)
Case Management Note  Patient Details  Name: Jonathan Shepard MRN: [id number] Date of Birth: 09/15/1950  Subjective/Objective:  S/p CABG                  Action/Plan:  PTA independent from home with wife - wife will provide 24/7 supervision   Expected Discharge Date:  04/29/16               Expected Discharge Plan:  Home/Self Care  In-House Referral:     Discharge planning Services  CM Consult  Post Acute Care Choice:    Choice offered to:     DME Arranged:    DME Agency:     HH Arranged:    [place] Agency:     Status of Service:  In process, will continue to follow  If discussed at Long Length of Stay Meetings, dates discussed:    Additional Comments:  Maryclare Labrador, RN 04/24/2016, 5:02 PM

## 2016-04-24 NOTE — Treatment Plan (Signed)
[facility]        Please arrange 8 2 week cardiology appointment. Status post CABG on 04/22/2016 by S C H. Cardiologist is Dr. Martinique.   Thank you      F/u scheduled with Rosaria Ferries PA-C on 05/08/16 at 8:30am. Appt info placed in epic. Farid Grigorian PA-C

## 2016-04-24 NOTE — Discharge Summary (Signed)
Physician Discharge Summary  Patient ID: Jonathan Shepard MRN: [id number] DOB/AGE: Jan 22, 1951 65 y.o.  Admit date: 04/22/2016 Discharge date: 04/27/2016  Admission Diagnoses:Severe coronary artery disease  Discharge Diagnoses:  Active Problems:   CAD (coronary artery disease)   Diabetes mellitus ([place])   Mixed hyperlipidemia   PAD (peripheral artery disease) ([facility])   S/P CABG x 4   Transient Bifascicular block: RBBB+LAFB  Patient Active Problem List   Diagnosis Date Noted  . Transient Bifascicular block: RBBB+LAFB 04/23/2016  . S/P CABG x 4 04/22/2016  . Mixed hyperlipidemia 01/31/2013  . PAD (peripheral artery disease) ([place]) 01/31/2013  . CAD (coronary artery disease) 12/07/2012  . S/P angioplasty with stent-'02, '08. Low risk Myoview 2011 12/07/2012  . Diabetes mellitus ([place]) 12/07/2012  . Dyslipidemia 12/07/2012  . Family history of coronary artery disease 12/07/2012  . Chest pain with moderate risk for cardiac etiology 12/07/2012   HPI: Jonathan Shepard is sent for consultation regarding three-vessel coronary disease.  Jonathan Shepard is a 65 year old man with a past medical history significant for mixed hyperlipidemia, hypertension, and type 2 diabetes. He has a strong family history of coronary disease with both his father and brother having had CABG. He has a history of coronary disease dating back to 2002 when he had stents placed. He had additional stenting done in 2008.  He recently saw [name] with a complaint of throat discomfort with exertion. He recommended cardiac catheterization which was done yesterday by Dr. Martinique. It showed severe three-vessel disease with a 70% LAD stenosis, 90% stenosis of the left circumflex, and a totally occluded right coronary. Ejection fraction was approximately 50%.  He tells me that he has discomfort in his upper chest with exertion. If he continues to exert himself but radiates to both arms. He has been stopping his activity when he  first feels the discomfort so he has not had any arm pain recently. He did have upper chest discomfort walking into the office building this morning. He says this is not a pressure, tightness or pain. He sometimes experiences it after eating. He had been on medication for reflux but he stopped that and didn't notice any difference so he no longer takes that medicine.  The patient was electively for coronary artery bypass grafting.   Discharged Condition: good  Hospital Course: Patient was admitted electively and taken to the operating room on 04/22/2016 at which time he underwent the below described procedure. He tolerated well was taken to the surgical intensive care unit in stable condition  Postoperative hospital course: Overall the patient's progress quite nicely. He has maintained stable hemodynamics and normal sinus rhythm. He does have some postoperative volume overload but is responding to diuretics. Blood glucose has been under adequate control and he is being transition to his oral regimen. All routine lines, monitors and drainage devices have been discontinued in the standard fashion. He does have an expected acute blood loss anemia which is stable. Renal function is within normal limits. Incisions are noted be healing well without evidence of infection. He is tolerating diet.  He is experiencing sinus tachycardia.  His lopressor was titrated as tolerated.  His temporary pacing wires were removed without difficulty on 11/19. Oxygen has been weaned and he maintains good saturations on room air. He is on Imdur for his radial artery graft. Per Dr. Roxan Hockey, patient is felt surgically stable for discharge today.  Consults: None  Significant Diagnostic Studies: Routine postop serial chest x-rays and labs.  Treatments: surgery:  OPERATIVE REPORT   PREOPERATIVE DIAGNOSIS:  Three-vessel coronary artery disease.  POSTOPERATIVE DIAGNOSIS:  Three-vessel coronary artery  disease.  PROCEDURE:  Median sternotomy, extracorporeal circulation, coronary artery bypass grafting x4 (left internal mammary artery to LAD, left radial artery to OM2, saphenous vein graft to 1st diagonal, saphenous vein graft to posterior descending), endoscopic vein harvest, right thigh.  SURGEON:  Revonda Standard. Roxan Hockey, MD.  Terrence DupontDub Mikes.  SECOND ASSISTANT:  Jonathan Giovanni, PA-C.  ANESTHESIA:  General.  Discharge Exam: Blood pressure 124/79, pulse 95, temperature 98.3 F (36.8 C), temperature source Oral, resp. rate 18, height 6' (1.829 m), weight 221 lb 9.6 oz (100.5 kg), SpO2 97 %.  Cardiovascular: RRR Pulmonary: Slightly diminished at bases Abdomen: Soft, non tender, bowel sounds present. Extremities: Mild bilateral lower extremity edema. Ecchymosis left forearm (radial artery harvest). Wounds: Clean and dry.  No erythema or signs of infection.  Disposition: 01-Home or Self Care     Medication List    STOP taking these medications   diclofenac 75 MG EC tablet Commonly known as:  VOLTAREN   HYDROcodone-acetaminophen 10-325 MG tablet Commonly known as:  NORCO   ibuprofen 200 MG tablet Commonly known as:  ADVIL,MOTRIN   isosorbide dinitrate 10 MG tablet Commonly known as:  ISORDIL   nitroGLYCERIN 0.4 MG SL tablet Commonly known as:  NITROSTAT     TAKE these medications   aspirin EC 81 MG tablet Take 81 mg by mouth at bedtime.   clopidogrel 75 MG tablet Commonly known as:  PLAVIX TAKE 1 TABLET DAILY (NEEDS APPOINTMENT FOR REFILLS.)   CO Q10 PO Take 30 mg by mouth daily.   Fenofibric Acid 45 MG Cpdr TAKE 1 CAPSULE ONE TIME DAILY What changed:  See the new instructions.   Fish Oil 1200 MG Caps Take 1 capsule by mouth 2 (two) times daily.   furosemide 40 MG tablet Commonly known as:  LASIX Take 1 tablet (40 mg total) by mouth daily. For 4 days then stop.   glimepiride 4 MG tablet Commonly known as:  AMARYL Take 4 mg by mouth 2  (two) times daily.   isosorbide mononitrate 30 MG 24 hr tablet Commonly known as:  IMDUR Take 1 tablet (30 mg total) by mouth daily. For one month then stop.   metFORMIN 1000 MG tablet Commonly known as:  GLUCOPHAGE Take 1,000 mg by mouth 2 (two) times daily with a meal.   metoprolol succinate 50 MG 24 hr tablet Commonly known as:  TOPROL-XL Take 1 tablet (50 mg total) by mouth daily. What changed:  See the new instructions.   multivitamin tablet Take 1 tablet by mouth daily.   oxyCODONE 5 MG immediate release tablet Commonly known as:  Oxy IR/ROXICODONE Take 5 mg by mouth every 4-6 hours PRN severe pain.   potassium chloride SA 20 MEQ tablet Commonly known as:  K-DUR,KLOR-CON Take 1 tablet (20 mEq total) by mouth daily. For 4 days then stop.   pravastatin 80 MG tablet Commonly known as:  PRAVACHOL TAKE 1 TABLET EVERY DAY What changed:  See the new instructions.   ramipril 5 MG capsule Commonly known as:  ALTACE Take 1 capsule (5 mg total) by mouth daily. What changed:  See the new instructions.      The patient has been discharged on:   1.Beta Blocker:  Yes [ y  ]  No   [   ]                              If No, reason:  2.Ace Inhibitor/ARB: Yes [ y  ]                                     No  [    ]                                     If No, reason:  3.Statin:   Yes [ y  ]                  No  [   ]                  If No, reason:  4.Ecasa:  Yes  [ y  ]                  No   [   ]                  If No, reason:    Follow-up Information    Melrose Nakayama, MD Follow up.   Specialty:  Cardiothoracic Surgery Why:  Appointment to see Dr. Roxan Hockey on 06/02/2016 at 9:45 AM. He's obtain a chest x-ray at 9:15 AM at [place]. [facility] is located in the same office complex. Contact information: [place] [address] [phone]        Rosaria Ferries, PA-C Follow up.    Specialties:  Cardiology, Radiology Why:  You will follow up at [facility] on 05/08/16 at 8:30am. Suanne Marker is one of the PAs that works with Dr. Victorino December team. Contact information: [address] [place] [address] [phone]        [name]., MD Follow up.   Specialty:  Internal Medicine Why:  Call for a follow up appointment regarding further diabetes management and surveillance of HGA1C 7.6 Contact information: [address] [place] [employer] [id number] [phone]           Signed: Nani Skillern PA-C 04/27/2016, 11:46 AM

## 2016-04-24 NOTE — Discharge Instructions (Signed)
Endoscopic Saphenous Vein Harvesting, Care After Introduction Refer to this sheet in the next few weeks. These instructions provide you with information about caring for yourself after your procedure. Your health care provider may also give you more specific instructions. Your treatment has been planned according to current medical practices, but problems sometimes occur. Call your health care provider if you have any problems or questions after your procedure. What can I expect after the procedure? After the procedure, it is common to have:  Pain.  Bruising.  Swelling.  Numbness. Follow these instructions at home: Medicine  Take over-the-counter and prescription medicines only as told by your health care provider.  Do not drive or operate heavy machinery while taking prescription pain medicine. Incision care  Follow instructions from your health care provider about how to take care of the cut made during surgery (incision). Make sure you:  Wash your hands with soap and water before you change your bandage (dressing). If soap and water are not available, use hand sanitizer.  Change your dressing as told by your health care provider.  Leave stitches (sutures), skin glue, or adhesive strips in place. These skin closures may need to be in place for 2 weeks or longer. If adhesive strip edges start to loosen and curl up, you may trim the loose edges. Do not remove adhesive strips completely unless your health care provider tells you to do that.  Check your incision area every day for signs of infection. Check for:  More redness, swelling, or pain.  More fluid or blood.  Warmth.  Pus or a bad smell. General instructions  Raise (elevate) your legs above the level of your heart while you are sitting or lying down.  Do any exercises your health care providers have given you. These may include deep breathing, coughing, and walking exercises.  Do not shower, take baths, swim, or use  a hot tub unless told by your health care provider.  Wear your elastic stocking if told by your health care provider.  Keep all follow-up visits as told by your health care provider. This is important. Contact a health care provider if:  Medicine does not help your pain.  Your pain gets worse.  You have new leg bruises or your leg bruises get bigger.  You have a fever.  Your leg feels numb.  You have more redness, swelling, or pain around your incision.  You have more fluid or blood coming from your incision.  Your incision feels warm to the touch.  You have pus or a bad smell coming from your incision. Get help right away if:  Your pain is severe.  You develop pain, tenderness, warmth, redness, or swelling in any part of your leg.  You have chest pain.  You have trouble breathing. This information is not intended to replace advice given to you by your health care provider. Make sure you discuss any questions you have with your health care provider. Document Released: 02/04/2011 Document Revised: 10/31/2015 Document Reviewed: 04/08/2015  2017 Elsevier Coronary Artery Bypass Grafting Coronary artery bypass grafting (CABG) is a surgery that is done when arteries of the heart have become narrow or blocked with a fatty, waxy buildup. These arteries supply the heart with oxygen and nutrients it needs to pump blood to your body. During CABG, a section of blood vessel from another part of the body is taken and placed where there is narrowing or blocking. What happens before the procedure?  Take all medicines as told  by your doctor. You may be asked to start new medicines or stop others. Do not stop medicines or take different amounts on your own.  Do not eat or drink anything after midnight on the night before the surgery or as told by your doctor. Ask your doctor if it is okay to take a sip of water with any needed medicines. What happens during the procedure? There are two  ways of performing this surgery. Traditional open surgery  You will be given medicine to make you sleep through the surgery (general anesthetic).  Once you are sleeping, a cut (incision) is made down the front of the chest through your breastbone. Your breastbone is spread open so your heart can be seen.  You are then placed on a heart-lung bypass machine. This machine gives oxygen to your blood while the heart is being worked on.  Your heart is then stopped for a short amount of time. This is so Engineer, production can do the next steps.  Part of a leg vein may be taken and used to go around (bypass) the blocked heart arteries. Sometimes parts of an artery from inside your chest wall or from your arm are used.  When the bypass is done, you are taken off the machine.  Your heart is started again. It will start pumping as it once did.  The sac around your heart is closed.  Your chest is closed with stitches or staples.  You will have tubes in your chest. These tubes are connected to a suction device. This device will drain fluid and puff up your lungs again. Minimally invasive surgery  This surgery is done from a cut that is made on the left side of your chest. If needed, your surgeon may not have to slow or stop your heart. What happens after the procedure?  You will be taken to a recovery area to be watched.  You may wake up with a tube in your throat that helps you breathe. You may be connected to a breathing machine. You will not be able to talk when the tube is in. The tube is taken out when it is safe.  You may be groggy and have some pain. You will be given medicine to help the pain. This information is not intended to replace advice given to you by your health care provider. Make sure you discuss any questions you have with your health care provider. Document Released: 05/30/2013 Document Revised: 10/31/2015 Document Reviewed: 11/01/2012 Elsevier Interactive Patient Education  2017  [employer].

## 2016-04-24 NOTE — Progress Notes (Signed)
[facility] BRIEF SICU PROGRESS NOTE  2 Days Post-Op  S/P Procedure(s) (LRB): CORONARY ARTERY BYPASS GRAFTING (CABG)x4 with endoscopic harvesting of right saphenous vein SVG to diagonal SVG to PDA Left Radial artery to OM 2 LIMA to LAD (N/A) TRANSESOPHAGEAL ECHOCARDIOGRAM (TEE) (N/A) LEFT RADIAL ARTERY HARVEST (Left)   Stable day NSR - sinus tach w/ stable BP Breathing comfortably w/ O2 sats 92% on RA Diuresing fairly well  Plan: Continue current plan  Rexene Alberts, MD 04/24/2016 5:27 PM

## 2016-04-24 NOTE — Progress Notes (Signed)
Inpatient Diabetes Program Recommendations  AACE/ADA: New Consensus Statement on Inpatient Glycemic Control (2015)  Target Ranges:  Prepandial:   less than 140 mg/dL      Peak postprandial:   less than 180 mg/dL (1-2 hours)      Critically ill patients:  140 - 180 mg/dL   Lab Results  Component Value Date   GLUCAP 108 (H) 04/24/2016   HGBA1C 7.6 (H) 04/20/2016    Review of Glycemic Control Results for Jonathan Shepard, Jonathan Shepard (MRN [id number]) as of 04/24/2016 10:19  Ref. Range 04/24/2016 06:08 04/24/2016 06:59 04/24/2016 07:54 04/24/2016 09:21  Glucose-Capillary Latest Ref Range: 65 - 99 mg/dL 125 (H) 117 (H) 108 (H) 96   Diabetes history: DM2 Outpatient Diabetes medications: Amaryl 4 bid+Metformin 1 gm bid Current orders for Inpatient glycemic control: Levemir 20 units bid+Novolog 4 units tid meal coverage+Novolog correction 0-20 units tid+0-5 units hs+Amaryl 4 mg bid+Metformin 1 gm bid  Inpatient Diabetes Program Recommendations:  Noted A1c 7.6 and transitioned off of IV insulin. Please review CBGs today. May not need Novolog meal coverage since started on Amaryl 4 mg bid.  Thank you, Nani Gasser. Daegen Berrocal, RN, MSN, CDE Inpatient Glycemic Control Team Team Pager [phone] (8am-5pm) 04/24/2016 10:23 AM

## 2016-04-25 ENCOUNTER — Inpatient Hospital Stay (HOSPITAL_COMMUNITY): Payer: Commercial Managed Care - HMO

## 2016-04-25 DIAGNOSIS — Z951 Presence of aortocoronary bypass graft: Secondary | ICD-10-CM

## 2016-04-25 LAB — CBC
HCT: 27.3 % — ABNORMAL LOW (ref 39.0–52.0)
Hemoglobin: 9 g/dL — ABNORMAL LOW (ref 13.0–17.0)
MCH: 29.8 pg (ref 26.0–34.0)
MCHC: 33 g/dL (ref 30.0–36.0)
MCV: 90.4 fL (ref 78.0–100.0)
Platelets: 137 10*3/uL — ABNORMAL LOW (ref 150–400)
RBC: 3.02 MIL/uL — ABNORMAL LOW (ref 4.22–5.81)
RDW: 13.3 % (ref 11.5–15.5)
WBC: 13.9 10*3/uL — ABNORMAL HIGH (ref 4.0–10.5)

## 2016-04-25 LAB — GLUCOSE, CAPILLARY
GLUCOSE-CAPILLARY: 123 mg/dL — AB (ref 65–99)
GLUCOSE-CAPILLARY: 127 mg/dL — AB (ref 65–99)
GLUCOSE-CAPILLARY: 103 mg/dL — AB (ref 65–99)
Glucose-Capillary: 83 mg/dL (ref 65–99)

## 2016-04-25 LAB — BASIC METABOLIC PANEL
Anion gap: 9 (ref 5–15)
BUN: 31 mg/dL — AB (ref 6–20)
CHLORIDE: 102 mmol/L (ref 101–111)
CO2: 23 mmol/L (ref 22–32)
CREATININE: 1.2 mg/dL (ref 0.61–1.24)
Calcium: 8.8 mg/dL — ABNORMAL LOW (ref 8.9–10.3)
GFR calc Af Amer: >60 mL/min (ref >60)
GLUCOSE: 169 mg/dL — AB (ref 65–99)
Potassium: 4.9 mmol/L (ref 3.5–5.1)
SODIUM: 134 mmol/L — AB (ref 135–145)

## 2016-04-25 MED ORDER — ALUM & MAG HYDROXIDE-SIMETH 200-200-20 MG/5ML PO SUSP: 15.0000 mL | ORAL | Status: DC | PRN

## 2016-04-25 MED ORDER — ZOLPIDEM TARTRATE 5 MG PO TABS: 5.0000 mg | ORAL_TABLET | Freq: Every evening | ORAL | Status: DC | PRN

## 2016-04-25 MED ORDER — SODIUM CHLORIDE 0.9% FLUSH: 3.0000 mL | INTRAVENOUS | Status: DC | PRN

## 2016-04-25 MED ORDER — SODIUM CHLORIDE 0.9% FLUSH
3.0000 mL | Freq: Two times a day (BID) | INTRAVENOUS | Status: DC
  Administered 2016-04-25 – 2016-04-27 (×4): 3 mL via INTRAVENOUS

## 2016-04-25 MED ORDER — MAGNESIUM HYDROXIDE 400 MG/5ML PO SUSP
30.0000 mL | Freq: Every day | ORAL | Status: DC | PRN
  Filled 2016-04-25: qty 30

## 2016-04-25 MED ORDER — MOVING RIGHT ALONG BOOK
Freq: Once | Status: AC
  Administered 2016-04-25: 1
  Filled 2016-04-25: qty 1

## 2016-04-25 MED ORDER — SODIUM CHLORIDE 0.9 % IV SOLN: 250.0000 mL | INTRAVENOUS | Status: DC | PRN

## 2016-04-25 NOTE — Addendum Note (Signed)
Addendum  created 04/25/16 1947 by Roberts Gaudy, MD   Sign clinical note

## 2016-04-25 NOTE — Addendum Note (Signed)
Addendum  created 04/25/16 1913 by Roberts Gaudy, MD   Pend clinical note

## 2016-04-25 NOTE — Progress Notes (Signed)
3 Days Post-Op Procedure(s) (LRB): CORONARY ARTERY BYPASS GRAFTING (CABG)x4 with endoscopic harvesting of right saphenous vein SVG to diagonal SVG to PDA Left Radial artery to OM 2 LIMA to LAD (N/A) TRANSESOPHAGEAL ECHOCARDIOGRAM (TEE) (N/A) LEFT RADIAL ARTERY HARVEST (Left) Subjective: Feels better today. Still having significant pain but not as bad as it was Denies nausea  Objective: Vital signs in last 24 hours: Temp:  [98.3 F (36.8 C)-99.1 F (37.3 C)] 98.3 F (36.8 C) (11/18 0843) Pulse Rate:  [82-115] 90 (11/18 0700) Cardiac Rhythm: Normal sinus rhythm (11/18 0400) Resp:  [11-21] 12 (11/18 0700) BP: (104-163)/(71-98) 128/74 (11/18 0600) SpO2:  [90 %-96 %] 91 % (11/18 0700) Arterial Line BP: (123-154)/(65-72) 123/65 (11/17 1100) Weight:  [227 lb 1.2 oz (103 kg)] 227 lb 1.2 oz (103 kg) (11/18 0417)  Hemodynamic parameters for last 24 hours:    Intake/Output from previous day: 11/17 0701 - 11/18 0700 In: 943.3 [P.O.:800; I.V.:93.3; IV Piggyback:50] Out: 2100 [Urine:2100] Intake/Output this shift: No intake/output data recorded.  General appearance: alert, cooperative and no distress Neurologic: intact Heart: regular rate and rhythm Lungs: diminished breath sounds bibasilar Abdomen: mildly distended, BS + Wound: clean and dry  Lab Results:  Recent Labs  04/24/16 0421 04/25/16 0242  WBC 13.6* 13.9*  HGB 9.8* 9.0*  HCT 30.0* 27.3*  PLT 136* 137*   BMET:  Recent Labs  04/24/16 0421 04/25/16 0242  NA 136 134*  K 4.2 4.9  CL 106 102  CO2 22 23  GLUCOSE 106* 169*  BUN 16 31*  CREATININE 0.92 1.20  CALCIUM 8.8* 8.8*    PT/INR:  Recent Labs  04/23/16 0008  LABPROT 16.8*  INR 1.35   ABG    Component Value Date/Time   PHART 7.358 04/23/2016 0603   HCO3 21.9 04/23/2016 0603   TCO2 22 04/23/2016 1650   ACIDBASEDEF 3.0 (H) 04/23/2016 0603   O2SAT 90.0 04/23/2016 0603   CBG (last 3)   Recent Labs  04/24/16 1058 04/24/16 1718 04/24/16 2141   GLUCAP 105* 204* 151*    Assessment/Plan: S/P Procedure(s) (LRB): CORONARY ARTERY BYPASS GRAFTING (CABG)x4 with endoscopic harvesting of right saphenous vein SVG to diagonal SVG to PDA Left Radial artery to OM 2 LIMA to LAD (N/A) TRANSESOPHAGEAL ECHOCARDIOGRAM (TEE) (N/A) LEFT RADIAL ARTERY HARVEST (Left) Plan for transfer to step-down: see transfer orders  CV- POD # 3  CABG  On ASA, plavix, statin, beta blocker and ACE-I  RESP- bibasilar atelectasis improved- continue IS, flutter  RENAL- creatinine and lytes OK  ENDO-  CBG reasonably well controlled  Anemia secondary to ABL- mild, follow  Continue cardiac rehab  transfer to [facility]   LOS: 3 days    Melrose Nakayama 04/25/2016

## 2016-04-25 NOTE — Progress Notes (Signed)
Anesthesiology Follow-up:  Awake and alert, in good spirits, minimal pain, moved to 2W today.   VS: T- 36.7 BP- 144/78 HR- 104 RR 18 O2 Sat 95% on RA  K- 4.9 Na- 134 BUN/Cr. 31/1.20 glucose- 171 H/H- 9/0/27 Platelets- 137,000  Extubated 5 hours post-op. Doing well, no apparent complications.  Roberts Gaudy

## 2016-04-25 NOTE — Progress Notes (Signed)
Patient arrived the unit from 2S assessment completed see flowsheet, placed on tele ccmd notified, patient oriented to room and staff, bed in lowest position, call light within reach, will continue to monitor

## 2016-04-26 LAB — BASIC METABOLIC PANEL
ANION GAP: 9 (ref 5–15)
BUN: 33 mg/dL — ABNORMAL HIGH (ref 6–20)
CO2: 22 mmol/L (ref 22–32)
Calcium: 9 mg/dL (ref 8.9–10.3)
Chloride: 105 mmol/L (ref 101–111)
Creatinine, Ser: 1.16 mg/dL (ref 0.61–1.24)
GFR calc Af Amer: >60 mL/min (ref >60)
GFR calc non Af Amer: >60 mL/min (ref >60)
GLUCOSE: 70 mg/dL (ref 65–99)
POTASSIUM: 4.2 mmol/L (ref 3.5–5.1)
Sodium: 136 mmol/L (ref 135–145)

## 2016-04-26 LAB — GLUCOSE, CAPILLARY
GLUCOSE-CAPILLARY: 150 mg/dL — AB (ref 65–99)
GLUCOSE-CAPILLARY: 102 mg/dL — AB (ref 65–99)
GLUCOSE-CAPILLARY: 129 mg/dL — AB (ref 65–99)
Glucose-Capillary: 65 mg/dL (ref 65–99)
Glucose-Capillary: 93 mg/dL (ref 65–99)

## 2016-04-26 LAB — CBC
HEMATOCRIT: 26 % — AB (ref 39.0–52.0)
Hemoglobin: 8.6 g/dL — ABNORMAL LOW (ref 13.0–17.0)
MCH: 29.9 pg (ref 26.0–34.0)
MCHC: 33.1 g/dL (ref 30.0–36.0)
MCV: 90.3 fL (ref 78.0–100.0)
Platelets: 181 10*3/uL (ref 150–400)
RBC: 2.88 MIL/uL — ABNORMAL LOW (ref 4.22–5.81)
RDW: 13.4 % (ref 11.5–15.5)
WBC: 11.3 10*3/uL — AB (ref 4.0–10.5)

## 2016-04-26 MED ORDER — RAMIPRIL 5 MG PO CAPS
5.0000 mg | ORAL_CAPSULE | Freq: Every day | ORAL | Status: DC
  Administered 2016-04-26 – 2016-04-27 (×2): 5 mg via ORAL
  Filled 2016-04-26 (×2): qty 1

## 2016-04-26 MED ORDER — LACTULOSE 10 GM/15ML PO SOLN
20.0000 g | Freq: Two times a day (BID) | ORAL | Status: DC | PRN
  Administered 2016-04-26: 20 g via ORAL
  Filled 2016-04-26: qty 30

## 2016-04-26 NOTE — Progress Notes (Signed)
Patient education competed fro removal of pacer wire, he verbalised understanding, 4 pacer wires removed as ordered, patient now on bedrest, bed In lowest position, call light within reach will continue to monitor

## 2016-04-26 NOTE — Progress Notes (Addendum)
[address]       [place] [address]             [phone]      4 Days Post-Op Procedure(s) (LRB): CORONARY ARTERY BYPASS GRAFTING (CABG)x4 with endoscopic harvesting of right saphenous vein SVG to diagonal SVG to PDA Left Radial artery to OM 2 LIMA to LAD (N/A) TRANSESOPHAGEAL ECHOCARDIOGRAM (TEE) (N/A) LEFT RADIAL ARTERY HARVEST (Left)   Subjective:  Mr. Bungert has no new complaints.  He states he is doing okay.  He did get a little winded during his walk.  Also states his arm is pretty bruised.  He has not moved his bowels yet.  Objective: Vital signs in last 24 hours: Temp:  [97.7 F (36.5 C)-98.8 F (37.1 C)] 97.7 F (36.5 C) (11/19 0520) Pulse Rate:  [97-116] 97 (11/19 0520) Cardiac Rhythm: Sinus tachycardia (11/18 2210) Resp:  [14-18] 18 (11/19 0520) BP: (117-147)/(68-91) 125/72 (11/19 0520) SpO2:  [93 %-95 %] 95 % (11/19 0520) Weight:  [223 lb (101.2 kg)] 223 lb (101.2 kg) (11/19 0520)  Intake/Output from previous day: 11/18 0701 - 11/19 0700 In: 240 [P.O.:240] Out: 525 [Urine:525] Intake/Output this shift: Total I/O In: 240 [P.O.:240] Out: -   General appearance: alert, cooperative and no distress Heart: regular rate and rhythm and tachy Lungs: clear to auscultation bilaterally Abdomen: soft, non-tender; bowel sounds normal; no masses,  no organomegaly Extremities: edema trace Wound: clean and dry, significant ecchymosis of at radial artery harvest site on left arm  Lab Results:  Recent Labs  04/25/16 0242 04/26/16 0424  WBC 13.9* 11.3*  HGB 9.0* 8.6*  HCT 27.3* 26.0*  PLT 137* 181   BMET:  Recent Labs  04/25/16 0242 04/26/16 0424  NA 134* 136  K 4.9 4.2  CL 102 105  CO2 23 22  GLUCOSE 169* 70  BUN 31* 33*  CREATININE 1.20 1.16  CALCIUM 8.8* 9.0    PT/INR: No results for input(s): LABPROT, INR in the last 72 hours. ABG    Component Value Date/Time   PHART 7.358 04/23/2016 0603   HCO3 21.9 04/23/2016 0603     TCO2 22 04/23/2016 1650   ACIDBASEDEF 3.0 (H) 04/23/2016 0603   O2SAT 90.0 04/23/2016 0603   CBG (last 3)   Recent Labs  04/25/16 2218 04/26/16 0639 04/26/16 0701  GLUCAP 83 65 93    Assessment/Plan: S/P Procedure(s) (LRB): CORONARY ARTERY BYPASS GRAFTING (CABG)x4 with endoscopic harvesting of right saphenous vein SVG to diagonal SVG to PDA Left Radial artery to OM 2 LIMA to LAD (N/A) TRANSESOPHAGEAL ECHOCARDIOGRAM (TEE) (N/A) LEFT RADIAL ARTERY HARVEST (Left)  1. CV- Sinus Tachycardia, BP elevated as high as 140s at times- will continue Lopressor at current dose for now, increase Altace, and continue Imdur for radial graft....elevated HR could be due to mild dehydration as patients weight is at baseline and his I/O have been negative  2. Pulm- off oxygen, continue IS 3. Renal- creatinine improved, down to 1.16, weight is at baseline, minimal LE edema.. Will give one final dose of lasix today and then discontinue 4. Expected post operative blood loss anemia- Hgb down to 8.6 5. DM- on home medications, hypoglycemic this morning, will d/c Levemir 6. GI- constipation, will add lactulose prn 7. Dispo- patient stable, tachycardic this morning likely due to mild dehydration will monitor if needed can increase Lopressor, will d/c EPW, possibly home in AM if remains stable   LOS: 4 days  Ellwood Handler 04/26/2016  I have seen and examined the patient and agree with the assessment and plan as outlined.  More discomfort in chest related to sternotomy today.  Moving slow but possibly ready for d/c home 1-2 days   Rexene Alberts, MD 04/26/2016 10:44 AM

## 2016-04-27 LAB — GLUCOSE, CAPILLARY
Glucose-Capillary: 136 mg/dL — ABNORMAL HIGH (ref 65–99)
Glucose-Capillary: 135 mg/dL — ABNORMAL HIGH (ref 65–99)

## 2016-04-27 MED ORDER — OXYCODONE HCL 5 MG PO TABS: ORAL_TABLET | ORAL | 0 refills | Status: DC

## 2016-04-27 MED ORDER — POTASSIUM CHLORIDE CRYS ER 20 MEQ PO TBCR: 20.0000 meq | EXTENDED_RELEASE_TABLET | Freq: Every day | ORAL | 0 refills | Status: DC

## 2016-04-27 MED ORDER — METOPROLOL SUCCINATE ER 50 MG PO TB24: 50.0000 mg | ORAL_TABLET | Freq: Every day | ORAL | 1 refills | Status: DC

## 2016-04-27 MED ORDER — POTASSIUM CHLORIDE CRYS ER 20 MEQ PO TBCR
20.0000 meq | EXTENDED_RELEASE_TABLET | Freq: Every day | ORAL | 0 refills | Status: DC
Start: 2016-04-27 — End: 2016-04-27

## 2016-04-27 MED ORDER — RAMIPRIL 5 MG PO CAPS: 5.0000 mg | ORAL_CAPSULE | Freq: Every day | ORAL | 1 refills | Status: DC

## 2016-04-27 MED ORDER — ISOSORBIDE MONONITRATE ER 30 MG PO TB24
30.0000 mg | ORAL_TABLET | Freq: Every day | ORAL | 0 refills | Status: DC
Start: 2016-04-27 — End: 2017-08-04

## 2016-04-27 MED ORDER — FUROSEMIDE 40 MG PO TABS: 40.0000 mg | ORAL_TABLET | Freq: Every day | ORAL | 0 refills | Status: DC

## 2016-04-27 NOTE — Progress Notes (Signed)
Removed sutures pt. Tolerated well

## 2016-04-27 NOTE — Progress Notes (Signed)
CARDIAC REHAB PHASE I   PRE:  Rate/Rhythm: 93 SR  BP:  Sitting: 128/73        SaO2: 95 RA  MODE:  Ambulation: 550 ft   POST:  Rate/Rhythm: 123 ST  BP:  Sitting: 157/82         SaO2: 95 RA  Pt ambulated 550 ft on RA, hand held assist, steady gait, tolerated well. Pt c/o mild DOE, states it is improving, denies any other complaints, declined rest stop. Cardiac surgery discharge education completed with pt and wife at bedside. Reviewed IS, sternal precautions, activity progression, exercise, heart healthy diet, carb counting, daily weights and phase 2 cardiac rehab. Pt verbalized understanding. Pt agrees to phase 2 cardiac rehab referral, will send to [facility] per pt request. Pt to recliner after walk, call bell within reach.  [place], RN, BSN 04/27/2016 11:55 AM

## 2016-04-27 NOTE — Progress Notes (Addendum)
      [address]       [place], [address]             [phone]        5 Days Post-Op Procedure(s) (LRB): CORONARY ARTERY BYPASS GRAFTING (CABG)x4 with endoscopic harvesting of right saphenous vein SVG to diagonal SVG to PDA Left Radial artery to OM 2 LIMA to LAD (N/A) TRANSESOPHAGEAL ECHOCARDIOGRAM (TEE) (N/A) LEFT RADIAL ARTERY HARVEST (Left)  Subjective: Patient had several bowel movements after laxative. Sometimes is "short winded"and has some numbness lateral left forearm incision.  Objective: Vital signs in last 24 hours: Temp:  [97.5 F (36.4 C)-99.5 F (37.5 C)] 98.3 F (36.8 C) (11/20 0320) Pulse Rate:  [95-109] 95 (11/20 0320) Cardiac Rhythm: Sinus tachycardia (11/19 1900) Resp:  [18-20] 18 (11/20 0320) BP: (113-156)/(62-82) 133/82 (11/20 0320) SpO2:  [92 %-97 %] 97 % (11/20 0320) Weight:  [221 lb 9.6 oz (100.5 kg)] 221 lb 9.6 oz (100.5 kg) (11/20 0337)  Pre op weight 101 kg Current Weight  04/27/16 221 lb 9.6 oz (100.5 kg)      Intake/Output from previous day: 11/19 0701 - 11/20 0700 In: 720 [P.O.:720] Out: -    Physical Exam:  Cardiovascular: RRR Pulmonary: Slightly diminished at bases Abdomen: Soft, non tender, bowel sounds present. Extremities: Mild bilateral lower extremity edema. Ecchymosis left forearm (radial artery harvest). Wounds: Clean and dry.  No erythema or signs of infection.  Lab Results: CBC: Recent Labs  04/25/16 0242 04/26/16 0424  WBC 13.9* 11.3*  HGB 9.0* 8.6*  HCT 27.3* 26.0*  PLT 137* 181   BMET:  Recent Labs  04/25/16 0242 04/26/16 0424  NA 134* 136  K 4.9 4.2  CL 102 105  CO2 23 22  GLUCOSE 169* 70  BUN 31* 33*  CREATININE 1.20 1.16  CALCIUM 8.8* 9.0    PT/INR:  Lab Results  Component Value Date   INR 1.35 04/23/2016   INR 0.99 04/20/2016   INR 1.0 04/09/2016   ABG:  INR: Will add last result for INR, ABG once components are confirmed Will add last 4 CBG results once  components are confirmed  Assessment/Plan:  1. CV - Occasional ST in the low 100's. SR in the 80's this am. On Lopressor 25 mg bid, Plavix 75 mg daily, Imdur 30 mg daily, and Ramipril 5 mg daily. 2.  Pulmonary - On room air. Encourage incentive spirometer. 3. Volume Overload - On Lasix 40 mg daily. Will continue for several days after discharge 4.  Acute blood loss anemia - H and H yesterday 8.6 and 26 5. DM-CBGs 102/129/136. On Metformin 1000 mg bid, Glimepiride 4 mg bid, and Insulin with meals. Pre op HGA1C 7.6. Will need follow up with medical doctor after discharge. 6. Regarding numbness left forearm, likely related to cutaneous nerve. Hopefully, will improve with time. 7. Discharge (after seen by Dr. Roxan Hockey)  Jonathan Shepard MPA-C 04/27/2016,7:21 AM Patient seen and examined, agree with above Home today  Remo Lipps C. Roxan Hockey, MD [facility] [phone]

## 2016-04-27 NOTE — Care Management Important Message (Signed)
Important Message  Patient Details  Name: Jonathan Shepard MRN: [id number] Date of Birth: 03-Dec-1950   Medicare Important Message Given:  Yes    Nathen May 04/27/2016, 10:36 AM

## 2016-04-27 NOTE — Progress Notes (Signed)
Pt. Discharged to home with wife Pt. D/C'd via wheelchair with volunteers Discharge information reviewed and given All personal belongings given to Pt.  Education discussed with teach back  IV was d/c Tele d/c

## 2016-04-28 NOTE — Consult Note (Signed)
            [facility] Primary Care Navigator  04/28/2016  AYDREN REGISTER November 24, 1950 [id number]   Wentto see patient to identify possible discharge needs butpatient was already discharged.  Primary care provider's office contacted(Jennifer)to notify of patient's discharge and need for post hospital follow-up and transition of care.  Made aware to refer patient to [facility] care management for care coordination needs and further disease management if deemed appropriate.  For additional questions please contact:  Edwena Felty A. Patricia Fargo, BSN, RN-BC [facility] PRIMARY CARE Navigator Cell: [phone]

## 2016-05-08 ENCOUNTER — Ambulatory Visit (INDEPENDENT_AMBULATORY_CARE_PROVIDER_SITE_OTHER): Payer: Commercial Managed Care - HMO | Admitting: Physician Assistant

## 2016-05-08 ENCOUNTER — Encounter: Payer: Self-pay | Admitting: Physician Assistant

## 2016-05-08 ENCOUNTER — Encounter: Payer: Self-pay | Admitting: Thoracic Surgery (Cardiothoracic Vascular Surgery)

## 2016-05-08 VITALS — BP 130/70 | HR 102 | Ht 72.0 in | Wt 206.8 lb

## 2016-05-08 DIAGNOSIS — I10 Essential (primary) hypertension: Secondary | ICD-10-CM

## 2016-05-08 DIAGNOSIS — Z951 Presence of aortocoronary bypass graft: Secondary | ICD-10-CM | POA: Diagnosis not present

## 2016-05-08 DIAGNOSIS — E782 Mixed hyperlipidemia: Secondary | ICD-10-CM

## 2016-05-08 MED ORDER — OXYCODONE HCL 5 MG PO TABS: ORAL_TABLET | ORAL | 0 refills | Status: DC

## 2016-05-08 NOTE — Progress Notes (Signed)
Thanks, Leonia Corona

## 2016-05-08 NOTE — Patient Instructions (Signed)
Medication Instructions:  Your physician has recommended you make the following change in your medication:  1) START Oxycodone 5mg  daily at bedtime as needed for pain. You have been given an Rx 2) START Benadryl 20mg  daily at bedtime as needed.   Labwork: None ordered  Testing/Procedures: None ordered  Follow-Up: Your physician wants you to follow-up in: 3 months with Dr.Crotoiru  You will receive a reminder letter in the mail two months in advance. If you don't receive a letter, please call our office to schedule the follow-up appointment.   Any Other Special Instructions Will Be Listed Below (If Applicable). Your physician recommends that you weigh, daily, at the same time every day, and in the same amount of clothing. Please record your daily weights on the handout provided and bring it to your next appointment.   Your physician has requested that you regularly monitor and record your blood pressure and heart rate readings at home. Please use the same machine at the same time of day to check your readings and record them. Let us know if your heart rate and blood pressure are consistently elevated.  Limit sodium in your diet       If you need a refill on your cardiac medications before your next appointment, please call your pharmacy.

## 2016-05-08 NOTE — Progress Notes (Signed)
Cardiology Office Note   Date:  05/08/2016   ID:  Jonathan Shepard, DOB [birth year], MRN [id number]  PCP:  Jonathan Shepard., MD  Cardiologist:  Dr Juanetta Snow, PA-C   Chief Complaint  Patient presents with  . Follow-up    seen for Dr. [name]    History of Present Illness: Jonathan Shepard is a 65 y.o. male with a history of DM, HTN, HLD, obesity, FH CAD, stents RCA 2002 & 2008, PTCA RCA & stent LAD 05/2007, PAD w/ R ant tibial 100% w/ no R DP pulse but also has nl ABIs  Admit 11/15-11/20 for CP>>cath>>CABG w/ LIMA-LAD, SVG-Diag, SVG-PDA, L-RAD-OM2  [name] presents for post-hospital f/u  He is doing well since d/c. He takes pain meds at bedtime, uses Tylenol the rest of the time. He is walking, is up to 10 min at a time. He has not had palpitations.   He wants to drive. He has rental properties and not driving is very inconvenient.   No LE edema. No orthopnea or PND. He wakes up frequently during the night due to being uncomfortable. He feels he gets a little more rest in the recliner, but this is due to MS discomfort not respiratory issues.   He is almost out of pain pills, wonders if we can help. He does not see the surgeons till after Christmas. He also has Nocturia.   He has been tracking his weight at home, it was 199 today. He is using the incentive spirometer, got up to 1750 cc last pm.    Past Medical History:  Diagnosis Date  . CAD (coronary artery disease)   . Diabetes ([place])   . Dyspnea   . Hyperlipidemia   . Hypertension   . Obesity     Past Surgical History:  Procedure Laterality Date  . CARDIAC CATHETERIZATION N/A 04/14/2016   Procedure: Left Heart Cath and Coronary Angiography;  Surgeon: Jonathan M Martinique, MD;  Location: [facility];  Service: Cardiovascular;  Laterality: N/A;  . CORONARY ARTERY BYPASS GRAFT N/A 04/22/2016   Procedure: CORONARY ARTERY BYPASS GRAFTING (CABG)x4 with endoscopic harvesting of right  saphenous vein SVG to diagonal SVG to PDA Left Radial artery to OM 2 LIMA to LAD;  Surgeon: Jonathan Nakayama, MD;  Location: [facility];  Service: Open Heart Surgery;  Laterality: N/A;  . RADIAL ARTERY HARVEST Left 04/22/2016   Procedure: LEFT RADIAL ARTERY HARVEST;  Surgeon: Jonathan Nakayama, MD;  Location: [facility];  Service: Open Heart Surgery;  Laterality: Left;  . TEE WITHOUT CARDIOVERSION N/A 04/22/2016   Procedure: TRANSESOPHAGEAL ECHOCARDIOGRAM (TEE);  Surgeon: Jonathan Nakayama, MD;  Location: [facility];  Service: Open Heart Surgery;  Laterality: N/A;    Current Outpatient Prescriptions  Medication Sig Dispense Refill  . aspirin EC 81 MG tablet Take 81 mg by mouth at bedtime.     . Choline Fenofibrate (FENOFIBRIC ACID) 45 MG CPDR TAKE 1 CAPSULE ONE TIME DAILY (Patient taking differently: take 45mg s at bedtime) 90 capsule 2  . clopidogrel (PLAVIX) 75 MG tablet TAKE 1 TABLET DAILY (NEEDS APPOINTMENT FOR REFILLS.) 90 tablet 2  . Coenzyme Q10 (CO Q10 PO) Take 30 mg by mouth daily.     Marland Kitchen glimepiride (AMARYL) 4 MG tablet Take 4 mg by mouth 2 (two) times daily.     . isosorbide mononitrate (IMDUR) 30 MG 24 hr tablet Take 1 tablet (30 mg total) by mouth daily. For one month then  stop. 30 tablet 0  . metFORMIN (GLUCOPHAGE) 1000 MG tablet Take 1,000 mg by mouth 2 (two) times daily with a meal.    . metoprolol succinate (TOPROL-XL) 50 MG 24 hr tablet Take 1 tablet (50 mg total) by mouth daily. 30 tablet 1  . Multiple Vitamin (MULTIVITAMIN) tablet Take 1 tablet by mouth daily.    . Omega-3 Fatty Acids (FISH OIL) 1200 MG CAPS Take 1 capsule by mouth 2 (two) times daily.     Marland Kitchen oxyCODONE (OXY IR/ROXICODONE) 5 MG immediate release tablet Take 5 mg by mouth every 4-6 hours PRN severe pain. 28 tablet 0  . pravastatin (PRAVACHOL) 80 MG tablet TAKE 1 TABLET EVERY DAY (Patient taking differently: take 80mg s at bedtime) 90 tablet 2  . ramipril (ALTACE) 5 MG capsule Take 1 capsule (5 mg total) by mouth daily.  30 capsule 1   No current facility-administered medications for this visit.     Allergies:   Patient has no known allergies.    Social History:  The patient  reports that he has never smoked. He has never used smokeless tobacco. He reports that he does not drink alcohol or use drugs.   Family History:  The patient's family history includes Coronary artery disease (age of onset: 55) in his brother; Coronary artery disease (age of onset: 50) in his father.    ROS:  Please see the history of present illness. All other systems are reviewed and negative.    PHYSICAL EXAM: VS:  BP 130/70   Pulse (!) 102   Ht 6' (1.829 m)   Wt 206 lb 12.8 oz (93.8 kg)   BMI 28.05 kg/m  , BMI Body mass index is 28.05 kg/m. GEN: Well nourished, well developed, male in no acute distress  HEENT: normal for age  Neck: no JVD, no carotid bruit, no masses Cardiac: RRR; no murmur, no rubs, or gallops Respiratory: decreased BS bases but clear to auscultation bilaterally, normal work of breathing GI: soft, nontender, nondistended, + BS MS: no deformity or atrophy; no edema; distal pulses are 2+ in all 4 extremities   Skin: warm and dry, no rash; all incisions are healing well, no signs of infection Neuro:  Strength and sensation are intact Psych: euthymic mood, full affect   EKG:  EKG is ordered today. The ekg ordered today demonstrates SR, mild diffuse T wave changes in multiple leads   Recent Labs: 04/09/2016: TSH 1.35 04/20/2016: ALT 68 04/23/2016: Magnesium 2.0 04/26/2016: BUN 33; Creatinine, Ser 1.16; Hemoglobin 8.6; Platelets 181; Potassium 4.2; Sodium 136    Lipid Panel    Component Value Date/Time   CHOL 158 03/14/2015 0705   TRIG 281 (H) 03/14/2015 0705   HDL 28 (L) 03/14/2015 0705   CHOLHDL 5.6 (H) 03/14/2015 0705   VLDL 56 (H) 03/14/2015 0705   LDLCALC 74 03/14/2015 0705     Wt Readings from Last 3 Encounters:  05/08/16 206 lb 12.8 oz (93.8 kg)  04/27/16 221 lb 9.6 oz (100.5 kg)    04/20/16 223 lb 14.4 oz (101.6 kg)     Other studies Reviewed: Additional studies/ records that were reviewed today include: office notes, hospital records and testing.  ASSESSMENT AND PLAN:  1.  CAD: He is recovering well post-CABG. Continue current therapy. He was given another rx for pain meds, if these run out, call the surgeons. No driving till cleared by [facility].  Continue current therapy with ASA, Plavix, Imdur (for 1 month), metoprolol, Altace. Follow up with the  surgeons, he is encouraged to attend cardiac rehab.  2. HTN: his BP is upper limit of normal today and his heart rate is elevated. He feels his BP is a little lower at home, will start tracking it better. If BP/HR are elevated at home, he will call.  Can increase Toprol XL from 50 mg daily to 50 mg am and 25 mg pm, if BP/HR are high at home.  3. Hyperlipidemia: continue Pravachol, Dr Gerarda Fraction follows this  4. Insomnia: part of the problem is pain, but he may benefit from a sleep aid. Ok to take Benadryl at bedtime as needed.   Current medicines are reviewed at length with the patient today.  The patient does not have concerns regarding medicines.  The following changes have been made:  no change  Labs/ tests ordered today include:  No orders of the defined types were placed in this encounter.   Disposition:   FU with Dr Jonathan Shepard  Signed, Rosaria Ferries, PA-C  05/08/2016 9:02 AM    [place] Phone: [phone]; Fax: [phone]  This note was written with the assistance of speech recognition software. Please excuse any transcriptional errors.

## 2016-05-18 ENCOUNTER — Other Ambulatory Visit: Payer: Self-pay | Admitting: Thoracic Surgery (Cardiothoracic Vascular Surgery)

## 2016-05-18 DIAGNOSIS — Z951 Presence of aortocoronary bypass graft: Secondary | ICD-10-CM

## 2016-05-19 ENCOUNTER — Ambulatory Visit (INDEPENDENT_AMBULATORY_CARE_PROVIDER_SITE_OTHER): Payer: Self-pay | Admitting: Physician Assistant

## 2016-05-19 ENCOUNTER — Ambulatory Visit
Admission: RE | Admit: 2016-05-19 | Discharge: 2016-05-19 | Disposition: A | Discharge status: Home / Self Care | Payer: Commercial Managed Care - HMO | Source: Ambulatory Visit | Attending: Thoracic Surgery (Cardiothoracic Vascular Surgery) | Admitting: Thoracic Surgery (Cardiothoracic Vascular Surgery)

## 2016-05-19 VITALS — BP 108/72 | HR 109 | Resp 16 | Ht 72.0 in | Wt 206.0 lb

## 2016-05-19 DIAGNOSIS — I209 Angina pectoris, unspecified: Secondary | ICD-10-CM

## 2016-05-19 DIAGNOSIS — I10 Essential (primary) hypertension: Secondary | ICD-10-CM | POA: Diagnosis not present

## 2016-05-19 DIAGNOSIS — Z951 Presence of aortocoronary bypass graft: Secondary | ICD-10-CM

## 2016-05-19 DIAGNOSIS — I2510 Atherosclerotic heart disease of native coronary artery without angina pectoris: Secondary | ICD-10-CM

## 2016-05-19 MED ORDER — METOPROLOL TARTRATE 25 MG PO TABS: 25.0000 mg | ORAL_TABLET | Freq: Two times a day (BID) | ORAL | 1 refills | Status: DC

## 2016-05-19 MED ORDER — RAMIPRIL 2.5 MG PO CAPS: 2.5000 mg | ORAL_CAPSULE | Freq: Every day | ORAL | Status: DC

## 2016-05-19 NOTE — Progress Notes (Signed)
ALL POST OP INCISIONS ARE VERY WELL HEALED.

## 2016-05-19 NOTE — Patient Instructions (Signed)
Decrease Ramipril to 2.5 daily  Can take Lopressor 25 mg twice a day

## 2016-05-19 NOTE — Progress Notes (Signed)
HPI:  Patient returns for routine postoperative follow-up having undergone CABG x4 with radial artery harvest on 04/22/2016.  The patient's early postoperative recovery while in the hospital was straightforward and he was discharged home on POD #5.  Since hospital discharge the patient reports he is doing well for the most part.  He states he continues to have numbness along his left forearm which continues to improve since surgery.  He also states that occasionally he has a "popping" sensation along his left clavicle.  He denies sternal popping or clicking.  He is ambulating up to 30 minutes at a time.  His appetite has returned to normal and his blood sugars have been well controlled with level as high as 180.     Current Outpatient Prescriptions  Medication Sig Dispense Refill  . aspirin EC 81 MG tablet Take 81 mg by mouth at bedtime.     . Choline Fenofibrate (FENOFIBRIC ACID) 45 MG CPDR TAKE 1 CAPSULE ONE TIME DAILY (Patient taking differently: take 45mg s at bedtime) 90 capsule 2  . clopidogrel (PLAVIX) 75 MG tablet TAKE 1 TABLET DAILY (NEEDS APPOINTMENT FOR REFILLS.) 90 tablet 2  . Coenzyme Q10 (CO Q10 PO) Take 30 mg by mouth daily.     Marland Kitchen glimepiride (AMARYL) 4 MG tablet Take 4 mg by mouth 2 (two) times daily.     . isosorbide mononitrate (IMDUR) 30 MG 24 hr tablet Take 1 tablet (30 mg total) by mouth daily. For one month then stop. 30 tablet 0  . metFORMIN (GLUCOPHAGE) 1000 MG tablet Take 1,000 mg by mouth 2 (two) times daily with a meal.    . Multiple Vitamin (MULTIVITAMIN) tablet Take 1 tablet by mouth daily.    . Omega-3 Fatty Acids (FISH OIL) 1200 MG CAPS Take 1 capsule by mouth 2 (two) times daily.     . pravastatin (PRAVACHOL) 80 MG tablet TAKE 1 TABLET EVERY DAY (Patient taking differently: take 80mg s at bedtime) 90 tablet 2  . ramipril (ALTACE) 2.5 MG capsule Take 1 capsule (2.5 mg total) by mouth daily.    . metoprolol tartrate (LOPRESSOR) 25 MG tablet Take 1 tablet (25 mg total)  by mouth 2 (two) times daily. 60 tablet 1  . oxyCODONE (OXY IR/ROXICODONE) 5 MG immediate release tablet Take 5 mg by mouth every 4-6 hours PRN severe pain. (Patient not taking: Reported on 05/19/2016) 20 tablet 0   No current facility-administered medications for this visit.     Physical Exam:  BP 108/72 (BP Location: Right Arm, Patient Position: Sitting, Cuff Size: Large)   Pulse (!) 109   Resp 16   Ht 6' (1.829 m)   Wt 206 lb (93.4 kg)   SpO2 98% Comment: ON RA  BMI 27.94 kg/m   Gen: no apparent distress Heart: RRR, mildly tachy Lungs; CTA bilaterally Ext: no edema present Incisions: well healed  Diagnostic Tests:  CXR: no pleural effusion, pneumothorax,  A/P:  1. S/P CABG x 4 utilizing Radial Artery 04/22/2016- patient will finish 30 day supply of Imdur.  Mild Tachycardia rate 109 today, he will continue Lopressor 25 mg BID, his BP is low around 100, will decrease his Altace to his previous home regimen of 2.5 mg daily... If tachycardia persists can increase Lopressor if BP allows 2. Incisions are well healed, numbness along Left radial artery incision will continue to improve 3. Left Clavicular clicking- no evidence of sternal dehiscence or clavicular abnormality... Continue sternal precautions 4. Dispo- Patient doing very well from cardiac surgery  standpoint, may start cardiac rehab, RTC prn  Deandrae Wajda, PA-C [facility] [phone]

## 2016-05-25 ENCOUNTER — Ambulatory Visit: Payer: Commercial Managed Care - HMO

## 2016-06-02 ENCOUNTER — Ambulatory Visit: Payer: Commercial Managed Care - HMO | Admitting: Thoracic Surgery (Cardiothoracic Vascular Surgery)

## 2016-06-02 ENCOUNTER — Encounter (HOSPITAL_COMMUNITY): Payer: Self-pay

## 2016-06-02 ENCOUNTER — Encounter (HOSPITAL_COMMUNITY)
Admission: RE | Admit: 2016-06-02 | Discharge: 2016-06-02 | Disposition: A | Discharge status: Home / Self Care | Payer: 59 | Source: Ambulatory Visit | Attending: Cardiovascular Disease | Admitting: Cardiovascular Disease

## 2016-06-02 VITALS — BP 126/76 | HR 99 | Ht 72.0 in | Wt 210.3 lb

## 2016-06-02 DIAGNOSIS — Z951 Presence of aortocoronary bypass graft: Secondary | ICD-10-CM

## 2016-06-02 NOTE — Progress Notes (Signed)
Cardiac/Pulmonary Rehab Medication Review by a Pharmacist  Does the patient  feel that his/her medications are working for him/her?  yes  Has the patient been experiencing any side effects to the medications prescribed?  no  Does the patient measure his/her own blood pressure or blood glucose at home?  yes   Does the patient have any problems obtaining medications due to transportation or finances?   no  Understanding of regimen: excellent Understanding of indications: excellent Potential of compliance: excellent  Pharmacist comments: His wife is a Education administrator and helps him with his medications    Beverlee Nims 06/02/2016 9:16 AM

## 2016-06-02 NOTE — Progress Notes (Signed)
Cardiac Individual Treatment Plan  Patient Details  Name: Jonathan Shepard MRN: [id number] Date of Birth: 09-18-1950 Referring Provider:   Flowsheet Row CARDIAC REHAB PHASE II ORIENTATION from 06/02/2016 in [place]  Referring Provider  [name]      Initial Encounter Date:  Flowsheet Row CARDIAC REHAB PHASE II ORIENTATION from 06/02/2016 in [place]  Date  06/02/16  Referring Provider  [name]      Visit Diagnosis: S/P CABG x 4  Patient's Home Medications on Admission:  Current Outpatient Prescriptions:  .  diclofenac (VOLTAREN) 75 MG EC tablet, Take 75 mg by mouth 2 (two) times daily as needed for moderate pain., Disp: , Rfl:  .  HYDROcodone-acetaminophen (NORCO) 10-325 MG tablet, Take 1 tablet by mouth every 6 (six) hours as needed for moderate pain or severe pain., Disp: , Rfl:  .  aspirin EC 81 MG tablet, Take 81 mg by mouth at bedtime. , Disp: , Rfl:  .  Choline Fenofibrate (FENOFIBRIC ACID) 45 MG CPDR, TAKE 1 CAPSULE ONE TIME DAILY (Patient taking differently: take 45mg s at bedtime), Disp: 90 capsule, Rfl: 2 .  clopidogrel (PLAVIX) 75 MG tablet, TAKE 1 TABLET DAILY (NEEDS APPOINTMENT FOR REFILLS.), Disp: 90 tablet, Rfl: 2 .  Coenzyme Q10 (CO Q10 PO), Take 30 mg by mouth daily. , Disp: , Rfl:  .  glimepiride (AMARYL) 4 MG tablet, Take 4 mg by mouth 2 (two) times daily. , Disp: , Rfl:  .  isosorbide mononitrate (IMDUR) 30 MG 24 hr tablet, Take 1 tablet (30 mg total) by mouth daily. For one month then stop. (Patient not taking: Reported on 06/02/2016), Disp: 30 tablet, Rfl: 0 .  metFORMIN (GLUCOPHAGE) 1000 MG tablet, Take 1,000 mg by mouth 2 (two) times daily with a meal., Disp: , Rfl:  .  metoprolol tartrate (LOPRESSOR) 25 MG tablet, Take 1 tablet (25 mg total) by mouth 2 (two) times daily., Disp: 60 tablet, Rfl: 1 .  Multiple Vitamin (MULTIVITAMIN) tablet, Take 1 tablet by mouth daily., Disp: , Rfl:  .  Omega-3 Fatty  Acids (FISH OIL) 1200 MG CAPS, Take 1 capsule by mouth 2 (two) times daily. , Disp: , Rfl:  .  oxyCODONE (OXY IR/ROXICODONE) 5 MG immediate release tablet, Take 5 mg by mouth every 4-6 hours PRN severe pain. (Patient not taking: Reported on 06/02/2016), Disp: 20 tablet, Rfl: 0 .  pravastatin (PRAVACHOL) 80 MG tablet, TAKE 1 TABLET EVERY DAY (Patient taking differently: take 80mg s at bedtime), Disp: 90 tablet, Rfl: 2 .  ramipril (ALTACE) 2.5 MG capsule, Take 1 capsule (2.5 mg total) by mouth daily., Disp: , Rfl:   Past Medical History: Past Medical History:  Diagnosis Date  . CAD (coronary artery disease)   . Diabetes ([place])   . Dyspnea   . Hyperlipidemia   . Hypertension   . Obesity     Tobacco Use: History  Smoking Status  . Never Smoker  Smokeless Tobacco  . Never Used    Labs: Recent Review Flowsheet Data    Labs for ITP Cardiac and Pulmonary Rehab Latest Ref Rng & Units 04/22/2016 04/23/2016 04/23/2016 04/23/2016 04/23/2016   Cholestrol 125 - 200 mg/dL - - - - -   LDLCALC <130 mg/dL - - - - -   HDL >=40 mg/dL - - - - -   Trlycerides <150 mg/dL - - - - -   Hemoglobin A1c 4.8 - 5.6 % - - - - -   PHART  7.350 - 7.450 7.338(L) 7.330(L) - 7.358 -   PCO2ART 32.0 - 48.0 mmHg 41.8 42.0 - 38.9 -   HCO3 20.0 - 28.0 mmol/L 22.5 22.1 - 21.9 -   TCO2 0 - 100 mmol/L 24 23 23 23 22    ACIDBASEDEF 0.0 - 2.0 mmol/L 3.0(H) 4.0(H) - 3.0(H) -   O2SAT % 90.0 97.0 - 90.0 -      Capillary Blood Glucose: Lab Results  Component Value Date   GLUCAP 135 (H) 04/27/2016   GLUCAP 136 (H) 04/27/2016   GLUCAP 129 (H) 04/26/2016   GLUCAP 102 (H) 04/26/2016   GLUCAP 150 (H) 04/26/2016     Exercise Target Goals: Date: 06/02/16  Exercise Program Goal: Individual exercise prescription set with THRR, safety & activity barriers. Participant demonstrates ability to understand and report RPE using BORG scale, to self-measure pulse accurately, and to acknowledge the importance of the exercise  prescription.  Exercise Prescription Goal: Starting with aerobic activity 30 plus minutes a day, 3 days per week for initial exercise prescription. Provide home exercise prescription and guidelines that participant acknowledges understanding prior to discharge.  Activity Barriers & Risk Stratification:     Activity Barriers & Cardiac Risk Stratification - 06/02/16 1105      Activity Barriers & Cardiac Risk Stratification   Activity Barriers Arthritis   Cardiac Risk Stratification High      6 Minute Walk:     6 Minute Walk    Row Name 06/02/16 0959         6 Minute Walk   Phase Initial     Distance 1400 feet     Distance % Change 0 %     Walk Time 6 minutes     # of Rest Breaks 0     MPH 2.65     METS 3.03     RPE 9     Perceived Dyspnea  11     VO2 Peak 12.71     Symptoms No     Resting HR 99 bpm     Resting BP 126/76     Max Ex. HR 111 bpm     Max Ex. BP 138/80     2 Minute Post BP 128/78        Initial Exercise Prescription:     Initial Exercise Prescription - 06/02/16 1000      Date of Initial Exercise RX and Referring Provider   Date 06/02/16   Referring Provider [name]     Treadmill   MPH 1.3   Grade 0   Minutes 15   METs 1.9     NuStep   Level 2   Watts 20   Minutes 20   METs 2     Prescription Details   Frequency (times per week) 3   Duration Progress to 30 minutes of continuous aerobic without signs/symptoms of physical distress     Intensity   THRR REST +  30   THRR 40-80% of Max Heartrate [id number]   Ratings of Perceived Exertion 11-13   Perceived Dyspnea 0-4     Progression   Progression Continue progressive overload as per policy without signs/symptoms or physical distress.     Resistance Training   Training Prescription Yes   Weight 1   Reps 10-12      Perform Capillary Blood Glucose checks as needed.  Exercise Prescription Changes:   Exercise Comments:    Discharge Exercise Prescription (Final Exercise  Prescription Changes):   Nutrition:  Target Goals: Understanding of nutrition guidelines, daily intake of sodium 1500mg , cholesterol 200mg , calories 30% from fat and 7% or less from saturated fats, daily to have 5 or more servings of fruits and vegetables.  Biometrics:     Pre Biometrics - 06/02/16 1003      Pre Biometrics   Height 6' (1.829 m)   Weight 210 lb 5.1 oz (95.4 kg)   Waist Circumference 39 inches   Hip Circumference 38.5 inches   Waist to Hip Ratio 1.01 %   BMI (Calculated) 28.6   Triceps Skinfold 14 mm   % Body Fat 26.5 %   Grip Strength 43.67 kg   Flexibility 11.5 in   Single Leg Stand 6 seconds       Nutrition Therapy Plan and Nutrition Goals:   Nutrition Discharge: Rate Your Plate Scores:     Nutrition Assessments - 06/02/16 1110      MEDFICTS Scores   Pre Score 36      Nutrition Goals Re-Evaluation:   Psychosocial: Target Goals: Acknowledge presence or absence of depression, maximize coping skills, provide positive support system. Participant is able to verbalize types and ability to use techniques and skills needed for reducing stress and depression.  Initial Review & Psychosocial Screening:     Initial Psych Review & Screening - 06/02/16 1113      Initial Review   Current issues with --  No concerns at this time     [place]? Yes     Barriers   Psychosocial barriers to participate in program There are no identifiable barriers or psychosocial needs.     Screening Interventions   Interventions Encouraged to exercise      Quality of Life Scores:     Quality of Life - 06/02/16 1004      Quality of Life Scores   Health/Function Pre 28.2 %   Socioeconomic Pre 30 %   Psych/Spiritual Pre 30 %   Family Pre 30 %   GLOBAL Pre 29.23 %      PHQ-9: Recent Review Flowsheet Data    Depression screen [facility] 2/9 06/02/2016   Decreased Interest 0   Down, Depressed, Hopeless 0   PHQ - 2 Score 0   Altered  sleeping 0   Tired, decreased energy 0   Change in appetite 0   Feeling bad or failure about yourself  0   Trouble concentrating 0   Moving slowly or fidgety/restless 0   Suicidal thoughts 0   PHQ-9 Score 0      Psychosocial Evaluation and Intervention:     Psychosocial Evaluation - 06/02/16 1114      Psychosocial Evaluation & Interventions   Interventions Encouraged to exercise with the program and follow exercise prescription   Continued Psychosocial Services Needed No  QOL scores WNL: 29.23      Psychosocial Re-Evaluation:   Vocational Rehabilitation: Provide vocational rehab assistance to qualifying candidates.   Vocational Rehab Evaluation & Intervention:     Vocational Rehab - 06/02/16 1106      Initial Vocational Rehab Evaluation & Intervention   Assessment shows need for Vocational Rehabilitation No      Education: Education Goals: Education classes will be provided on a weekly basis, covering required topics. Participant will state understanding/return demonstration of topics presented.  Learning Barriers/Preferences:     Learning Barriers/Preferences - 06/02/16 1106      Learning Barriers/Preferences   Learning Barriers None   Learning Preferences Written Material;Video  Education Topics: Hypertension, Hypertension Reduction -Define heart disease and high blood pressure. Discus how high blood pressure affects the body and ways to reduce high blood pressure.   Exercise and Your Heart -Discuss why it is important to exercise, the FITT principles of exercise, normal and abnormal responses to exercise, and how to exercise safely.   Angina -Discuss definition of angina, causes of angina, treatment of angina, and how to decrease risk of having angina.   Cardiac Medications -Review what the following cardiac medications are used for, how they affect the body, and side effects that may occur when taking the medications.  Medications include  Aspirin, Beta blockers, calcium channel blockers, ACE Inhibitors, angiotensin receptor blockers, diuretics, digoxin, and antihyperlipidemics.   Congestive Heart Failure -Discuss the definition of CHF, how to live with CHF, the signs and symptoms of CHF, and how keep track of weight and sodium intake.   Heart Disease and Intimacy -Discus the effect sexual activity has on the heart, how changes occur during intimacy as we age, and safety during sexual activity.   Smoking Cessation / COPD -Discuss different methods to quit smoking, the health benefits of quitting smoking, and the definition of COPD.   Nutrition I: Fats -Discuss the types of cholesterol, what cholesterol does to the heart, and how cholesterol levels can be controlled.   Nutrition II: Labels -Discuss the different components of food labels and how to read food label   Heart Parts and Heart Disease -Discuss the anatomy of the heart, the pathway of blood circulation through the heart, and these are affected by heart disease.   Stress I: Signs and Symptoms -Discuss the causes of stress, how stress may lead to anxiety and depression, and ways to limit stress.   Stress II: Relaxation -Discuss different types of relaxation techniques to limit stress.   Warning Signs of Stroke / TIA -Discuss definition of a stroke, what the signs and symptoms are of a stroke, and how to identify when someone is having stroke.   Knowledge Questionnaire Score:     Knowledge Questionnaire Score - 06/02/16 1106      Knowledge Questionnaire Score   Pre Score 22/24      Core Components/Risk Factors/Patient Goals at Admission:     Personal Goals and Risk Factors at Admission - 06/02/16 1110      Core Components/Risk Factors/Patient Goals on Admission    Weight Management Weight Maintenance   Increase Strength and Stamina Yes   Intervention Provide advice, education, support and counseling about physical activity/exercise  needs.;Develop an individualized exercise prescription for aerobic and resistive training based on initial evaluation findings, risk stratification, comorbidities and participant's personal goals.   Expected Outcomes Achievement of increased cardiorespiratory fitness and enhanced flexibility, muscular endurance and strength shown through measurements of functional capacity and personal statement of participant.   Personal Goal Other Yes   Personal Goal Get physically active again, gain some muscle back   Intervention Attend Cardiac Rehab 3xweek and to supplement exercise 2xweek at home.    Expected Outcomes Achieve personal goals.       Core Components/Risk Factors/Patient Goals Review:      Goals and Risk Factor Review    Row Name 06/02/16 1113             Core Components/Risk Factors/Patient Goals Review   Personal Goals Review Weight Management/Obesity;Increase Strength and Stamina          Core Components/Risk Factors/Patient Goals at Discharge (Final Review):  Goals and Risk Factor Review - 06/02/16 1113      Core Components/Risk Factors/Patient Goals Review   Personal Goals Review Weight Management/Obesity;Increase Strength and Stamina      ITP Comments:   Comments: Patient arrived for 1st visit/orientation/education at 0800. Patient was referred to CR by [name] due to CABGx4 (Z95.1). During orientation advised patient on arrival and appointment times what to wear, what to do before, during and after exercise. Reviewed attendance and class policy. Talked about inclement weather and class consultation policy. Pt is scheduled to return Cardiac Rehab on 06/10/2016 at 0930. Pt was advised to come to class 15 minutes before class starts. Patient was also given instructions on meeting with the dietician and attending the Family Structure classes. Pt is eager to get started. Patient participated in warm up stretches followed by resistance bands and light weights. Patient  was able to complete 6 minute walk test. Patient was measured for the equipment. Discussed equipment safety with patient. Took patient pre-anthropometric measurements. Patient finished visit at 10:00.

## 2016-06-10 ENCOUNTER — Encounter (HOSPITAL_COMMUNITY)
Admission: RE | Admit: 2016-06-10 | Discharge: 2016-06-10 | Disposition: A | Discharge status: Home / Self Care | Payer: Medicare HMO | Source: Ambulatory Visit | Attending: Cardiovascular Disease | Admitting: Cardiovascular Disease

## 2016-06-10 DIAGNOSIS — Z951 Presence of aortocoronary bypass graft: Secondary | ICD-10-CM | POA: Insufficient documentation

## 2016-06-10 DIAGNOSIS — Z7982 Long term (current) use of aspirin: Secondary | ICD-10-CM | POA: Diagnosis not present

## 2016-06-10 DIAGNOSIS — Z7984 Long term (current) use of oral hypoglycemic drugs: Secondary | ICD-10-CM | POA: Diagnosis not present

## 2016-06-10 DIAGNOSIS — E119 Type 2 diabetes mellitus without complications: Secondary | ICD-10-CM | POA: Diagnosis not present

## 2016-06-10 DIAGNOSIS — E785 Hyperlipidemia, unspecified: Secondary | ICD-10-CM | POA: Insufficient documentation

## 2016-06-10 DIAGNOSIS — I2510 Atherosclerotic heart disease of native coronary artery without angina pectoris: Secondary | ICD-10-CM | POA: Insufficient documentation

## 2016-06-10 DIAGNOSIS — I10 Essential (primary) hypertension: Secondary | ICD-10-CM | POA: Diagnosis not present

## 2016-06-10 DIAGNOSIS — Z79899 Other long term (current) drug therapy: Secondary | ICD-10-CM | POA: Diagnosis not present

## 2016-06-10 DIAGNOSIS — E669 Obesity, unspecified: Secondary | ICD-10-CM | POA: Insufficient documentation

## 2016-06-10 DIAGNOSIS — Z7902 Long term (current) use of antithrombotics/antiplatelets: Secondary | ICD-10-CM | POA: Diagnosis not present

## 2016-06-10 NOTE — Progress Notes (Signed)
Daily Session Note  Patient Details  Name: Jonathan Shepard MRN: [id number] Date of Birth: 1950/11/18 Referring Provider:   Flowsheet Row CARDIAC REHAB PHASE II ORIENTATION from 06/02/2016 in [place]  Referring Provider  [name]      Encounter Date: 06/10/2016  Check In:     Session Check In - 06/10/16 0930      Check-In   Location [facility]   Staff Present Russella Dar, MS, EP, [facility], Exercise Physiologist;Hameed Kolar Wynetta Emery, RN, BSN   Supervising physician immediately available to respond to emergencies See telemetry face sheet for immediately available MD   Medication changes reported     No   Fall or balance concerns reported    No   Warm-up and Cool-down Performed as group-led instruction   Resistance Training Performed Yes   VAD Patient? No     Pain Assessment   Currently in Pain? No/denies   Pain Score 0-No pain   Multiple Pain Sites No      Capillary Blood Glucose: No results found for this or any previous visit (from the past 24 hour(s)).   Goals Met:  Independence with exercise equipment Exercise tolerated well No report of cardiac concerns or symptoms Strength training completed today  Goals Unmet:  Not Applicable  Comments: Check out 1030.   [name] is Medical Director for [facility] Cardiac and Pulmonary Rehab.

## 2016-06-12 ENCOUNTER — Encounter (HOSPITAL_COMMUNITY)
Admission: RE | Admit: 2016-06-12 | Discharge: 2016-06-12 | Disposition: A | Discharge status: Home / Self Care | Payer: Medicare HMO | Source: Ambulatory Visit | Attending: Cardiovascular Disease | Admitting: Cardiovascular Disease

## 2016-06-12 DIAGNOSIS — E119 Type 2 diabetes mellitus without complications: Secondary | ICD-10-CM | POA: Diagnosis not present

## 2016-06-12 DIAGNOSIS — Z951 Presence of aortocoronary bypass graft: Secondary | ICD-10-CM | POA: Diagnosis not present

## 2016-06-12 DIAGNOSIS — Z7902 Long term (current) use of antithrombotics/antiplatelets: Secondary | ICD-10-CM | POA: Diagnosis not present

## 2016-06-12 DIAGNOSIS — E785 Hyperlipidemia, unspecified: Secondary | ICD-10-CM | POA: Diagnosis not present

## 2016-06-12 DIAGNOSIS — Z7982 Long term (current) use of aspirin: Secondary | ICD-10-CM | POA: Diagnosis not present

## 2016-06-12 DIAGNOSIS — I10 Essential (primary) hypertension: Secondary | ICD-10-CM | POA: Diagnosis not present

## 2016-06-12 DIAGNOSIS — E669 Obesity, unspecified: Secondary | ICD-10-CM | POA: Diagnosis not present

## 2016-06-12 DIAGNOSIS — I2510 Atherosclerotic heart disease of native coronary artery without angina pectoris: Secondary | ICD-10-CM | POA: Diagnosis not present

## 2016-06-12 DIAGNOSIS — Z79899 Other long term (current) drug therapy: Secondary | ICD-10-CM | POA: Diagnosis not present

## 2016-06-12 NOTE — Progress Notes (Signed)
Daily Session Note  Patient Details  Name: Jonathan Shepard MRN: [id number] Date of Birth: [birth year] Referring Provider:   Flowsheet Row CARDIAC REHAB PHASE II ORIENTATION from 06/02/2016 in [place]  Referring Provider  [name]      Encounter Date: 06/12/2016  Check In:     Session Check In - 06/12/16 0930      Check-In   Location [facility]   Staff Present Suzanne Boron, BS, EP, Exercise Physiologist;Rodney Wigger Wynetta Emery, RN, BSN   Supervising physician immediately available to respond to emergencies See telemetry face sheet for immediately available MD   Medication changes reported     No   Fall or balance concerns reported    No   Warm-up and Cool-down Performed as group-led instruction   Resistance Training Performed Yes   VAD Patient? No     Pain Assessment   Currently in Pain? No/denies   Pain Score 0-No pain   Multiple Pain Sites No      Capillary Blood Glucose: No results found for this or any previous visit (from the past 24 hour(s)).   Goals Met:  Independence with exercise equipment Exercise tolerated well No report of cardiac concerns or symptoms Strength training completed today  Goals Unmet:  Not Applicable  Comments: Check out 1030.   [name] is Medical Director for [facility] Cardiac and Pulmonary Rehab.

## 2016-06-15 ENCOUNTER — Encounter (HOSPITAL_COMMUNITY)
Admission: RE | Admit: 2016-06-15 | Discharge: 2016-06-15 | Disposition: A | Discharge status: Home / Self Care | Payer: Medicare HMO | Source: Ambulatory Visit | Attending: Cardiovascular Disease | Admitting: Cardiovascular Disease

## 2016-06-15 DIAGNOSIS — Z951 Presence of aortocoronary bypass graft: Secondary | ICD-10-CM | POA: Diagnosis not present

## 2016-06-15 DIAGNOSIS — I2510 Atherosclerotic heart disease of native coronary artery without angina pectoris: Secondary | ICD-10-CM | POA: Diagnosis not present

## 2016-06-15 DIAGNOSIS — E669 Obesity, unspecified: Secondary | ICD-10-CM | POA: Diagnosis not present

## 2016-06-15 DIAGNOSIS — E119 Type 2 diabetes mellitus without complications: Secondary | ICD-10-CM | POA: Diagnosis not present

## 2016-06-15 DIAGNOSIS — Z79899 Other long term (current) drug therapy: Secondary | ICD-10-CM | POA: Diagnosis not present

## 2016-06-15 DIAGNOSIS — Z7982 Long term (current) use of aspirin: Secondary | ICD-10-CM | POA: Diagnosis not present

## 2016-06-15 DIAGNOSIS — I10 Essential (primary) hypertension: Secondary | ICD-10-CM | POA: Diagnosis not present

## 2016-06-15 DIAGNOSIS — E785 Hyperlipidemia, unspecified: Secondary | ICD-10-CM | POA: Diagnosis not present

## 2016-06-15 DIAGNOSIS — Z7902 Long term (current) use of antithrombotics/antiplatelets: Secondary | ICD-10-CM | POA: Diagnosis not present

## 2016-06-15 NOTE — Progress Notes (Signed)
Daily Session Note  Patient Details  Name: Jonathan Shepard MRN: [id number] Date of Birth: 03/12/1951 Referring Provider:   Flowsheet Row CARDIAC REHAB PHASE II ORIENTATION from 06/02/2016 in [place]  Referring Provider  [name]      Encounter Date: 06/15/2016  Check In:     Session Check In - 06/15/16 0930      Check-In   Location [facility]   Staff Present Suzanne Boron, BS, EP, Exercise Physiologist;Kaelob Persky Wynetta Emery, RN, BSN   Supervising physician immediately available to respond to emergencies See telemetry face sheet for immediately available MD   Medication changes reported     No   Fall or balance concerns reported    No   Warm-up and Cool-down Performed as group-led instruction   Resistance Training Performed Yes   VAD Patient? No     Pain Assessment   Currently in Pain? No/denies   Pain Score 0-No pain   Multiple Pain Sites No      Capillary Blood Glucose: No results found for this or any previous visit (from the past 24 hour(s)).   Goals Met:  Independence with exercise equipment Exercise tolerated well No report of cardiac concerns or symptoms Strength training completed today  Goals Unmet:  Not Applicable  Comments: Check out 1030.   [name] is Medical Director for [facility] Cardiac and Pulmonary Rehab.

## 2016-06-17 ENCOUNTER — Encounter (HOSPITAL_COMMUNITY)
Admission: RE | Admit: 2016-06-17 | Discharge: 2016-06-17 | Disposition: A | Discharge status: Home / Self Care | Payer: Medicare HMO | Source: Ambulatory Visit | Attending: Cardiovascular Disease | Admitting: Cardiovascular Disease

## 2016-06-17 DIAGNOSIS — Z7902 Long term (current) use of antithrombotics/antiplatelets: Secondary | ICD-10-CM | POA: Diagnosis not present

## 2016-06-17 DIAGNOSIS — Z79899 Other long term (current) drug therapy: Secondary | ICD-10-CM | POA: Diagnosis not present

## 2016-06-17 DIAGNOSIS — E119 Type 2 diabetes mellitus without complications: Secondary | ICD-10-CM | POA: Diagnosis not present

## 2016-06-17 DIAGNOSIS — E785 Hyperlipidemia, unspecified: Secondary | ICD-10-CM | POA: Diagnosis not present

## 2016-06-17 DIAGNOSIS — Z951 Presence of aortocoronary bypass graft: Secondary | ICD-10-CM

## 2016-06-17 DIAGNOSIS — I10 Essential (primary) hypertension: Secondary | ICD-10-CM | POA: Diagnosis not present

## 2016-06-17 DIAGNOSIS — I2510 Atherosclerotic heart disease of native coronary artery without angina pectoris: Secondary | ICD-10-CM | POA: Diagnosis not present

## 2016-06-17 DIAGNOSIS — E669 Obesity, unspecified: Secondary | ICD-10-CM | POA: Diagnosis not present

## 2016-06-17 DIAGNOSIS — Z7982 Long term (current) use of aspirin: Secondary | ICD-10-CM | POA: Diagnosis not present

## 2016-06-17 NOTE — Progress Notes (Signed)
Daily Session Note  Patient Details  Name: Jonathan Shepard MRN: [id number] Date of Birth: Nov 16, 1950 Referring Provider:   Flowsheet Row CARDIAC REHAB PHASE II ORIENTATION from 06/02/2016 in [place]  Referring Provider  [name]      Encounter Date: 06/17/2016  Check In:     Session Check In - 06/17/16 0930      Check-In   Location [facility]   Staff Present Suzanne Boron, BS, EP, Exercise Physiologist;Delayne Sanzo Wynetta Emery, RN, BSN   Supervising physician immediately available to respond to emergencies See telemetry face sheet for immediately available MD   Medication changes reported     No   Fall or balance concerns reported    No   Warm-up and Cool-down Performed as group-led instruction   Resistance Training Performed Yes   VAD Patient? No     Pain Assessment   Currently in Pain? No/denies   Pain Score 0-No pain   Multiple Pain Sites No      Capillary Blood Glucose: No results found for this or any previous visit (from the past 24 hour(s)).   Goals Met:  Independence with exercise equipment Exercise tolerated well No report of cardiac concerns or symptoms Strength training completed today  Goals Unmet:  Not Applicable  Comments: Check out 1030.   [name] is Medical Director for [facility] Cardiac and Pulmonary Rehab.

## 2016-06-18 NOTE — Progress Notes (Signed)
Cardiac Individual Treatment Plan  Patient Details  Name: Jonathan Shepard MRN: [id number] Date of Birth: July 11, 1950 Referring Provider:   Flowsheet Row CARDIAC REHAB PHASE II ORIENTATION from 06/02/2016 in [place]  Referring Provider  [name]      Initial Encounter Date:  Flowsheet Row CARDIAC REHAB PHASE II ORIENTATION from 06/02/2016 in [place]  Date  06/02/16  Referring Provider  [name]      Visit Diagnosis: S/P CABG x 4  Patient's Home Medications on Admission:  Current Outpatient Prescriptions:  .  aspirin EC 81 MG tablet, Take 81 mg by mouth at bedtime. , Disp: , Rfl:  .  Choline Fenofibrate (FENOFIBRIC ACID) 45 MG CPDR, TAKE 1 CAPSULE ONE TIME DAILY (Patient taking differently: take 45mg s at bedtime), Disp: 90 capsule, Rfl: 2 .  clopidogrel (PLAVIX) 75 MG tablet, TAKE 1 TABLET DAILY (NEEDS APPOINTMENT FOR REFILLS.), Disp: 90 tablet, Rfl: 2 .  Coenzyme Q10 (CO Q10 PO), Take 30 mg by mouth daily. , Disp: , Rfl:  .  diclofenac (VOLTAREN) 75 MG EC tablet, Take 75 mg by mouth 2 (two) times daily as needed for moderate pain., Disp: , Rfl:  .  glimepiride (AMARYL) 4 MG tablet, Take 4 mg by mouth 2 (two) times daily. , Disp: , Rfl:  .  HYDROcodone-acetaminophen (NORCO) 10-325 MG tablet, Take 1 tablet by mouth every 6 (six) hours as needed for moderate pain or severe pain., Disp: , Rfl:  .  isosorbide mononitrate (IMDUR) 30 MG 24 hr tablet, Take 1 tablet (30 mg total) by mouth daily. For one month then stop. (Patient not taking: Reported on 06/02/2016), Disp: 30 tablet, Rfl: 0 .  metFORMIN (GLUCOPHAGE) 1000 MG tablet, Take 1,000 mg by mouth 2 (two) times daily with a meal., Disp: , Rfl:  .  metoprolol tartrate (LOPRESSOR) 25 MG tablet, Take 1 tablet (25 mg total) by mouth 2 (two) times daily., Disp: 60 tablet, Rfl: 1 .  Multiple Vitamin (MULTIVITAMIN) tablet, Take 1 tablet by mouth daily., Disp: , Rfl:  .  Omega-3 Fatty  Acids (FISH OIL) 1200 MG CAPS, Take 1 capsule by mouth 2 (two) times daily. , Disp: , Rfl:  .  oxyCODONE (OXY IR/ROXICODONE) 5 MG immediate release tablet, Take 5 mg by mouth every 4-6 hours PRN severe pain. (Patient not taking: Reported on 06/02/2016), Disp: 20 tablet, Rfl: 0 .  pravastatin (PRAVACHOL) 80 MG tablet, TAKE 1 TABLET EVERY DAY (Patient taking differently: take 80mg s at bedtime), Disp: 90 tablet, Rfl: 2 .  ramipril (ALTACE) 2.5 MG capsule, Take 1 capsule (2.5 mg total) by mouth daily., Disp: , Rfl:   Past Medical History: Past Medical History:  Diagnosis Date  . CAD (coronary artery disease)   . Diabetes ([place])   . Dyspnea   . Hyperlipidemia   . Hypertension   . Obesity     Tobacco Use: History  Smoking Status  . Never Smoker  Smokeless Tobacco  . Never Used    Labs: Recent Review Flowsheet Data    Labs for ITP Cardiac and Pulmonary Rehab Latest Ref Rng & Units 04/22/2016 04/23/2016 04/23/2016 04/23/2016 04/23/2016   Cholestrol 125 - 200 mg/dL - - - - -   LDLCALC <130 mg/dL - - - - -   HDL >=40 mg/dL - - - - -   Trlycerides <150 mg/dL - - - - -   Hemoglobin A1c 4.8 - 5.6 % - - - - -   PHART  7.350 - 7.450 7.338(L) 7.330(L) - 7.358 -   PCO2ART 32.0 - 48.0 mmHg 41.8 42.0 - 38.9 -   HCO3 20.0 - 28.0 mmol/L 22.5 22.1 - 21.9 -   TCO2 0 - 100 mmol/L 24 23 23 23 22    ACIDBASEDEF 0.0 - 2.0 mmol/L 3.0(H) 4.0(H) - 3.0(H) -   O2SAT % 90.0 97.0 - 90.0 -      Capillary Blood Glucose: Lab Results  Component Value Date   GLUCAP 135 (H) 04/27/2016   GLUCAP 136 (H) 04/27/2016   GLUCAP 129 (H) 04/26/2016   GLUCAP 102 (H) 04/26/2016   GLUCAP 150 (H) 04/26/2016     Exercise Target Goals:    Exercise Program Goal: Individual exercise prescription set with THRR, safety & activity barriers. Participant demonstrates ability to understand and report RPE using BORG scale, to self-measure pulse accurately, and to acknowledge the importance of the exercise  prescription.  Exercise Prescription Goal: Starting with aerobic activity 30 plus minutes a day, 3 days per week for initial exercise prescription. Provide home exercise prescription and guidelines that participant acknowledges understanding prior to discharge.  Activity Barriers & Risk Stratification:     Activity Barriers & Cardiac Risk Stratification - 06/02/16 1105      Activity Barriers & Cardiac Risk Stratification   Activity Barriers Arthritis   Cardiac Risk Stratification High      6 Minute Walk:     6 Minute Walk    Row Name 06/02/16 0959         6 Minute Walk   Phase Initial     Distance 1400 feet     Distance % Change 0 %     Walk Time 6 minutes     # of Rest Breaks 0     MPH 2.65     METS 3.03     RPE 9     Perceived Dyspnea  11     VO2 Peak 12.71     Symptoms No     Resting HR 99 bpm     Resting BP 126/76     Max Ex. HR 111 bpm     Max Ex. BP 138/80     2 Minute Post BP 128/78        Initial Exercise Prescription:     Initial Exercise Prescription - 06/02/16 1000      Date of Initial Exercise RX and Referring Provider   Date 06/02/16   Referring Provider [name]     Treadmill   MPH 1.3   Grade 0   Minutes 15   METs 1.9     NuStep   Level 2   Watts 20   Minutes 20   METs 2     Prescription Details   Frequency (times per week) 3   Duration Progress to 30 minutes of continuous aerobic without signs/symptoms of physical distress     Intensity   THRR REST +  30   THRR 40-80% of Max Heartrate [phone]   Ratings of Perceived Exertion 11-13   Perceived Dyspnea 0-4     Progression   Progression Continue progressive overload as per policy without signs/symptoms or physical distress.     Resistance Training   Training Prescription Yes   Weight 1   Reps 10-12      Perform Capillary Blood Glucose checks as needed.  Exercise Prescription Changes:      Exercise Prescription Changes    Row Name 06/15/16 1400  Exercise Review   Progression Yes         Response to Exercise   Blood Pressure (Admit) 150/78       Blood Pressure (Exercise) 146/70       Blood Pressure (Exit) 118/62       Heart Rate (Admit) 77 bpm       Heart Rate (Exercise) 100 bpm       Heart Rate (Exit) 86 bpm       Rating of Perceived Exertion (Exercise) 7       Duration Progress to 30 minutes of continuous aerobic without signs/symptoms of physical distress       Intensity Rest + 30         Progression   Progression Continue progressive overload as per policy without signs/symptoms or physical distress.         Resistance Training   Training Prescription Yes       Weight 2       Reps 10-12         Treadmill   MPH 2       Grade 0       Minutes 15       METs 2.5         NuStep   Level 2       Watts 11       Minutes 20       METs 3.65         Home Exercise Plan   Plans to continue exercise at Home       Frequency Add 2 additional days to program exercise sessions.          Exercise Comments:      Exercise Comments    Row Name 06/15/16 1448           Exercise Comments Patient is progressing well.           Discharge Exercise Prescription (Final Exercise Prescription Changes):     Exercise Prescription Changes - 06/15/16 1400      Exercise Review   Progression Yes     Response to Exercise   Blood Pressure (Admit) 150/78   Blood Pressure (Exercise) 146/70   Blood Pressure (Exit) 118/62   Heart Rate (Admit) 77 bpm   Heart Rate (Exercise) 100 bpm   Heart Rate (Exit) 86 bpm   Rating of Perceived Exertion (Exercise) 7   Duration Progress to 30 minutes of continuous aerobic without signs/symptoms of physical distress   Intensity Rest + 30     Progression   Progression Continue progressive overload as per policy without signs/symptoms or physical distress.     Resistance Training   Training Prescription Yes   Weight 2   Reps 10-12     Treadmill   MPH 2   Grade 0   Minutes 15    METs 2.5     NuStep   Level 2   Watts 11   Minutes 20   METs 3.65     Home Exercise Plan   Plans to continue exercise at Home   Frequency Add 2 additional days to program exercise sessions.      Nutrition:  Target Goals: Understanding of nutrition guidelines, daily intake of sodium 1500mg , cholesterol 200mg , calories 30% from fat and 7% or less from saturated fats, daily to have 5 or more servings of fruits and vegetables.  Biometrics:     Pre Biometrics - 06/02/16 1003      Pre Biometrics  Height 6' (1.829 m)   Weight 210 lb 5.1 oz (95.4 kg)   Waist Circumference 39 inches   Hip Circumference 38.5 inches   Waist to Hip Ratio 1.01 %   BMI (Calculated) 28.6   Triceps Skinfold 14 mm   % Body Fat 26.5 %   Grip Strength 43.67 kg   Flexibility 11.5 in   Single Leg Stand 6 seconds       Nutrition Therapy Plan and Nutrition Goals:   Nutrition Discharge: Rate Your Plate Scores:     Nutrition Assessments - 06/02/16 1110      MEDFICTS Scores   Pre Score 36      Nutrition Goals Re-Evaluation:   Psychosocial: Target Goals: Acknowledge presence or absence of depression, maximize coping skills, provide positive support system. Participant is able to verbalize types and ability to use techniques and skills needed for reducing stress and depression.  Initial Review & Psychosocial Screening:     Initial Psych Review & Screening - 06/02/16 1113      Initial Review   Current issues with --  No concerns at this time     [place]? Yes     Barriers   Psychosocial barriers to participate in program There are no identifiable barriers or psychosocial needs.     Screening Interventions   Interventions Encouraged to exercise      Quality of Life Scores:     Quality of Life - 06/02/16 1004      Quality of Life Scores   Health/Function Pre 28.2 %   Socioeconomic Pre 30 %   Psych/Spiritual Pre 30 %   Family Pre 30 %   GLOBAL Pre  29.23 %      PHQ-9: Recent Review Flowsheet Data    Depression screen [facility] 2/9 06/02/2016   Decreased Interest 0   Down, Depressed, Hopeless 0   PHQ - 2 Score 0   Altered sleeping 0   Tired, decreased energy 0   Change in appetite 0   Feeling bad or failure about yourself  0   Trouble concentrating 0   Moving slowly or fidgety/restless 0   Suicidal thoughts 0   PHQ-9 Score 0      Psychosocial Evaluation and Intervention:     Psychosocial Evaluation - 06/02/16 1114      Psychosocial Evaluation & Interventions   Interventions Encouraged to exercise with the program and follow exercise prescription   Continued Psychosocial Services Needed No  QOL scores WNL: 29.23      Psychosocial Re-Evaluation:     Psychosocial Re-Evaluation    [place] Name 06/18/16 1044             Psychosocial Re-Evaluation   Interventions Encouraged to attend Cardiac Rehabilitation for the exercise       Comments Patient's QOL WNL at 29.23. Continues to have no psychosocial issues identified.        Continued Psychosocial Services Needed No          Vocational Rehabilitation: Provide vocational rehab assistance to qualifying candidates.   Vocational Rehab Evaluation & Intervention:     Vocational Rehab - 06/02/16 1106      Initial Vocational Rehab Evaluation & Intervention   Assessment shows need for Vocational Rehabilitation No      Education: Education Goals: Education classes will be provided on a weekly basis, covering required topics. Participant will state understanding/return demonstration of topics presented.  Learning Barriers/Preferences:     Learning Barriers/Preferences -  06/02/16 1106      Learning Barriers/Preferences   Learning Barriers None   Learning Preferences Written [place]      Education Topics: Hypertension, Hypertension Reduction -Define heart disease and high blood pressure. Discus how high blood pressure affects the body and ways to reduce  high blood pressure.   Exercise and Your Heart -Discuss why it is important to exercise, the FITT principles of exercise, normal and abnormal responses to exercise, and how to exercise safely.   Angina -Discuss definition of angina, causes of angina, treatment of angina, and how to decrease risk of having angina.   Cardiac Medications -Review what the following cardiac medications are used for, how they affect the body, and side effects that may occur when taking the medications.  Medications include Aspirin, Beta blockers, calcium channel blockers, ACE Inhibitors, angiotensin receptor blockers, diuretics, digoxin, and antihyperlipidemics.   Congestive Heart Failure -Discuss the definition of CHF, how to live with CHF, the signs and symptoms of CHF, and how keep track of weight and sodium intake.   Heart Disease and Intimacy -Discus the effect sexual activity has on the heart, how changes occur during intimacy as we age, and safety during sexual activity.   Smoking Cessation / COPD -Discuss different methods to quit smoking, the health benefits of quitting smoking, and the definition of COPD.   Nutrition I: Fats -Discuss the types of cholesterol, what cholesterol does to the heart, and how cholesterol levels can be controlled.   Nutrition II: Labels -Discuss the different components of food labels and how to read food label   Heart Parts and Heart Disease -Discuss the anatomy of the heart, the pathway of blood circulation through the heart, and these are affected by heart disease. Flowsheet Row CARDIAC REHAB PHASE II EXERCISE from 06/17/2016 in [place]  Date  06/10/16  Educator  Russella Dar  Instruction Review Code  2- meets goals/outcomes      Stress I: Signs and Symptoms -Discuss the causes of stress, how stress may lead to anxiety and depression, and ways to limit stress. Flowsheet Row CARDIAC REHAB PHASE II EXERCISE from 06/17/2016 in [place]  Date  06/17/16  Educator  [place]  Instruction Review Code  2- meets goals/outcomes      Stress II: Relaxation -Discuss different types of relaxation techniques to limit stress.   Warning Signs of Stroke / TIA -Discuss definition of a stroke, what the signs and symptoms are of a stroke, and how to identify when someone is having stroke.   Knowledge Questionnaire Score:     Knowledge Questionnaire Score - 06/02/16 1106      Knowledge Questionnaire Score   Pre Score 22/24      Core Components/Risk Factors/Patient Goals at Admission:     Personal Goals and Risk Factors at Admission - 06/02/16 1110      Core Components/Risk Factors/Patient Goals on Admission    Weight Management Weight Maintenance   Increase Strength and Stamina Yes   Intervention Provide advice, education, support and counseling about physical activity/exercise needs.;Develop an individualized exercise prescription for aerobic and resistive training based on initial evaluation findings, risk stratification, comorbidities and participant's personal goals.   Expected Outcomes Achievement of increased cardiorespiratory fitness and enhanced flexibility, muscular endurance and strength shown through measurements of functional capacity and personal statement of participant.   Personal Goal Other Yes   Personal Goal Get physically active again, gain some muscle back   Intervention Attend Cardiac Rehab  3xweek and to supplement exercise 2xweek at home.    Expected Outcomes Achieve personal goals.       Core Components/Risk Factors/Patient Goals Review:      Goals and Risk Factor Review    Row Name 06/02/16 1113 06/18/16 1042           Core Components/Risk Factors/Patient Goals Review   Personal Goals Review Weight Management/Obesity;Increase Strength and Stamina Weight Management/Obesity;Increase Strength and Stamina  Gain some muscle back; get physically active again.      Review  -  Patient has attended 5 sessions with some progression. Will continue to monitor for progress.      Expected Outcomes  - Patient will continue to attend sessions and complete the program meeting her personal goals.          Core Components/Risk Factors/Patient Goals at Discharge (Final Review):      Goals and Risk Factor Review - 06/18/16 1042      Core Components/Risk Factors/Patient Goals Review   Personal Goals Review Weight Management/Obesity;Increase Strength and Stamina  Gain some muscle back; get physically active again.   Review Patient has attended 5 sessions with some progression. Will continue to monitor for progress.   Expected Outcomes Patient will continue to attend sessions and complete the program meeting her personal goals.       ITP Comments:   Comments: ITP 30 Day REVIEW Patient doing well with program. Will continue to monitor for progress.

## 2016-06-19 ENCOUNTER — Encounter (HOSPITAL_COMMUNITY)
Admission: RE | Admit: 2016-06-19 | Discharge: 2016-06-19 | Disposition: A | Discharge status: Home / Self Care | Payer: Medicare HMO | Source: Ambulatory Visit | Attending: Cardiovascular Disease | Admitting: Cardiovascular Disease

## 2016-06-19 DIAGNOSIS — Z79899 Other long term (current) drug therapy: Secondary | ICD-10-CM | POA: Diagnosis not present

## 2016-06-19 DIAGNOSIS — Z951 Presence of aortocoronary bypass graft: Secondary | ICD-10-CM

## 2016-06-19 DIAGNOSIS — E119 Type 2 diabetes mellitus without complications: Secondary | ICD-10-CM | POA: Diagnosis not present

## 2016-06-19 DIAGNOSIS — Z7982 Long term (current) use of aspirin: Secondary | ICD-10-CM | POA: Diagnosis not present

## 2016-06-19 DIAGNOSIS — I2510 Atherosclerotic heart disease of native coronary artery without angina pectoris: Secondary | ICD-10-CM | POA: Diagnosis not present

## 2016-06-19 DIAGNOSIS — E785 Hyperlipidemia, unspecified: Secondary | ICD-10-CM | POA: Diagnosis not present

## 2016-06-19 DIAGNOSIS — E669 Obesity, unspecified: Secondary | ICD-10-CM | POA: Diagnosis not present

## 2016-06-19 DIAGNOSIS — Z7902 Long term (current) use of antithrombotics/antiplatelets: Secondary | ICD-10-CM | POA: Diagnosis not present

## 2016-06-19 DIAGNOSIS — I10 Essential (primary) hypertension: Secondary | ICD-10-CM | POA: Diagnosis not present

## 2016-06-19 NOTE — Progress Notes (Signed)
Daily Session Note  Patient Details  Name: Jonathan Shepard MRN: [id number] Date of Birth: 1950-12-05 Referring Provider:   Flowsheet Row CARDIAC REHAB PHASE II ORIENTATION from 06/02/2016 in [place]  Referring Provider  [name]      Encounter Date: 06/19/2016  Check In:     Session Check In - 06/19/16 0922      Check-In   Location [facility]   Staff Present Suzanne Boron, BS, EP, Exercise Physiologist;Shariq Puig Wynetta Emery, RN, BSN   Supervising physician immediately available to respond to emergencies See telemetry face sheet for immediately available MD   Medication changes reported     No   Fall or balance concerns reported    No   Warm-up and Cool-down Performed as group-led instruction   Resistance Training Performed Yes   VAD Patient? No     Pain Assessment   Currently in Pain? No/denies   Pain Score 0-No pain   Multiple Pain Sites No      Capillary Blood Glucose: No results found for this or any previous visit (from the past 24 hour(s)).   Goals Met:  Independence with exercise equipment Exercise tolerated well No report of cardiac concerns or symptoms Strength training completed today  Goals Unmet:  Not Applicable  Comments: Check out 1030.   [name] is Medical Director for [facility] Cardiac and Pulmonary Rehab.

## 2016-06-22 ENCOUNTER — Encounter (HOSPITAL_COMMUNITY)
Admission: RE | Admit: 2016-06-22 | Discharge: 2016-06-22 | Disposition: A | Discharge status: Home / Self Care | Payer: Medicare HMO | Source: Ambulatory Visit | Attending: Cardiovascular Disease | Admitting: Cardiovascular Disease

## 2016-06-22 DIAGNOSIS — E785 Hyperlipidemia, unspecified: Secondary | ICD-10-CM | POA: Diagnosis not present

## 2016-06-22 DIAGNOSIS — I2510 Atherosclerotic heart disease of native coronary artery without angina pectoris: Secondary | ICD-10-CM | POA: Diagnosis not present

## 2016-06-22 DIAGNOSIS — Z79899 Other long term (current) drug therapy: Secondary | ICD-10-CM | POA: Diagnosis not present

## 2016-06-22 DIAGNOSIS — Z951 Presence of aortocoronary bypass graft: Secondary | ICD-10-CM | POA: Diagnosis not present

## 2016-06-22 DIAGNOSIS — I10 Essential (primary) hypertension: Secondary | ICD-10-CM | POA: Diagnosis not present

## 2016-06-22 DIAGNOSIS — E119 Type 2 diabetes mellitus without complications: Secondary | ICD-10-CM | POA: Diagnosis not present

## 2016-06-22 DIAGNOSIS — E669 Obesity, unspecified: Secondary | ICD-10-CM | POA: Diagnosis not present

## 2016-06-22 DIAGNOSIS — Z7902 Long term (current) use of antithrombotics/antiplatelets: Secondary | ICD-10-CM | POA: Diagnosis not present

## 2016-06-22 DIAGNOSIS — Z7982 Long term (current) use of aspirin: Secondary | ICD-10-CM | POA: Diagnosis not present

## 2016-06-22 NOTE — Progress Notes (Signed)
Daily Session Note  Patient Details  Name: NUMA SCHROETER MRN: [id number] Date of Birth: 12-28-1950 Referring Provider:   Flowsheet Row CARDIAC REHAB PHASE II ORIENTATION from 06/02/2016 in [place]  Referring Provider  [name]      Encounter Date: 06/22/2016  Check In:     Session Check In - 06/22/16 0930      Check-In   Location [facility]   Staff Present Suzanne Boron, BS, EP, Exercise Physiologist;Janel Beane Wyn[place] Emery, RN, BSN   Supervising physician immediately available to respond to emergencies See telemetry face sheet for immediately available MD   Medication changes reported     No   Fall or balance concerns reported    No   Warm-up and Cool-down Performed as group-led instruction   Resistance Training Performed Yes   VAD Patient? No     Pain Assessment   Currently in Pain? No/denies   Pain Score 0-No pain   Multiple Pain Sites No      Capillary Blood Glucose: No results found for this or any previous visit (from the past 24 hour(s)).   Goals Met:  Independence with exercise equipment Exercise tolerated well No report of cardiac concerns or symptoms Strength training completed today  Goals Unmet:  Not Applicable  Comments: Check out 1030.   [name] is Medical Director for [facility] Cardiac and Pulmonary Rehab.

## 2016-06-24 ENCOUNTER — Encounter (HOSPITAL_COMMUNITY)
Admission: RE | Admit: 2016-06-24 | Discharge: 2016-06-24 | Disposition: A | Discharge status: Home / Self Care | Payer: Medicare HMO | Source: Ambulatory Visit | Attending: Cardiovascular Disease | Admitting: Cardiovascular Disease

## 2016-06-24 DIAGNOSIS — Z951 Presence of aortocoronary bypass graft: Secondary | ICD-10-CM | POA: Diagnosis not present

## 2016-06-24 DIAGNOSIS — E669 Obesity, unspecified: Secondary | ICD-10-CM | POA: Diagnosis not present

## 2016-06-24 DIAGNOSIS — I10 Essential (primary) hypertension: Secondary | ICD-10-CM | POA: Diagnosis not present

## 2016-06-24 DIAGNOSIS — E119 Type 2 diabetes mellitus without complications: Secondary | ICD-10-CM | POA: Diagnosis not present

## 2016-06-24 DIAGNOSIS — Z7982 Long term (current) use of aspirin: Secondary | ICD-10-CM | POA: Diagnosis not present

## 2016-06-24 DIAGNOSIS — I2510 Atherosclerotic heart disease of native coronary artery without angina pectoris: Secondary | ICD-10-CM | POA: Diagnosis not present

## 2016-06-24 DIAGNOSIS — Z7902 Long term (current) use of antithrombotics/antiplatelets: Secondary | ICD-10-CM | POA: Diagnosis not present

## 2016-06-24 DIAGNOSIS — E785 Hyperlipidemia, unspecified: Secondary | ICD-10-CM | POA: Diagnosis not present

## 2016-06-24 DIAGNOSIS — Z79899 Other long term (current) drug therapy: Secondary | ICD-10-CM | POA: Diagnosis not present

## 2016-06-24 NOTE — Progress Notes (Signed)
Daily Session Note  Patient Details  Name: Jonathan Shepard MRN: [id number] Date of Birth: 10-Oct-1950 Referring Provider:   Flowsheet Row CARDIAC REHAB PHASE II ORIENTATION from 06/02/2016 in [place]  Referring Provider  [name]      Encounter Date: 06/24/2016  Check In:     Session Check In - 06/24/16 0922      Check-In   Location [facility]   Staff Present Suzanne Boron, BS, EP, Exercise Physiologist;Debra Wynetta Emery, RN, BSN   Supervising physician immediately available to respond to emergencies See telemetry face sheet for immediately available MD   Medication changes reported     No   Fall or balance concerns reported    No   Warm-up and Cool-down Performed as group-led instruction   Resistance Training Performed Yes   VAD Patient? No     Pain Assessment   Currently in Pain? No/denies   Pain Score 0-No pain   Multiple Pain Sites No      Capillary Blood Glucose: No results found for this or any previous visit (from the past 24 hour(s)).   Goals Met:  Independence with exercise equipment Exercise tolerated well No report of cardiac concerns or symptoms Strength training completed today  Goals Unmet:  Not Applicable  Comments: Check out 1030   [name] is Medical Director for [place] and Pulmonary Rehab.

## 2016-06-26 ENCOUNTER — Encounter (HOSPITAL_COMMUNITY)
Admission: RE | Admit: 2016-06-26 | Discharge: 2016-06-26 | Disposition: A | Discharge status: Home / Self Care | Payer: Medicare HMO | Source: Ambulatory Visit | Attending: Cardiovascular Disease | Admitting: Cardiovascular Disease

## 2016-06-26 DIAGNOSIS — Z951 Presence of aortocoronary bypass graft: Secondary | ICD-10-CM | POA: Diagnosis not present

## 2016-06-26 DIAGNOSIS — I2510 Atherosclerotic heart disease of native coronary artery without angina pectoris: Secondary | ICD-10-CM | POA: Diagnosis not present

## 2016-06-26 DIAGNOSIS — E785 Hyperlipidemia, unspecified: Secondary | ICD-10-CM | POA: Diagnosis not present

## 2016-06-26 DIAGNOSIS — Z79899 Other long term (current) drug therapy: Secondary | ICD-10-CM | POA: Diagnosis not present

## 2016-06-26 DIAGNOSIS — E119 Type 2 diabetes mellitus without complications: Secondary | ICD-10-CM | POA: Diagnosis not present

## 2016-06-26 DIAGNOSIS — Z7982 Long term (current) use of aspirin: Secondary | ICD-10-CM | POA: Diagnosis not present

## 2016-06-26 DIAGNOSIS — E669 Obesity, unspecified: Secondary | ICD-10-CM | POA: Diagnosis not present

## 2016-06-26 DIAGNOSIS — I10 Essential (primary) hypertension: Secondary | ICD-10-CM | POA: Diagnosis not present

## 2016-06-26 DIAGNOSIS — Z7902 Long term (current) use of antithrombotics/antiplatelets: Secondary | ICD-10-CM | POA: Diagnosis not present

## 2016-06-26 NOTE — Progress Notes (Signed)
Daily Session Note  Patient Details  Name: Jonathan Shepard MRN: [id number] Date of Birth: Jan 04, 1951 Referring Provider:   Flowsheet Row CARDIAC REHAB PHASE II ORIENTATION from 06/02/2016 in [place]  Referring Provider  [name]      Encounter Date: 06/26/2016  Check In:     Session Check In - 06/26/16 0930      Check-In   Location [facility]   Staff Present Russella Dar, MS, EP, [facility], Exercise Physiologist;Jarrah Babich Luther Parody, BS, EP, Exercise Physiologist   Supervising physician immediately available to respond to emergencies See telemetry face sheet for immediately available MD   Medication changes reported     No   Fall or balance concerns reported    No   Warm-up and Cool-down Performed as group-led instruction   Resistance Training Performed Yes   VAD Patient? No     Pain Assessment   Currently in Pain? No/denies   Pain Score 0-No pain   Multiple Pain Sites No      Capillary Blood Glucose: No results found for this or any previous visit (from the past 24 hour(s)).   Goals Met:  Independence with exercise equipment Exercise tolerated well No report of cardiac concerns or symptoms Strength training completed today  Goals Unmet:  Not Applicable  Comments: Check out 1030   [name] is Medical Director for [place] and Pulmonary Rehab.

## 2016-06-27 ENCOUNTER — Other Ambulatory Visit: Payer: Self-pay | Admitting: Physician Assistant

## 2016-06-29 ENCOUNTER — Encounter (HOSPITAL_COMMUNITY)
Admission: RE | Admit: 2016-06-29 | Discharge: 2016-06-29 | Disposition: A | Discharge status: Home / Self Care | Payer: Medicare HMO | Source: Ambulatory Visit | Attending: Cardiovascular Disease | Admitting: Cardiovascular Disease

## 2016-06-29 DIAGNOSIS — I2510 Atherosclerotic heart disease of native coronary artery without angina pectoris: Secondary | ICD-10-CM | POA: Diagnosis not present

## 2016-06-29 DIAGNOSIS — Z7982 Long term (current) use of aspirin: Secondary | ICD-10-CM | POA: Diagnosis not present

## 2016-06-29 DIAGNOSIS — I10 Essential (primary) hypertension: Secondary | ICD-10-CM | POA: Diagnosis not present

## 2016-06-29 DIAGNOSIS — E785 Hyperlipidemia, unspecified: Secondary | ICD-10-CM | POA: Diagnosis not present

## 2016-06-29 DIAGNOSIS — E669 Obesity, unspecified: Secondary | ICD-10-CM | POA: Diagnosis not present

## 2016-06-29 DIAGNOSIS — Z951 Presence of aortocoronary bypass graft: Secondary | ICD-10-CM

## 2016-06-29 DIAGNOSIS — Z7902 Long term (current) use of antithrombotics/antiplatelets: Secondary | ICD-10-CM | POA: Diagnosis not present

## 2016-06-29 DIAGNOSIS — E119 Type 2 diabetes mellitus without complications: Secondary | ICD-10-CM | POA: Diagnosis not present

## 2016-06-29 DIAGNOSIS — Z79899 Other long term (current) drug therapy: Secondary | ICD-10-CM | POA: Diagnosis not present

## 2016-06-29 MED ORDER — OXYCODONE HCL 5 MG PO TABS: ORAL_TABLET | ORAL | 0 refills | Status: DC

## 2016-06-29 NOTE — Progress Notes (Signed)
Daily Session Note  Patient Details  Name: Jonathan Shepard MRN: [id number] Date of Birth: 1950/08/15 Referring Provider:   Flowsheet Row CARDIAC REHAB PHASE II ORIENTATION from 06/02/2016 in [place]  Referring Provider  [name]      Encounter Date: 06/29/2016  Check In:     Session Check In - 06/29/16 0930      Check-In   Location [facility]   Staff Present Suzanne Boron, BS, EP, Exercise Physiologist;Robertine Kipper Wyn[place] Emery, RN, BSN   Supervising physician immediately available to respond to emergencies See telemetry face sheet for immediately available MD   Medication changes reported     No   Fall or balance concerns reported    No   Warm-up and Cool-down Performed as group-led instruction   Resistance Training Performed Yes   VAD Patient? No     Pain Assessment   Currently in Pain? No/denies   Pain Score 0-No pain   Multiple Pain Sites No      Capillary Blood Glucose: No results found for this or any previous visit (from the past 24 hour(s)).   Goals Met:  Independence with exercise equipment Exercise tolerated well No report of cardiac concerns or symptoms Strength training completed today  Goals Unmet:  Not Applicable  Comments: Check out 1030.   [name] is Medical Director for [facility] Cardiac and Pulmonary Rehab.

## 2016-07-01 ENCOUNTER — Encounter (HOSPITAL_COMMUNITY)
Admission: RE | Admit: 2016-07-01 | Discharge: 2016-07-01 | Disposition: A | Discharge status: Home / Self Care | Payer: Medicare HMO | Source: Ambulatory Visit | Attending: Cardiovascular Disease | Admitting: Cardiovascular Disease

## 2016-07-01 DIAGNOSIS — E669 Obesity, unspecified: Secondary | ICD-10-CM | POA: Diagnosis not present

## 2016-07-01 DIAGNOSIS — Z79899 Other long term (current) drug therapy: Secondary | ICD-10-CM | POA: Diagnosis not present

## 2016-07-01 DIAGNOSIS — Z951 Presence of aortocoronary bypass graft: Secondary | ICD-10-CM

## 2016-07-01 DIAGNOSIS — I2510 Atherosclerotic heart disease of native coronary artery without angina pectoris: Secondary | ICD-10-CM | POA: Diagnosis not present

## 2016-07-01 DIAGNOSIS — I10 Essential (primary) hypertension: Secondary | ICD-10-CM | POA: Diagnosis not present

## 2016-07-01 DIAGNOSIS — Z7902 Long term (current) use of antithrombotics/antiplatelets: Secondary | ICD-10-CM | POA: Diagnosis not present

## 2016-07-01 DIAGNOSIS — E785 Hyperlipidemia, unspecified: Secondary | ICD-10-CM | POA: Diagnosis not present

## 2016-07-01 DIAGNOSIS — E119 Type 2 diabetes mellitus without complications: Secondary | ICD-10-CM | POA: Diagnosis not present

## 2016-07-01 DIAGNOSIS — Z7982 Long term (current) use of aspirin: Secondary | ICD-10-CM | POA: Diagnosis not present

## 2016-07-01 NOTE — Progress Notes (Signed)
Daily Session Note  Patient Details  Name: BRADDOCK SERVELLON MRN: [id number] Date of Birth: 1950/10/19 Referring Provider:   Flowsheet Row CARDIAC REHAB PHASE II ORIENTATION from 06/02/2016 in [place]  Referring Provider  [name]      Encounter Date: 07/01/2016  Check In:     Session Check In - 07/01/16 0930      Check-In   Location [facility]   Staff Present Suzanne Boron, BS, EP, Exercise Physiologist;Natlie Asfour Wynetta Emery, RN, BSN   Supervising physician immediately available to respond to emergencies See telemetry face sheet for immediately available MD   Medication changes reported     No   Fall or balance concerns reported    No   Warm-up and Cool-down Performed as group-led instruction   Resistance Training Performed Yes   VAD Patient? No     Pain Assessment   Currently in Pain? No/denies   Pain Score 0-No pain   Multiple Pain Sites No      Capillary Blood Glucose: No results found for this or any previous visit (from the past 24 hour(s)).   Goals Met:  Independence with exercise equipment Exercise tolerated well No report of cardiac concerns or symptoms Strength training completed today  Goals Unmet:  Not Applicable  Comments: Check out 1030.   [name] is Medical Director for [facility] Cardiac and Pulmonary Rehab.

## 2016-07-03 ENCOUNTER — Encounter (HOSPITAL_COMMUNITY)
Admission: RE | Admit: 2016-07-03 | Discharge: 2016-07-03 | Disposition: A | Discharge status: Home / Self Care | Payer: Medicare HMO | Source: Ambulatory Visit | Attending: Cardiovascular Disease | Admitting: Cardiovascular Disease

## 2016-07-03 DIAGNOSIS — Z7982 Long term (current) use of aspirin: Secondary | ICD-10-CM | POA: Diagnosis not present

## 2016-07-03 DIAGNOSIS — I2510 Atherosclerotic heart disease of native coronary artery without angina pectoris: Secondary | ICD-10-CM | POA: Diagnosis not present

## 2016-07-03 DIAGNOSIS — E119 Type 2 diabetes mellitus without complications: Secondary | ICD-10-CM | POA: Diagnosis not present

## 2016-07-03 DIAGNOSIS — E785 Hyperlipidemia, unspecified: Secondary | ICD-10-CM | POA: Diagnosis not present

## 2016-07-03 DIAGNOSIS — Z951 Presence of aortocoronary bypass graft: Secondary | ICD-10-CM

## 2016-07-03 DIAGNOSIS — Z79899 Other long term (current) drug therapy: Secondary | ICD-10-CM | POA: Diagnosis not present

## 2016-07-03 DIAGNOSIS — I10 Essential (primary) hypertension: Secondary | ICD-10-CM | POA: Diagnosis not present

## 2016-07-03 DIAGNOSIS — E669 Obesity, unspecified: Secondary | ICD-10-CM | POA: Diagnosis not present

## 2016-07-03 DIAGNOSIS — Z7902 Long term (current) use of antithrombotics/antiplatelets: Secondary | ICD-10-CM | POA: Diagnosis not present

## 2016-07-03 NOTE — Progress Notes (Signed)
Daily Session Note  Patient Details  Name: Jonathan Shepard MRN: [id number] Date of Birth: 07/21/1950 Referring Provider:   Flowsheet Row CARDIAC REHAB PHASE II ORIENTATION from 06/02/2016 in [place] [facility]  Referring Provider  Dr. Croitoru      Encounter Date: 07/03/2016  Check In:     Session Check In - 07/03/16 0930      Check-In   Location [facility]   Staff Present Gregory Cowan, BS, EP, Exercise Physiologist;Debra Johnson, RN, BSN   Supervising physician immediately available to respond to emergencies See telemetry face sheet for immediately available MD   Medication changes reported     No   Fall or balance concerns reported    No   Warm-up and Cool-down Performed as group-led instruction   Resistance Training Performed Yes   VAD Patient? No     Pain Assessment   Currently in Pain? No/denies   Pain Score 0-No pain   Multiple Pain Sites No      Capillary Blood Glucose: No results found for this or any previous visit (from the past 24 hour(s)).   Goals Met:  Independence with exercise equipment Exercise tolerated well No report of cardiac concerns or symptoms Strength training completed today  Goals Unmet:  Not Applicable  Comments: Check out 1030.   Dr. Suresh Koneswaran is Medical Director for [facility]. 

## 2016-07-06 ENCOUNTER — Encounter (HOSPITAL_COMMUNITY)
Admission: RE | Admit: 2016-07-06 | Discharge: 2016-07-06 | Disposition: A | Discharge status: Home / Self Care | Payer: Medicare HMO | Source: Ambulatory Visit | Attending: Cardiovascular Disease | Admitting: Cardiovascular Disease

## 2016-07-06 DIAGNOSIS — Z79899 Other long term (current) drug therapy: Secondary | ICD-10-CM | POA: Diagnosis not present

## 2016-07-06 DIAGNOSIS — Z951 Presence of aortocoronary bypass graft: Secondary | ICD-10-CM | POA: Diagnosis not present

## 2016-07-06 DIAGNOSIS — I2510 Atherosclerotic heart disease of native coronary artery without angina pectoris: Secondary | ICD-10-CM | POA: Diagnosis not present

## 2016-07-06 DIAGNOSIS — Z7902 Long term (current) use of antithrombotics/antiplatelets: Secondary | ICD-10-CM | POA: Diagnosis not present

## 2016-07-06 DIAGNOSIS — E119 Type 2 diabetes mellitus without complications: Secondary | ICD-10-CM | POA: Diagnosis not present

## 2016-07-06 DIAGNOSIS — Z7982 Long term (current) use of aspirin: Secondary | ICD-10-CM | POA: Diagnosis not present

## 2016-07-06 DIAGNOSIS — E785 Hyperlipidemia, unspecified: Secondary | ICD-10-CM | POA: Diagnosis not present

## 2016-07-06 DIAGNOSIS — E669 Obesity, unspecified: Secondary | ICD-10-CM | POA: Diagnosis not present

## 2016-07-06 DIAGNOSIS — I10 Essential (primary) hypertension: Secondary | ICD-10-CM | POA: Diagnosis not present

## 2016-07-06 NOTE — Progress Notes (Signed)
Daily Session Note  Patient Details  Name: Jonathan Shepard MRN: [id number] Date of Birth: Feb 23, 1951 Referring Provider:   Flowsheet Row CARDIAC REHAB PHASE II ORIENTATION from 06/02/2016 in [place]  Referring Provider  [name]      Encounter Date: 07/06/2016  Check In:     Session Check In - 07/06/16 0930      Check-In   Location [facility]   Staff Present Suzanne Boron, BS, EP, Exercise Physiologist;Mayana Irigoyen Wynetta Emery, RN, BSN   Supervising physician immediately available to respond to emergencies See telemetry face sheet for immediately available MD   Medication changes reported     No   Warm-up and Cool-down Performed as group-led instruction   Resistance Training Performed Yes   VAD Patient? No     Pain Assessment   Currently in Pain? No/denies   Pain Score 0-No pain   Multiple Pain Sites No      Capillary Blood Glucose: No results found for this or any previous visit (from the past 24 hour(s)).   Goals Met:  Independence with exercise equipment Exercise tolerated well No report of cardiac concerns or symptoms Strength training completed today  Goals Unmet:  Not Applicable  Comments: Check out 1030.   [name] is Medical Director for [facility] Cardiac and Pulmonary Rehab.

## 2016-07-08 ENCOUNTER — Encounter (HOSPITAL_COMMUNITY): Admission: RE | Admit: 2016-07-08 | Payer: Medicare HMO | Source: Ambulatory Visit

## 2016-07-08 DIAGNOSIS — Z6830 Body mass index (BMI) 30.0-30.9, adult: Secondary | ICD-10-CM | POA: Diagnosis not present

## 2016-07-08 DIAGNOSIS — G894 Chronic pain syndrome: Secondary | ICD-10-CM | POA: Diagnosis not present

## 2016-07-08 DIAGNOSIS — E1129 Type 2 diabetes mellitus with other diabetic kidney complication: Secondary | ICD-10-CM | POA: Diagnosis not present

## 2016-07-08 DIAGNOSIS — M1991 Primary osteoarthritis, unspecified site: Secondary | ICD-10-CM | POA: Diagnosis not present

## 2016-07-10 ENCOUNTER — Encounter (HOSPITAL_COMMUNITY)
Admission: RE | Admit: 2016-07-10 | Discharge: 2016-07-10 | Disposition: A | Discharge status: Home / Self Care | Payer: Medicare HMO | Source: Ambulatory Visit | Attending: Cardiovascular Disease | Admitting: Cardiovascular Disease

## 2016-07-10 DIAGNOSIS — Z7902 Long term (current) use of antithrombotics/antiplatelets: Secondary | ICD-10-CM | POA: Insufficient documentation

## 2016-07-10 DIAGNOSIS — Z79899 Other long term (current) drug therapy: Secondary | ICD-10-CM | POA: Insufficient documentation

## 2016-07-10 DIAGNOSIS — E669 Obesity, unspecified: Secondary | ICD-10-CM | POA: Diagnosis not present

## 2016-07-10 DIAGNOSIS — I2510 Atherosclerotic heart disease of native coronary artery without angina pectoris: Secondary | ICD-10-CM | POA: Diagnosis not present

## 2016-07-10 DIAGNOSIS — I10 Essential (primary) hypertension: Secondary | ICD-10-CM | POA: Diagnosis not present

## 2016-07-10 DIAGNOSIS — Z7984 Long term (current) use of oral hypoglycemic drugs: Secondary | ICD-10-CM | POA: Insufficient documentation

## 2016-07-10 DIAGNOSIS — E119 Type 2 diabetes mellitus without complications: Secondary | ICD-10-CM | POA: Insufficient documentation

## 2016-07-10 DIAGNOSIS — Z7982 Long term (current) use of aspirin: Secondary | ICD-10-CM | POA: Diagnosis not present

## 2016-07-10 DIAGNOSIS — E785 Hyperlipidemia, unspecified: Secondary | ICD-10-CM | POA: Diagnosis not present

## 2016-07-10 DIAGNOSIS — Z951 Presence of aortocoronary bypass graft: Secondary | ICD-10-CM | POA: Diagnosis not present

## 2016-07-10 NOTE — Progress Notes (Signed)
Daily Session Note  Patient Details  Name: Jonathan Shepard MRN: [id number] Date of Birth: 13-Apr-1951 Referring Provider:   Flowsheet Row CARDIAC REHAB PHASE II ORIENTATION from 06/02/2016 in [place]  Referring Provider  [name]      Encounter Date: 07/10/2016  Check In:     Session Check In - 07/10/16 0930      Check-In   Location [facility]   Staff Present Suzanne Boron, BS, EP, Exercise Physiologist;Abrahim Sargent Wynetta Emery, RN, BSN   Supervising physician immediately available to respond to emergencies See telemetry face sheet for immediately available MD   Medication changes reported     No   Fall or balance concerns reported    No   Warm-up and Cool-down Performed as group-led instruction   Resistance Training Performed Yes   VAD Patient? No     Pain Assessment   Currently in Pain? No/denies   Pain Score 0-No pain   Multiple Pain Sites No      Capillary Blood Glucose: No results found for this or any previous visit (from the past 24 hour(s)).   Goals Met:  Independence with exercise equipment Exercise tolerated well No report of cardiac concerns or symptoms Strength training completed today  Goals Unmet:  Not Applicable  Comments: Check out 1030.   [name] is Medical Director for [facility] Cardiac and Pulmonary Rehab.

## 2016-07-13 ENCOUNTER — Encounter (HOSPITAL_COMMUNITY)
Admission: RE | Admit: 2016-07-13 | Discharge: 2016-07-13 | Disposition: A | Discharge status: Home / Self Care | Payer: Medicare HMO | Source: Ambulatory Visit | Attending: Cardiovascular Disease | Admitting: Cardiovascular Disease

## 2016-07-13 DIAGNOSIS — E669 Obesity, unspecified: Secondary | ICD-10-CM | POA: Diagnosis not present

## 2016-07-13 DIAGNOSIS — Z951 Presence of aortocoronary bypass graft: Secondary | ICD-10-CM

## 2016-07-13 DIAGNOSIS — Z7982 Long term (current) use of aspirin: Secondary | ICD-10-CM | POA: Diagnosis not present

## 2016-07-13 DIAGNOSIS — E119 Type 2 diabetes mellitus without complications: Secondary | ICD-10-CM | POA: Diagnosis not present

## 2016-07-13 DIAGNOSIS — I2510 Atherosclerotic heart disease of native coronary artery without angina pectoris: Secondary | ICD-10-CM | POA: Diagnosis not present

## 2016-07-13 DIAGNOSIS — E785 Hyperlipidemia, unspecified: Secondary | ICD-10-CM | POA: Diagnosis not present

## 2016-07-13 DIAGNOSIS — I10 Essential (primary) hypertension: Secondary | ICD-10-CM | POA: Diagnosis not present

## 2016-07-13 DIAGNOSIS — Z79899 Other long term (current) drug therapy: Secondary | ICD-10-CM | POA: Diagnosis not present

## 2016-07-13 DIAGNOSIS — Z7902 Long term (current) use of antithrombotics/antiplatelets: Secondary | ICD-10-CM | POA: Diagnosis not present

## 2016-07-13 NOTE — Progress Notes (Signed)
Daily Session Note  Patient Details  Name: FAUSTINO LUECKE MRN: [id number] Date of Birth: Oct 13, 1950 Referring Provider:   Flowsheet Row CARDIAC REHAB PHASE II ORIENTATION from 06/02/2016 in [place]  Referring Provider  [name]      Encounter Date: 07/13/2016  Check In:     Session Check In - 07/13/16 0930      Check-In   Location [facility]   Staff Present Suzanne Boron, BS, EP, Exercise Physiologist;Aftyn Nott Wynetta Emery, RN, BSN   Supervising physician immediately available to respond to emergencies See telemetry face sheet for immediately available MD   Medication changes reported     No   Fall or balance concerns reported    No   Warm-up and Cool-down Performed as group-led instruction   Resistance Training Performed Yes   VAD Patient? No     Pain Assessment   Currently in Pain? No/denies   Pain Score 0-No pain   Multiple Pain Sites No      Capillary Blood Glucose: No results found for this or any previous visit (from the past 24 hour(s)).   Goals Met:  Independence with exercise equipment Exercise tolerated well No report of cardiac concerns or symptoms Strength training completed today  Goals Unmet:  Not Applicable  Comments: Check out 1030.   [name] is Medical Director for [facility] Cardiac and Pulmonary Rehab.

## 2016-07-15 ENCOUNTER — Encounter (HOSPITAL_COMMUNITY)
Admission: RE | Admit: 2016-07-15 | Discharge: 2016-07-15 | Disposition: A | Discharge status: Home / Self Care | Payer: Medicare HMO | Source: Ambulatory Visit | Attending: Cardiovascular Disease | Admitting: Cardiovascular Disease

## 2016-07-15 DIAGNOSIS — Z7982 Long term (current) use of aspirin: Secondary | ICD-10-CM | POA: Diagnosis not present

## 2016-07-15 DIAGNOSIS — I10 Essential (primary) hypertension: Secondary | ICD-10-CM | POA: Diagnosis not present

## 2016-07-15 DIAGNOSIS — Z79899 Other long term (current) drug therapy: Secondary | ICD-10-CM | POA: Diagnosis not present

## 2016-07-15 DIAGNOSIS — Z951 Presence of aortocoronary bypass graft: Secondary | ICD-10-CM

## 2016-07-15 DIAGNOSIS — Z7902 Long term (current) use of antithrombotics/antiplatelets: Secondary | ICD-10-CM | POA: Diagnosis not present

## 2016-07-15 DIAGNOSIS — I2510 Atherosclerotic heart disease of native coronary artery without angina pectoris: Secondary | ICD-10-CM | POA: Diagnosis not present

## 2016-07-15 DIAGNOSIS — E669 Obesity, unspecified: Secondary | ICD-10-CM | POA: Diagnosis not present

## 2016-07-15 DIAGNOSIS — E785 Hyperlipidemia, unspecified: Secondary | ICD-10-CM | POA: Diagnosis not present

## 2016-07-15 DIAGNOSIS — E119 Type 2 diabetes mellitus without complications: Secondary | ICD-10-CM | POA: Diagnosis not present

## 2016-07-15 NOTE — Progress Notes (Signed)
Cardiac Individual Treatment Plan  Patient Details  Name: Jonathan Shepard MRN: [id number] Date of Birth: 1951/04/08 Referring Provider:   Flowsheet Row CARDIAC REHAB PHASE II ORIENTATION from 06/02/2016 in [place]  Referring Provider  [name]      Initial Encounter Date:  Flowsheet Row CARDIAC REHAB PHASE II ORIENTATION from 06/02/2016 in [place]  Date  06/02/16  Referring Provider  [name]      Visit Diagnosis: S/P CABG x 4  Patient's Home Medications on Admission:  Current Outpatient Prescriptions:  .  aspirin EC 81 MG tablet, Take 81 mg by mouth at bedtime. , Disp: , Rfl:  .  Choline Fenofibrate (FENOFIBRIC ACID) 45 MG CPDR, TAKE 1 CAPSULE ONE TIME DAILY (Patient taking differently: take 45mg s at bedtime), Disp: 90 capsule, Rfl: 2 .  clopidogrel (PLAVIX) 75 MG tablet, TAKE 1 TABLET DAILY (NEEDS APPOINTMENT FOR REFILLS.), Disp: 90 tablet, Rfl: 2 .  Coenzyme Q10 (CO Q10 PO), Take 30 mg by mouth daily. , Disp: , Rfl:  .  diclofenac (VOLTAREN) 75 MG EC tablet, Take 75 mg by mouth 2 (two) times daily as needed for moderate pain., Disp: , Rfl:  .  glimepiride (AMARYL) 4 MG tablet, Take 4 mg by mouth 2 (two) times daily. , Disp: , Rfl:  .  HYDROcodone-acetaminophen (NORCO) 10-325 MG tablet, Take 1 tablet by mouth every 6 (six) hours as needed for moderate pain or severe pain., Disp: , Rfl:  .  isosorbide mononitrate (IMDUR) 30 MG 24 hr tablet, Take 1 tablet (30 mg total) by mouth daily. For one month then stop. (Patient not taking: Reported on 06/02/2016), Disp: 30 tablet, Rfl: 0 .  metFORMIN (GLUCOPHAGE) 1000 MG tablet, Take 1,000 mg by mouth 2 (two) times daily with a meal., Disp: , Rfl:  .  metoprolol tartrate (LOPRESSOR) 25 MG tablet, Take 1 tablet (25 mg total) by mouth 2 (two) times daily., Disp: 60 tablet, Rfl: 1 .  Multiple Vitamin (MULTIVITAMIN) tablet, Take 1 tablet by mouth daily., Disp: , Rfl:  .  Omega-3 Fatty  Acids (FISH OIL) 1200 MG CAPS, Take 1 capsule by mouth 2 (two) times daily. , Disp: , Rfl:  .  oxyCODONE (OXY IR/ROXICODONE) 5 MG immediate release tablet, Take 5 mg by mouth every 4-6 hours PRN severe pain., Disp: 20 tablet, Rfl: 0 .  pravastatin (PRAVACHOL) 80 MG tablet, TAKE 1 TABLET EVERY DAY (Patient taking differently: take 80mg s at bedtime), Disp: 90 tablet, Rfl: 2 .  ramipril (ALTACE) 2.5 MG capsule, Take 1 capsule (2.5 mg total) by mouth daily., Disp: , Rfl:   Past Medical History: Past Medical History:  Diagnosis Date  . CAD (coronary artery disease)   . Diabetes ([place])   . Dyspnea   . Hyperlipidemia   . Hypertension   . Obesity     Tobacco Use: History  Smoking Status  . Never Smoker  Smokeless Tobacco  . Never Used    Labs: Recent Review Flowsheet Data    Labs for ITP Cardiac and Pulmonary Rehab Latest Ref Rng & Units 04/22/2016 04/23/2016 04/23/2016 04/23/2016 04/23/2016   Cholestrol 125 - 200 mg/dL - - - - -   LDLCALC <130 mg/dL - - - - -   HDL >=40 mg/dL - - - - -   Trlycerides <150 mg/dL - - - - -   Hemoglobin A1c 4.8 - 5.6 % - - - - -   PHART 7.350 - 7.450 7.338(L) 7.330(L) -  7.358 -   PCO2ART 32.0 - 48.0 mmHg 41.8 42.0 - 38.9 -   HCO3 20.0 - 28.0 mmol/L 22.5 22.1 - 21.9 -   TCO2 0 - 100 mmol/L 24 23 23 23 22    ACIDBASEDEF 0.0 - 2.0 mmol/L 3.0(H) 4.0(H) - 3.0(H) -   O2SAT % 90.0 97.0 - 90.0 -      Capillary Blood Glucose: Lab Results  Component Value Date   GLUCAP 135 (H) 04/27/2016   GLUCAP 136 (H) 04/27/2016   GLUCAP 129 (H) 04/26/2016   GLUCAP 102 (H) 04/26/2016   GLUCAP 150 (H) 04/26/2016     Exercise Target Goals:    Exercise Program Goal: Individual exercise prescription set with THRR, safety & activity barriers. Participant demonstrates ability to understand and report RPE using BORG scale, to self-measure pulse accurately, and to acknowledge the importance of the exercise prescription.  Exercise Prescription Goal: Starting with  aerobic activity 30 plus minutes a day, 3 days per week for initial exercise prescription. Provide home exercise prescription and guidelines that participant acknowledges understanding prior to discharge.  Activity Barriers & Risk Stratification:     Activity Barriers & Cardiac Risk Stratification - 06/02/16 1105      Activity Barriers & Cardiac Risk Stratification   Activity Barriers Arthritis   Cardiac Risk Stratification High      6 Minute Walk:     6 Minute Walk    Row Name 06/02/16 0959         6 Minute Walk   Phase Initial     Distance 1400 feet     Distance % Change 0 %     Walk Time 6 minutes     # of Rest Breaks 0     MPH 2.65     METS 3.03     RPE 9     Perceived Dyspnea  11     VO2 Peak 12.71     Symptoms No     Resting HR 99 bpm     Resting BP 126/76     Max Ex. HR 111 bpm     Max Ex. BP 138/80     2 Minute Post BP 128/78        Initial Exercise Prescription:     Initial Exercise Prescription - 06/02/16 1000      Date of Initial Exercise RX and Referring Provider   Date 06/02/16   Referring Provider [name]     Treadmill   MPH 1.3   Grade 0   Minutes 15   METs 1.9     NuStep   Level 2   Watts 20   Minutes 20   METs 2     Prescription Details   Frequency (times per week) 3   Duration Progress to 30 minutes of continuous aerobic without signs/symptoms of physical distress     Intensity   THRR REST +  30   THRR 40-80% of Max Heartrate [phone]   Ratings of Perceived Exertion 11-13   Perceived Dyspnea 0-4     Progression   Progression Continue progressive overload as per policy without signs/symptoms or physical distress.     Resistance Training   Training Prescription Yes   Weight 1   Reps 10-12      Perform Capillary Blood Glucose checks as needed.  Exercise Prescription Changes:      Exercise Prescription Changes    Row Name 06/15/16 1400 07/13/16 1400  Exercise Review   Progression Yes Yes         Response to Exercise   Blood Pressure (Admit) 150/78 126/70      Blood Pressure (Exercise) 146/70 130/80      Blood Pressure (Exit) 118/62 112/62      Heart Rate (Admit) 77 bpm 87 bpm      Heart Rate (Exercise) 100 bpm 109 bpm      Heart Rate (Exit) 86 bpm 94 bpm      Rating of Perceived Exertion (Exercise) 7 8      Duration Progress to 30 minutes of continuous aerobic without signs/symptoms of physical distress Progress to 30 minutes of continuous aerobic without signs/symptoms of physical distress      Intensity Rest + 30 Rest + 30        Progression   Progression Continue progressive overload as per policy without signs/symptoms or physical distress. Continue progressive overload as per policy without signs/symptoms or physical distress.        Resistance Training   Training Prescription Yes Yes      Weight 2 3      Reps 10-12 10-12        Treadmill   MPH 2 2.7      Grade 0 0      Minutes 15 15      METs 2.5 3        NuStep   Level 2 3      Watts 11 37      Minutes 20 20      METs 3.65 3.65        Home Exercise Plan   Plans to continue exercise at [place] 2 additional days to program exercise sessions. Add 2 additional days to program exercise sessions.         Exercise Comments:      Exercise Comments    Row Name 06/15/16 1448 07/13/16 1435         Exercise Comments Patient is progressing well. Patient is proggressing well          Discharge Exercise Prescription (Final Exercise Prescription Changes):     Exercise Prescription Changes - 07/13/16 1400      Exercise Review   Progression Yes     Response to Exercise   Blood Pressure (Admit) 126/70   Blood Pressure (Exercise) 130/80   Blood Pressure (Exit) 112/62   Heart Rate (Admit) 87 bpm   Heart Rate (Exercise) 109 bpm   Heart Rate (Exit) 94 bpm   Rating of Perceived Exertion (Exercise) 8   Duration Progress to 30 minutes of continuous aerobic without signs/symptoms of  physical distress   Intensity Rest + 30     Progression   Progression Continue progressive overload as per policy without signs/symptoms or physical distress.     Resistance Training   Training Prescription Yes   Weight 3   Reps 10-12     Treadmill   MPH 2.7   Grade 0   Minutes 15   METs 3     NuStep   Level 3   Watts 37   Minutes 20   METs 3.65     Home Exercise Plan   Plans to continue exercise at Home   Frequency Add 2 additional days to program exercise sessions.      Nutrition:  Target Goals: Understanding of nutrition guidelines, daily intake of sodium 1500mg , cholesterol 200mg , calories 30% from  fat and 7% or less from saturated fats, daily to have 5 or more servings of fruits and vegetables.  Biometrics:     Pre Biometrics - 06/02/16 1003      Pre Biometrics   Height 6' (1.829 m)   Weight 210 lb 5.1 oz (95.4 kg)   Waist Circumference 39 inches   Hip Circumference 38.5 inches   Waist to Hip Ratio 1.01 %   BMI (Calculated) 28.6   Triceps Skinfold 14 mm   % Body Fat 26.5 %   Grip Strength 43.67 kg   Flexibility 11.5 in   Single Leg Stand 6 seconds       Nutrition Therapy Plan and Nutrition Goals:   Nutrition Discharge: Rate Your Plate Scores:     Nutrition Assessments - 06/02/16 1110      MEDFICTS Scores   Pre Score 36      Nutrition Goals Re-Evaluation:   Psychosocial: Target Goals: Acknowledge presence or absence of depression, maximize coping skills, provide positive support system. Participant is able to verbalize types and ability to use techniques and skills needed for reducing stress and depression.  Initial Review & Psychosocial Screening:     Initial Psych Review & Screening - 06/02/16 1113      Initial Review   Current issues with --  No concerns at this time     [place]? Yes     Barriers   Psychosocial barriers to participate in program There are no identifiable barriers or  psychosocial needs.     Screening Interventions   Interventions Encouraged to exercise      Quality of Life Scores:     Quality of Life - 06/02/16 1004      Quality of Life Scores   Health/Function Pre 28.2 %   Socioeconomic Pre 30 %   Psych/Spiritual Pre 30 %   Family Pre 30 %   GLOBAL Pre 29.23 %      PHQ-9: Recent Review Flowsheet Data    Depression screen [facility] 2/9 06/02/2016   Decreased Interest 0   Down, Depressed, Hopeless 0   PHQ - 2 Score 0   Altered sleeping 0   Tired, decreased energy 0   Change in appetite 0   Feeling bad or failure about yourself  0   Trouble concentrating 0   Moving slowly or fidgety/restless 0   Suicidal thoughts 0   PHQ-9 Score 0      Psychosocial Evaluation and Intervention:     Psychosocial Evaluation - 06/02/16 1114      Psychosocial Evaluation & Interventions   Interventions Encouraged to exercise with the program and follow exercise prescription   Continued Psychosocial Services Needed No  QOL scores WNL: 29.23      Psychosocial Re-Evaluation:     Psychosocial Re-Evaluation    Row Name 06/18/16 1044 07/15/16 1517           Psychosocial Re-Evaluation   Interventions Encouraged to attend Cardiac Rehabilitation for the exercise Encouraged to attend Cardiac Rehabilitation for the exercise      Comments Patient's QOL WNL at 29.23. Continues to have no psychosocial issues identified.  Patient continues to have no psychosocial issues identified.       Continued Psychosocial Services Needed No No         Vocational Rehabilitation: Provide vocational rehab assistance to qualifying candidates.   Vocational Rehab Evaluation & Intervention:     Vocational Rehab - 06/02/16 1106  Initial Vocational Rehab Evaluation & Intervention   Assessment shows need for Vocational Rehabilitation No      Education: Education Goals: Education classes will be provided on a weekly basis, covering required topics. Participant  will state understanding/return demonstration of topics presented.  Learning Barriers/Preferences:     Learning Barriers/Preferences - 06/02/16 1106      Learning Barriers/Preferences   Learning Barriers None   Learning Preferences Written Material;Video      Education Topics: Hypertension, Hypertension Reduction -Define heart disease and high blood pressure. Discus how high blood pressure affects the body and ways to reduce high blood pressure.   Exercise and Your Heart -Discuss why it is important to exercise, the FITT principles of exercise, normal and abnormal responses to exercise, and how to exercise safely. Flowsheet Row CARDIAC REHAB PHASE II EXERCISE from 07/15/2016 in [place]  Date  07/15/16  Educator  DC  Instruction Review Code  2- meets goals/outcomes      Angina -Discuss definition of angina, causes of angina, treatment of angina, and how to decrease risk of having angina.   Cardiac Medications -Review what the following cardiac medications are used for, how they affect the body, and side effects that may occur when taking the medications.  Medications include Aspirin, Beta blockers, calcium channel blockers, ACE Inhibitors, angiotensin receptor blockers, diuretics, digoxin, and antihyperlipidemics.   Congestive Heart Failure -Discuss the definition of CHF, how to live with CHF, the signs and symptoms of CHF, and how keep track of weight and sodium intake.   Heart Disease and Intimacy -Discus the effect sexual activity has on the heart, how changes occur during intimacy as we age, and safety during sexual activity.   Smoking Cessation / COPD -Discuss different methods to quit smoking, the health benefits of quitting smoking, and the definition of COPD.   Nutrition I: Fats -Discuss the types of cholesterol, what cholesterol does to the heart, and how cholesterol levels can be controlled.   Nutrition II: Labels -Discuss the  different components of food labels and how to read food label   Heart Parts and Heart Disease -Discuss the anatomy of the heart, the pathway of blood circulation through the heart, and these are affected by heart disease. Flowsheet Row CARDIAC REHAB PHASE II EXERCISE from 07/15/2016 in [place]  Date  06/10/16  Educator  Russella Dar  Instruction Review Code  2- meets goals/outcomes      Stress I: Signs and Symptoms -Discuss the causes of stress, how stress may lead to anxiety and depression, and ways to limit stress. Flowsheet Row CARDIAC REHAB PHASE II EXERCISE from 07/15/2016 in [place]  Date  06/17/16  Educator  [place]  Instruction Review Code  2- meets goals/outcomes      Stress II: Relaxation -Discuss different types of relaxation techniques to limit stress. Flowsheet Row CARDIAC REHAB PHASE II EXERCISE from 07/15/2016 in [place]  Date  06/24/16  Educator  [place]  Instruction Review Code  2- meets goals/outcomes      Warning Signs of Stroke / TIA -Discuss definition of a stroke, what the signs and symptoms are of a stroke, and how to identify when someone is having stroke. Flowsheet Row CARDIAC REHAB PHASE II EXERCISE from 07/15/2016 in [place]  Date  07/01/16  Educator  DJ  Instruction Review Code  2- meets goals/outcomes      Knowledge Questionnaire Score:     Knowledge Questionnaire  Score - 06/02/16 1106      Knowledge Questionnaire Score   Pre Score 22/24      Core Components/Risk Factors/Patient Goals at Admission:     Personal Goals and Risk Factors at Admission - 06/02/16 1110      Core Components/Risk Factors/Patient Goals on Admission    Weight Management Weight Maintenance   Increase Strength and Stamina Yes   Intervention Provide advice, education, support and counseling about physical activity/exercise needs.;Develop an individualized exercise prescription  for aerobic and resistive training based on initial evaluation findings, risk stratification, comorbidities and participant's personal goals.   Expected Outcomes Achievement of increased cardiorespiratory fitness and enhanced flexibility, muscular endurance and strength shown through measurements of functional capacity and personal statement of participant.   Personal Goal Other Yes   Personal Goal Get physically active again, gain some muscle back   Intervention Attend Cardiac Rehab 3xweek and to supplement exercise 2xweek at home.    Expected Outcomes Achieve personal goals.       Core Components/Risk Factors/Patient Goals Review:      Goals and Risk Factor Review    Row Name 06/02/16 1113 06/18/16 1042 07/15/16 1515         Core Components/Risk Factors/Patient Goals Review   Personal Goals Review Weight Management/Obesity;Increase Strength and Stamina Weight Management/Obesity;Increase Strength and Stamina  Gain some muscle back; get physically active again. Weight Management/Obesity;Increase Strength and Stamina  Gain muscle back; get physically active again.     Review  - Patient has attended 5 sessions with some progression. Will continue to monitor for progress. Patient has attended 16 sessions. He has gained 5.6 lbs. He is progressing well with increased strength and stamina. He says he feels stronger.      Expected Outcomes  - Patient will continue to attend sessions and complete the program meeting her personal goals.  Patient will complete the program meeting his personal goals.         Core Components/Risk Factors/Patient Goals at Discharge (Final Review):      Goals and Risk Factor Review - 07/15/16 1515      Core Components/Risk Factors/Patient Goals Review   Personal Goals Review Weight Management/Obesity;Increase Strength and Stamina  Gain muscle back; get physically active again.   Review Patient has attended 16 sessions. He has gained 5.6 lbs. He is progressing  well with increased strength and stamina. He says he feels stronger.    Expected Outcomes Patient will complete the program meeting his personal goals.       ITP Comments:   Comments: ITP 30 Day REVIEW Pt is doing well in the program. Will continue to monitor for progress.

## 2016-07-15 NOTE — Progress Notes (Signed)
Daily Session Note  Patient Details  Name: YAKOV BERGEN MRN: [id number] Date of Birth: 1950/10/26 Referring Provider:   Flowsheet Row CARDIAC REHAB PHASE II ORIENTATION from 06/02/2016 in [place]  Referring Provider  [name]      Encounter Date: 07/15/2016  Check In:     Session Check In - 07/15/16 0930      Check-In   Location [facility]   Staff Present Diane Angelina Pih, MS, EP, [facility], Exercise Physiologist;Gregory Luther Parody, BS, EP, Exercise Physiologist;Norton Bivins Wynetta Emery, RN, BSN   Supervising physician immediately available to respond to emergencies See telemetry face sheet for immediately available MD   Medication changes reported     No   Fall or balance concerns reported    No   Warm-up and Cool-down Performed as group-led instruction   Resistance Training Performed Yes   VAD Patient? No     Pain Assessment   Currently in Pain? No/denies   Pain Score 0-No pain   Multiple Pain Sites No      Capillary Blood Glucose: No results found for this or any previous visit (from the past 24 hour(s)).   Goals Met:  Independence with exercise equipment Exercise tolerated well No report of cardiac concerns or symptoms Strength training completed today  Goals Unmet:  Not Applicable  Comments: Check out 1030.   [name] is Medical Director for [facility] Cardiac and Pulmonary Rehab.

## 2016-07-16 DIAGNOSIS — B9689 Other specified bacterial agents as the cause of diseases classified elsewhere: Secondary | ICD-10-CM | POA: Diagnosis not present

## 2016-07-16 DIAGNOSIS — Z1283 Encounter for screening for malignant neoplasm of skin: Secondary | ICD-10-CM | POA: Diagnosis not present

## 2016-07-16 DIAGNOSIS — D225 Melanocytic nevi of trunk: Secondary | ICD-10-CM | POA: Diagnosis not present

## 2016-07-16 DIAGNOSIS — L814 Other melanin hyperpigmentation: Secondary | ICD-10-CM | POA: Diagnosis not present

## 2016-07-16 DIAGNOSIS — L02229 Furuncle of trunk, unspecified: Secondary | ICD-10-CM | POA: Diagnosis not present

## 2016-07-16 DIAGNOSIS — D485 Neoplasm of uncertain behavior of skin: Secondary | ICD-10-CM | POA: Diagnosis not present

## 2016-07-17 ENCOUNTER — Encounter (HOSPITAL_COMMUNITY)
Admission: RE | Admit: 2016-07-17 | Discharge: 2016-07-17 | Disposition: A | Discharge status: Home / Self Care | Payer: Medicare HMO | Source: Ambulatory Visit | Attending: Cardiovascular Disease | Admitting: Cardiovascular Disease

## 2016-07-17 DIAGNOSIS — I10 Essential (primary) hypertension: Secondary | ICD-10-CM | POA: Diagnosis not present

## 2016-07-17 DIAGNOSIS — Z79899 Other long term (current) drug therapy: Secondary | ICD-10-CM | POA: Diagnosis not present

## 2016-07-17 DIAGNOSIS — E785 Hyperlipidemia, unspecified: Secondary | ICD-10-CM | POA: Diagnosis not present

## 2016-07-17 DIAGNOSIS — Z7982 Long term (current) use of aspirin: Secondary | ICD-10-CM | POA: Diagnosis not present

## 2016-07-17 DIAGNOSIS — Z7902 Long term (current) use of antithrombotics/antiplatelets: Secondary | ICD-10-CM | POA: Diagnosis not present

## 2016-07-17 DIAGNOSIS — Z951 Presence of aortocoronary bypass graft: Secondary | ICD-10-CM | POA: Diagnosis not present

## 2016-07-17 DIAGNOSIS — E669 Obesity, unspecified: Secondary | ICD-10-CM | POA: Diagnosis not present

## 2016-07-17 DIAGNOSIS — I2510 Atherosclerotic heart disease of native coronary artery without angina pectoris: Secondary | ICD-10-CM | POA: Diagnosis not present

## 2016-07-17 DIAGNOSIS — E119 Type 2 diabetes mellitus without complications: Secondary | ICD-10-CM | POA: Diagnosis not present

## 2016-07-17 NOTE — Progress Notes (Signed)
Daily Session Note  Patient Details  Name: Jonathan Shepard MRN: [id number] Date of Birth: [birth year] Referring Provider:   Flowsheet Row CARDIAC REHAB PHASE II ORIENTATION from 06/02/2016 in [place]  Referring Provider  Dr. [name]      Encounter Date: 07/17/2016  Check In:     Session Check In - 07/17/16 0930      Check-In   Location [facility]   Staff Present Russella Dar, MS, EP, [facility], Exercise Physiologist;Gregory Luther Parody, BS, EP, Exercise Physiologist   Supervising physician immediately available to respond to emergencies See telemetry face sheet for immediately available MD   Medication changes reported     No   Fall or balance concerns reported    No   Warm-up and Cool-down Performed as group-led instruction   Resistance Training Performed Yes   VAD Patient? No     Pain Assessment   Currently in Pain? No/denies   Pain Score 0-No pain   Multiple Pain Sites No      Capillary Blood Glucose: No results found for this or any previous visit (from the past 24 hour(s)).   Goals Met:  Independence with exercise equipment Exercise tolerated well No report of cardiac concerns or symptoms Strength training completed today  Goals Unmet:  Not Applicable  Comments: Check out: 10:30   [name] is Medical Director for [place] and Pulmonary Rehab.

## 2016-07-20 ENCOUNTER — Encounter (HOSPITAL_COMMUNITY)
Admission: RE | Admit: 2016-07-20 | Discharge: 2016-07-20 | Disposition: A | Discharge status: Home / Self Care | Payer: Medicare HMO | Source: Ambulatory Visit | Attending: Cardiovascular Disease | Admitting: Cardiovascular Disease

## 2016-07-20 DIAGNOSIS — Z7902 Long term (current) use of antithrombotics/antiplatelets: Secondary | ICD-10-CM | POA: Diagnosis not present

## 2016-07-20 DIAGNOSIS — Z79899 Other long term (current) drug therapy: Secondary | ICD-10-CM | POA: Diagnosis not present

## 2016-07-20 DIAGNOSIS — Z951 Presence of aortocoronary bypass graft: Secondary | ICD-10-CM

## 2016-07-20 DIAGNOSIS — E669 Obesity, unspecified: Secondary | ICD-10-CM | POA: Diagnosis not present

## 2016-07-20 DIAGNOSIS — I10 Essential (primary) hypertension: Secondary | ICD-10-CM | POA: Diagnosis not present

## 2016-07-20 DIAGNOSIS — E119 Type 2 diabetes mellitus without complications: Secondary | ICD-10-CM | POA: Diagnosis not present

## 2016-07-20 DIAGNOSIS — I2510 Atherosclerotic heart disease of native coronary artery without angina pectoris: Secondary | ICD-10-CM | POA: Diagnosis not present

## 2016-07-20 DIAGNOSIS — E785 Hyperlipidemia, unspecified: Secondary | ICD-10-CM | POA: Diagnosis not present

## 2016-07-20 DIAGNOSIS — Z7982 Long term (current) use of aspirin: Secondary | ICD-10-CM | POA: Diagnosis not present

## 2016-07-20 NOTE — Progress Notes (Signed)
Daily Session Note  Patient Details  Name: Jonathan Shepard MRN: [id number] Date of Birth: 1950-08-05 Referring Provider:   Flowsheet Row CARDIAC REHAB PHASE II ORIENTATION from 06/02/2016 in [place]  Referring Provider  [name]      Encounter Date: 07/20/2016  Check In:     Session Check In - 07/20/16 0930      Check-In   Location [facility]   Staff Present Suzanne Boron, BS, EP, Exercise Physiologist;Vallery Mcdade Wynetta Emery, RN, BSN   Supervising physician immediately available to respond to emergencies See telemetry face sheet for immediately available MD   Medication changes reported     No   Fall or balance concerns reported    No   Warm-up and Cool-down Performed as group-led instruction   Resistance Training Performed Yes   VAD Patient? No     Pain Assessment   Currently in Pain? No/denies   Pain Score 0-No pain   Multiple Pain Sites No      Capillary Blood Glucose: No results found for this or any previous visit (from the past 24 hour(s)).   Goals Met:  Independence with exercise equipment Exercise tolerated well No report of cardiac concerns or symptoms Strength training completed today  Goals Unmet:  Not Applicable  Comments: Check out 1030.   [name] is Medical Director for [facility] Cardiac and Pulmonary Rehab.

## 2016-07-22 ENCOUNTER — Encounter (HOSPITAL_COMMUNITY)
Admission: RE | Admit: 2016-07-22 | Discharge: 2016-07-22 | Disposition: A | Discharge status: Home / Self Care | Payer: Medicare HMO | Source: Ambulatory Visit | Attending: Cardiovascular Disease | Admitting: Cardiovascular Disease

## 2016-07-22 DIAGNOSIS — I10 Essential (primary) hypertension: Secondary | ICD-10-CM | POA: Diagnosis not present

## 2016-07-22 DIAGNOSIS — Z7982 Long term (current) use of aspirin: Secondary | ICD-10-CM | POA: Diagnosis not present

## 2016-07-22 DIAGNOSIS — Z951 Presence of aortocoronary bypass graft: Secondary | ICD-10-CM

## 2016-07-22 DIAGNOSIS — E669 Obesity, unspecified: Secondary | ICD-10-CM | POA: Diagnosis not present

## 2016-07-22 DIAGNOSIS — I2510 Atherosclerotic heart disease of native coronary artery without angina pectoris: Secondary | ICD-10-CM | POA: Diagnosis not present

## 2016-07-22 DIAGNOSIS — Z7902 Long term (current) use of antithrombotics/antiplatelets: Secondary | ICD-10-CM | POA: Diagnosis not present

## 2016-07-22 DIAGNOSIS — E119 Type 2 diabetes mellitus without complications: Secondary | ICD-10-CM | POA: Diagnosis not present

## 2016-07-22 DIAGNOSIS — E785 Hyperlipidemia, unspecified: Secondary | ICD-10-CM | POA: Diagnosis not present

## 2016-07-22 DIAGNOSIS — Z79899 Other long term (current) drug therapy: Secondary | ICD-10-CM | POA: Diagnosis not present

## 2016-07-22 NOTE — Progress Notes (Signed)
Daily Session Note  Patient Details  Name: HEYWOOD TOKUNAGA MRN: [id number] Date of Birth: 1950/07/20 Referring Provider:   Flowsheet Row CARDIAC REHAB PHASE II ORIENTATION from 06/02/2016 in [place]  Referring Provider  [name]      Encounter Date: 07/22/2016  Check In:     Session Check In - 07/22/16 0930      Check-In   Location [facility]   Staff Present Suzanne Boron, BS, EP, Exercise Physiologist;Siena Poehler Wynetta Emery, RN, BSN   Supervising physician immediately available to respond to emergencies See telemetry face sheet for immediately available MD   Medication changes reported     No   Fall or balance concerns reported    No   Warm-up and Cool-down Performed as group-led instruction   Resistance Training Performed Yes   VAD Patient? No     Pain Assessment   Currently in Pain? No/denies   Pain Score 0-No pain   Multiple Pain Sites No      Capillary Blood Glucose: No results found for this or any previous visit (from the past 24 hour(s)).   Goals Met:  Independence with exercise equipment Exercise tolerated well No report of cardiac concerns or symptoms Strength training completed today  Goals Unmet:  Not Applicable  Comments: Check out 1030.   [name] is Medical Director for [facility] Cardiac and Pulmonary Rehab.

## 2016-07-24 ENCOUNTER — Encounter (HOSPITAL_COMMUNITY)
Admission: RE | Admit: 2016-07-24 | Discharge: 2016-07-24 | Disposition: A | Discharge status: Home / Self Care | Payer: Medicare HMO | Source: Ambulatory Visit | Attending: Cardiovascular Disease | Admitting: Cardiovascular Disease

## 2016-07-24 DIAGNOSIS — E669 Obesity, unspecified: Secondary | ICD-10-CM | POA: Diagnosis not present

## 2016-07-24 DIAGNOSIS — E785 Hyperlipidemia, unspecified: Secondary | ICD-10-CM | POA: Diagnosis not present

## 2016-07-24 DIAGNOSIS — E119 Type 2 diabetes mellitus without complications: Secondary | ICD-10-CM | POA: Diagnosis not present

## 2016-07-24 DIAGNOSIS — Z7982 Long term (current) use of aspirin: Secondary | ICD-10-CM | POA: Diagnosis not present

## 2016-07-24 DIAGNOSIS — Z79899 Other long term (current) drug therapy: Secondary | ICD-10-CM | POA: Diagnosis not present

## 2016-07-24 DIAGNOSIS — I10 Essential (primary) hypertension: Secondary | ICD-10-CM | POA: Diagnosis not present

## 2016-07-24 DIAGNOSIS — Z951 Presence of aortocoronary bypass graft: Secondary | ICD-10-CM | POA: Diagnosis not present

## 2016-07-24 DIAGNOSIS — Z7902 Long term (current) use of antithrombotics/antiplatelets: Secondary | ICD-10-CM | POA: Diagnosis not present

## 2016-07-24 DIAGNOSIS — I2510 Atherosclerotic heart disease of native coronary artery without angina pectoris: Secondary | ICD-10-CM | POA: Diagnosis not present

## 2016-07-24 NOTE — Progress Notes (Signed)
Daily Session Note  Patient Details  Name: COOLIDGE GOSSARD MRN: [id number] Date of Birth: [birth year] Referring Provider:   Flowsheet Row CARDIAC REHAB PHASE II ORIENTATION from 06/02/2016 in [place]  Referring Provider  [name]      Encounter Date: 07/24/2016  Check In:     Session Check In - 07/24/16 0930      Check-In   Location [facility]   Staff Present Suzanne Boron, BS, EP, Exercise Physiologist;Ryder Man Wynetta Emery, RN, BSN   Supervising physician immediately available to respond to emergencies See telemetry face sheet for immediately available MD   Medication changes reported     No   Fall or balance concerns reported    No   Warm-up and Cool-down Performed as group-led instruction   Resistance Training Performed Yes   VAD Patient? No     Pain Assessment   Currently in Pain? No/denies   Pain Score 0-No pain   Multiple Pain Sites No      Capillary Blood Glucose: No results found for this or any previous visit (from the past 24 hour(s)).   Goals Met:  Independence with exercise equipment Exercise tolerated well No report of cardiac concerns or symptoms Strength training completed today  Goals Unmet:  Not Applicable  Comments: Check out 1030.   [name] is Medical Director for [facility] Cardiac and Pulmonary Rehab.

## 2016-07-27 ENCOUNTER — Encounter (HOSPITAL_COMMUNITY)
Admission: RE | Admit: 2016-07-27 | Discharge: 2016-07-27 | Disposition: A | Discharge status: Home / Self Care | Payer: Medicare HMO | Source: Ambulatory Visit | Attending: Cardiovascular Disease | Admitting: Cardiovascular Disease

## 2016-07-27 DIAGNOSIS — Z951 Presence of aortocoronary bypass graft: Secondary | ICD-10-CM | POA: Diagnosis not present

## 2016-07-27 DIAGNOSIS — E119 Type 2 diabetes mellitus without complications: Secondary | ICD-10-CM | POA: Diagnosis not present

## 2016-07-27 DIAGNOSIS — Z79899 Other long term (current) drug therapy: Secondary | ICD-10-CM | POA: Diagnosis not present

## 2016-07-27 DIAGNOSIS — Z7982 Long term (current) use of aspirin: Secondary | ICD-10-CM | POA: Diagnosis not present

## 2016-07-27 DIAGNOSIS — Z7902 Long term (current) use of antithrombotics/antiplatelets: Secondary | ICD-10-CM | POA: Diagnosis not present

## 2016-07-27 DIAGNOSIS — E669 Obesity, unspecified: Secondary | ICD-10-CM | POA: Diagnosis not present

## 2016-07-27 DIAGNOSIS — I10 Essential (primary) hypertension: Secondary | ICD-10-CM | POA: Diagnosis not present

## 2016-07-27 DIAGNOSIS — I2510 Atherosclerotic heart disease of native coronary artery without angina pectoris: Secondary | ICD-10-CM | POA: Diagnosis not present

## 2016-07-27 DIAGNOSIS — E785 Hyperlipidemia, unspecified: Secondary | ICD-10-CM | POA: Diagnosis not present

## 2016-07-27 NOTE — Progress Notes (Signed)
Daily Session Note  Patient Details  Name: Jonathan Shepard MRN: [id number] Date of Birth: [birth year] Referring Provider:   Flowsheet Row CARDIAC REHAB PHASE II ORIENTATION from 06/02/2016 in [place]  Referring Provider  [name]      Encounter Date: 07/27/2016  Check In:     Session Check In - 07/27/16 0930      Check-In   Location [facility]   Staff Present Suzanne Boron, BS, EP, Exercise Physiologist;Nyheem Binette Wynetta Emery, RN, BSN   Supervising physician immediately available to respond to emergencies See telemetry face sheet for immediately available MD   Medication changes reported     No   Fall or balance concerns reported    No   Warm-up and Cool-down Performed as group-led instruction   Resistance Training Performed Yes   VAD Patient? No     Pain Assessment   Currently in Pain? No/denies   Pain Score 0-No pain   Multiple Pain Sites No      Capillary Blood Glucose: No results found for this or any previous visit (from the past 24 hour(s)).   Goals Met:  Independence with exercise equipment Exercise tolerated well No report of cardiac concerns or symptoms Strength training completed today  Goals Unmet:  Not Applicable  Comments: Check out 1030.   [name] is Medical Director for [facility] Cardiac and Pulmonary Rehab.

## 2016-07-29 ENCOUNTER — Encounter (HOSPITAL_COMMUNITY)
Admission: RE | Admit: 2016-07-29 | Discharge: 2016-07-29 | Disposition: A | Discharge status: Home / Self Care | Payer: Medicare HMO | Source: Ambulatory Visit | Attending: Cardiovascular Disease | Admitting: Cardiovascular Disease

## 2016-07-29 DIAGNOSIS — Z951 Presence of aortocoronary bypass graft: Secondary | ICD-10-CM

## 2016-07-29 DIAGNOSIS — E785 Hyperlipidemia, unspecified: Secondary | ICD-10-CM | POA: Diagnosis not present

## 2016-07-29 DIAGNOSIS — Z7982 Long term (current) use of aspirin: Secondary | ICD-10-CM | POA: Diagnosis not present

## 2016-07-29 DIAGNOSIS — I10 Essential (primary) hypertension: Secondary | ICD-10-CM | POA: Diagnosis not present

## 2016-07-29 DIAGNOSIS — E669 Obesity, unspecified: Secondary | ICD-10-CM | POA: Diagnosis not present

## 2016-07-29 DIAGNOSIS — E119 Type 2 diabetes mellitus without complications: Secondary | ICD-10-CM | POA: Diagnosis not present

## 2016-07-29 DIAGNOSIS — I2510 Atherosclerotic heart disease of native coronary artery without angina pectoris: Secondary | ICD-10-CM | POA: Diagnosis not present

## 2016-07-29 DIAGNOSIS — Z7902 Long term (current) use of antithrombotics/antiplatelets: Secondary | ICD-10-CM | POA: Diagnosis not present

## 2016-07-29 DIAGNOSIS — Z79899 Other long term (current) drug therapy: Secondary | ICD-10-CM | POA: Diagnosis not present

## 2016-07-29 NOTE — Progress Notes (Signed)
Daily Session Note  Patient Details  Name: GUILFORD SHANNAHAN MRN: [id number] Date of Birth: [birth year] Referring Provider:   Flowsheet Row CARDIAC REHAB PHASE II ORIENTATION from 06/02/2016 in [place]  Referring Provider  [name]      Encounter Date: 07/29/2016  Check In:     Session Check In - 07/29/16 0930      Check-In   Location [facility]   Staff Present Suzanne Boron, BS, EP, Exercise Physiologist;Zamire Whitehurst Wynetta Emery, RN, BSN   Supervising physician immediately available to respond to emergencies See telemetry face sheet for immediately available MD   Medication changes reported     No   Fall or balance concerns reported    No   Warm-up and Cool-down Performed as group-led instruction   Resistance Training Performed Yes   VAD Patient? No     Pain Assessment   Currently in Pain? No/denies   Pain Score 0-No pain   Multiple Pain Sites No      Capillary Blood Glucose: No results found for this or any previous visit (from the past 24 hour(s)).   Goals Met:  Independence with exercise equipment Exercise tolerated well No report of cardiac concerns or symptoms Strength training completed today  Goals Unmet:  Not Applicable  Comments: Check out 1030.   [name] is Medical Director for [facility] Cardiac and Pulmonary Rehab.

## 2016-07-31 ENCOUNTER — Encounter (HOSPITAL_COMMUNITY)
Admission: RE | Admit: 2016-07-31 | Discharge: 2016-07-31 | Disposition: A | Discharge status: Home / Self Care | Payer: Medicare HMO | Source: Ambulatory Visit | Attending: Cardiovascular Disease | Admitting: Cardiovascular Disease

## 2016-07-31 DIAGNOSIS — E785 Hyperlipidemia, unspecified: Secondary | ICD-10-CM | POA: Diagnosis not present

## 2016-07-31 DIAGNOSIS — Z951 Presence of aortocoronary bypass graft: Secondary | ICD-10-CM

## 2016-07-31 DIAGNOSIS — I10 Essential (primary) hypertension: Secondary | ICD-10-CM | POA: Diagnosis not present

## 2016-07-31 DIAGNOSIS — I2510 Atherosclerotic heart disease of native coronary artery without angina pectoris: Secondary | ICD-10-CM | POA: Diagnosis not present

## 2016-07-31 DIAGNOSIS — Z79899 Other long term (current) drug therapy: Secondary | ICD-10-CM | POA: Diagnosis not present

## 2016-07-31 DIAGNOSIS — Z7982 Long term (current) use of aspirin: Secondary | ICD-10-CM | POA: Diagnosis not present

## 2016-07-31 DIAGNOSIS — E119 Type 2 diabetes mellitus without complications: Secondary | ICD-10-CM | POA: Diagnosis not present

## 2016-07-31 DIAGNOSIS — E669 Obesity, unspecified: Secondary | ICD-10-CM | POA: Diagnosis not present

## 2016-07-31 DIAGNOSIS — Z7902 Long term (current) use of antithrombotics/antiplatelets: Secondary | ICD-10-CM | POA: Diagnosis not present

## 2016-07-31 NOTE — Progress Notes (Signed)
Daily Session Note  Patient Details  Name: MONTFORD BARG MRN: [id number] Date of Birth: 08-12-1950 Referring Provider:   Flowsheet Row CARDIAC REHAB PHASE II ORIENTATION from 06/02/2016 in [place]  Referring Provider  [name]      Encounter Date: 07/31/2016  Check In:     Session Check In - 07/31/16 0930      Check-In   Location [facility]   Staff Present Diane Angelina Pih, MS, EP, [facility], Exercise Physiologist;Gregory Luther Parody, BS, EP, Exercise Physiologist;Mirelle Biskup Wynetta Emery, RN, BSN   Supervising physician immediately available to respond to emergencies See telemetry face sheet for immediately available MD   Medication changes reported     No   Fall or balance concerns reported    No   Warm-up and Cool-down Performed as group-led instruction   Resistance Training Performed Yes   VAD Patient? No     Pain Assessment   Currently in Pain? No/denies   Pain Score 0-No pain   Multiple Pain Sites No      Capillary Blood Glucose: No results found for this or any previous visit (from the past 24 hour(s)).   Goals Met:  Independence with exercise equipment Exercise tolerated well No report of cardiac concerns or symptoms Strength training completed today  Goals Unmet:  Not Applicable  Comments: Check out 1030.   [name] is Medical Director for [facility] Cardiac and Pulmonary Rehab.

## 2016-08-03 ENCOUNTER — Encounter (HOSPITAL_COMMUNITY)
Admission: RE | Admit: 2016-08-03 | Discharge: 2016-08-03 | Disposition: A | Discharge status: Home / Self Care | Payer: Medicare HMO | Source: Ambulatory Visit | Attending: Cardiovascular Disease | Admitting: Cardiovascular Disease

## 2016-08-03 DIAGNOSIS — Z79899 Other long term (current) drug therapy: Secondary | ICD-10-CM | POA: Diagnosis not present

## 2016-08-03 DIAGNOSIS — E785 Hyperlipidemia, unspecified: Secondary | ICD-10-CM | POA: Diagnosis not present

## 2016-08-03 DIAGNOSIS — Z951 Presence of aortocoronary bypass graft: Secondary | ICD-10-CM

## 2016-08-03 DIAGNOSIS — I10 Essential (primary) hypertension: Secondary | ICD-10-CM | POA: Diagnosis not present

## 2016-08-03 DIAGNOSIS — E119 Type 2 diabetes mellitus without complications: Secondary | ICD-10-CM | POA: Diagnosis not present

## 2016-08-03 DIAGNOSIS — I2510 Atherosclerotic heart disease of native coronary artery without angina pectoris: Secondary | ICD-10-CM | POA: Diagnosis not present

## 2016-08-03 DIAGNOSIS — Z7982 Long term (current) use of aspirin: Secondary | ICD-10-CM | POA: Diagnosis not present

## 2016-08-03 DIAGNOSIS — E669 Obesity, unspecified: Secondary | ICD-10-CM | POA: Diagnosis not present

## 2016-08-03 DIAGNOSIS — Z7902 Long term (current) use of antithrombotics/antiplatelets: Secondary | ICD-10-CM | POA: Diagnosis not present

## 2016-08-03 NOTE — Progress Notes (Signed)
Daily Session Note  Patient Details  Name: Jonathan Shepard MRN: [id number] Date of Birth: 03/08/1951 Referring Provider:   Flowsheet Row CARDIAC REHAB PHASE II ORIENTATION from 06/02/2016 in [place] CARDIAC REHABILITATION  Referring Provider  Dr. Croitoru      Encounter Date: 08/03/2016  Check In:     Session Check In - 08/03/16 0930      Check-In   Location [facility]   Staff Present Diane Coad, MS, EP, [facility], Exercise Physiologist;Gregory Cowan, BS, EP, Exercise Physiologist;Debra Johnson, RN, BSN   Supervising physician immediately available to respond to emergencies See telemetry face sheet for immediately available MD   Medication changes reported     No   Fall or balance concerns reported    No   Warm-up and Cool-down Performed as group-led instruction   Resistance Training Performed Yes   VAD Patient? No     Pain Assessment   Currently in Pain? No/denies   Pain Score 0-No pain   Multiple Pain Sites No      Capillary Blood Glucose: No results found for this or any previous visit (from the past 24 hour(s)).    History  Smoking Status  . Never Smoker  Smokeless Tobacco  . Never Used    Goals Met:  Independence with exercise equipment Exercise tolerated well No report of cardiac concerns or symptoms Strength training completed today  Goals Unmet:  Not Applicable  Comments: Check out 1030.   Dr. Suresh Koneswaran is Medical Director for [place] Cardiac and Pulmonary Rehab. 

## 2016-08-05 ENCOUNTER — Encounter (HOSPITAL_COMMUNITY)
Admission: RE | Admit: 2016-08-05 | Discharge: 2016-08-05 | Disposition: A | Discharge status: Home / Self Care | Payer: Medicare HMO | Source: Ambulatory Visit | Attending: Cardiovascular Disease | Admitting: Cardiovascular Disease

## 2016-08-05 DIAGNOSIS — E785 Hyperlipidemia, unspecified: Secondary | ICD-10-CM | POA: Diagnosis not present

## 2016-08-05 DIAGNOSIS — Z79899 Other long term (current) drug therapy: Secondary | ICD-10-CM | POA: Diagnosis not present

## 2016-08-05 DIAGNOSIS — Z7902 Long term (current) use of antithrombotics/antiplatelets: Secondary | ICD-10-CM | POA: Diagnosis not present

## 2016-08-05 DIAGNOSIS — Z951 Presence of aortocoronary bypass graft: Secondary | ICD-10-CM

## 2016-08-05 DIAGNOSIS — E119 Type 2 diabetes mellitus without complications: Secondary | ICD-10-CM | POA: Diagnosis not present

## 2016-08-05 DIAGNOSIS — I10 Essential (primary) hypertension: Secondary | ICD-10-CM | POA: Diagnosis not present

## 2016-08-05 DIAGNOSIS — Z7982 Long term (current) use of aspirin: Secondary | ICD-10-CM | POA: Diagnosis not present

## 2016-08-05 DIAGNOSIS — I2510 Atherosclerotic heart disease of native coronary artery without angina pectoris: Secondary | ICD-10-CM | POA: Diagnosis not present

## 2016-08-05 DIAGNOSIS — E669 Obesity, unspecified: Secondary | ICD-10-CM | POA: Diagnosis not present

## 2016-08-05 NOTE — Progress Notes (Signed)
Daily Session Note  Patient Details  Name: Jonathan Shepard MRN: [id number] Date of Birth: 10/19/1950 Referring Provider:   Flowsheet Row CARDIAC REHAB PHASE II ORIENTATION from 06/02/2016 in [place] CARDIAC REHABILITATION  Referring Provider  Dr. Croitoru      Encounter Date: 08/05/2016  Check In:     Session Check In - 08/05/16 0930      Check-In   Location [facility]   Staff Present Gregory Cowan, BS, EP, Exercise Physiologist;Debra Johnson, RN, BSN   Supervising physician immediately available to respond to emergencies See telemetry face sheet for immediately available MD   Medication changes reported     No   Fall or balance concerns reported    No   Warm-up and Cool-down Performed as group-led instruction   Resistance Training Performed Yes   VAD Patient? No     Pain Assessment   Currently in Pain? No/denies   Pain Score 0-No pain   Multiple Pain Sites No      Capillary Blood Glucose: No results found for this or any previous visit (from the past 24 hour(s)).    History  Smoking Status  . Never Smoker  Smokeless Tobacco  . Never Used    Goals Met:  Independence with exercise equipment Exercise tolerated well No report of cardiac concerns or symptoms Strength training completed today  Goals Unmet:  Not Applicable  Comments: Check out 1030.   Dr. Suresh Koneswaran is Medical Director for [place] Cardiac and Pulmonary Rehab. 

## 2016-08-07 ENCOUNTER — Encounter (HOSPITAL_COMMUNITY)
Admission: RE | Admit: 2016-08-07 | Discharge: 2016-08-07 | Disposition: A | Discharge status: Home / Self Care | Payer: Medicare HMO | Source: Ambulatory Visit | Attending: Cardiovascular Disease | Admitting: Cardiovascular Disease

## 2016-08-07 DIAGNOSIS — E119 Type 2 diabetes mellitus without complications: Secondary | ICD-10-CM | POA: Diagnosis not present

## 2016-08-07 DIAGNOSIS — Z7984 Long term (current) use of oral hypoglycemic drugs: Secondary | ICD-10-CM | POA: Insufficient documentation

## 2016-08-07 DIAGNOSIS — Z79899 Other long term (current) drug therapy: Secondary | ICD-10-CM | POA: Insufficient documentation

## 2016-08-07 DIAGNOSIS — E669 Obesity, unspecified: Secondary | ICD-10-CM | POA: Insufficient documentation

## 2016-08-07 DIAGNOSIS — Z7902 Long term (current) use of antithrombotics/antiplatelets: Secondary | ICD-10-CM | POA: Insufficient documentation

## 2016-08-07 DIAGNOSIS — Z951 Presence of aortocoronary bypass graft: Secondary | ICD-10-CM | POA: Diagnosis not present

## 2016-08-07 DIAGNOSIS — E785 Hyperlipidemia, unspecified: Secondary | ICD-10-CM | POA: Diagnosis not present

## 2016-08-07 DIAGNOSIS — Z7982 Long term (current) use of aspirin: Secondary | ICD-10-CM | POA: Diagnosis not present

## 2016-08-07 DIAGNOSIS — I10 Essential (primary) hypertension: Secondary | ICD-10-CM | POA: Diagnosis not present

## 2016-08-07 DIAGNOSIS — I2510 Atherosclerotic heart disease of native coronary artery without angina pectoris: Secondary | ICD-10-CM | POA: Diagnosis not present

## 2016-08-07 NOTE — Progress Notes (Signed)
Daily Session Note  Patient Details  Name: Jonathan Shepard MRN: [id number] Date of Birth: May 21, 1951 Referring Provider:   Flowsheet Row CARDIAC REHAB PHASE II ORIENTATION from 06/02/2016 in [place]  Referring Provider  [name]      Encounter Date: 08/07/2016  Check In:     Session Check In - 08/07/16 0930      Check-In   Location [facility]   Staff Present Russella Dar, MS, EP, [facility], Exercise Physiologist;Gregory Luther Parody, BS, EP, Exercise Physiologist   Supervising physician immediately available to respond to emergencies See telemetry face sheet for immediately available MD   Medication changes reported     No   Fall or balance concerns reported    No   Tobacco Cessation --  Non Smoker   Warm-up and Cool-down Performed as group-led instruction   Resistance Training Performed Yes   VAD Patient? No     Pain Assessment   Currently in Pain? No/denies   Pain Score 0-No pain   Multiple Pain Sites No      Capillary Blood Glucose: No results found for this or any previous visit (from the past 24 hour(s)).    History  Smoking Status  . Never Smoker  Smokeless Tobacco  . Never Used    Goals Met:  Independence with exercise equipment Exercise tolerated well Strength training completed today  Goals Unmet:  Not Applicable   Comments: Check out 1030   [name] is Medical Director for [place] and Pulmonary Rehab.

## 2016-08-10 ENCOUNTER — Encounter (HOSPITAL_COMMUNITY)
Admission: RE | Admit: 2016-08-10 | Discharge: 2016-08-10 | Disposition: A | Discharge status: Home / Self Care | Payer: Medicare HMO | Source: Ambulatory Visit | Attending: Cardiovascular Disease | Admitting: Cardiovascular Disease

## 2016-08-10 DIAGNOSIS — I2510 Atherosclerotic heart disease of native coronary artery without angina pectoris: Secondary | ICD-10-CM | POA: Diagnosis not present

## 2016-08-10 DIAGNOSIS — E785 Hyperlipidemia, unspecified: Secondary | ICD-10-CM | POA: Diagnosis not present

## 2016-08-10 DIAGNOSIS — E119 Type 2 diabetes mellitus without complications: Secondary | ICD-10-CM | POA: Diagnosis not present

## 2016-08-10 DIAGNOSIS — Z7902 Long term (current) use of antithrombotics/antiplatelets: Secondary | ICD-10-CM | POA: Diagnosis not present

## 2016-08-10 DIAGNOSIS — Z79899 Other long term (current) drug therapy: Secondary | ICD-10-CM | POA: Diagnosis not present

## 2016-08-10 DIAGNOSIS — Z7982 Long term (current) use of aspirin: Secondary | ICD-10-CM | POA: Diagnosis not present

## 2016-08-10 DIAGNOSIS — I10 Essential (primary) hypertension: Secondary | ICD-10-CM | POA: Diagnosis not present

## 2016-08-10 DIAGNOSIS — Z951 Presence of aortocoronary bypass graft: Secondary | ICD-10-CM | POA: Diagnosis not present

## 2016-08-10 DIAGNOSIS — E669 Obesity, unspecified: Secondary | ICD-10-CM | POA: Diagnosis not present

## 2016-08-10 NOTE — Progress Notes (Signed)
Daily Session Note  Patient Details  Name: Jonathan Shepard MRN: [id number] Date of Birth: Sep 30, 1950 Referring Provider:   Flowsheet Row CARDIAC REHAB PHASE II ORIENTATION from 06/02/2016 in [place]  Referring Provider  [name]      Encounter Date: 08/10/2016  Check In:     Session Check In - 08/10/16 0930      Check-In   Location [facility]   Staff Present Suzanne Boron, BS, EP, Exercise Physiologist;Kensley Valladares Wynetta Emery, RN, BSN   Supervising physician immediately available to respond to emergencies See telemetry face sheet for immediately available MD   Medication changes reported     No   Fall or balance concerns reported    No   Warm-up and Cool-down Performed as group-led instruction   Resistance Training Performed Yes   VAD Patient? No     Pain Assessment   Currently in Pain? No/denies   Pain Score 0-No pain   Multiple Pain Sites No      Capillary Blood Glucose: No results found for this or any previous visit (from the past 24 hour(s)).    History  Smoking Status  . Never Smoker  Smokeless Tobacco  . Never Used    Goals Met:  Independence with exercise equipment Exercise tolerated well No report of cardiac concerns or symptoms Strength training completed today  Goals Unmet:  Not Applicable  Comments: Check out 1030.   [name] is Medical Director for [facility] Cardiac and Pulmonary Rehab.

## 2016-08-11 NOTE — Progress Notes (Signed)
Cardiac Individual Treatment Plan  Patient Details  Name: Jonathan Shepard MRN: [id number] Date of Birth: 03-06-1951 Referring Provider:   Flowsheet Row CARDIAC REHAB PHASE II ORIENTATION from 06/02/2016 in [place]  Referring Provider  [name]      Initial Encounter Date:  Flowsheet Row CARDIAC REHAB PHASE II ORIENTATION from 06/02/2016 in [facility]  Date  06/02/16  Referring Provider  [name]      Visit Diagnosis: S/P CABG x 4  Patient's Home Medications on Admission:  Current Outpatient Prescriptions:  .  aspirin EC 81 MG tablet, Take 81 mg by mouth at bedtime. , Disp: , Rfl:  .  Choline Fenofibrate (FENOFIBRIC ACID) 45 MG CPDR, TAKE 1 CAPSULE ONE TIME DAILY (Patient taking differently: take 45mg s at bedtime), Disp: 90 capsule, Rfl: 2 .  clopidogrel (PLAVIX) 75 MG tablet, TAKE 1 TABLET DAILY (NEEDS APPOINTMENT FOR REFILLS.), Disp: 90 tablet, Rfl: 2 .  Coenzyme Q10 (CO Q10 PO), Take 30 mg by mouth daily. , Disp: , Rfl:  .  diclofenac (VOLTAREN) 75 MG EC tablet, Take 75 mg by mouth 2 (two) times daily as needed for moderate pain., Disp: , Rfl:  .  glimepiride (AMARYL) 4 MG tablet, Take 4 mg by mouth 2 (two) times daily. , Disp: , Rfl:  .  HYDROcodone-acetaminophen (NORCO) 10-325 MG tablet, Take 1 tablet by mouth every 6 (six) hours as needed for moderate pain or severe pain., Disp: , Rfl:  .  isosorbide mononitrate (IMDUR) 30 MG 24 hr tablet, Take 1 tablet (30 mg total) by mouth daily. For one month then stop. (Patient not taking: Reported on 06/02/2016), Disp: 30 tablet, Rfl: 0 .  metFORMIN (GLUCOPHAGE) 1000 MG tablet, Take 1,000 mg by mouth 2 (two) times daily with a meal., Disp: , Rfl:  .  metoprolol tartrate (LOPRESSOR) 25 MG tablet, Take 1 tablet (25 mg total) by mouth 2 (two) times daily., Disp: 60 tablet, Rfl: 1 .  Multiple Vitamin (MULTIVITAMIN) tablet, Take 1 tablet by mouth daily., Disp: , Rfl:  .  Omega-3 Fatty  Acids (FISH OIL) 1200 MG CAPS, Take 1 capsule by mouth 2 (two) times daily. , Disp: , Rfl:  .  oxyCODONE (OXY IR/ROXICODONE) 5 MG immediate release tablet, Take 5 mg by mouth every 4-6 hours PRN severe pain., Disp: 20 tablet, Rfl: 0 .  pravastatin (PRAVACHOL) 80 MG tablet, TAKE 1 TABLET EVERY DAY (Patient taking differently: take 80mg s at bedtime), Disp: 90 tablet, Rfl: 2 .  ramipril (ALTACE) 2.5 MG capsule, Take 1 capsule (2.5 mg total) by mouth daily., Disp: , Rfl:   Past Medical History: Past Medical History:  Diagnosis Date  . CAD (coronary artery disease)   . Diabetes ([place])   . Dyspnea   . Hyperlipidemia   . Hypertension   . Obesity     Tobacco Use: History  Smoking Status  . Never Smoker  Smokeless Tobacco  . Never Used    Labs: Recent Review Flowsheet Data    Labs for ITP Cardiac and Pulmonary Rehab Latest Ref Rng & Units 04/22/2016 04/23/2016 04/23/2016 04/23/2016 04/23/2016   Cholestrol 125 - 200 mg/dL - - - - -   LDLCALC <130 mg/dL - - - - -   HDL >=40 mg/dL - - - - -   Trlycerides <150 mg/dL - - - - -   Hemoglobin A1c 4.8 - 5.6 % - - - - -   PHART 7.350 - 7.450 7.338(L) 7.330(L) -  7.358 -   PCO2ART 32.0 - 48.0 mmHg 41.8 42.0 - 38.9 -   HCO3 20.0 - 28.0 mmol/L 22.5 22.1 - 21.9 -   TCO2 0 - 100 mmol/L 24 23 23 23 22    ACIDBASEDEF 0.0 - 2.0 mmol/L 3.0(H) 4.0(H) - 3.0(H) -   O2SAT % 90.0 97.0 - 90.0 -      Capillary Blood Glucose: Lab Results  Component Value Date   GLUCAP 135 (H) 04/27/2016   GLUCAP 136 (H) 04/27/2016   GLUCAP 129 (H) 04/26/2016   GLUCAP 102 (H) 04/26/2016   GLUCAP 150 (H) 04/26/2016     Exercise Target Goals:    Exercise Program Goal: Individual exercise prescription set with THRR, safety & activity barriers. Participant demonstrates ability to understand and report RPE using BORG scale, to self-measure pulse accurately, and to acknowledge the importance of the exercise prescription.  Exercise Prescription Goal: Starting with  aerobic activity 30 plus minutes a day, 3 days per week for initial exercise prescription. Provide home exercise prescription and guidelines that participant acknowledges understanding prior to discharge.  Activity Barriers & Risk Stratification:     Activity Barriers & Cardiac Risk Stratification - 06/02/16 1105      Activity Barriers & Cardiac Risk Stratification   Activity Barriers Arthritis   Cardiac Risk Stratification High      6 Minute Walk:     6 Minute Walk    Row Name 06/02/16 0959         6 Minute Walk   Phase Initial     Distance 1400 feet     Distance % Change 0 %     Walk Time 6 minutes     # of Rest Breaks 0     MPH 2.65     METS 3.03     RPE 9     Perceived Dyspnea  11     VO2 Peak 12.71     Symptoms No     Resting HR 99 bpm     Resting BP 126/76     Max Ex. HR 111 bpm     Max Ex. BP 138/80     2 Minute Post BP 128/78        Oxygen Initial Assessment:   Oxygen Re-Evaluation:   Oxygen Discharge (Final Oxygen Re-Evaluation):   Initial Exercise Prescription:     Initial Exercise Prescription - 06/02/16 1000      Date of Initial Exercise RX and Referring Provider   Date 06/02/16   Referring Provider [name]     Treadmill   MPH 1.3   Grade 0   Minutes 15   METs 1.9     NuStep   Level 2   SPM 20   Minutes 20   METs 2     Prescription Details   Frequency (times per week) 3   Duration Progress to 30 minutes of continuous aerobic without signs/symptoms of physical distress     Intensity   THRR REST +  30   THRR 40-80% of Max Heartrate [phone]   Ratings of Perceived Exertion 11-13   Perceived Dyspnea 0-4     Progression   Progression Continue progressive overload as per policy without signs/symptoms or physical distress.     Resistance Training   Training Prescription Yes   Weight 1   Reps 10-12      Perform Capillary Blood Glucose checks as needed.  Exercise Prescription Changes:      Exercise  Prescription Changes  Row Name 06/15/16 1400 07/13/16 1400 08/10/16 1300         Response to Exercise   Blood Pressure (Admit) 150/78 126/70 132/66     Blood Pressure (Exercise) 146/70 130/80 140/80     Blood Pressure (Exit) 118/62 112/62 110/62     Heart Rate (Admit) 77 bpm 87 bpm 86 bpm     Heart Rate (Exercise) 100 bpm 109 bpm 110 bpm     Heart Rate (Exit) 86 bpm 94 bpm 95 bpm     Rating of Perceived Exertion (Exercise) 7 8 11      Duration Progress to 30 minutes of continuous aerobic without signs/symptoms of physical distress Progress to 30 minutes of continuous aerobic without signs/symptoms of physical distress Progress to 30 minutes of  aerobic without signs/symptoms of physical distress     Intensity Rest + 30 Rest + 30 THRR unchanged       Progression   Progression Continue progressive overload as per policy without signs/symptoms or physical distress. Continue progressive overload as per policy without signs/symptoms or physical distress. Continue to progress workloads to maintain intensity without signs/symptoms of physical distress.       Resistance Training   Training Prescription Yes Yes Yes     Weight 2 3 5      Reps 10-12 10-12 10-15       Treadmill   MPH 2 2.7 3.6     Grade 0 0 1     Minutes 15 15 15      METs 2.5 3 4.4       NuStep   Level 2 3 3      SPM 11 37 26     Minutes 20 20 20      METs 3.65 3.65 3.66       Home Exercise Plan   Plans to continue exercise at [facility] (comment)     Frequency Add 2 additional days to program exercise sessions. Add 2 additional days to program exercise sessions. Add 2 additional days to program exercise sessions.       Exercise Review   Progression Yes Yes Yes        Exercise Comments:      Exercise Comments    Row Name 06/15/16 1448 07/13/16 1435 08/10/16 1327       Exercise Comments Patient is progressing well. Patient is proggressing well Patient has progressed very well on the treadmill and nustep.          Exercise Goals and Review:   Exercise Goals Re-Evaluation :     Exercise Goals Re-Evaluation    Row Name 08/11/16 0807             Exercise Goal Re-Evaluation   Exercise Goals Review Increase Strenth and Stamina;Increase Physical Activity       Comments Patient has gained strength and stamina. He is walking more now.        Expected Outcomes Patient will continue to progress in the program.            Discharge Exercise Prescription (Final Exercise Prescription Changes):     Exercise Prescription Changes - 08/10/16 1300      Response to Exercise   Blood Pressure (Admit) 132/66   Blood Pressure (Exercise) 140/80   Blood Pressure (Exit) 110/62   Heart Rate (Admit) 86 bpm   Heart Rate (Exercise) 110 bpm   Heart Rate (Exit) 95 bpm   Rating of Perceived Exertion (Exercise) 11   Duration Progress to 30  minutes of  aerobic without signs/symptoms of physical distress   Intensity THRR unchanged     Progression   Progression Continue to progress workloads to maintain intensity without signs/symptoms of physical distress.     Resistance Training   Training Prescription Yes   Weight 5   Reps 10-15     Treadmill   MPH 3.6   Grade 1   Minutes 15   METs 4.4     NuStep   Level 3   SPM 26   Minutes 20   METs 3.66     Home Exercise Plan   Plans to continue exercise at Home (comment)   Frequency Add 2 additional days to program exercise sessions.     Exercise Review   Progression Yes      Nutrition:  Target Goals: Understanding of nutrition guidelines, daily intake of sodium 1500mg , cholesterol 200mg , calories 30% from fat and 7% or less from saturated fats, daily to have 5 or more servings of fruits and vegetables.  Biometrics:     Pre Biometrics - 06/02/16 1003      Pre Biometrics   Height 6' (1.829 m)   Weight 210 lb 5.1 oz (95.4 kg)   Waist Circumference 39 inches   Hip Circumference 38.5 inches   Waist to Hip Ratio 1.01 %   BMI (Calculated)  28.6   Triceps Skinfold 14 mm   % Body Fat 26.5 %   Grip Strength 43.67 kg   Flexibility 11.5 in   Single Leg Stand 6 seconds       Nutrition Therapy Plan and Nutrition Goals:   Nutrition Discharge: Rate Your Plate Scores:     Nutrition Assessments - 06/02/16 1110      MEDFICTS Scores   Pre Score 36      Nutrition Goals Re-Evaluation:   Nutrition Goals Discharge (Final Nutrition Goals Re-Evaluation):   Psychosocial: Target Goals: Acknowledge presence or absence of significant depression and/or stress, maximize coping skills, provide positive support system. Participant is able to verbalize types and ability to use techniques and skills needed for reducing stress and depression.  Initial Review & Psychosocial Screening:     Initial Psych Review & Screening - 06/02/16 1113      Initial Review   Current issues with --  No concerns at this time     [place]? Yes     Barriers   Psychosocial barriers to participate in program There are no identifiable barriers or psychosocial needs.     Screening Interventions   Interventions Encouraged to exercise      Quality of Life Scores:     Quality of Life - 06/02/16 1004      Quality of Life Scores   Health/Function Pre 28.2 %   Socioeconomic Pre 30 %   Psych/Spiritual Pre 30 %   Family Pre 30 %   GLOBAL Pre 29.23 %      PHQ-9: Recent Review Flowsheet Data    Depression screen [facility] 2/9 06/02/2016   Decreased Interest 0   Down, Depressed, Hopeless 0   PHQ - 2 Score 0   Altered sleeping 0   Tired, decreased energy 0   Change in appetite 0   Feeling bad or failure about yourself  0   Trouble concentrating 0   Moving slowly or fidgety/restless 0   Suicidal thoughts 0   PHQ-9 Score 0     Interpretation of Total Score  Total Score Depression Severity:  1-4 = Minimal depression, 5-9 = Mild depression, 10-14 = Moderate depression, 15-19 = Moderately severe depression, 20-27 =  Severe depression   Psychosocial Evaluation and Intervention:     Psychosocial Evaluation - 06/02/16 1114      Psychosocial Evaluation & Interventions   Interventions Encouraged to exercise with the program and follow exercise prescription   Continue Psychosocial Services  No  QOL scores WNL: 29.23      Psychosocial Re-Evaluation:     Psychosocial Re-Evaluation    [place] Name 06/18/16 1044 07/15/16 1517 08/11/16 0809         Psychosocial Re-Evaluation   Current issues with  -  - None Identified     Comments Patient's QOL WNL at 29.23. Continues to have no psychosocial issues identified.  Patient continues to have no psychosocial issues identified.   -     Interventions Encouraged to attend Cardiac Rehabilitation for the exercise Encouraged to attend Cardiac Rehabilitation for the exercise  -     Continue Psychosocial Services  No No No Follow up required        Psychosocial Discharge (Final Psychosocial Re-Evaluation):     Psychosocial Re-Evaluation - 08/11/16 0809      Psychosocial Re-Evaluation   Current issues with None Identified   Continue Psychosocial Services  No Follow up required      Vocational Rehabilitation: Provide vocational rehab assistance to qualifying candidates.   Vocational Rehab Evaluation & Intervention:     Vocational Rehab - 06/02/16 1106      Initial Vocational Rehab Evaluation & Intervention   Assessment shows need for Vocational Rehabilitation No      Education: Education Goals: Education classes will be provided on a weekly basis, covering required topics. Participant will state understanding/return demonstration of topics presented.  Learning Barriers/Preferences:     Learning Barriers/Preferences - 06/02/16 1106      Learning Barriers/Preferences   Learning Barriers None   Learning Preferences Written Material;Video      Education Topics: Hypertension, Hypertension Reduction -Define heart disease and high blood pressure.  Discus how high blood pressure affects the body and ways to reduce high blood pressure.   Exercise and Your Heart -Discuss why it is important to exercise, the FITT principles of exercise, normal and abnormal responses to exercise, and how to exercise safely. Flowsheet Row CARDIAC REHAB PHASE II EXERCISE from 08/05/2016 in [facility]  Date  07/15/16  Educator  DC  Instruction Review Code  2- meets goals/outcomes      Angina -Discuss definition of angina, causes of angina, treatment of angina, and how to decrease risk of having angina. Flowsheet Row CARDIAC REHAB PHASE II EXERCISE from 08/05/2016 in [place]  Date  07/22/16  Educator  [place]  Instruction Review Code  2- meets goals/outcomes      Cardiac Medications -Review what the following cardiac medications are used for, how they affect the body, and side effects that may occur when taking the medications.  Medications include Aspirin, Beta blockers, calcium channel blockers, ACE Inhibitors, angiotensin receptor blockers, diuretics, digoxin, and antihyperlipidemics. Flowsheet Row CARDIAC REHAB PHASE II EXERCISE from 08/05/2016 in [place]  Date  07/29/16  Educator  DC  Instruction Review Code  2- meets goals/outcomes      Congestive Heart Failure -Discuss the definition of CHF, how to live with CHF, the signs and symptoms of CHF, and how keep track of weight and sodium intake. Flowsheet Row CARDIAC REHAB PHASE II EXERCISE  from 08/05/2016 in [place]  Date  08/05/16  Educator  DC  Instruction Review Code  2- meets goals/outcomes      Heart Disease and Intimacy -Discus the effect sexual activity has on the heart, how changes occur during intimacy as we age, and safety during sexual activity.   Smoking Cessation / COPD -Discuss different methods to quit smoking, the health benefits of quitting smoking, and the definition of  COPD.   Nutrition I: Fats -Discuss the types of cholesterol, what cholesterol does to the heart, and how cholesterol levels can be controlled.   Nutrition II: Labels -Discuss the different components of food labels and how to read food label   Heart Parts and Heart Disease -Discuss the anatomy of the heart, the pathway of blood circulation through the heart, and these are affected by heart disease. Flowsheet Row CARDIAC REHAB PHASE II EXERCISE from 08/05/2016 in [place]  Date  06/10/16  Educator  Russella Dar  Instruction Review Code  2- meets goals/outcomes      Stress I: Signs and Symptoms -Discuss the causes of stress, how stress may lead to anxiety and depression, and ways to limit stress. Flowsheet Row CARDIAC REHAB PHASE II EXERCISE from 08/05/2016 in [place]  Date  06/17/16  Educator  [place]  Instruction Review Code  2- meets goals/outcomes      Stress II: Relaxation -Discuss different types of relaxation techniques to limit stress. Flowsheet Row CARDIAC REHAB PHASE II EXERCISE from 08/05/2016 in [place]  Date  06/24/16  Educator  [place]  Instruction Review Code  2- meets goals/outcomes      Warning Signs of Stroke / TIA -Discuss definition of a stroke, what the signs and symptoms are of a stroke, and how to identify when someone is having stroke. Flowsheet Row CARDIAC REHAB PHASE II EXERCISE from 08/05/2016 in [place]  Date  07/01/16  Educator  DJ  Instruction Review Code  2- meets goals/outcomes      Knowledge Questionnaire Score:     Knowledge Questionnaire Score - 06/02/16 1106      Knowledge Questionnaire Score   Pre Score 22/24      Core Components/Risk Factors/Patient Goals at Admission:     Personal Goals and Risk Factors at Admission - 06/02/16 1110      Core Components/Risk Factors/Patient Goals on Admission    Weight Management Weight Maintenance    Increase Strength and Stamina Yes   Intervention Provide advice, education, support and counseling about physical activity/exercise needs.;Develop an individualized exercise prescription for aerobic and resistive training based on initial evaluation findings, risk stratification, comorbidities and participant's personal goals.   Expected Outcomes Achievement of increased cardiorespiratory fitness and enhanced flexibility, muscular endurance and strength shown through measurements of functional capacity and personal statement of participant.   Personal Goal Other Yes   Personal Goal Get physically active again, gain some muscle back   Intervention Attend Cardiac Rehab 3xweek and to supplement exercise 2xweek at home.    Expected Outcomes Achieve personal goals.       Core Components/Risk Factors/Patient Goals Review:      Goals and Risk Factor Review    Row Name 06/02/16 1113 06/18/16 1042 07/15/16 1515 08/11/16 0805       Core Components/Risk Factors/Patient Goals Review   Personal Goals Review Weight Management/Obesity;Increase Strength and Stamina Weight Management/Obesity;Increase Strength and Stamina  Gain some muscle back; get physically active again. Weight  Management/Obesity;Increase Strength and Stamina  Gain muscle back; get physically active again. Weight Management/Obesity;Diabetes  Get physically active again; gain some muscle back.     Review  - Patient has attended 5 sessions with some progression. Will continue to monitor for progress. Patient has attended 16 sessions. He has gained 5.6 lbs. He is progressing well with increased strength and stamina. He says he feels stronger.  Patient has completed 27 sessions gaining 7.6 lbs. He says he feels better and stonger since starting the program. He has progressed well in the program.     Expected Outcomes  - Patient will continue to attend sessions and complete the program meeting her personal goals.  Patient will complete the  program meeting his personal goals.  Patient will complete the program meeting his personal goals.        Core Components/Risk Factors/Patient Goals at Discharge (Final Review):      Goals and Risk Factor Review - 08/11/16 0805      Core Components/Risk Factors/Patient Goals Review   Personal Goals Review Weight Management/Obesity;Diabetes  Get physically active again; gain some muscle back.    Review Patient has completed 27 sessions gaining 7.6 lbs. He says he feels better and stonger since starting the program. He has progressed well in the program.    Expected Outcomes Patient will complete the program meeting his personal goals.       ITP Comments:   Comments: ITP 30 Day REVIEW Patient doing well in the program. Will continue to monitor for progress.

## 2016-08-12 ENCOUNTER — Encounter (HOSPITAL_COMMUNITY)
Admission: RE | Admit: 2016-08-12 | Discharge: 2016-08-12 | Disposition: A | Discharge status: Home / Self Care | Payer: Medicare HMO | Source: Ambulatory Visit | Attending: Cardiovascular Disease | Admitting: Cardiovascular Disease

## 2016-08-12 DIAGNOSIS — E119 Type 2 diabetes mellitus without complications: Secondary | ICD-10-CM | POA: Diagnosis not present

## 2016-08-12 DIAGNOSIS — Z7902 Long term (current) use of antithrombotics/antiplatelets: Secondary | ICD-10-CM | POA: Diagnosis not present

## 2016-08-12 DIAGNOSIS — E669 Obesity, unspecified: Secondary | ICD-10-CM | POA: Diagnosis not present

## 2016-08-12 DIAGNOSIS — I2510 Atherosclerotic heart disease of native coronary artery without angina pectoris: Secondary | ICD-10-CM | POA: Diagnosis not present

## 2016-08-12 DIAGNOSIS — E785 Hyperlipidemia, unspecified: Secondary | ICD-10-CM | POA: Diagnosis not present

## 2016-08-12 DIAGNOSIS — Z7982 Long term (current) use of aspirin: Secondary | ICD-10-CM | POA: Diagnosis not present

## 2016-08-12 DIAGNOSIS — Z951 Presence of aortocoronary bypass graft: Secondary | ICD-10-CM

## 2016-08-12 DIAGNOSIS — I10 Essential (primary) hypertension: Secondary | ICD-10-CM | POA: Diagnosis not present

## 2016-08-12 DIAGNOSIS — Z79899 Other long term (current) drug therapy: Secondary | ICD-10-CM | POA: Diagnosis not present

## 2016-08-12 NOTE — Progress Notes (Signed)
Daily Session Note  Patient Details  Name: ARNO CULLERS MRN: [id number] Date of Birth: 10-20-1950 Referring Provider:   Flowsheet Row CARDIAC REHAB PHASE II ORIENTATION from 06/02/2016 in [place]  Referring Provider  [name]      Encounter Date: 08/12/2016  Check In:     Session Check In - 08/12/16 0930      Check-In   Location [facility]   Staff Present Suzanne Boron, BS, EP, Exercise Physiologist;Shevaun Lovan Wynetta Emery, RN, BSN   Supervising physician immediately available to respond to emergencies See telemetry face sheet for immediately available MD   Medication changes reported     No   Fall or balance concerns reported    No   Warm-up and Cool-down Performed as group-led instruction   Resistance Training Performed Yes   VAD Patient? No     Pain Assessment   Currently in Pain? No/denies   Pain Score 0-No pain   Multiple Pain Sites No      Capillary Blood Glucose: No results found for this or any previous visit (from the past 24 hour(s)).    History  Smoking Status  . Never Smoker  Smokeless Tobacco  . Never Used    Goals Met:  Independence with exercise equipment Exercise tolerated well No report of cardiac concerns or symptoms Strength training completed today  Goals Unmet:  Not Applicable  Comments: Check out 1030.   [name] is Medical Director for [facility] Cardiac and Pulmonary Rehab.

## 2016-08-14 ENCOUNTER — Encounter (HOSPITAL_COMMUNITY)
Admission: RE | Admit: 2016-08-14 | Discharge: 2016-08-14 | Disposition: A | Discharge status: Home / Self Care | Payer: Medicare HMO | Source: Ambulatory Visit | Attending: Cardiovascular Disease | Admitting: Cardiovascular Disease

## 2016-08-14 DIAGNOSIS — Z7902 Long term (current) use of antithrombotics/antiplatelets: Secondary | ICD-10-CM | POA: Diagnosis not present

## 2016-08-14 DIAGNOSIS — Z951 Presence of aortocoronary bypass graft: Secondary | ICD-10-CM | POA: Diagnosis not present

## 2016-08-14 DIAGNOSIS — E119 Type 2 diabetes mellitus without complications: Secondary | ICD-10-CM | POA: Diagnosis not present

## 2016-08-14 DIAGNOSIS — Z79899 Other long term (current) drug therapy: Secondary | ICD-10-CM | POA: Diagnosis not present

## 2016-08-14 DIAGNOSIS — Z7982 Long term (current) use of aspirin: Secondary | ICD-10-CM | POA: Diagnosis not present

## 2016-08-14 DIAGNOSIS — I2510 Atherosclerotic heart disease of native coronary artery without angina pectoris: Secondary | ICD-10-CM | POA: Diagnosis not present

## 2016-08-14 DIAGNOSIS — E669 Obesity, unspecified: Secondary | ICD-10-CM | POA: Diagnosis not present

## 2016-08-14 DIAGNOSIS — E785 Hyperlipidemia, unspecified: Secondary | ICD-10-CM | POA: Diagnosis not present

## 2016-08-14 DIAGNOSIS — I10 Essential (primary) hypertension: Secondary | ICD-10-CM | POA: Diagnosis not present

## 2016-08-14 NOTE — Progress Notes (Signed)
Daily Session Note  Patient Details  Name: Jonathan Shepard MRN: [id number] Date of Birth: 04-07-1951 Referring Provider:   Flowsheet Row CARDIAC REHAB PHASE II ORIENTATION from 06/02/2016 in [place]  Referring Provider  [name]      Encounter Date: 08/14/2016  Check In:     Session Check In - 08/14/16 0930      Check-In   Location [facility]   Staff Present Russella Dar, MS, EP, [facility], Exercise Physiologist;Gregory Luther Parody, BS, EP, Exercise Physiologist   Supervising physician immediately available to respond to emergencies See telemetry face sheet for immediately available MD   Medication changes reported     No   Fall or balance concerns reported    No   Warm-up and Cool-down Performed as group-led instruction   Resistance Training Performed Yes   VAD Patient? No     Pain Assessment   Currently in Pain? No/denies   Pain Score 0-No pain   Multiple Pain Sites No      Capillary Blood Glucose: No results found for this or any previous visit (from the past 24 hour(s)).    History  Smoking Status  . Never Smoker  Smokeless Tobacco  . Never Used    Goals Met:  Independence with exercise equipment Exercise tolerated well  Goals Unmet:  Not Applicable  Comments: Check out: 1030   [name] is Medical Director for [place] and Pulmonary Rehab.

## 2016-08-17 ENCOUNTER — Encounter (HOSPITAL_COMMUNITY)
Admission: RE | Admit: 2016-08-17 | Discharge: 2016-08-17 | Disposition: A | Discharge status: Home / Self Care | Payer: Medicare HMO | Source: Ambulatory Visit | Attending: Cardiovascular Disease | Admitting: Cardiovascular Disease

## 2016-08-17 DIAGNOSIS — I2510 Atherosclerotic heart disease of native coronary artery without angina pectoris: Secondary | ICD-10-CM | POA: Diagnosis not present

## 2016-08-17 DIAGNOSIS — E785 Hyperlipidemia, unspecified: Secondary | ICD-10-CM | POA: Diagnosis not present

## 2016-08-17 DIAGNOSIS — E669 Obesity, unspecified: Secondary | ICD-10-CM | POA: Diagnosis not present

## 2016-08-17 DIAGNOSIS — I10 Essential (primary) hypertension: Secondary | ICD-10-CM | POA: Diagnosis not present

## 2016-08-17 DIAGNOSIS — Z951 Presence of aortocoronary bypass graft: Secondary | ICD-10-CM | POA: Diagnosis not present

## 2016-08-17 DIAGNOSIS — Z7982 Long term (current) use of aspirin: Secondary | ICD-10-CM | POA: Diagnosis not present

## 2016-08-17 DIAGNOSIS — Z7902 Long term (current) use of antithrombotics/antiplatelets: Secondary | ICD-10-CM | POA: Diagnosis not present

## 2016-08-17 DIAGNOSIS — E119 Type 2 diabetes mellitus without complications: Secondary | ICD-10-CM | POA: Diagnosis not present

## 2016-08-17 DIAGNOSIS — Z79899 Other long term (current) drug therapy: Secondary | ICD-10-CM | POA: Diagnosis not present

## 2016-08-17 NOTE — Progress Notes (Signed)
Daily Session Note  Patient Details  Name: AMOR PACKARD MRN: [id number] Date of Birth: 01/12/1951 Referring Provider:   Flowsheet Row CARDIAC REHAB PHASE II ORIENTATION from 06/02/2016 in [place]  Referring Provider  [name]      Encounter Date: 08/17/2016  Check In:     Session Check In - 08/17/16 0930      Check-In   Location [facility]   Staff Present Russella Dar, MS, EP, [facility], Exercise Physiologist;Gregory Luther Parody, BS, EP, Exercise Physiologist   Supervising physician immediately available to respond to emergencies See telemetry face sheet for immediately available MD   Medication changes reported     No   Fall or balance concerns reported    No   Warm-up and Cool-down Performed as group-led instruction   Resistance Training Performed Yes   VAD Patient? No     Pain Assessment   Currently in Pain? No/denies   Pain Score 0-No pain   Multiple Pain Sites No      Capillary Blood Glucose: No results found for this or any previous visit (from the past 24 hour(s)).    History  Smoking Status  . Never Smoker  Smokeless Tobacco  . Never Used    Goals Met:  Independence with exercise equipment Exercise tolerated well No report of cardiac concerns or symptoms Strength training completed today  Goals Unmet:  Not Applicable  Comments: Check out: 10:30   [name] is Medical Director for [place] and Pulmonary Rehab.

## 2016-08-19 ENCOUNTER — Encounter (HOSPITAL_COMMUNITY)
Admission: RE | Admit: 2016-08-19 | Discharge: 2016-08-19 | Disposition: A | Discharge status: Home / Self Care | Payer: Medicare HMO | Source: Ambulatory Visit | Attending: Cardiovascular Disease | Admitting: Cardiovascular Disease

## 2016-08-19 DIAGNOSIS — E669 Obesity, unspecified: Secondary | ICD-10-CM | POA: Diagnosis not present

## 2016-08-19 DIAGNOSIS — I10 Essential (primary) hypertension: Secondary | ICD-10-CM | POA: Diagnosis not present

## 2016-08-19 DIAGNOSIS — E119 Type 2 diabetes mellitus without complications: Secondary | ICD-10-CM | POA: Diagnosis not present

## 2016-08-19 DIAGNOSIS — Z7982 Long term (current) use of aspirin: Secondary | ICD-10-CM | POA: Diagnosis not present

## 2016-08-19 DIAGNOSIS — I2510 Atherosclerotic heart disease of native coronary artery without angina pectoris: Secondary | ICD-10-CM | POA: Diagnosis not present

## 2016-08-19 DIAGNOSIS — Z7902 Long term (current) use of antithrombotics/antiplatelets: Secondary | ICD-10-CM | POA: Diagnosis not present

## 2016-08-19 DIAGNOSIS — E785 Hyperlipidemia, unspecified: Secondary | ICD-10-CM | POA: Diagnosis not present

## 2016-08-19 DIAGNOSIS — Z951 Presence of aortocoronary bypass graft: Secondary | ICD-10-CM | POA: Diagnosis not present

## 2016-08-19 DIAGNOSIS — Z79899 Other long term (current) drug therapy: Secondary | ICD-10-CM | POA: Diagnosis not present

## 2016-08-19 NOTE — Progress Notes (Signed)
Daily Session Note  Patient Details  Name: Jonathan Shepard MRN: [id number] Date of Birth: 01/22/1951 Referring Provider:   Flowsheet Row CARDIAC REHAB PHASE II ORIENTATION from 06/02/2016 in [place]  Referring Provider  [name]      Encounter Date: 08/19/2016  Check In:     Session Check In - 08/19/16 0930      Check-In   Location [facility]   Staff Present Suzanne Boron, BS, EP, Exercise Physiologist;Ayce Pietrzyk Wynetta Emery, RN, BSN   Supervising physician immediately available to respond to emergencies See telemetry face sheet for immediately available MD   Medication changes reported     No   Fall or balance concerns reported    No   Warm-up and Cool-down Performed as group-led instruction   Resistance Training Performed Yes   VAD Patient? No     Pain Assessment   Currently in Pain? No/denies   Pain Score 0-No pain   Multiple Pain Sites No      Capillary Blood Glucose: No results found for this or any previous visit (from the past 24 hour(s)).    History  Smoking Status  . Never Smoker  Smokeless Tobacco  . Never Used    Goals Met:  Independence with exercise equipment Exercise tolerated well No report of cardiac concerns or symptoms Strength training completed today  Goals Unmet:  Not Applicable  Comments: Check out 1030.   [name] is Medical Director for [facility] Cardiac and Pulmonary Rehab.

## 2016-08-21 ENCOUNTER — Encounter (HOSPITAL_COMMUNITY): Payer: Medicare HMO

## 2016-08-24 ENCOUNTER — Encounter (HOSPITAL_COMMUNITY)
Admission: RE | Admit: 2016-08-24 | Discharge: 2016-08-24 | Disposition: A | Discharge status: Home / Self Care | Payer: Medicare HMO | Source: Ambulatory Visit | Attending: Cardiovascular Disease | Admitting: Cardiovascular Disease

## 2016-08-24 DIAGNOSIS — Z79899 Other long term (current) drug therapy: Secondary | ICD-10-CM | POA: Diagnosis not present

## 2016-08-24 DIAGNOSIS — Z951 Presence of aortocoronary bypass graft: Secondary | ICD-10-CM | POA: Diagnosis not present

## 2016-08-24 DIAGNOSIS — Z7982 Long term (current) use of aspirin: Secondary | ICD-10-CM | POA: Diagnosis not present

## 2016-08-24 DIAGNOSIS — E785 Hyperlipidemia, unspecified: Secondary | ICD-10-CM | POA: Diagnosis not present

## 2016-08-24 DIAGNOSIS — I2510 Atherosclerotic heart disease of native coronary artery without angina pectoris: Secondary | ICD-10-CM | POA: Diagnosis not present

## 2016-08-24 DIAGNOSIS — Z7902 Long term (current) use of antithrombotics/antiplatelets: Secondary | ICD-10-CM | POA: Diagnosis not present

## 2016-08-24 DIAGNOSIS — I10 Essential (primary) hypertension: Secondary | ICD-10-CM | POA: Diagnosis not present

## 2016-08-24 DIAGNOSIS — E669 Obesity, unspecified: Secondary | ICD-10-CM | POA: Diagnosis not present

## 2016-08-24 DIAGNOSIS — E119 Type 2 diabetes mellitus without complications: Secondary | ICD-10-CM | POA: Diagnosis not present

## 2016-08-24 NOTE — Progress Notes (Signed)
Daily Session Note  Patient Details  Name: Jonathan Shepard MRN: [id number] Date of Birth: 05/04/1951 Referring Provider:     CARDIAC REHAB PHASE II ORIENTATION from 06/02/2016 in [place] [facility]IL[facility]  Referring Provider  Dr. Croitoru      Encounter Date: 08/24/2016  Check In:     Session Check In - 08/24/16 1545      Check-In   Location [facility]   Staff Present Diane Coad, MS, EP, [facility], Exercise Physiologist;Debra Johnson, RN, BSN   Supervising physician immediately available to respond to emergencies See telemetry face sheet for immediately available MD   Medication changes reported     No   Fall or balance concerns reported    No   Warm-up and Cool-down Performed as group-led instruction   Resistance Training Performed Yes   VAD Patient? No     Pain Assessment   Currently in Pain? No/denies   Pain Score 0-No pain   Multiple Pain Sites No      Capillary Blood Glucose: No results found for this or any previous visit (from the past 24 hour(s)).    History  Smoking Status  . Never Smoker  Smokeless Tobacco  . Never Used    Goals Met:  Independence with exercise equipment Exercise tolerated well No report of cardiac concerns or symptoms Strength training completed today  Goals Unmet:  Not Applicable  Comments: Check out 1645.   Dr. Suresh Koneswaran is Medical Director for [place] [facility] Re[facility]. 

## 2016-08-24 NOTE — Progress Notes (Signed)
Daily Session Note  Patient Details  Name: Jonathan Shepard MRN: [id number] Date of Birth: 09-Jun-1950 Referring Provider:     CARDIAC REHAB PHASE II ORIENTATION from 06/02/2016 in [place]  Referring Provider  [name]      Encounter Date: 08/24/2016  Check In:     Session Check In - 08/24/16 0930      Check-In   Location [facility]   Staff Present Russella Dar, MS, EP, [facility], Exercise Physiologist;Gregory Luther Parody, BS, EP, Exercise Physiologist   Supervising physician immediately available to respond to emergencies See telemetry face sheet for immediately available MD   Medication changes reported     No   Fall or balance concerns reported    No   Warm-up and Cool-down Performed as group-led instruction   Resistance Training Performed Yes   VAD Patient? No     Pain Assessment   Currently in Pain? No/denies   Pain Score 0-No pain   Multiple Pain Sites No      Capillary Blood Glucose: No results found for this or any previous visit (from the past 24 hour(s)).    History  Smoking Status  . Never Smoker  Smokeless Tobacco  . Never Used    Goals Met:  Independence with exercise equipment Exercise tolerated well No report of cardiac concerns or symptoms Strength training completed today  Goals Unmet:  Not Applicable  Comments: Check out: 10:30   [name] is Medical Director for [place] and Pulmonary Rehab.

## 2016-08-25 ENCOUNTER — Ambulatory Visit (INDEPENDENT_AMBULATORY_CARE_PROVIDER_SITE_OTHER): Payer: Medicare HMO | Admitting: Cardiovascular Disease

## 2016-08-25 ENCOUNTER — Encounter: Payer: Self-pay | Admitting: Cardiovascular Disease

## 2016-08-25 VITALS — BP 122/78 | HR 72 | Ht 72.0 in | Wt 218.0 lb

## 2016-08-25 DIAGNOSIS — Z79899 Other long term (current) drug therapy: Secondary | ICD-10-CM

## 2016-08-25 DIAGNOSIS — E785 Hyperlipidemia, unspecified: Secondary | ICD-10-CM | POA: Diagnosis not present

## 2016-08-25 DIAGNOSIS — I739 Peripheral vascular disease, unspecified: Secondary | ICD-10-CM | POA: Diagnosis not present

## 2016-08-25 DIAGNOSIS — E1151 Type 2 diabetes mellitus with diabetic peripheral angiopathy without gangrene: Secondary | ICD-10-CM

## 2016-08-25 DIAGNOSIS — I2510 Atherosclerotic heart disease of native coronary artery without angina pectoris: Secondary | ICD-10-CM | POA: Diagnosis not present

## 2016-08-25 NOTE — Progress Notes (Signed)
Cardiology Office Note    Date:  08/29/2016   ID:  KIEL COCKERELL, DOB 08-31-1950, MRN [id number]  PCP:  [name]., MD  Cardiologist:   Sanda Klein, MD   Chief Complaint  Patient presents with  . Appointment    CP discomfort. tiredness.     History of Present Illness:  Jonathan Shepard is a 66 y.o. male with long-standing history of coronary artery disease (RCA and LAD stents 2002 and 2008,CABG Nov 2017 -  LIMA to LAD, SVG to diagonal, SVG to PDA, left radial to OM 2, Dr. Roxan Hockey) who presents in follow-up after undergoing 4 vessel bypass surgery roughly 4 months ago.  He has done quite well since then and has no problems with angina pectoris, exertional dyspnea, dizziness, syncope, palpitations, leg edema or focal neurological deficits. He had managed to loose some weight and is no longer obese (although he is gaining some back and today BMI is just under 30). He is still working full-time. Glycemic control has improved substantially. His LDL cholesterol is excellent, but has always he still has a rather low HDL.  Initial presentation in 2002 - mid right coronary Hepacoat 3.0x13 stent.  In May 2008 had restenosis in the right coronary artery stent and "sandwich stenting" - Liberte 3.75 stent.  In December 2008 had cutting balloon angioplasty for in stent restenosis. In the same setting received a drug-eluting 3 x 15 mm Promus stent in the proximal LAD artery for a de novo lesion. July 2014 a nuclear stress test performed for similar complaints of exertional discomfort was normal. November 2017 worsening exertional angina led to angiography and four-vessel bypass surgery.  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 He has well-controlled diabetes mellitus and mixed hyperlipidemia   Past Medical History:  Diagnosis Date  . CAD (coronary artery  disease)   . Diabetes ([place])   . Dyspnea   . Hyperlipidemia   . Hypertension   . Obesity     Past Surgical History:  Procedure Laterality Date  . CARDIAC CATHETERIZATION N/A 04/14/2016   Procedure: Left Heart Cath and Coronary Angiography;  Surgeon: Peter M Martinique, MD;  Location: [place] CV LAB;  Service: Cardiovascular;  Laterality: N/A;  . CORONARY ARTERY BYPASS GRAFT N/A 04/22/2016   Procedure: CORONARY ARTERY BYPASS GRAFTING (CABG)x4 with endoscopic harvesting of right saphenous vein SVG to diagonal SVG to PDA Left Radial artery to OM 2 LIMA to LAD;  Surgeon: Melrose Nakayama, MD;  Location: [place];  Service: Open Heart Surgery;  Laterality: N/A;  . RADIAL ARTERY HARVEST Left 04/22/2016   Procedure: LEFT RADIAL ARTERY HARVEST;  Surgeon: Melrose Nakayama, MD;  Location: [place];  Service: Open Heart Surgery;  Laterality: Left;  . TEE WITHOUT CARDIOVERSION N/A 04/22/2016   Procedure: TRANSESOPHAGEAL ECHOCARDIOGRAM (TEE);  Surgeon: Melrose Nakayama, MD;  Location: [facility];  Service: Open Heart Surgery;  Laterality: N/A;    Current Medications: Outpatient Medications Prior to Visit  Medication Sig Dispense Refill  . aspirin EC 81 MG tablet Take 81 mg by mouth at bedtime.     . Choline Fenofibrate (FENOFIBRIC ACID) 45 MG CPDR TAKE 1 CAPSULE ONE TIME DAILY (Patient taking differently: take 45mg s at bedtime) 90 capsule 2  . clopidogrel (PLAVIX) 75 MG tablet TAKE 1 TABLET DAILY (NEEDS APPOINTMENT FOR REFILLS.) 90 tablet 2  . Coenzyme Q10 (CO Q10  PO) Take 30 mg by mouth daily.     . diclofenac (VOLTAREN) 75 MG EC tablet Take 75 mg by mouth 2 (two) times daily as needed for moderate pain.    Marland Kitchen glimepiride (AMARYL) 4 MG tablet Take 4 mg by mouth 2 (two) times daily.     Marland Kitchen HYDROcodone-acetaminophen (NORCO) 10-325 MG tablet Take 1 tablet by mouth every 6 (six) hours as needed for moderate pain or severe pain.    . isosorbide mononitrate (IMDUR) 30 MG 24 hr tablet Take 1 tablet (30 mg  total) by mouth daily. For one month then stop. 30 tablet 0  . metFORMIN (GLUCOPHAGE) 1000 MG tablet Take 1,000 mg by mouth 2 (two) times daily with a meal.    . metoprolol tartrate (LOPRESSOR) 25 MG tablet Take 1 tablet (25 mg total) by mouth 2 (two) times daily. 60 tablet 1  . Multiple Vitamin (MULTIVITAMIN) tablet Take 1 tablet by mouth daily.    . Omega-3 Fatty Acids (FISH OIL) 1200 MG CAPS Take 1 capsule by mouth 2 (two) times daily.     Marland Kitchen oxyCODONE (OXY IR/ROXICODONE) 5 MG immediate release tablet Take 5 mg by mouth every 4-6 hours PRN severe pain. 20 tablet 0  . pravastatin (PRAVACHOL) 80 MG tablet TAKE 1 TABLET EVERY DAY (Patient taking differently: take 80mg s at bedtime) 90 tablet 2  . ramipril (ALTACE) 2.5 MG capsule Take 1 capsule (2.5 mg total) by mouth daily.     No facility-administered medications prior to visit.      Allergies:   Patient has no known allergies.   Social History   Social History  . Marital status: Married    Spouse name: N/A  . Number of children: N/A  . Years of education: N/A   Social History Main Topics  . Smoking status: Never Smoker  . Smokeless tobacco: Never Used  . Alcohol use No  . Drug use: No  . Sexual activity: Not Asked   Other Topics Concern  . None   Social History Narrative   Has 3 children   Has 2 grandchildren     Family History:  The patient's family history includes Coronary artery disease (age of onset: 60) in his brother; Coronary artery disease (age of onset: 25) in his father.   ROS:   Please see the history of present illness.    ROS All other systems reviewed and are negative.   PHYSICAL EXAM:   VS:  BP 122/78   Pulse 72   Ht 6' (1.829 m)   Wt 98.9 kg (218 lb)   SpO2 97%   BMI 29.57 kg/m    GEN: Well nourished, well developed, in no acute distress  HEENT: normal  Neck: no JVD, carotid bruits, or masses Cardiac: RRR; no murmurs, rubs, or gallops,no edema , well-healed sternotomy Respiratory:  clear to  auscultation bilaterally, normal work of breathing GI: soft, nontender, nondistended, + BS MS: no deformity or atrophy  Skin: warm and dry, no rash Neuro:  Alert and Oriented x 3, Strength and sensation are intact Psych: euthymic mood, full affect  Wt Readings from Last 3 Encounters:  08/25/16 98.9 kg (218 lb)  06/02/16 95.4 kg (210 lb 5.1 oz)  05/19/16 93.4 kg (206 lb)      Studies/Labs Reviewed:   EKG:  EKG is ordered today.  The ekg ordered today demonstrates Normal sinus rhythm, normal tracing, QTC 409 ms  Recent Labs: July 2017 glucose 197, hemoglobin 13.9, creatinine 0.92, BUN 22,  platelets 191 (Dr. Gerarda Fraction)  Lipid Panel    Component Value Date/Time   CHOL 150 08/28/2016 0749   TRIG 218 (H) 08/28/2016 0749   HDL 32 (L) 08/28/2016 0749   CHOLHDL 4.7 08/28/2016 0749   VLDL 44 (H) 08/28/2016 0749   LDLCALC 74 08/28/2016 0749   July 2017: LDL cholesterol 78, total cholesterol 165, but also triglycerides 244 and borderline HDL 38.  ASSESSMENT:    1. Dyslipidemia   2. Coronary artery disease involving native coronary artery of native heart without angina pectoris   3. PAD (peripheral artery disease) ([place])   4. Type 2 diabetes mellitus with diabetic peripheral angiopathy without gangrene, without long-term current use of insulin ([place])   5. Medication management      PLAN:  In order of problems listed above: 1. CAD: No angina pectoris. Will stop his isosorbide mononitrate, continue the metoprolol.  Continue aspirin, clopidogrel, statin. 2. PAD: Asymptomatic 3. DM: excellent control, target A1c less than 7% 4. HLP: Mildly elevated triglycerides and low HDL persist despite better glycemic control. Encourage weight loss and daily physical exercise. LDL and total cholesterol parameters close to target.    Medication Adjustments/Labs and Tests Ordered: Current medicines are reviewed at length with the patient today.  Concerns regarding medicines are outlined above.   Medication changes, Labs and Tests ordered today are listed in the Patient Instructions below. Patient Instructions  Medication Instructions: Dr Sallyanne Kuster recommends that you continue on your current medications as directed. Please refer to the Current Medication list given to you today.  Labwork: Your physician recommends that you return for lab work at your earliest [place].  Testing/Procedures: NONE ORDERED  Follow-up: Dr Sallyanne Kuster recommends that you schedule a follow-up appointment in 6 months. You will receive a reminder letter in the mail two months in advance. If you don't receive a letter, please call our office to schedule the follow-up appointment.  If you need a refill on your cardiac medications before your next appointment, please call your pharmacy.    Signed, Sanda Klein, MD  08/29/2016 1:50 PM    [facility] [place], [place], [place]  [address] Phone: [phone]; Fax: [phone]

## 2016-08-25 NOTE — Patient Instructions (Signed)
Medication Instructions: Dr Croitoru recommends that you continue on your current medications as directed. Please refer to the Current Medication list given to you today.  Labwork: Your physician recommends that you return for lab work at your earliest convenience - FASTING.  Testing/Procedures: NONE ORDERED  Follow-up: Dr Croitoru recommends that you schedule a follow-up appointment in 6 months. You will receive a reminder letter in the mail two months in advance. If you don't receive a letter, please call our office to schedule the follow-up appointment.  If you need a refill on your cardiac medications before your next appointment, please call your pharmacy. 

## 2016-08-26 ENCOUNTER — Encounter (HOSPITAL_COMMUNITY)
Admission: RE | Admit: 2016-08-26 | Discharge: 2016-08-26 | Disposition: A | Discharge status: Home / Self Care | Payer: Medicare HMO | Source: Ambulatory Visit | Attending: Cardiovascular Disease | Admitting: Cardiovascular Disease

## 2016-08-26 DIAGNOSIS — Z79899 Other long term (current) drug therapy: Secondary | ICD-10-CM | POA: Diagnosis not present

## 2016-08-26 DIAGNOSIS — Z7902 Long term (current) use of antithrombotics/antiplatelets: Secondary | ICD-10-CM | POA: Diagnosis not present

## 2016-08-26 DIAGNOSIS — I2510 Atherosclerotic heart disease of native coronary artery without angina pectoris: Secondary | ICD-10-CM | POA: Diagnosis not present

## 2016-08-26 DIAGNOSIS — Z951 Presence of aortocoronary bypass graft: Secondary | ICD-10-CM | POA: Diagnosis not present

## 2016-08-26 DIAGNOSIS — E785 Hyperlipidemia, unspecified: Secondary | ICD-10-CM | POA: Diagnosis not present

## 2016-08-26 DIAGNOSIS — I10 Essential (primary) hypertension: Secondary | ICD-10-CM | POA: Diagnosis not present

## 2016-08-26 DIAGNOSIS — E119 Type 2 diabetes mellitus without complications: Secondary | ICD-10-CM | POA: Diagnosis not present

## 2016-08-26 DIAGNOSIS — E669 Obesity, unspecified: Secondary | ICD-10-CM | POA: Diagnosis not present

## 2016-08-26 DIAGNOSIS — Z7982 Long term (current) use of aspirin: Secondary | ICD-10-CM | POA: Diagnosis not present

## 2016-08-26 NOTE — Progress Notes (Signed)
Daily Session Note  Patient Details  Name: Jonathan Shepard MRN: [id number] Date of Birth: Jun 08, 1951 Referring Provider:     [place] from 06/02/2016 in [place]  Referring Provider  [name]      Encounter Date: 08/26/2016  Check In:     Session Check In - 08/26/16 0930      Check-In   Location [facility]   Staff Present Diane Angelina Pih, MS, EP, [facility], Exercise Physiologist;Debra Wynetta Emery, RN, BSN;Aneudy Champlain, BS, EP, Exercise Physiologist   Supervising physician immediately available to respond to emergencies See telemetry face sheet for immediately available MD   Medication changes reported     No   Fall or balance concerns reported    No   Warm-up and Cool-down Performed as group-led instruction   Resistance Training Performed Yes   VAD Patient? No     Pain Assessment   Currently in Pain? No/denies   Pain Score 0-No pain   Multiple Pain Sites No      Capillary Blood Glucose: No results found for this or any previous visit (from the past 24 hour(s)).    History  Smoking Status  . Never Smoker  Smokeless Tobacco  . Never Used    Goals Met:  Independence with exercise equipment Exercise tolerated well No report of cardiac concerns or symptoms Strength training completed today  Goals Unmet:  Not Applicable  Comments: Check out 1030   [name] is Medical Director for [place] and Pulmonary Rehab.

## 2016-08-28 ENCOUNTER — Encounter (HOSPITAL_COMMUNITY)
Admission: RE | Admit: 2016-08-28 | Discharge: 2016-08-28 | Disposition: A | Discharge status: Home / Self Care | Payer: Medicare HMO | Source: Ambulatory Visit | Attending: Cardiovascular Disease | Admitting: Cardiovascular Disease

## 2016-08-28 DIAGNOSIS — Z79899 Other long term (current) drug therapy: Secondary | ICD-10-CM | POA: Diagnosis not present

## 2016-08-28 DIAGNOSIS — Z7982 Long term (current) use of aspirin: Secondary | ICD-10-CM | POA: Diagnosis not present

## 2016-08-28 DIAGNOSIS — Z951 Presence of aortocoronary bypass graft: Secondary | ICD-10-CM | POA: Diagnosis not present

## 2016-08-28 DIAGNOSIS — E119 Type 2 diabetes mellitus without complications: Secondary | ICD-10-CM | POA: Diagnosis not present

## 2016-08-28 DIAGNOSIS — Z7902 Long term (current) use of antithrombotics/antiplatelets: Secondary | ICD-10-CM | POA: Diagnosis not present

## 2016-08-28 DIAGNOSIS — E785 Hyperlipidemia, unspecified: Secondary | ICD-10-CM | POA: Diagnosis not present

## 2016-08-28 DIAGNOSIS — E1151 Type 2 diabetes mellitus with diabetic peripheral angiopathy without gangrene: Secondary | ICD-10-CM | POA: Diagnosis not present

## 2016-08-28 DIAGNOSIS — I10 Essential (primary) hypertension: Secondary | ICD-10-CM | POA: Diagnosis not present

## 2016-08-28 DIAGNOSIS — E669 Obesity, unspecified: Secondary | ICD-10-CM | POA: Diagnosis not present

## 2016-08-28 DIAGNOSIS — I2510 Atherosclerotic heart disease of native coronary artery without angina pectoris: Secondary | ICD-10-CM | POA: Diagnosis not present

## 2016-08-28 LAB — LIPID PANEL
CHOL/HDL RATIO: 4.7 ratio (ref <5.0)
CHOLESTEROL: 150 mg/dL (ref <200)
HDL: 32 mg/dL — ABNORMAL LOW (ref >40)
LDL Cholesterol: 74 mg/dL (ref <100)
Triglycerides: 218 mg/dL — ABNORMAL HIGH (ref <150)
VLDL: 44 mg/dL — AB (ref <30)

## 2016-08-28 LAB — COMPREHENSIVE METABOLIC PANEL
ALT: 20 U/L (ref 9–46)
AST: 20 U/L (ref 10–35)
Albumin: 4.2 g/dL (ref 3.6–5.1)
Alkaline Phosphatase: 61 U/L (ref 40–115)
BUN: 22 mg/dL (ref 7–25)
CHLORIDE: 106 mmol/L (ref 98–110)
CO2: 28 mmol/L (ref 20–31)
Calcium: 9.6 mg/dL (ref 8.6–10.3)
Creat: 1.08 mg/dL (ref 0.70–1.25)
Glucose, Bld: 130 mg/dL — ABNORMAL HIGH (ref 65–99)
POTASSIUM: 4.8 mmol/L (ref 3.5–5.3)
Sodium: 140 mmol/L (ref 135–146)
TOTAL PROTEIN: 6.6 g/dL (ref 6.1–8.1)
Total Bilirubin: 0.4 mg/dL (ref 0.2–1.2)

## 2016-08-28 NOTE — Progress Notes (Signed)
Daily Session Note  Patient Details  Name: Jonathan Shepard MRN: [id number] Date of Birth: [birth year] Referring Provider:     CARDIAC REHAB PHASE II ORIENTATION from 06/02/2016 in [place]  Referring Provider  [name]      Encounter Date: 08/28/2016  Check In:     Session Check In - 08/28/16 0930      Check-In   Location [facility]   Staff Present Suzanne Boron, BS, EP, Exercise Physiologist;Lota Leamer Wynetta Emery, RN, BSN   Supervising physician immediately available to respond to emergencies See telemetry face sheet for immediately available MD   Medication changes reported     No   Fall or balance concerns reported    No   Warm-up and Cool-down Performed as group-led instruction   Resistance Training Performed Yes   VAD Patient? No     Pain Assessment   Currently in Pain? No/denies   Pain Score 0-No pain   Multiple Pain Sites No      Capillary Blood Glucose: No results found for this or any previous visit (from the past 24 hour(s)).    History  Smoking Status  . Never Smoker  Smokeless Tobacco  . Never Used    Goals Met:  Independence with exercise equipment Exercise tolerated well No report of cardiac concerns or symptoms Strength training completed today  Goals Unmet:  Not Applicable  Comments: Check out 1030.   [name] is Medical Director for [facility] Cardiac and Pulmonary Rehab.

## 2016-08-29 LAB — HEMOGLOBIN A1C
Hgb A1c MFr Bld: 6 % — ABNORMAL HIGH (ref <5.7)
MEAN PLASMA GLUCOSE: 126 mg/dL

## 2016-08-31 ENCOUNTER — Encounter (HOSPITAL_COMMUNITY)
Admission: RE | Admit: 2016-08-31 | Discharge: 2016-08-31 | Disposition: A | Discharge status: Home / Self Care | Payer: Medicare HMO | Source: Ambulatory Visit | Attending: Cardiovascular Disease | Admitting: Cardiovascular Disease

## 2016-08-31 DIAGNOSIS — E785 Hyperlipidemia, unspecified: Secondary | ICD-10-CM | POA: Diagnosis not present

## 2016-08-31 DIAGNOSIS — Z7902 Long term (current) use of antithrombotics/antiplatelets: Secondary | ICD-10-CM | POA: Diagnosis not present

## 2016-08-31 DIAGNOSIS — I10 Essential (primary) hypertension: Secondary | ICD-10-CM | POA: Diagnosis not present

## 2016-08-31 DIAGNOSIS — Z951 Presence of aortocoronary bypass graft: Secondary | ICD-10-CM | POA: Diagnosis not present

## 2016-08-31 DIAGNOSIS — E669 Obesity, unspecified: Secondary | ICD-10-CM | POA: Diagnosis not present

## 2016-08-31 DIAGNOSIS — Z7982 Long term (current) use of aspirin: Secondary | ICD-10-CM | POA: Diagnosis not present

## 2016-08-31 DIAGNOSIS — I2510 Atherosclerotic heart disease of native coronary artery without angina pectoris: Secondary | ICD-10-CM | POA: Diagnosis not present

## 2016-08-31 DIAGNOSIS — E119 Type 2 diabetes mellitus without complications: Secondary | ICD-10-CM | POA: Diagnosis not present

## 2016-08-31 DIAGNOSIS — Z79899 Other long term (current) drug therapy: Secondary | ICD-10-CM | POA: Diagnosis not present

## 2016-08-31 NOTE — Progress Notes (Signed)
Daily Session Note  Patient Details  Name: Jonathan Shepard MRN: [id number] Date of Birth: 1951-03-26 Referring Provider:     [place] from 06/02/2016 in [place]  Referring Provider  [name]      Encounter Date: 08/31/2016  Check In:     Session Check In - 08/31/16 0930      Check-In   Location [facility]   Staff Present Suzanne Boron, BS, EP, Exercise Physiologist;Carsen Machi Wynetta Emery, RN, BSN   Supervising physician immediately available to respond to emergencies See telemetry face sheet for immediately available MD   Medication changes reported     No   Fall or balance concerns reported    No   Warm-up and Cool-down Performed as group-led instruction   Resistance Training Performed Yes   VAD Patient? No     Pain Assessment   Currently in Pain? No/denies   Pain Score 0-No pain   Multiple Pain Sites No      Capillary Blood Glucose: No results found for this or any previous visit (from the past 24 hour(s)).    History  Smoking Status  . Never Smoker  Smokeless Tobacco  . Never Used    Goals Met:  Independence with exercise equipment Exercise tolerated well No report of cardiac concerns or symptoms Strength training completed today  Goals Unmet:  Not Applicable  Comments: Check out 1030.   [name] is Medical Director for [facility] Cardiac and Pulmonary Rehab.

## 2016-09-02 ENCOUNTER — Encounter (HOSPITAL_COMMUNITY)
Admission: RE | Admit: 2016-09-02 | Discharge: 2016-09-02 | Disposition: A | Discharge status: Home / Self Care | Payer: Medicare HMO | Source: Ambulatory Visit | Attending: Cardiovascular Disease | Admitting: Cardiovascular Disease

## 2016-09-02 VITALS — Ht 72.0 in | Wt 210.3 lb

## 2016-09-02 DIAGNOSIS — Z7902 Long term (current) use of antithrombotics/antiplatelets: Secondary | ICD-10-CM | POA: Diagnosis not present

## 2016-09-02 DIAGNOSIS — E119 Type 2 diabetes mellitus without complications: Secondary | ICD-10-CM | POA: Diagnosis not present

## 2016-09-02 DIAGNOSIS — Z79899 Other long term (current) drug therapy: Secondary | ICD-10-CM | POA: Diagnosis not present

## 2016-09-02 DIAGNOSIS — I2510 Atherosclerotic heart disease of native coronary artery without angina pectoris: Secondary | ICD-10-CM | POA: Diagnosis not present

## 2016-09-02 DIAGNOSIS — Z7982 Long term (current) use of aspirin: Secondary | ICD-10-CM | POA: Diagnosis not present

## 2016-09-02 DIAGNOSIS — E785 Hyperlipidemia, unspecified: Secondary | ICD-10-CM | POA: Diagnosis not present

## 2016-09-02 DIAGNOSIS — E669 Obesity, unspecified: Secondary | ICD-10-CM | POA: Diagnosis not present

## 2016-09-02 DIAGNOSIS — Z951 Presence of aortocoronary bypass graft: Secondary | ICD-10-CM

## 2016-09-02 DIAGNOSIS — I10 Essential (primary) hypertension: Secondary | ICD-10-CM | POA: Diagnosis not present

## 2016-09-02 NOTE — Progress Notes (Signed)
Daily Session Note  Patient Details  Name: Jonathan Shepard MRN: [id number] Date of Birth: 05-21-1951 Referring Provider:     [place] from 06/02/2016 in [place]  Referring Provider  [name]      Encounter Date: 09/02/2016  Check In:     Session Check In - 09/02/16 0930      Check-In   Location [facility]   Staff Present Suzanne Boron, BS, EP, Exercise Physiologist;Hosea [place] Wynetta Emery, RN, BSN   Supervising physician immediately available to respond to emergencies See telemetry face sheet for immediately available MD   Medication changes reported     No   Fall or balance concerns reported    No   Warm-up and Cool-down Performed as group-led instruction   Resistance Training Performed Yes   VAD Patient? No     Pain Assessment   Currently in Pain? No/denies   Pain Score 0-No pain   Multiple Pain Sites No      Capillary Blood Glucose: No results found for this or any previous visit (from the past 24 hour(s)).    History  Smoking Status  . Never Smoker  Smokeless Tobacco  . Never Used    Goals Met:  Independence with exercise equipment Exercise tolerated well No report of cardiac concerns or symptoms Strength training completed today  Goals Unmet:  Not Applicable  Comments: Check out 1030.   [name] is Medical Director for [facility] Cardiac and Pulmonary Rehab.

## 2016-09-03 NOTE — Progress Notes (Signed)
Cardiac Individual Treatment Plan  Patient Details  Name: Jonathan Shepard MRN: [id number] Date of Birth: [birth year] Referring Provider:     CARDIAC REHAB PHASE II ORIENTATION from 06/02/2016 in [place]  Referring Provider  [name]      Initial Encounter Date:    CARDIAC REHAB PHASE II ORIENTATION from 06/02/2016 in [place]  Date  06/02/16  Referring Provider  [name]      Visit Diagnosis: S/P CABG x 4  Patient's Home Medications on Admission:  Current Outpatient Prescriptions:  .  aspirin EC 81 MG tablet, Take 81 mg by mouth at bedtime. , Disp: , Rfl:  .  Choline Fenofibrate (FENOFIBRIC ACID) 45 MG CPDR, TAKE 1 CAPSULE ONE TIME DAILY (Patient taking differently: take 5ms at bedtime), Disp: 90 capsule, Rfl: 2 .  clopidogrel (PLAVIX) 75 MG tablet, TAKE 1 TABLET DAILY (NEEDS APPOINTMENT FOR REFILLS.), Disp: 90 tablet, Rfl: 2 .  Coenzyme Q10 (CO Q10 PO), Take 30 mg by mouth daily. , Disp: , Rfl:  .  diclofenac (VOLTAREN) 75 MG EC tablet, Take 75 mg by mouth 2 (two) times daily as needed for moderate pain., Disp: , Rfl:  .  glimepiride (AMARYL) 4 MG tablet, Take 4 mg by mouth 2 (two) times daily. , Disp: , Rfl:  .  HYDROcodone-acetaminophen (NORCO) 10-325 MG tablet, Take 1 tablet by mouth every 6 (six) hours as needed for moderate pain or severe pain., Disp: , Rfl:  .  isosorbide mononitrate (IMDUR) 30 MG 24 hr tablet, Take 1 tablet (30 mg total) by mouth daily. For one month then stop., Disp: 30 tablet, Rfl: 0 .  metFORMIN (GLUCOPHAGE) 1000 MG tablet, Take 1,000 mg by mouth 2 (two) times daily with a meal., Disp: , Rfl:  .  metoprolol tartrate (LOPRESSOR) 25 MG tablet, Take 1 tablet (25 mg total) by mouth 2 (two) times daily., Disp: 60 tablet, Rfl: 1 .  Multiple Vitamin (MULTIVITAMIN) tablet, Take 1 tablet by mouth daily., Disp: , Rfl:  .  Omega-3 Fatty Acids (FISH OIL) 1200 MG CAPS, Take 1 capsule by mouth 2 (two) times  daily. , Disp: , Rfl:  .  oxyCODONE (OXY IR/ROXICODONE) 5 MG immediate release tablet, Take 5 mg by mouth every 4-6 hours PRN severe pain., Disp: 20 tablet, Rfl: 0 .  pravastatin (PRAVACHOL) 80 MG tablet, TAKE 1 TABLET EVERY DAY (Patient taking differently: take 852m at bedtime), Disp: 90 tablet, Rfl: 2 .  ramipril (ALTACE) 2.5 MG capsule, Take 1 capsule (2.5 mg total) by mouth daily., Disp: , Rfl:   Past Medical History: Past Medical History:  Diagnosis Date  . CAD (coronary artery disease)   . Diabetes (HCRegino Ramirez  . Dyspnea   . Hyperlipidemia   . Hypertension   . Obesity     Tobacco Use: History  Smoking Status  . Never Smoker  Smokeless Tobacco  . Never Used    Labs: Recent Review Flowsheet Data    Labs for ITP Cardiac and Pulmonary Rehab Latest Ref Rng & Units 04/23/2016 04/23/2016 04/23/2016 04/23/2016 08/28/2016   Cholestrol <200 mg/dL - - - - 150   LDLCALC <100 mg/dL - - - - 74   HDL >40 mg/dL - - - - 32(L)   Trlycerides <150 mg/dL - - - - 218(H)   Hemoglobin A1c <5.7 % - - - - 6.0(H)   PHART 7.350 - 7.450 7.330(L) - 7.358 - -   PCO2ART 32.0 - 48.0 mmHg 42.0 -  38.9 - -   HCO3 20.0 - 28.0 mmol/L 22.1 - 21.9 - -   TCO2 0 - 100 mmol/L 23 23 23 22  -   ACIDBASEDEF 0.0 - 2.0 mmol/L 4.0(H) - 3.0(H) - -   O2SAT % 97.0 - 90.0 - -      Capillary Blood Glucose: Lab Results  Component Value Date   GLUCAP 135 (H) 04/27/2016   GLUCAP 136 (H) 04/27/2016   GLUCAP 129 (H) 04/26/2016   GLUCAP 102 (H) 04/26/2016   GLUCAP 150 (H) 04/26/2016     Exercise Target Goals:    Exercise Program Goal: Individual exercise prescription set with THRR, safety & activity barriers. Participant demonstrates ability to understand and report RPE using BORG scale, to self-measure pulse accurately, and to acknowledge the importance of the exercise prescription.  Exercise Prescription Goal: Starting with aerobic activity 30 plus minutes a day, 3 days per week for initial exercise prescription.  Provide home exercise prescription and guidelines that participant acknowledges understanding prior to discharge.  Activity Barriers & Risk Stratification:     Activity Barriers & Cardiac Risk Stratification - 06/02/16 1105      Activity Barriers & Cardiac Risk Stratification   Activity Barriers Arthritis   Cardiac Risk Stratification High      6 Minute Walk:     6 Minute Walk    Row Name 06/02/16 0959 09/03/16 1446       6 Minute Walk   Phase Initial Discharge    Distance 1400 feet 1700 feet    Distance % Change 0 % 21.43 %    Walk Time 6 minutes 6 minutes    # of Rest Breaks 0 0    MPH 2.65 3.21    METS 3.03 3.46    RPE 9 11    Perceived Dyspnea  11 11    VO2 Peak 12.71 15.38    Symptoms No No    Resting HR 99 bpm 85 bpm    Resting BP 126/76 146/58    Max Ex. HR 111 bpm 103 bpm    Max Ex. BP 138/80 180/86    2 Minute Post BP 128/78 150/80       Oxygen Initial Assessment:   Oxygen Re-Evaluation:   Oxygen Discharge (Final Oxygen Re-Evaluation):   Initial Exercise Prescription:     Initial Exercise Prescription - 06/02/16 1000      Date of Initial Exercise RX and Referring Provider   Date 06/02/16   Referring Provider [name]     Treadmill   MPH 1.3   Grade 0   Minutes 15   METs 1.9     NuStep   Level 2   SPM 20   Minutes 20   METs 2     Prescription Details   Frequency (times per week) 3   Duration Progress to 30 minutes of continuous aerobic without signs/symptoms of physical distress     Intensity   THRR REST +  30   THRR 40-80% of Max Heartrate [id number]   Ratings of Perceived Exertion 11-13   Perceived Dyspnea 0-4     Progression   Progression Continue progressive overload as per policy without signs/symptoms or physical distress.     Resistance Training   Training Prescription Yes   Weight 1   Reps 10-12      Perform Capillary Blood Glucose checks as needed.  Exercise Prescription Changes:      Exercise  Prescription Changes    Row Name 06/15/16  1400 07/13/16 1400 08/10/16 1300 08/25/16 0800       Response to Exercise   Blood Pressure (Admit) 150/78 126/70 132/66 140/70    Blood Pressure (Exercise) 146/70 130/80 140/80 170/82    Blood Pressure (Exit) 118/62 112/62 110/62 132/66    Heart Rate (Admit) 77 bpm 87 bpm 86 bpm 62 bpm    Heart Rate (Exercise) 100 bpm 109 bpm 110 bpm 108 bpm    Heart Rate (Exit) 86 bpm 94 bpm 95 bpm 91 bpm    Rating of Perceived Exertion (Exercise) 7 8 11 11     Duration Progress to 30 minutes of continuous aerobic without signs/symptoms of physical distress Progress to 30 minutes of continuous aerobic without signs/symptoms of physical distress Progress to 30 minutes of  aerobic without signs/symptoms of physical distress Progress to 30 minutes of  aerobic without signs/symptoms of physical distress    Intensity Rest + 30 Rest + 30 THRR unchanged THRR unchanged      Progression   Progression Continue progressive overload as per policy without signs/symptoms or physical distress. Continue progressive overload as per policy without signs/symptoms or physical distress. Continue to progress workloads to maintain intensity without signs/symptoms of physical distress. Continue to progress workloads to maintain intensity without signs/symptoms of physical distress.      Resistance Training   Training Prescription Yes Yes Yes Yes    Weight 2 3 5 5     Reps 10-12 10-12 10-15 10-15      Treadmill   MPH 2 2.7 3.6 3.6    Grade 0 0 1 1.5    Minutes 15 15 15 15     METs 2.5 3 4.4 4.4      NuStep   Level 2 3 3 4     SPM 11 37 26 65    Minutes 20 20 20 20     METs 3.65 3.65 3.66 3.66      Home Exercise Plan   Plans to continue exercise at [facility] (comment) Home (comment)    Frequency Add 2 additional days to program exercise sessions. Add 2 additional days to program exercise sessions. Add 2 additional days to program exercise sessions. Add 2 additional days to  program exercise sessions.      Exercise Review   Progression Yes Yes Yes Yes       Exercise Comments:      Exercise Comments    Row Name 06/15/16 1448 07/13/16 1435 08/10/16 1327 08/25/16 0819     Exercise Comments Patient is progressing well. Patient is proggressing well Patient has progressed very well on the treadmill and nustep.  Patient is doing well in the program.        Exercise Goals and Review:   Exercise Goals Re-Evaluation :     Exercise Goals Re-Evaluation    Row Name 08/11/16 0807 09/03/16 1517           Exercise Goal Re-Evaluation   Exercise Goals Review Increase Strenth and Stamina;Increase Physical Activity Increase Physical Activity;Increase Strenth and Stamina      Comments Patient has gained strength and stamina. He is walking more now.  Upon graduation, patient's strength and stamina increased. His grip strength improved at discharge and also his flexibility. His post 6 minute walk test also improved. He is walking more now and is more active overall.       Expected Outcomes Patient will continue to progress in the program.  Patient will continue to exercise continuing to meet his personal  goals.           Discharge Exercise Prescription (Final Exercise Prescription Changes):     Exercise Prescription Changes - 08/25/16 0800      Response to Exercise   Blood Pressure (Admit) 140/70   Blood Pressure (Exercise) 170/82   Blood Pressure (Exit) 132/66   Heart Rate (Admit) 62 bpm   Heart Rate (Exercise) 108 bpm   Heart Rate (Exit) 91 bpm   Rating of Perceived Exertion (Exercise) 11   Duration Progress to 30 minutes of  aerobic without signs/symptoms of physical distress   Intensity THRR unchanged     Progression   Progression Continue to progress workloads to maintain intensity without signs/symptoms of physical distress.     Resistance Training   Training Prescription Yes   Weight 5   Reps 10-15     Treadmill   MPH 3.6   Grade 1.5    Minutes 15   METs 4.4     NuStep   Level 4   SPM 65   Minutes 20   METs 3.66     Home Exercise Plan   Plans to continue exercise at Home (comment)   Frequency Add 2 additional days to program exercise sessions.     Exercise Review   Progression Yes      Nutrition:  Target Goals: Understanding of nutrition guidelines, daily intake of sodium <1561m, cholesterol <2036m calories 30% from fat and 7% or less from saturated fats, daily to have 5 or more servings of fruits and vegetables.  Biometrics:     Pre Biometrics - 06/02/16 1003      Pre Biometrics   Height 6' (1.829 m)   Weight 210 lb 5.1 oz (95.4 kg)   Waist Circumference 39 inches   Hip Circumference 38.5 inches   Waist to Hip Ratio 1.01 %   BMI (Calculated) 28.6   Triceps Skinfold 14 mm   % Body Fat 26.5 %   Grip Strength 43.67 kg   Flexibility 11.5 in   Single Leg Stand 6 seconds         Post Biometrics - 09/03/16 1446       Post  Biometrics   Height 6' (1.829 m)   Weight 210 lb 5.1 oz (95.4 kg)   Waist Circumference 41 inches   Hip Circumference 38 inches   Waist to Hip Ratio 1.08 %   BMI (Calculated) 28.6   Triceps Skinfold 10 mm   % Body Fat 26.1 %   Grip Strength 60 kg   Flexibility 11.67 in   Single Leg Stand 22 seconds      Nutrition Therapy Plan and Nutrition Goals:   Nutrition Discharge: Rate Your Plate Scores:     Nutrition Assessments - 09/03/16 1517      MEDFICTS Scores   Pre Score 36   Post Score 62   Score Difference 26      Nutrition Goals Re-Evaluation:   Nutrition Goals Discharge (Final Nutrition Goals Re-Evaluation):   Psychosocial: Target Goals: Acknowledge presence or absence of significant depression and/or stress, maximize coping skills, provide positive support system. Participant is able to verbalize types and ability to use techniques and skills needed for reducing stress and depression.  Initial Review & Psychosocial Screening:     Initial Psych  Review & Screening - 06/02/16 1113      Initial Review   Current issues with --  No concerns at this time     [facility] Yes     Barriers   Psychosocial barriers to participate in program There are no identifiable barriers or psychosocial needs.     Screening Interventions   Interventions Encouraged to exercise      Quality of Life Scores:     Quality of Life - 09/03/16 1447      Quality of Life Scores   Health/Function Pre 28.2 %   Health/Function Post 30 %   Health/Function % Change 6.38 %   Socioeconomic Pre 30 %   Socioeconomic Post 30 %   Socioeconomic % Change  0 %   Psych/Spiritual Pre 30 %   Psych/Spiritual Post 30 %   Psych/Spiritual % Change 0 %   Family Pre 30 %   Family Post 30 %   Family % Change 0 %   GLOBAL Pre 29.23 %   GLOBAL Post 30 %   GLOBAL % Change 2.63 %      PHQ-9: Recent Review Flowsheet Data    Depression screen [facility] 2/9 09/03/2016 06/02/2016   Decreased Interest 0 0   Down, Depressed, Hopeless 0 0   PHQ - 2 Score 0 0   Altered sleeping 0 0   Tired, decreased energy 0 0   Change in appetite 0 0   Feeling bad or failure about yourself  0 0   Trouble concentrating 0 0   Moving slowly or fidgety/restless 0 0   Suicidal thoughts 0 0   PHQ-9 Score 0 0     Interpretation of Total Score  Total Score Depression Severity:  1-4 = Minimal depression, 5-9 = Mild depression, 10-14 = Moderate depression, 15-19 = Moderately severe depression, 20-27 = Severe depression   Psychosocial Evaluation and Intervention:     Psychosocial Evaluation - 09/03/16 1522      Discharge Psychosocial Assessment & Intervention   Comments No psychosocial issues identified at discharge. His PHQ-9 score remained the same at 0. His QOL score remained the same at 29.23.       Psychosocial Re-Evaluation:     Psychosocial Re-Evaluation    [place] Name 06/18/16 1044 07/15/16 1517 08/11/16 0809         Psychosocial Re-Evaluation    Current issues with  -  - None Identified     Comments Patient's QOL WNL at 29.23. Continues to have no psychosocial issues identified.  Patient continues to have no psychosocial issues identified.   -     Interventions Encouraged to attend Cardiac Rehabilitation for the exercise Encouraged to attend Cardiac Rehabilitation for the exercise  -     Continue Psychosocial Services  No No No Follow up required        Psychosocial Discharge (Final Psychosocial Re-Evaluation):     Psychosocial Re-Evaluation - 08/11/16 0809      Psychosocial Re-Evaluation   Current issues with None Identified   Continue Psychosocial Services  No Follow up required      Vocational Rehabilitation: Provide vocational rehab assistance to qualifying candidates.   Vocational Rehab Evaluation & Intervention:     Vocational Rehab - 06/02/16 1106      Initial Vocational Rehab Evaluation & Intervention   Assessment shows need for Vocational Rehabilitation No      Education: Education Goals: Education classes will be provided on a weekly basis, covering required topics. Participant will state understanding/return demonstration of topics presented.  Learning Barriers/Preferences:     Learning Barriers/Preferences - 06/02/16 1106      Learning Barriers/Preferences   Learning  Barriers None   Learning Preferences Written Material;Video      Education Topics: Hypertension, Hypertension Reduction -Define heart disease and high blood pressure. Discus how high blood pressure affects the body and ways to reduce high blood pressure.   Exercise and Your Heart -Discuss why it is important to exercise, the FITT principles of exercise, normal and abnormal responses to exercise, and how to exercise safely.   CARDIAC REHAB PHASE II EXERCISE from 09/02/2016 in [place]  Date  07/15/16  Educator  DC  Instruction Review Code  2- meets goals/outcomes      Angina -Discuss definition of  angina, causes of angina, treatment of angina, and how to decrease risk of having angina.   CARDIAC REHAB PHASE II EXERCISE from 09/02/2016 in [place]  Date  07/22/16  Educator  [place]  Instruction Review Code  2- meets goals/outcomes      Cardiac Medications -Review what the following cardiac medications are used for, how they affect the body, and side effects that may occur when taking the medications.  Medications include Aspirin, Beta blockers, calcium channel blockers, ACE Inhibitors, angiotensin receptor blockers, diuretics, digoxin, and antihyperlipidemics.   CARDIAC REHAB PHASE II EXERCISE from 09/02/2016 in [place]  Date  07/29/16  Educator  DC  Instruction Review Code  2- meets goals/outcomes      Congestive Heart Failure -Discuss the definition of CHF, how to live with CHF, the signs and symptoms of CHF, and how keep track of weight and sodium intake.   CARDIAC REHAB PHASE II EXERCISE from 09/02/2016 in [place]  Date  08/05/16  Educator  DC  Instruction Review Code  2- meets goals/outcomes      Heart Disease and Intimacy -Discus the effect sexual activity has on the heart, how changes occur during intimacy as we age, and safety during sexual activity.   CARDIAC REHAB PHASE II EXERCISE from 09/02/2016 in [place]  Date  08/12/16  Educator  DC  Instruction Review Code  2- meets goals/outcomes      Smoking Cessation / COPD -Discuss different methods to quit smoking, the health benefits of quitting smoking, and the definition of COPD.   CARDIAC REHAB PHASE II EXERCISE from 09/02/2016 in [place]  Date  08/19/16  Educator  DC  Instruction Review Code  2- meets goals/outcomes      Nutrition I: Fats -Discuss the types of cholesterol, what cholesterol does to the heart, and how cholesterol levels can be controlled.   CARDIAC REHAB PHASE II EXERCISE  from 09/02/2016 in [place]  Date  08/26/16  Educator  DC  Instruction Review Code  2- meets goals/outcomes      Nutrition II: Labels -Discuss the different components of food labels and how to read food label   CARDIAC REHAB PHASE II EXERCISE from 09/02/2016 in [place]  Date  09/02/16  Educator  DC  Instruction Review Code  2- meets goals/outcomes      Heart Parts and Heart Disease -Discuss the anatomy of the heart, the pathway of blood circulation through the heart, and these are affected by heart disease.   CARDIAC REHAB PHASE II EXERCISE from 09/02/2016 in [place]  Date  06/10/16  Educator  Russella Dar  Instruction Review Code  2- meets goals/outcomes      Stress I: Signs and Symptoms -Discuss the causes of stress, how stress  may lead to anxiety and depression, and ways to limit stress.   CARDIAC REHAB PHASE II EXERCISE from 09/02/2016 in [place]  Date  06/17/16  Educator  [place]  Instruction Review Code  2- meets goals/outcomes      Stress II: Relaxation -Discuss different types of relaxation techniques to limit stress.   CARDIAC REHAB PHASE II EXERCISE from 09/02/2016 in [place]  Date  06/24/16  Educator  [place]  Instruction Review Code  2- meets goals/outcomes      Warning Signs of Stroke / TIA -Discuss definition of a stroke, what the signs and symptoms are of a stroke, and how to identify when someone is having stroke.   CARDIAC REHAB PHASE II EXERCISE from 09/02/2016 in [place]  Date  07/01/16  Educator  DJ  Instruction Review Code  2- meets goals/outcomes      Knowledge Questionnaire Score:     Knowledge Questionnaire Score - 09/03/16 1516      Knowledge Questionnaire Score   Pre Score 22/24   Post Score 22/24      Core Components/Risk Factors/Patient Goals at Admission:     Personal Goals and Risk  Factors at Admission - 06/02/16 1110      Core Components/Risk Factors/Patient Goals on Admission    Weight Management Weight Maintenance   Increase Strength and Stamina Yes   Intervention Provide advice, education, support and counseling about physical activity/exercise needs.;Develop an individualized exercise prescription for aerobic and resistive training based on initial evaluation findings, risk stratification, comorbidities and participant's personal goals.   Expected Outcomes Achievement of increased cardiorespiratory fitness and enhanced flexibility, muscular endurance and strength shown through measurements of functional capacity and personal statement of participant.   Personal Goal Other Yes   Personal Goal Get physically active again, gain some muscle back   Intervention Attend Cardiac Rehab 3xweek and to supplement exercise 2xweek at home.    Expected Outcomes Achieve personal goals.       Core Components/Risk Factors/Patient Goals Review:      Goals and Risk Factor Review    Row Name 06/02/16 1113 06/18/16 1042 07/15/16 1515 08/11/16 0805 09/03/16 1519     Core Components/Risk Factors/Patient Goals Review   Personal Goals Review Weight Management/Obesity;Increase Strength and Stamina Weight Management/Obesity;Increase Strength and Stamina  Gain some muscle back; get physically active again. Weight Management/Obesity;Increase Strength and Stamina  Gain muscle back; get physically active again. Weight Management/Obesity;Diabetes  Get physically active again; gain some muscle back.  Weight Management/Obesity  Get physically active again; gain some muscle back.    Review  - Patient has attended 5 sessions with some progression. Will continue to monitor for progress. Patient has attended 16 sessions. He has gained 5.6 lbs. He is progressing well with increased strength and stamina. He says he feels stronger.  Patient has completed 27 sessions gaining 7.6 lbs. He says he feels  better and stonger since starting the program. He has progressed well in the program.  Upon graduation, patient gained 6.6 lbs. His Medficts score was worse at graduation. Patient did progress well in the program. He did get stronger and says he feels better overall. He plans to continue exercising at [facility].    Expected Outcomes  - Patient will continue to attend sessions and complete the program meeting her personal goals.  Patient will complete the program meeting his personal goals.  Patient will complete the program meeting his personal goals.  Patient will continue  to exercise with continued increased strength and stamin. He will work toward eating a heart healthy diet and begin to lose weight.       Core Components/Risk Factors/Patient Goals at Discharge (Final Review):      Goals and Risk Factor Review - 09/03/16 1519      Core Components/Risk Factors/Patient Goals Review   Personal Goals Review Weight Management/Obesity  Get physically active again; gain some muscle back.    Review Upon graduation, patient gained 6.6 lbs. His Medficts score was worse at graduation. Patient did progress well in the program. He did get stronger and says he feels better overall. He plans to continue exercising at [facility].    Expected Outcomes Patient will continue to exercise with continued increased strength and stamin. He will work toward eating a heart healthy diet and begin to lose weight.       ITP Comments:   Comments: Patient graduated from [facility] today on 09/02/16 after completing 36 sessions. He achieved LTG of 30 minutes of aerobic exercise at Max Met level of 4.8. All patients vitals are WNL. Patient has met with dietician. Discharge instruction has been reviewed in detail and patient stated an understanding of material given. Patient plans to continue exercising at home. Cardiac Rehab staff will make f/u calls at 1 month, 6 months, and 1 year. Patient had no complaints of any  abnormal S/S or pain on their exit visit.

## 2016-09-03 NOTE — Progress Notes (Signed)
Discharge Summary  Patient Details  Name: Jonathan Shepard MRN: [id number] Date of Birth: 28-Mar-1951 Referring Provider:     [place] from 06/02/2016 in [place]  Referring Provider  [name]       Number of Visits: 36  Reason for Discharge:  Patient reached a stable level of exercise. Patient independent in their exercise.  Smoking History:  History  Smoking Status  . Never Smoker  Smokeless Tobacco  . Never Used    Diagnosis:  S/P CABG x 4  ADL UCSD:   Initial Exercise Prescription:     Initial Exercise Prescription - 06/02/16 1000      Date of Initial Exercise RX and Referring Provider   Date 06/02/16   Referring Provider [name]     Treadmill   MPH 1.3   Grade 0   Minutes 15   METs 1.9     NuStep   Level 2   SPM 20   Minutes 20   METs 2     Prescription Details   Frequency (times per week) 3   Duration Progress to 30 minutes of continuous aerobic without signs/symptoms of physical distress     Intensity   THRR REST +  30   THRR 40-80% of Max Heartrate [id number]   Ratings of Perceived Exertion 11-13   Perceived Dyspnea 0-4     Progression   Progression Continue progressive overload as per policy without signs/symptoms or physical distress.     Resistance Training   Training Prescription Yes   Weight 1   Reps 10-12      Discharge Exercise Prescription (Final Exercise Prescription Changes):     Exercise Prescription Changes - 08/25/16 0800      Response to Exercise   Blood Pressure (Admit) 140/70   Blood Pressure (Exercise) 170/82   Blood Pressure (Exit) 132/66   Heart Rate (Admit) 62 bpm   Heart Rate (Exercise) 108 bpm   Heart Rate (Exit) 91 bpm   Rating of Perceived Exertion (Exercise) 11   Duration Progress to 30 minutes of  aerobic without signs/symptoms of physical distress   Intensity THRR unchanged     Progression   Progression Continue to progress workloads  to maintain intensity without signs/symptoms of physical distress.     Resistance Training   Training Prescription Yes   Weight 5   Reps 10-15     Treadmill   MPH 3.6   Grade 1.5   Minutes 15   METs 4.4     NuStep   Level 4   SPM 65   Minutes 20   METs 3.66     Home Exercise Plan   Plans to continue exercise at Home (comment)   Frequency Add 2 additional days to program exercise sessions.     Exercise Review   Progression Yes      Functional Capacity:     6 Minute Walk    Row Name 06/02/16 0959 09/03/16 1446       6 Minute Walk   Phase Initial Discharge    Distance 1400 feet 1700 feet    Distance % Change 0 % 21.43 %    Walk Time 6 minutes 6 minutes    # of Rest Breaks 0 0    MPH 2.65 3.21    METS 3.03 3.46    RPE 9 11    Perceived Dyspnea  11 11    VO2 Peak 12.71 15.38  Symptoms No No    Resting HR 99 bpm 85 bpm    Resting BP 126/76 146/58    Max Ex. HR 111 bpm 103 bpm    Max Ex. BP 138/80 180/86    2 Minute Post BP 128/78 150/80       Psychological, QOL, Others - Outcomes: PHQ 2/9: Depression screen [facility] 2/9 09/03/2016 06/02/2016  Decreased Interest 0 0  Down, Depressed, Hopeless 0 0  PHQ - 2 Score 0 0  Altered sleeping 0 0  Tired, decreased energy 0 0  Change in appetite 0 0  Feeling bad or failure about yourself  0 0  Trouble concentrating 0 0  Moving slowly or fidgety/restless 0 0  Suicidal thoughts 0 0  PHQ-9 Score 0 0    Quality of Life:     Quality of Life - 09/03/16 1447      Quality of Life Scores   Health/Function Pre 28.2 %   Health/Function Post 30 %   Health/Function % Change 6.38 %   Socioeconomic Pre 30 %   Socioeconomic Post 30 %   Socioeconomic % Change  0 %   Psych/Spiritual Pre 30 %   Psych/Spiritual Post 30 %   Psych/Spiritual % Change 0 %   Family Pre 30 %   Family Post 30 %   Family % Change 0 %   GLOBAL Pre 29.23 %   GLOBAL Post 30 %   GLOBAL % Change 2.63 %      Personal Goals: Goals established  at orientation with interventions provided to work toward goal.     Personal Goals and Risk Factors at Admission - 06/02/16 1110      Core Components/Risk Factors/Patient Goals on Admission    Weight Management Weight Maintenance   Increase Strength and Stamina Yes   Intervention Provide advice, education, support and counseling about physical activity/exercise needs.;Develop an individualized exercise prescription for aerobic and resistive training based on initial evaluation findings, risk stratification, comorbidities and participant's personal goals.   Expected Outcomes Achievement of increased cardiorespiratory fitness and enhanced flexibility, muscular endurance and strength shown through measurements of functional capacity and personal statement of participant.   Personal Goal Other Yes   Personal Goal Get physically active again, gain some muscle back   Intervention Attend Cardiac Rehab 3xweek and to supplement exercise 2xweek at home.    Expected Outcomes Achieve personal goals.        Personal Goals Discharge:     Goals and Risk Factor Review    Row Name 06/02/16 1113 06/18/16 1042 07/15/16 1515 08/11/16 0805 09/03/16 1519     Core Components/Risk Factors/Patient Goals Review   Personal Goals Review Weight Management/Obesity;Increase Strength and Stamina Weight Management/Obesity;Increase Strength and Stamina  Gain some muscle back; get physically active again. Weight Management/Obesity;Increase Strength and Stamina  Gain muscle back; get physically active again. Weight Management/Obesity;Diabetes  Get physically active again; gain some muscle back.  Weight Management/Obesity  Get physically active again; gain some muscle back.    Review  - Patient has attended 5 sessions with some progression. Will continue to monitor for progress. Patient has attended 16 sessions. He has gained 5.6 lbs. He is progressing well with increased strength and stamina. He says he feels stronger.   Patient has completed 27 sessions gaining 7.6 lbs. He says he feels better and stonger since starting the program. He has progressed well in the program.  Upon graduation, patient gained 6.6 lbs. His Medficts score was worse at  graduation. Patient did progress well in the program. He did get stronger and says he feels better overall. He plans to continue exercising at [facility].    Expected Outcomes  - Patient will continue to attend sessions and complete the program meeting her personal goals.  Patient will complete the program meeting his personal goals.  Patient will complete the program meeting his personal goals.  Patient will continue to exercise with continued increased strength and stamin. He will work toward eating a heart healthy diet and begin to lose weight.       Nutrition & Weight - Outcomes:     Pre Biometrics - 06/02/16 1003      Pre Biometrics   Height 6' (1.829 m)   Weight 210 lb 5.1 oz (95.4 kg)   Waist Circumference 39 inches   Hip Circumference 38.5 inches   Waist to Hip Ratio 1.01 %   BMI (Calculated) 28.6   Triceps Skinfold 14 mm   % Body Fat 26.5 %   Grip Strength 43.67 kg   Flexibility 11.5 in   Single Leg Stand 6 seconds         Post Biometrics - 09/03/16 1446       Post  Biometrics   Height 6' (1.829 m)   Weight 210 lb 5.1 oz (95.4 kg)   Waist Circumference 41 inches   Hip Circumference 38 inches   Waist to Hip Ratio 1.08 %   BMI (Calculated) 28.6   Triceps Skinfold 10 mm   % Body Fat 26.1 %   Grip Strength 60 kg   Flexibility 11.67 in   Single Leg Stand 22 seconds      Nutrition:   Nutrition Discharge:     Nutrition Assessments - 09/03/16 1517      MEDFICTS Scores   Pre Score 36   Post Score 62   Score Difference 26      Education Questionnaire Score:     Knowledge Questionnaire Score - 09/03/16 1516      Knowledge Questionnaire Score   Pre Score 22/24   Post Score 22/24      Goals reviewed with patient; copy given to  patient.

## 2016-09-04 ENCOUNTER — Encounter (HOSPITAL_COMMUNITY): Payer: Medicare HMO

## 2016-10-22 ENCOUNTER — Other Ambulatory Visit: Payer: Self-pay | Admitting: Cardiovascular Disease

## 2016-12-18 ENCOUNTER — Telehealth: Payer: Self-pay | Admitting: *Deleted

## 2017-01-06 DIAGNOSIS — E119 Type 2 diabetes mellitus without complications: Secondary | ICD-10-CM | POA: Diagnosis not present

## 2017-01-06 DIAGNOSIS — E669 Obesity, unspecified: Secondary | ICD-10-CM | POA: Diagnosis not present

## 2017-01-06 DIAGNOSIS — E6609 Other obesity due to excess calories: Secondary | ICD-10-CM | POA: Diagnosis not present

## 2017-01-06 DIAGNOSIS — Z1389 Encounter for screening for other disorder: Secondary | ICD-10-CM | POA: Diagnosis not present

## 2017-01-06 DIAGNOSIS — M1991 Primary osteoarthritis, unspecified site: Secondary | ICD-10-CM | POA: Diagnosis not present

## 2017-01-06 DIAGNOSIS — M2550 Pain in unspecified joint: Secondary | ICD-10-CM | POA: Diagnosis not present

## 2017-01-06 DIAGNOSIS — Z6831 Body mass index (BMI) 31.0-31.9, adult: Secondary | ICD-10-CM | POA: Diagnosis not present

## 2017-01-06 DIAGNOSIS — Z0000 Encounter for general adult medical examination without abnormal findings: Secondary | ICD-10-CM | POA: Diagnosis not present

## 2017-01-06 DIAGNOSIS — G894 Chronic pain syndrome: Secondary | ICD-10-CM | POA: Diagnosis not present

## 2017-01-07 DIAGNOSIS — E119 Type 2 diabetes mellitus without complications: Secondary | ICD-10-CM | POA: Diagnosis not present

## 2017-01-07 DIAGNOSIS — L84 Corns and callosities: Secondary | ICD-10-CM | POA: Diagnosis not present

## 2017-01-07 DIAGNOSIS — I209 Angina pectoris, unspecified: Secondary | ICD-10-CM | POA: Diagnosis not present

## 2017-01-07 DIAGNOSIS — E6609 Other obesity due to excess calories: Secondary | ICD-10-CM | POA: Diagnosis not present

## 2017-01-07 DIAGNOSIS — E669 Obesity, unspecified: Secondary | ICD-10-CM | POA: Diagnosis not present

## 2017-01-07 DIAGNOSIS — Z6831 Body mass index (BMI) 31.0-31.9, adult: Secondary | ICD-10-CM | POA: Diagnosis not present

## 2017-01-12 IMAGING — CR DG CERVICAL SPINE COMPLETE 4+V
7 series · 7 of 7 positions shown · non-contrast
Comparison: No prior.

CLINICAL DATA: Neck pain.

EXAM:
CERVICAL SPINE - COMPLETE 4+ VIEW

[view not recorded (1 of 7)]
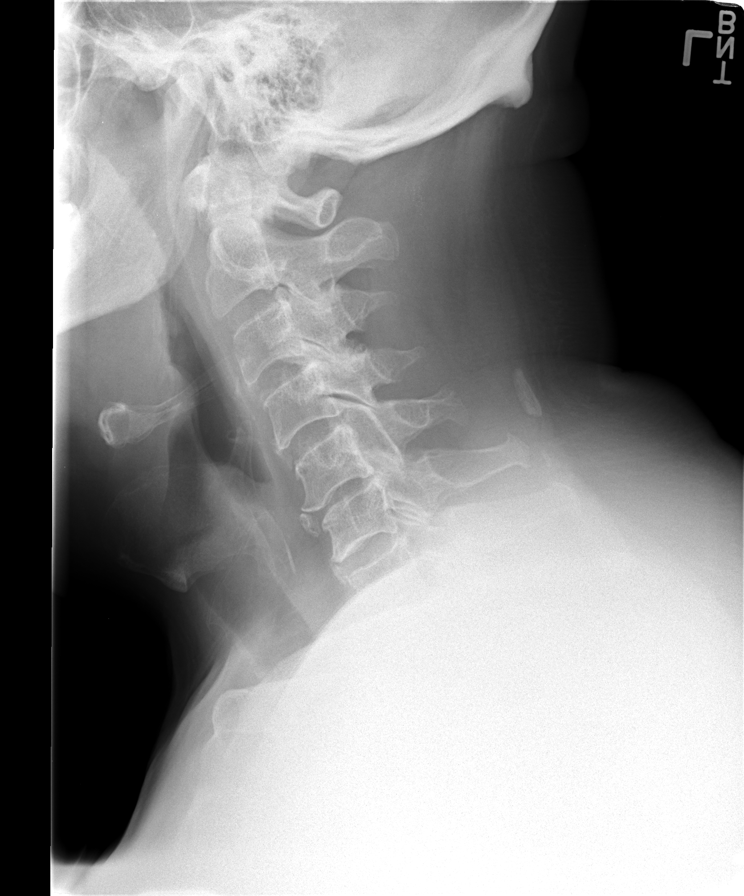

[view not recorded (2 of 7)]
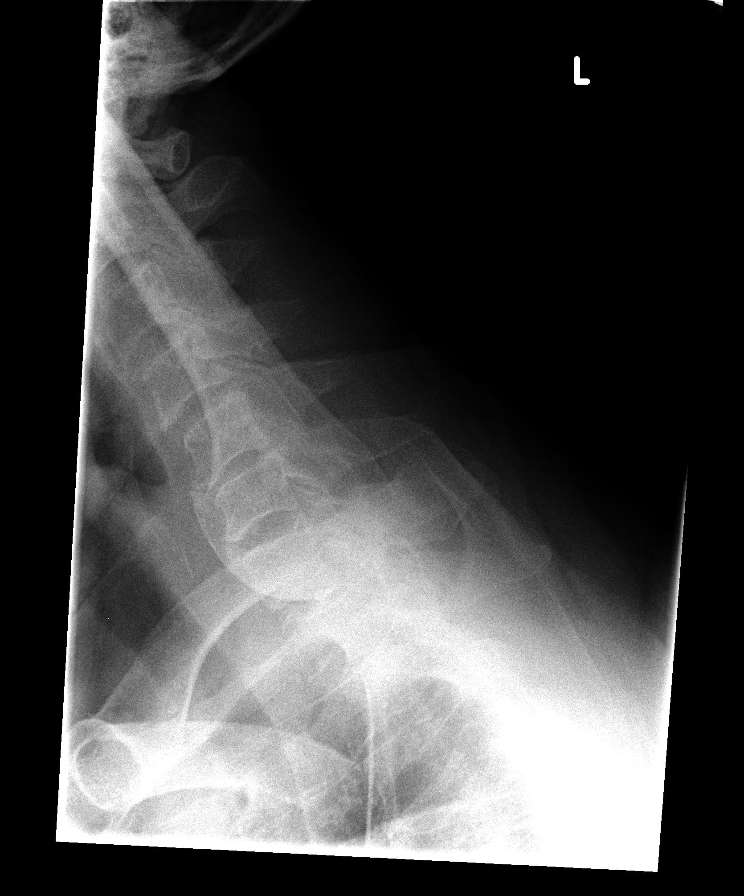

[view not recorded (3 of 7)]
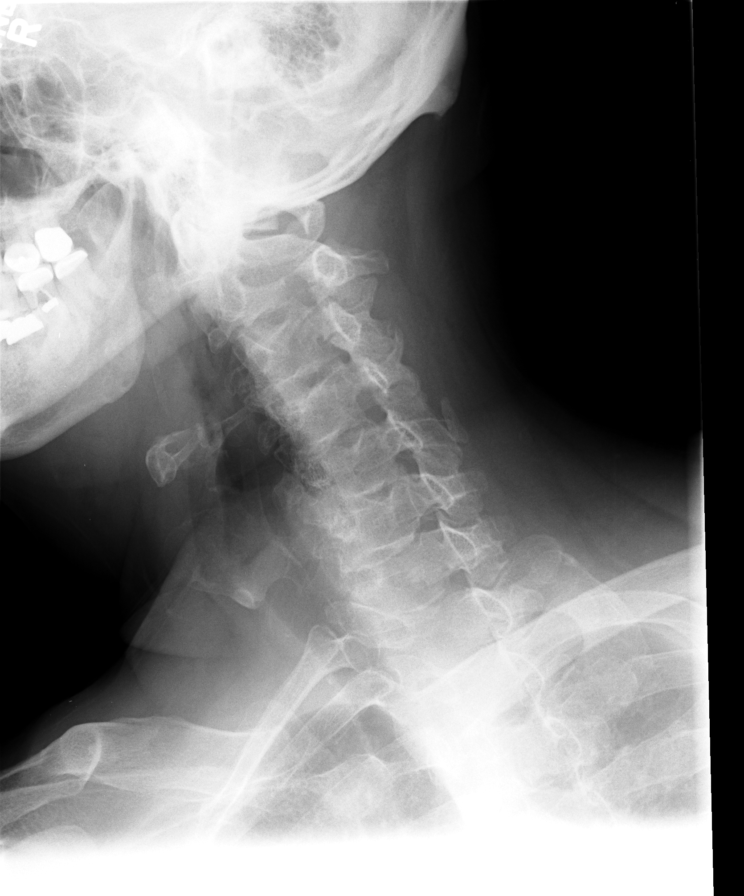

[view not recorded (4 of 7)]
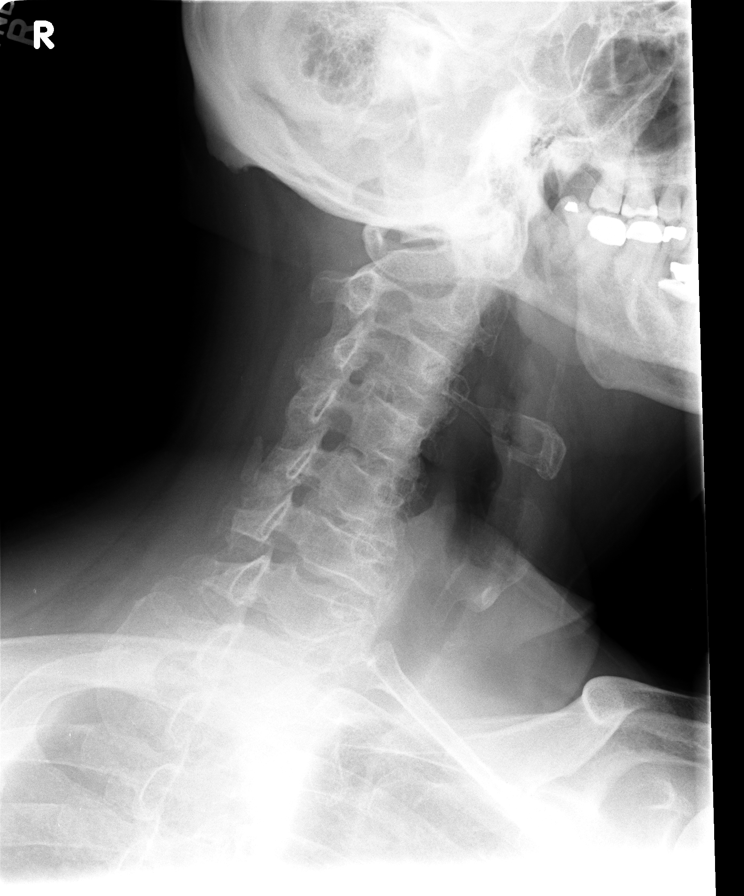

[view not recorded (5 of 7)]
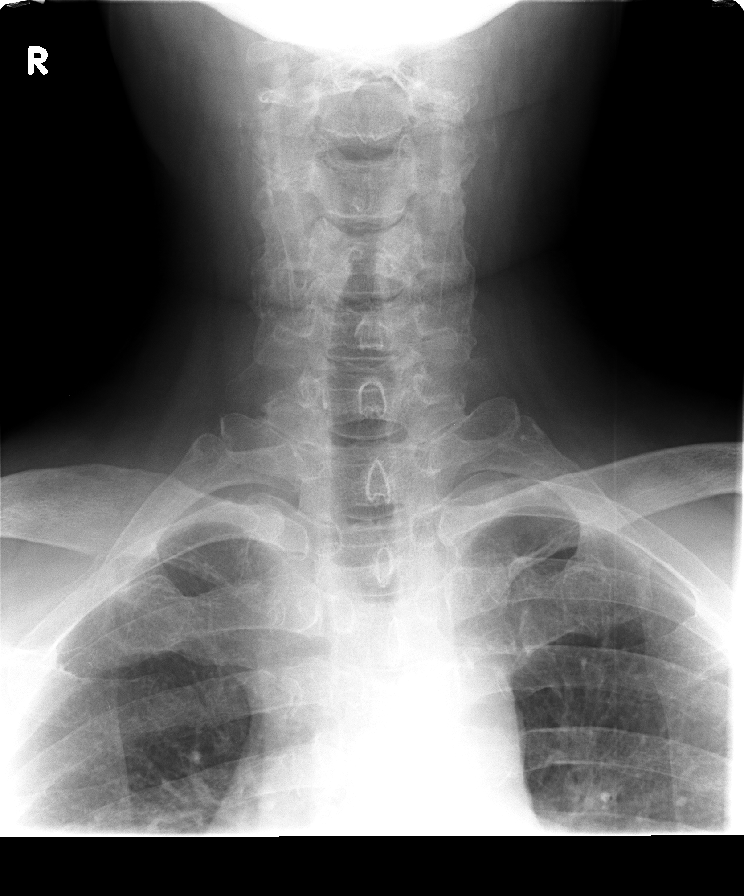

[view not recorded (6 of 7)]
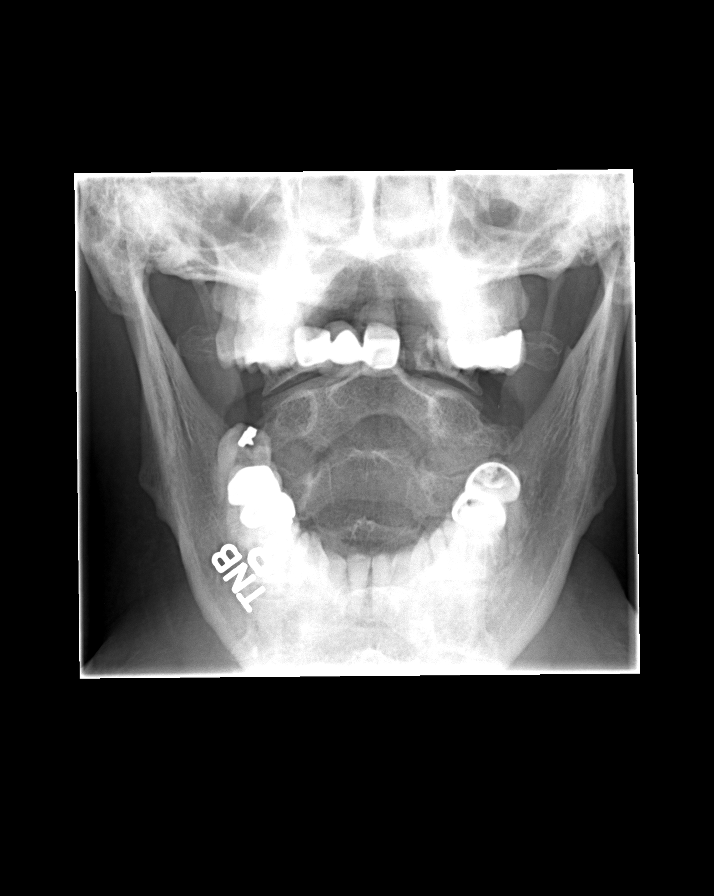

[view not recorded (7 of 7)]
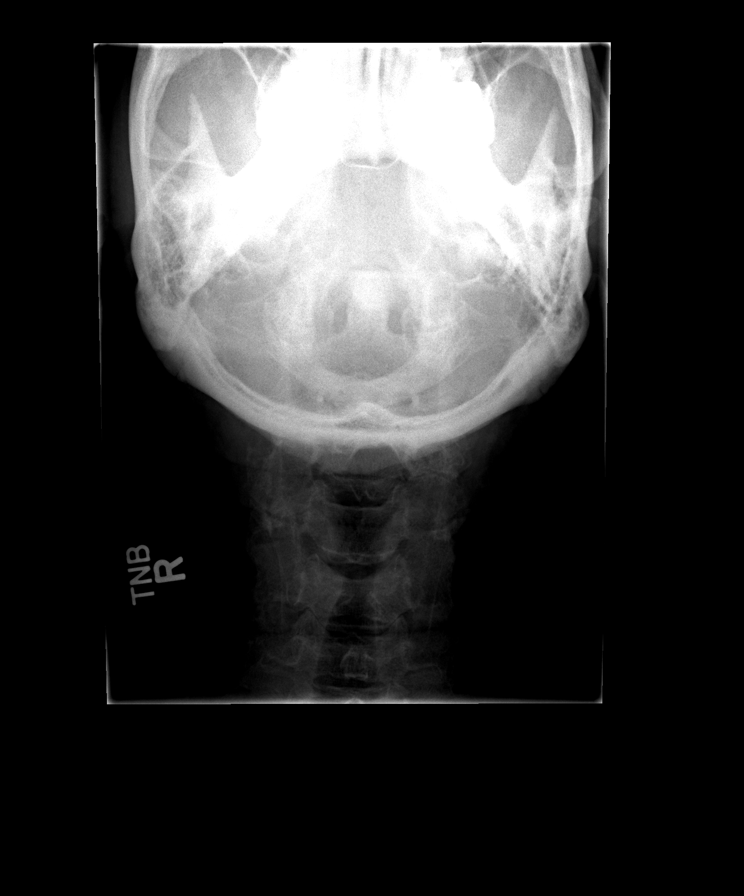

[7 of 7 positions shown; findings below may reference images not displayed]

FINDINGS: Diffuse degenerative change cervical spine. Bilateral C3-C4 mild
neural foraminal narrowing. No acute abnormality identified .
Pulmonary apices are clear.
IMPRESSION: Diffuse degenerative change. Bilateral C3-C4 mild neural foraminal
narrowing. No acute abnormality.

## 2017-01-17 ENCOUNTER — Other Ambulatory Visit: Payer: Self-pay | Admitting: Cardiovascular Disease

## 2017-01-21 DIAGNOSIS — E6609 Other obesity due to excess calories: Secondary | ICD-10-CM | POA: Diagnosis not present

## 2017-01-21 DIAGNOSIS — Z6831 Body mass index (BMI) 31.0-31.9, adult: Secondary | ICD-10-CM | POA: Diagnosis not present

## 2017-01-21 DIAGNOSIS — E782 Mixed hyperlipidemia: Secondary | ICD-10-CM | POA: Diagnosis not present

## 2017-02-03 ENCOUNTER — Other Ambulatory Visit: Payer: Self-pay | Admitting: Cardiovascular Disease

## 2017-02-22 DIAGNOSIS — E6609 Other obesity due to excess calories: Secondary | ICD-10-CM | POA: Diagnosis not present

## 2017-02-22 DIAGNOSIS — Z6831 Body mass index (BMI) 31.0-31.9, adult: Secondary | ICD-10-CM | POA: Diagnosis not present

## 2017-02-22 DIAGNOSIS — J069 Acute upper respiratory infection, unspecified: Secondary | ICD-10-CM | POA: Diagnosis not present

## 2017-06-09 DIAGNOSIS — Z1389 Encounter for screening for other disorder: Secondary | ICD-10-CM | POA: Diagnosis not present

## 2017-06-09 DIAGNOSIS — R05 Cough: Secondary | ICD-10-CM | POA: Diagnosis not present

## 2017-06-09 DIAGNOSIS — G894 Chronic pain syndrome: Secondary | ICD-10-CM | POA: Diagnosis not present

## 2017-06-09 DIAGNOSIS — E119 Type 2 diabetes mellitus without complications: Secondary | ICD-10-CM | POA: Diagnosis not present

## 2017-06-09 DIAGNOSIS — E1129 Type 2 diabetes mellitus with other diabetic kidney complication: Secondary | ICD-10-CM | POA: Diagnosis not present

## 2017-06-09 DIAGNOSIS — J020 Streptococcal pharyngitis: Secondary | ICD-10-CM | POA: Diagnosis not present

## 2017-06-09 DIAGNOSIS — Z7689 Persons encountering health services in other specified circumstances: Secondary | ICD-10-CM | POA: Diagnosis not present

## 2017-06-09 DIAGNOSIS — E6609 Other obesity due to excess calories: Secondary | ICD-10-CM | POA: Diagnosis not present

## 2017-06-09 DIAGNOSIS — Z6831 Body mass index (BMI) 31.0-31.9, adult: Secondary | ICD-10-CM | POA: Diagnosis not present

## 2017-06-09 DIAGNOSIS — I10 Essential (primary) hypertension: Secondary | ICD-10-CM | POA: Diagnosis not present

## 2017-06-27 ENCOUNTER — Other Ambulatory Visit: Payer: Self-pay | Admitting: Cardiovascular Disease

## 2017-08-04 ENCOUNTER — Encounter (HOSPITAL_COMMUNITY): Payer: Self-pay | Admitting: Emergency Medicine

## 2017-08-04 ENCOUNTER — Other Ambulatory Visit: Payer: Self-pay

## 2017-08-04 ENCOUNTER — Emergency Department (HOSPITAL_COMMUNITY): Payer: Medicare HMO

## 2017-08-04 ENCOUNTER — Observation Stay (HOSPITAL_COMMUNITY)
Admission: EM | Admit: 2017-08-04 | Discharge: 2017-08-06 | Disposition: A | Discharge status: Home / Self Care | Payer: Medicare HMO | Attending: Internal Medicine | Admitting: Internal Medicine

## 2017-08-04 DIAGNOSIS — Z7902 Long term (current) use of antithrombotics/antiplatelets: Secondary | ICD-10-CM | POA: Diagnosis not present

## 2017-08-04 DIAGNOSIS — R079 Chest pain, unspecified: Secondary | ICD-10-CM | POA: Diagnosis not present

## 2017-08-04 DIAGNOSIS — I10 Essential (primary) hypertension: Secondary | ICD-10-CM | POA: Diagnosis not present

## 2017-08-04 DIAGNOSIS — E119 Type 2 diabetes mellitus without complications: Secondary | ICD-10-CM | POA: Diagnosis not present

## 2017-08-04 DIAGNOSIS — Z79899 Other long term (current) drug therapy: Secondary | ICD-10-CM | POA: Diagnosis not present

## 2017-08-04 DIAGNOSIS — Z7982 Long term (current) use of aspirin: Secondary | ICD-10-CM | POA: Insufficient documentation

## 2017-08-04 DIAGNOSIS — I739 Peripheral vascular disease, unspecified: Secondary | ICD-10-CM | POA: Diagnosis not present

## 2017-08-04 DIAGNOSIS — E785 Hyperlipidemia, unspecified: Secondary | ICD-10-CM | POA: Insufficient documentation

## 2017-08-04 DIAGNOSIS — I2510 Atherosclerotic heart disease of native coronary artery without angina pectoris: Secondary | ICD-10-CM | POA: Insufficient documentation

## 2017-08-04 DIAGNOSIS — Z7984 Long term (current) use of oral hypoglycemic drugs: Secondary | ICD-10-CM | POA: Diagnosis not present

## 2017-08-04 DIAGNOSIS — Z951 Presence of aortocoronary bypass graft: Secondary | ICD-10-CM

## 2017-08-04 DIAGNOSIS — R109 Unspecified abdominal pain: Secondary | ICD-10-CM | POA: Diagnosis not present

## 2017-08-04 DIAGNOSIS — R1013 Epigastric pain: Secondary | ICD-10-CM | POA: Insufficient documentation

## 2017-08-04 DIAGNOSIS — R072 Precordial pain: Principal | ICD-10-CM | POA: Insufficient documentation

## 2017-08-04 LAB — CBC
HCT: 41.7 % (ref 39.0–52.0)
Hemoglobin: 13.6 g/dL (ref 13.0–17.0)
MCH: 30.1 pg (ref 26.0–34.0)
MCHC: 32.6 g/dL (ref 30.0–36.0)
MCV: 92.3 fL (ref 78.0–100.0)
Platelets: 198 10*3/uL (ref 150–400)
RBC: 4.52 MIL/uL (ref 4.22–5.81)
RDW: 13 % (ref 11.5–15.5)
WBC: 9.5 10*3/uL (ref 4.0–10.5)

## 2017-08-04 LAB — COMPREHENSIVE METABOLIC PANEL
ALBUMIN: 4.1 g/dL (ref 3.5–5.0)
ALK PHOS: 68 U/L (ref 38–126)
ALT: 47 U/L (ref 17–63)
ANION GAP: 12 (ref 5–15)
AST: 55 U/L — ABNORMAL HIGH (ref 15–41)
BILIRUBIN TOTAL: 0.6 mg/dL (ref 0.3–1.2)
BUN: 18 mg/dL (ref 6–20)
CALCIUM: 9.7 mg/dL (ref 8.9–10.3)
CO2: 23 mmol/L (ref 22–32)
Chloride: 106 mmol/L (ref 101–111)
Creatinine, Ser: 0.94 mg/dL (ref 0.61–1.24)
GFR calc Af Amer: >60 mL/min (ref >60)
GFR calc non Af Amer: >60 mL/min (ref >60)
GLUCOSE: 122 mg/dL — AB (ref 65–99)
Potassium: 3.8 mmol/L (ref 3.5–5.1)
Sodium: 141 mmol/L (ref 135–145)
TOTAL PROTEIN: 7.6 g/dL (ref 6.5–8.1)

## 2017-08-04 LAB — LIPASE, BLOOD: Lipase: 25 U/L (ref 11–51)

## 2017-08-04 LAB — TROPONIN I: Troponin I: <0.03 ng/mL (ref <0.03)

## 2017-08-04 MED ORDER — ONDANSETRON HCL 4 MG/2ML IJ SOLN
4.0000 mg | Freq: Once | INTRAMUSCULAR | Status: DC
  Filled 2017-08-04: qty 2

## 2017-08-04 MED ORDER — MORPHINE SULFATE (PF) 2 MG/ML IV SOLN
2.0000 mg | Freq: Once | INTRAVENOUS | Status: DC
  Filled 2017-08-04: qty 1

## 2017-08-04 MED ORDER — SODIUM CHLORIDE 0.9 % IV SOLN: INTRAVENOUS | Status: DC

## 2017-08-04 MED ORDER — IOPAMIDOL (ISOVUE-300) INJECTION 61%
100.0000 mL | Freq: Once | INTRAVENOUS | Status: AC | PRN
  Administered 2017-08-04: 100 mL via INTRAVENOUS

## 2017-08-04 MED ORDER — ASPIRIN 81 MG PO CHEW
324.0000 mg | CHEWABLE_TABLET | Freq: Once | ORAL | Status: AC
  Administered 2017-08-04: 243 mg via ORAL
  Filled 2017-08-04: qty 4

## 2017-08-04 NOTE — ED Provider Notes (Signed)
[facility] EMERGENCY DEPARTMENT Provider Note   CSN: [id number] Arrival date & time: 08/04/17  2027     History   Chief Complaint Chief Complaint  Patient presents with  . Chest Pain    HPI Jonathan Shepard is a 67 y.o. male.  Patient with known history of coronary disease.  Status post CABG 2017.  Patient had a very unusual event for him shortly prior to arrival approximately 1 hour prior to arrival patient had sudden onset of epigastric abdominal pain and lower substernal chest pain.  It was 10 out of 10.  Associated with diaphoresis.  Started in epigastric area then immediately went up the chest.  Patient was concerned that it could be a cardiac event again.  Patient does not normally have any chest pain or abdominal pain.  Associated with some mild nausea.  No shortness of breath.  Oxygen saturations on arrival was 100% on room air.  Patient upon arrival stated that pain was still 6 out of 10.  Patient took a 81 mg baby aspirin at home.  Patient is currently on Plavix.      Past Medical History:  Diagnosis Date  . CAD (coronary artery disease)   . Diabetes ([place])   . Dyspnea   . Hyperlipidemia   . Hypertension   . Obesity     Patient Active Problem List   Diagnosis Date Noted  . Transient Bifascicular block: RBBB+LAFB 04/23/2016  . S/P CABG x 4 04/22/2016  . Mixed hyperlipidemia 01/31/2013  . PAD (peripheral artery disease) ([place]) 01/31/2013  . CAD (coronary artery disease) 12/07/2012  . S/P angioplasty with stent-'02, '08. Low risk Myoview 2011 12/07/2012  . Diabetes mellitus ([place]) 12/07/2012  . Dyslipidemia 12/07/2012  . Family history of coronary artery disease 12/07/2012  . Chest pain with moderate risk for cardiac etiology 12/07/2012    Past Surgical History:  Procedure Laterality Date  . CARDIAC CATHETERIZATION N/A 04/14/2016   Procedure: Left Heart Cath and Coronary Angiography;  Surgeon: Peter M Martinique, MD;  Location: [facility];  Service:  Cardiovascular;  Laterality: N/A;  . CORONARY ARTERY BYPASS GRAFT N/A 04/22/2016   Procedure: CORONARY ARTERY BYPASS GRAFTING (CABG)x4 with endoscopic harvesting of right saphenous vein SVG to diagonal SVG to PDA Left Radial artery to OM 2 LIMA to LAD;  Surgeon: Melrose Nakayama, MD;  Location: [place];  Service: Open Heart Surgery;  Laterality: N/A;  . RADIAL ARTERY HARVEST Left 04/22/2016   Procedure: LEFT RADIAL ARTERY HARVEST;  Surgeon: Melrose Nakayama, MD;  Location: [place];  Service: Open Heart Surgery;  Laterality: Left;  . TEE WITHOUT CARDIOVERSION N/A 04/22/2016   Procedure: TRANSESOPHAGEAL ECHOCARDIOGRAM (TEE);  Surgeon: Melrose Nakayama, MD;  Location: [place];  Service: Open Heart Surgery;  Laterality: N/A;       Home Medications    Prior to Admission medications   Medication Sig Start Date End Date Taking? Authorizing Provider  aspirin EC 81 MG tablet Take 81 mg by mouth every morning.    Yes [provider]  clopidogrel (PLAVIX) 75 MG tablet TAKE 1 TABLET DAILY (NEEDS APPOINTMENT FOR REFILLS.) Patient taking differently: TAKE 1 TABLET DAILY 02/04/17  Yes Croitoru, Mihai, MD  diclofenac (VOLTAREN) 75 MG EC tablet Take 75 mg by mouth 2 (two) times daily.    Yes [provider]  glimepiride (AMARYL) 2 MG tablet Take 2 mg by mouth 2 (two) times daily. 06/27/17  Yes [provider]  metFORMIN (GLUCOPHAGE) 1000  MG tablet Take 1,000 mg by mouth 2 (two) times daily with a meal.   Yes [provider]  Multiple Vitamin (MULTIVITAMIN) tablet Take 1 tablet by mouth daily.   Yes [provider]  Omega-3 Fatty Acids (FISH OIL) 1200 MG CAPS Take 1 capsule by mouth 2 (two) times daily.    Yes [provider]  ramipril (ALTACE) 2.5 MG capsule TAKE 1 CAPSULE EVERY DAY 02/04/17  Yes Croitoru, Mihai, MD  Choline Fenofibrate (FENOFIBRIC ACID) 45 MG CPDR TAKE 1 CAPSULE EVERY DAY 06/28/17   Croitoru, Mihai, MD  metoprolol succinate  (TOPROL-XL) 25 MG 24 hr tablet TAKE 2 TABLETS EVERY DAY 10/22/16   Croitoru, Mihai, MD  metoprolol tartrate (LOPRESSOR) 25 MG tablet Take 1 tablet (25 mg total) by mouth 2 (two) times daily. 05/19/16   Barrett, Erin R, PA-C  pravastatin (PRAVACHOL) 80 MG tablet TAKE 1 TABLET EVERY DAY 01/18/17   Croitoru, Dani Gobble, MD    Family History Family History  Problem Relation Age of Onset  . Coronary artery disease Father 37       CABG  . Coronary artery disease Brother 48       CABG    Social History Social History   Tobacco Use  . Smoking status: Never Smoker  . Smokeless tobacco: Never Used  Substance Use Topics  . Alcohol use: No  . Drug use: No     Allergies   Patient has no known allergies.   Review of Systems Review of Systems  Constitutional: Positive for diaphoresis. Negative for fever.  HENT: Negative for congestion.   Eyes: Negative for redness.  Respiratory: Negative for shortness of breath.   Cardiovascular: Positive for chest pain.  Gastrointestinal: Positive for abdominal pain and nausea.  Genitourinary: Negative for dysuria.  Musculoskeletal: Negative for back pain.  Skin: Negative for rash.  Neurological: Negative for syncope, light-headedness and headaches.  Hematological: Does not bruise/bleed easily.  Psychiatric/Behavioral: Negative for confusion.     Physical Exam Updated Vital Signs BP (!) 156/91   Pulse 87   Temp 97.7 F (36.5 C)   Resp 18   Ht 1.829 m (6')   Wt 95.3 kg (210 lb)   SpO2 98%   BMI 28.48 kg/m   Physical Exam  Constitutional: He is oriented to person, place, and time. He appears well-developed and well-nourished. No distress.  HENT:  Head: Normocephalic and atraumatic.  Mouth/Throat: Oropharynx is clear and moist.  Eyes: Conjunctivae and EOM are normal. Pupils are equal, round, and reactive to light.  Neck: Normal range of motion. Neck supple.  Cardiovascular: Normal rate, regular rhythm and normal heart sounds.    Pulmonary/Chest: Effort normal and breath sounds normal. No respiratory distress.  Abdominal: Soft. Bowel sounds are normal. There is no tenderness.  Musculoskeletal: Normal range of motion.  Neurological: He is alert and oriented to person, place, and time. No cranial nerve deficit or sensory deficit. He exhibits normal muscle tone. Coordination normal.  Skin: Skin is warm.  Nursing note and vitals reviewed.    ED Treatments / Results  Labs (all labs ordered are listed, but only abnormal results are displayed) Labs Reviewed  COMPREHENSIVE METABOLIC PANEL - Abnormal; Notable for the following components:      Result Value   Glucose, Bld 122 (*)    AST 55 (*)    All other components within normal limits  CBC  TROPONIN I  LIPASE, BLOOD  URINALYSIS, ROUTINE W REFLEX MICROSCOPIC    EKG  EKG Interpretation  Date/Time:  Wednesday August 04 2017 20:34:12 EST Ventricular Rate:  74 PR Interval:  164 QRS Duration: 80 QT Interval:  398 QTC Calculation: 441 R Axis:   20 Text Interpretation:  Normal sinus rhythm Inferior infarct , age undetermined Abnormal ECG Confirmed by Fredia Sorrow [phone]) on 08/04/2017 9:18:23 PM       Radiology Dg Chest 2 View  Result Date: 08/04/2017 CLINICAL DATA:  Chest pain EXAM: CHEST  2 VIEW COMPARISON:  05/19/2016 FINDINGS: Post sternotomy changes. No acute consolidation or effusion. Normal cardiomediastinal silhouette with aortic atherosclerosis. No pneumothorax. IMPRESSION: No active cardiopulmonary disease. Electronically Signed   By: Donavan Foil M.D.   On: 08/04/2017 21:33   Ct Abdomen Pelvis W Contrast  Result Date: 08/04/2017 CLINICAL DATA:  67 y/o M; severe abdominal pain over a few days moving into the chest. EXAM: CT ABDOMEN AND PELVIS WITH CONTRAST TECHNIQUE: Multidetector CT imaging of the abdomen and pelvis was performed using the standard protocol following bolus administration of intravenous contrast. CONTRAST:  129mL ISOVUE-300  IOPAMIDOL (ISOVUE-300) INJECTION 61% COMPARISON:  08/14/2004 CT abdomen and pelvis. FINDINGS: Lower chest: 12 mm juxtapleural nodule of left lower lobe, 10 mm in 2006, with linear opacity extending toward the hilum, probably tiny focus of round atelectasis. Severe coronary artery calcific atherosclerosis. Healed partially visualize median sternotomy. Hepatobiliary: No focal liver abnormality is seen. No gallstones, gallbladder wall thickening, or biliary dilatation. Pancreas: Unremarkable. No pancreatic ductal dilatation or surrounding inflammatory changes. Spleen: Normal in size without focal abnormality. Adrenals/Urinary Tract: Adrenal glands are unremarkable. Punctate nonobstructing kidney stones bilaterally measuring up to 3 mm in the right kidney lower pole. No ureter stone or hydronephrosis. Right kidney cyst measuring up to 19 mm in the upper pole. Normal bladder. Stomach/Bowel: Stomach is within normal limits. Appendix appears normal. No evidence of bowel wall thickening, distention, or inflammatory changes. Vascular/Lymphatic: Aortic atherosclerosis. No enlarged abdominal or pelvic lymph nodes. Reproductive: Coarse central prostatic calcifications. Other: No abdominal wall hernia or abnormality. No abdominopelvic ascites. Musculoskeletal: No acute or significant osseous findings. Lumbar spondylosis with severe disc space narrowing at L5-S1. IMPRESSION: 1. No acute process identified. 2. Severe coronary artery calcific atherosclerosis. Mild abdominal aortic calcific atherosclerosis. 3. Punctate nonobstructing kidney stones bilaterally. No ureter stone or hydronephrosis. Electronically Signed   By: Kristine Garbe M.D.   On: 08/04/2017 22:48    Procedures Procedures (including critical care time)  Medications Ordered in ED Medications  ondansetron (ZOFRAN) injection 4 mg (4 mg Intravenous Refused 08/04/17 2135)  0.9 %  sodium chloride infusion ( Intravenous Not Given 08/04/17 2135)  morphine  2 MG/ML injection 2 mg (2 mg Intravenous Refused 08/04/17 2135)  aspirin chewable tablet 324 mg (243 mg Oral Given 08/04/17 2134)  iopamidol (ISOVUE-300) 61 % injection 100 mL (100 mLs Intravenous Contrast Given 08/04/17 2230)     Initial Impression / Assessment and Plan / ED Course  I have reviewed the triage vital signs and the nursing notes.  Pertinent labs & imaging results that were available during my care of the patient were reviewed by me and considered in my medical decision making (see chart for details).     Patient given full aspirin upon arrival.  Patient also had morphine ordered because of the epigastric discomfort.  Plan was to approach this as potential upper abdomen source or perhaps even a cardiac source.  Chest x-ray negative EKG had some subtle ST changes compared to old in lead III only.  First troponin was  negative labs without significant abnormalities.  CT scan of the abdomen without any acute abdominal process.  No evidence of any gallbladder problems.  The following the aspirin pain went to 0 out of 10 both in the belly and the chest patient did have morphine ordered but never received it but he refused it because the pain had resolved.  Pain is remained away.  Discussed with hospitalist for consideration for admission for cardiac rule out since the acute event which came on suddenly and was quite severe and associated with diaphoresis is currently somewhat unexplained.  Although since patient recently had CABG one would not expect that there would be any significant large vessel coronary artery disease.  Hospitalist will assess.  Final Clinical Impressions(s) / ED Diagnoses   Final diagnoses:  Precordial pain    ED Discharge Orders    None       Fredia Sorrow, MD 08/04/17 2354

## 2017-08-04 NOTE — ED Triage Notes (Signed)
Pt c/o severe abd pain that has since moved into chest.

## 2017-08-05 ENCOUNTER — Other Ambulatory Visit: Payer: Self-pay

## 2017-08-05 ENCOUNTER — Encounter (HOSPITAL_COMMUNITY): Payer: Self-pay

## 2017-08-05 DIAGNOSIS — R072 Precordial pain: Secondary | ICD-10-CM | POA: Diagnosis not present

## 2017-08-05 DIAGNOSIS — R079 Chest pain, unspecified: Secondary | ICD-10-CM | POA: Diagnosis not present

## 2017-08-05 LAB — GLUCOSE, CAPILLARY
GLUCOSE-CAPILLARY: 187 mg/dL — AB (ref 65–99)
GLUCOSE-CAPILLARY: 117 mg/dL — AB (ref 65–99)
GLUCOSE-CAPILLARY: 132 mg/dL — AB (ref 65–99)
Glucose-Capillary: 194 mg/dL — ABNORMAL HIGH (ref 65–99)

## 2017-08-05 LAB — URINALYSIS, ROUTINE W REFLEX MICROSCOPIC
Bilirubin Urine: NEGATIVE
GLUCOSE, UA: 50 mg/dL — AB
HGB URINE DIPSTICK: NEGATIVE
Ketones, ur: NEGATIVE mg/dL
LEUKOCYTES UA: NEGATIVE
Nitrite: NEGATIVE
PROTEIN: NEGATIVE mg/dL
Specific Gravity, Urine: 1.013 (ref 1.005–1.030)
pH: 6 (ref 5.0–8.0)

## 2017-08-05 LAB — CBC
HCT: 39.1 % (ref 39.0–52.0)
Hemoglobin: 12.9 g/dL — ABNORMAL LOW (ref 13.0–17.0)
MCH: 30.2 pg (ref 26.0–34.0)
MCHC: 33 g/dL (ref 30.0–36.0)
MCV: 91.6 fL (ref 78.0–100.0)
PLATELETS: 166 10*3/uL (ref 150–400)
RBC: 4.27 MIL/uL (ref 4.22–5.81)
RDW: 12.9 % (ref 11.5–15.5)
WBC: 6.9 10*3/uL (ref 4.0–10.5)

## 2017-08-05 LAB — BASIC METABOLIC PANEL
Anion gap: 10 (ref 5–15)
BUN: 16 mg/dL (ref 6–20)
CHLORIDE: 105 mmol/L (ref 101–111)
CO2: 22 mmol/L (ref 22–32)
CREATININE: 0.99 mg/dL (ref 0.61–1.24)
Calcium: 9.5 mg/dL (ref 8.9–10.3)
GFR calc Af Amer: >60 mL/min (ref >60)
GFR calc non Af Amer: >60 mL/min (ref >60)
Glucose, Bld: 199 mg/dL — ABNORMAL HIGH (ref 65–99)
Potassium: 4.1 mmol/L (ref 3.5–5.1)
Sodium: 137 mmol/L (ref 135–145)

## 2017-08-05 LAB — TROPONIN I
Troponin I: <0.03 ng/mL (ref <0.03)
Troponin I: <0.03 ng/mL (ref <0.03)

## 2017-08-05 MED ORDER — CLOPIDOGREL BISULFATE 75 MG PO TABS
75.0000 mg | ORAL_TABLET | Freq: Every day | ORAL | Status: DC
  Administered 2017-08-05 – 2017-08-06 (×2): 75 mg via ORAL
  Filled 2017-08-05 (×2): qty 1

## 2017-08-05 MED ORDER — PANTOPRAZOLE SODIUM 40 MG PO TBEC
40.0000 mg | DELAYED_RELEASE_TABLET | Freq: Every day | ORAL | Status: DC
  Administered 2017-08-05: 40 mg via ORAL
  Filled 2017-08-05 (×2): qty 1

## 2017-08-05 MED ORDER — ACETAMINOPHEN 650 MG RE SUPP: 650.0000 mg | Freq: Four times a day (QID) | RECTAL | Status: DC | PRN

## 2017-08-05 MED ORDER — ASPIRIN EC 81 MG PO TBEC
81.0000 mg | DELAYED_RELEASE_TABLET | Freq: Every morning | ORAL | Status: DC
  Administered 2017-08-05 – 2017-08-06 (×2): 81 mg via ORAL
  Filled 2017-08-05 (×2): qty 1

## 2017-08-05 MED ORDER — METOPROLOL TARTRATE 25 MG PO TABS
25.0000 mg | ORAL_TABLET | Freq: Two times a day (BID) | ORAL | Status: DC
  Administered 2017-08-05 – 2017-08-06 (×3): 25 mg via ORAL
  Filled 2017-08-05 (×3): qty 1

## 2017-08-05 MED ORDER — SODIUM CHLORIDE 0.9 % IV SOLN: 250.0000 mL | INTRAVENOUS | Status: DC | PRN

## 2017-08-05 MED ORDER — PRAVASTATIN SODIUM 40 MG PO TABS
80.0000 mg | ORAL_TABLET | Freq: Every day | ORAL | Status: DC
  Administered 2017-08-05 – 2017-08-06 (×2): 80 mg via ORAL
  Filled 2017-08-05: qty 1
  Filled 2017-08-05 (×2): qty 2
  Filled 2017-08-05: qty 1

## 2017-08-05 MED ORDER — INSULIN ASPART 100 UNIT/ML ~~LOC~~ SOLN
0.0000 [IU] | Freq: Three times a day (TID) | SUBCUTANEOUS | Status: DC
  Administered 2017-08-05 (×2): 3 [IU] via SUBCUTANEOUS
  Administered 2017-08-06: 2 [IU] via SUBCUTANEOUS
  Administered 2017-08-06: 3 [IU] via SUBCUTANEOUS
  Administered 2017-08-06: 2 [IU] via SUBCUTANEOUS

## 2017-08-05 MED ORDER — RAMIPRIL 1.25 MG PO CAPS
2.5000 mg | ORAL_CAPSULE | Freq: Every day | ORAL | Status: DC
  Administered 2017-08-05 – 2017-08-06 (×2): 2.5 mg via ORAL
  Filled 2017-08-05 (×2): qty 1
  Filled 2017-08-05 (×2): qty 2

## 2017-08-05 MED ORDER — ACETAMINOPHEN 325 MG PO TABS: 650.0000 mg | ORAL_TABLET | Freq: Four times a day (QID) | ORAL | Status: DC | PRN

## 2017-08-05 MED ORDER — FENOFIBRIC ACID 45 MG PO CPDR: 1.0000 | DELAYED_RELEASE_CAPSULE | Freq: Every day | ORAL | Status: DC

## 2017-08-05 MED ORDER — FENOFIBRATE 54 MG PO TABS
54.0000 mg | ORAL_TABLET | Freq: Every day | ORAL | Status: DC
  Administered 2017-08-05 – 2017-08-06 (×2): 54 mg via ORAL
  Filled 2017-08-05 (×5): qty 1

## 2017-08-05 MED ORDER — ENOXAPARIN SODIUM 40 MG/0.4ML ~~LOC~~ SOLN
40.0000 mg | SUBCUTANEOUS | Status: DC
  Administered 2017-08-05 – 2017-08-06 (×2): 40 mg via SUBCUTANEOUS
  Filled 2017-08-05 (×2): qty 0.4

## 2017-08-05 MED ORDER — METOPROLOL SUCCINATE ER 50 MG PO TB24
50.0000 mg | ORAL_TABLET | Freq: Every day | ORAL | Status: DC
  Filled 2017-08-05: qty 1

## 2017-08-05 MED ORDER — OMEGA-3-ACID ETHYL ESTERS 1 G PO CAPS
1.0000 g | ORAL_CAPSULE | Freq: Two times a day (BID) | ORAL | Status: DC
  Administered 2017-08-05 – 2017-08-06 (×3): 1 g via ORAL
  Filled 2017-08-05 (×3): qty 1

## 2017-08-05 MED ORDER — ALUM & MAG HYDROXIDE-SIMETH 200-200-20 MG/5ML PO SUSP: 30.0000 mL | ORAL | Status: DC | PRN

## 2017-08-05 MED ORDER — SODIUM CHLORIDE 0.9% FLUSH
3.0000 mL | Freq: Two times a day (BID) | INTRAVENOUS | Status: DC
  Administered 2017-08-05 (×3): 3 mL via INTRAVENOUS

## 2017-08-05 MED ORDER — ADULT MULTIVITAMIN W/MINERALS CH
1.0000 | ORAL_TABLET | Freq: Every day | ORAL | Status: DC
  Administered 2017-08-05 – 2017-08-06 (×2): 1 via ORAL
  Filled 2017-08-05 (×4): qty 1

## 2017-08-05 MED ORDER — ONDANSETRON HCL 4 MG/2ML IJ SOLN
4.0000 mg | Freq: Four times a day (QID) | INTRAMUSCULAR | Status: DC | PRN
  Filled 2017-08-05: qty 2

## 2017-08-05 MED ORDER — INSULIN ASPART 100 UNIT/ML ~~LOC~~ SOLN: 0.0000 [IU] | Freq: Every day | SUBCUTANEOUS | Status: DC

## 2017-08-05 MED ORDER — ONDANSETRON HCL 4 MG PO TABS: 4.0000 mg | ORAL_TABLET | Freq: Four times a day (QID) | ORAL | Status: DC | PRN

## 2017-08-05 MED ORDER — SODIUM CHLORIDE 0.9% FLUSH: 3.0000 mL | INTRAVENOUS | Status: DC | PRN

## 2017-08-05 NOTE — Plan of Care (Signed)
Patient is progressing.  

## 2017-08-05 NOTE — H&P (Signed)
History and Physical    Jonathan Shepard UXN:[id number] DOB: 07-05-1950 DOA: 08/04/2017  PCP: Redmond School, MD   Patient coming from: Home  Chief Complaint: Chest/epigastric pain  HPI: Jonathan Shepard is a 67 y.o. male with medical history significant for CAD with prior CABG in 2017 who presented to the ED with sudden onset epigastric abdominal pain and lower substernal chest pain that he rated a 10 out of 10.  This began immediately after he had a meal of some broiled fish, and salad.  He states that he has never had an MI before, but he did have an artery occluded during a stenting procedure when he had some chest pain and states that this episode does not feel like that at all.  He had some associated diaphoresis. He had some associated nausea but no vomiting. He has had a bowel movement this morning with no change to stool color or blood noted.   ED Course: Patient's pain has improved spontaneously after receiving some full dose aspirin.  Vital signs have remained stable and he is currently on room air.  EKG with normal sinus rhythm and no significant abnormalities.  Labs within normal limits.  Troponin is less than 0.03 and chest x-ray as well as CT of the abdomen appear to have no acute findings.  Review of Systems: All others reviewed and otherwise negative.  Past Medical History:  Diagnosis Date  . CAD (coronary artery disease)   . Diabetes ([place])   . Dyspnea   . Hyperlipidemia   . Hypertension   . Obesity     Past Surgical History:  Procedure Laterality Date  . CARDIAC CATHETERIZATION N/A 04/14/2016   Procedure: Left Heart Cath and Coronary Angiography;  Surgeon: Peter M Martinique, MD;  Location: [facility];  Service: Cardiovascular;  Laterality: N/A;  . CORONARY ARTERY BYPASS GRAFT N/A 04/22/2016   Procedure: CORONARY ARTERY BYPASS GRAFTING (CABG)x4 with endoscopic harvesting of right saphenous vein SVG to diagonal SVG to PDA Left Radial artery to OM 2 LIMA to  LAD;  Surgeon: Melrose Nakayama, MD;  Location: [facility];  Service: Open Heart Surgery;  Laterality: N/A;  . RADIAL ARTERY HARVEST Left 04/22/2016   Procedure: LEFT RADIAL ARTERY HARVEST;  Surgeon: Melrose Nakayama, MD;  Location: [facility];  Service: Open Heart Surgery;  Laterality: Left;  . TEE WITHOUT CARDIOVERSION N/A 04/22/2016   Procedure: TRANSESOPHAGEAL ECHOCARDIOGRAM (TEE);  Surgeon: Melrose Nakayama, MD;  Location: [facility];  Service: Open Heart Surgery;  Laterality: N/A;     reports that  has never smoked. he has never used smokeless tobacco. He reports that he does not drink alcohol or use drugs.  No Known Allergies  Family History  Problem Relation Age of Onset  . Coronary artery disease Father 43       CABG  . Coronary artery disease Brother 62       CABG    Prior to Admission medications   Medication Sig Start Date End Date Taking? Authorizing Provider  aspirin EC 81 MG tablet Take 81 mg by mouth every morning.    Yes [provider]  clopidogrel (PLAVIX) 75 MG tablet TAKE 1 TABLET DAILY (NEEDS APPOINTMENT FOR REFILLS.) Patient taking differently: TAKE 1 TABLET DAILY 02/04/17  Yes Croitoru, Mihai, MD  diclofenac (VOLTAREN) 75 MG EC tablet Take 75 mg by mouth 2 (two) times daily.    Yes [provider]  glimepiride (AMARYL) 2 MG tablet Take 2 mg by  mouth 2 (two) times daily. 06/27/17  Yes [provider]  metFORMIN (GLUCOPHAGE) 1000 MG tablet Take 1,000 mg by mouth 2 (two) times daily with a meal.   Yes [provider]  Multiple Vitamin (MULTIVITAMIN) tablet Take 1 tablet by mouth daily.   Yes [provider]  Omega-3 Fatty Acids (FISH OIL) 1200 MG CAPS Take 1 capsule by mouth 2 (two) times daily.    Yes [provider]  ramipril (ALTACE) 2.5 MG capsule TAKE 1 CAPSULE EVERY DAY 02/04/17  Yes Croitoru, Mihai, MD  Choline Fenofibrate (FENOFIBRIC ACID) 45 MG CPDR TAKE 1 CAPSULE EVERY DAY 06/28/17   Croitoru, Mihai, MD    metoprolol succinate (TOPROL-XL) 25 MG 24 hr tablet TAKE 2 TABLETS EVERY DAY 10/22/16   Croitoru, Mihai, MD  metoprolol tartrate (LOPRESSOR) 25 MG tablet Take 1 tablet (25 mg total) by mouth 2 (two) times daily. 05/19/16   Barrett, Erin R, PA-C  pravastatin (PRAVACHOL) 80 MG tablet TAKE 1 TABLET EVERY DAY 01/18/17   Croitoru, Dani Gobble, MD    Physical Exam: Vitals:   08/04/17 2215 08/04/17 2245 08/04/17 2330 08/05/17 0000  BP:   (!) 157/91 (!) 165/86  Pulse: 82 87 80 84  Resp: 17 18 (!) 22 (!) 9  Temp:      SpO2: 100% 98% 97% 96%  Weight:      Height:        Constitutional: NAD, calm, comfortable Vitals:   08/04/17 2215 08/04/17 2245 08/04/17 2330 08/05/17 0000  BP:   (!) 157/91 (!) 165/86  Pulse: 82 87 80 84  Resp: 17 18 (!) 22 (!) 9  Temp:      SpO2: 100% 98% 97% 96%  Weight:      Height:       Eyes: lids and conjunctivae normal ENMT: Mucous membranes are moist.  Neck: normal, supple Respiratory: clear to auscultation bilaterally. Normal respiratory effort. No accessory muscle use.  Cardiovascular: Regular rate and rhythm, no murmurs. No extremity edema. Abdomen: no tenderness, no distention. Bowel sounds positive.  Musculoskeletal:  No joint deformity upper and lower extremities.   Skin: no rashes, lesions, ulcers.  Psychiatric: Normal judgment and insight. Alert and oriented x 3. Normal mood.   Labs on Admission: I have personally reviewed following labs and imaging studies  CBC: Recent Labs  Lab 08/04/17 2044  WBC 9.5  HGB 13.6  HCT 41.7  MCV 92.3  PLT 542   Basic Metabolic Panel: Recent Labs  Lab 08/04/17 2044  NA 141  K 3.8  CL 106  CO2 23  GLUCOSE 122*  BUN 18  CREATININE 0.94  CALCIUM 9.7   GFR: Estimated Creatinine Clearance: 95.1 mL/min (by C-G formula based on SCr of 0.94 mg/dL). Liver Function Tests: Recent Labs  Lab 08/04/17 2044  AST 55*  ALT 47  ALKPHOS 68  BILITOT 0.6  PROT 7.6  ALBUMIN 4.1   Recent Labs  Lab 08/04/17 2044   LIPASE 25   No results for input(s): AMMONIA in the last 168 hours. Coagulation Profile: No results for input(s): INR, PROTIME in the last 168 hours. Cardiac Enzymes: Recent Labs  Lab 08/04/17 2044  TROPONINI <0.03   BNP (last 3 results) No results for input(s): PROBNP in the last 8760 hours. HbA1C: No results for input(s): HGBA1C in the last 72 hours. CBG: No results for input(s): GLUCAP in the last 168 hours. Lipid Profile: No results for input(s): CHOL, HDL, LDLCALC, TRIG, CHOLHDL, LDLDIRECT in the last 72 hours.  Thyroid Function Tests: No results for input(s): TSH, T4TOTAL, FREET4, T3FREE, THYROIDAB in the last 72 hours. Anemia Panel: No results for input(s): VITAMINB12, FOLATE, FERRITIN, TIBC, IRON, RETICCTPCT in the last 72 hours. Urine analysis:    Component Value Date/Time   COLORURINE YELLOW 04/20/2016 [place] 04/20/2016 1528   LABSPEC 1.006 04/20/2016 1528   PHURINE 5.5 04/20/2016 1528   GLUCOSEU NEGATIVE 04/20/2016 1528   HGBUR NEGATIVE 04/20/2016 [place] 04/20/2016 1528   KETONESUR NEGATIVE 04/20/2016 1528   PROTEINUR NEGATIVE 04/20/2016 1528   NITRITE NEGATIVE 04/20/2016 [place] 04/20/2016 1528    Radiological Exams on Admission: Dg Chest 2 View  Result Date: 08/04/2017 CLINICAL DATA:  Chest pain EXAM: CHEST  2 VIEW COMPARISON:  05/19/2016 FINDINGS: Post sternotomy changes. No acute consolidation or effusion. Normal cardiomediastinal silhouette with aortic atherosclerosis. No pneumothorax. IMPRESSION: No active cardiopulmonary disease. Electronically Signed   By: Donavan Foil M.D.   On: 08/04/2017 21:33   Ct Abdomen Pelvis W Contrast  Result Date: 08/04/2017 CLINICAL DATA:  67 y/o M; severe abdominal pain over a few days moving into the chest. EXAM: CT ABDOMEN AND PELVIS WITH CONTRAST TECHNIQUE: Multidetector CT imaging of the abdomen and pelvis was performed using the standard protocol following  bolus administration of intravenous contrast. CONTRAST:  149mL ISOVUE-300 IOPAMIDOL (ISOVUE-300) INJECTION 61% COMPARISON:  08/14/2004 CT abdomen and pelvis. FINDINGS: Lower chest: 12 mm juxtapleural nodule of left lower lobe, 10 mm in 2006, with linear opacity extending toward the hilum, probably tiny focus of round atelectasis. Severe coronary artery calcific atherosclerosis. Healed partially visualize median sternotomy. Hepatobiliary: No focal liver abnormality is seen. No gallstones, gallbladder wall thickening, or biliary dilatation. Pancreas: Unremarkable. No pancreatic ductal dilatation or surrounding inflammatory changes. Spleen: Normal in size without focal abnormality. Adrenals/Urinary Tract: Adrenal glands are unremarkable. Punctate nonobstructing kidney stones bilaterally measuring up to 3 mm in the right kidney lower pole. No ureter stone or hydronephrosis. Right kidney cyst measuring up to 19 mm in the upper pole. Normal bladder. Stomach/Bowel: Stomach is within normal limits. Appendix appears normal. No evidence of bowel wall thickening, distention, or inflammatory changes. Vascular/Lymphatic: Aortic atherosclerosis. No enlarged abdominal or pelvic lymph nodes. Reproductive: Coarse central prostatic calcifications. Other: No abdominal wall hernia or abnormality. No abdominopelvic ascites. Musculoskeletal: No acute or significant osseous findings. Lumbar spondylosis with severe disc space narrowing at L5-S1. IMPRESSION: 1. No acute process identified. 2. Severe coronary artery calcific atherosclerosis. Mild abdominal aortic calcific atherosclerosis. 3. Punctate nonobstructing kidney stones bilaterally. No ureter stone or hydronephrosis. Electronically Signed   By: Kristine Garbe M.D.   On: 08/04/2017 22:48    EKG: Independently reviewed. NSR; age-indeterminate infarct.  Assessment/Plan Principal Problem:   Chest pain with moderate risk for cardiac etiology Active Problems:   CAD  (coronary artery disease)   Diabetes mellitus ([facility])   Dyslipidemia   PAD (peripheral artery disease) ([facility])   S/P CABG x 4    1. Atypical chest pain-much improved.  This appears to be related more to GI etiology and perhaps could be attributed to GERD, gastritis, esophageal spasm, or some other similar process.  Notably, he does appear to take some NSAIDs on a regular basis.  I will hold this for now. No significant LFT elevation noted. Will initiate PPI oral daily with Maalox as needed for indigestion.  Start on clear liquid diet and advance as tolerated.  Continue to monitor troponins and consider 2D echo should there  be an elevation.  We will continue to monitor on telemetry. 2. Type 2 diabetes.  Hold home medications for now and start SSI. Maintain on heart healthy/carb modified diet. 3. CAD with prior CABG.  Continue dual antiplatelet therapy, along with statin, and beta-blocker. 4. Dyslipidemia.  Continue statin.   DVT prophylaxis: Lovenox Code Status: Full Family Communication: Wife, Ann at bedside Disposition Plan:Trend troponin; likely home in am Consults called:None Admission status: Obs, tele   Yoali Conry Darleen Crocker DO [facility] Pager [phone]  If 7PM-7AM, please contact night-coverage [email or web address] Password [email or web address]  08/05/2017, 1:01 AM

## 2017-08-05 NOTE — ED Notes (Signed)
Report given to [facility] on 300

## 2017-08-05 NOTE — Progress Notes (Addendum)
Jonathan Shepard is a 67 y.o. male with medical history significant for CAD with prior CABG in 2017 who presented to the ED with sudden onset epigastric abdominal pain and lower substernal chest pain that he rated a 10 out of 10.  This began immediately after he had a meal of some broiled fish, and salad.  He states that he has never had an MI before, but he did have an artery occluded during a stenting procedure when he had some chest pain and states that this episode does not feel like that at all. Admitted for chest pain r/o ACS.  08/05/17:  troponin negative x 3, ekg sinus rhythm. Appears this is most likely 2/2 to GI symptoms.  RN reports loose stools x2 and nausea x1 vomiting. Will hold off cardiology consult for now.  Please refer to H&P dictated by Dr. Manuella Ghazi on 08/05/17 for further assessment and plan.

## 2017-08-05 NOTE — Care Management Obs Status (Signed)
[facility] NOTIFICATION   Patient Details  Name: LINKIN VIZZINI MRN: [id number] Date of Birth: [birth year]   Medicare Observation Status Notification Given:  Yes    Sherald Barge, RN 08/05/2017, 10:30 AM

## 2017-08-06 DIAGNOSIS — E1151 Type 2 diabetes mellitus with diabetic peripheral angiopathy without gangrene: Secondary | ICD-10-CM

## 2017-08-06 DIAGNOSIS — R072 Precordial pain: Secondary | ICD-10-CM

## 2017-08-06 DIAGNOSIS — E785 Hyperlipidemia, unspecified: Secondary | ICD-10-CM

## 2017-08-06 DIAGNOSIS — Z951 Presence of aortocoronary bypass graft: Secondary | ICD-10-CM

## 2017-08-06 DIAGNOSIS — I25118 Atherosclerotic heart disease of native coronary artery with other forms of angina pectoris: Secondary | ICD-10-CM

## 2017-08-06 DIAGNOSIS — R079 Chest pain, unspecified: Secondary | ICD-10-CM

## 2017-08-06 DIAGNOSIS — I739 Peripheral vascular disease, unspecified: Secondary | ICD-10-CM

## 2017-08-06 LAB — GASTROINTESTINAL PANEL BY PCR, STOOL (REPLACES STOOL CULTURE)
ADENOVIRUS F40/41: NOT DETECTED
ASTROVIRUS: NOT DETECTED
Campylobacter species: NOT DETECTED
Cryptosporidium: NOT DETECTED
Cyclospora cayetanensis: NOT DETECTED
ENTEROAGGREGATIVE E COLI (EAEC): NOT DETECTED
ENTEROPATHOGENIC E COLI (EPEC): NOT DETECTED
ENTEROTOXIGENIC E COLI (ETEC): NOT DETECTED
Entamoeba histolytica: NOT DETECTED
GIARDIA LAMBLIA: NOT DETECTED
NOROVIRUS GI/GII: NOT DETECTED
Plesimonas shigelloides: NOT DETECTED
Rotavirus A: NOT DETECTED
SHIGA LIKE TOXIN PRODUCING E COLI (STEC): NOT DETECTED
Salmonella species: NOT DETECTED
Sapovirus (I, II, IV, and V): NOT DETECTED
Shigella/Enteroinvasive E coli (EIEC): NOT DETECTED
VIBRIO CHOLERAE: NOT DETECTED
Vibrio species: NOT DETECTED
Yersinia enterocolitica: NOT DETECTED

## 2017-08-06 LAB — GLUCOSE, CAPILLARY
GLUCOSE-CAPILLARY: 144 mg/dL — AB (ref 65–99)
GLUCOSE-CAPILLARY: 121 mg/dL — AB (ref 65–99)
Glucose-Capillary: 160 mg/dL — ABNORMAL HIGH (ref 65–99)

## 2017-08-06 LAB — CBC
HCT: 42.4 % (ref 39.0–52.0)
Hemoglobin: 13.8 g/dL (ref 13.0–17.0)
MCH: 30.3 pg (ref 26.0–34.0)
MCHC: 32.5 g/dL (ref 30.0–36.0)
MCV: 93 fL (ref 78.0–100.0)
PLATELETS: 181 10*3/uL (ref 150–400)
RBC: 4.56 MIL/uL (ref 4.22–5.81)
RDW: 13.4 % (ref 11.5–15.5)
WBC: 6.3 10*3/uL (ref 4.0–10.5)

## 2017-08-06 LAB — HIV ANTIBODY (ROUTINE TESTING W REFLEX): HIV Screen 4th Generation wRfx: NONREACTIVE

## 2017-08-06 MED ORDER — METOPROLOL TARTRATE 25 MG PO TABS: 25.0000 mg | ORAL_TABLET | Freq: Two times a day (BID) | ORAL | 0 refills | Status: DC

## 2017-08-06 MED ORDER — PANTOPRAZOLE SODIUM 40 MG PO TBEC: 40.0000 mg | DELAYED_RELEASE_TABLET | Freq: Every day | ORAL | 0 refills | Status: DC

## 2017-08-06 MED ORDER — FENOFIBRATE 54 MG PO TABS: 54.0000 mg | ORAL_TABLET | Freq: Every day | ORAL | 0 refills | Status: DC

## 2017-08-06 NOTE — Discharge Summary (Signed)
Discharge Summary  Jonathan Shepard FAO:[id number] DOB: [birth year]  PCP: Redmond School, MD  Admit date: 08/04/2017 Discharge date: 08/06/2017  Time spent: 25 minutes  Recommendations for Outpatient Follow-up:  1. Follow up with PCP 2. Follow up with cardiology 3. Take your medications as prescribed  Discharge Diagnoses:  Active Hospital Problems   Diagnosis Date Noted  . Chest pain with moderate risk for cardiac etiology 12/07/2012  . Chest pain 08/05/2017  . S/P CABG x 4 04/22/2016  . PAD (peripheral artery disease) ([place]) 01/31/2013  . CAD (coronary artery disease) 12/07/2012  . Diabetes mellitus ([place]) 12/07/2012  . Dyslipidemia 12/07/2012    Resolved Hospital Problems  No resolved problems to display.    Discharge Condition: stable  Diet recommendation: resume previous diet  Vitals:   08/06/17 1000 08/06/17 1349  BP: 124/83 126/66  Pulse: 72 68  Resp:  20  Temp:  98.3 F (36.8 C)  SpO2:  98%    History of present illness:  Jonathan Shepard a 67 y.o.malewith medical history significant forCAD with prior CABG in 2017 who presented to the ED with sudden onset epigastric abdominal pain and lower substernal chest pain that he rated a 10 out of 10. This began immediately after he had a meal of some broiledfish, and salad. He states that he has never had an MI before, but he did have an artery occluded during a stenting procedure when he had some chest pain and states that this episode does not feel like that at all. Admitted for chest pain r/o ACS.  08/05/17:  troponin negative x 3, ekg sinus rhythm. Appears this is most likely 2/2 to GI symptoms.  RN reports loose stools x2 and nausea x1 vomiting. Will hold off cardiology consult for now.  08/06/17: no complaints. All his symptoms had resolved including nausea, abdominal cramping, loose stools, chest pain. On the day of discharge the patient was hemodynamically stable. He will need to follow up with his PCP  and cardiologist post hospitalization.    Hospital Course:  Principal Problem:   Chest pain with moderate risk for cardiac etiology Active Problems:   CAD (coronary artery disease)   Diabetes mellitus (HCC)   Dyslipidemia   PAD (peripheral artery disease) (HCC)   S/P CABG x 4   Chest pain  1. Atypical chest pain/ ACS ruled out -troponin negative x 3. EKG sinus rhythm-symptoms resolved 2. Type 2 diabetes. Stable- Maintain on heart healthy/carb modified diet. 3. CAD with prior CABG.  Continue dual antiplatelet therapy, along with statin, and beta-blocker. 4. Dyslipidemia.  Continue statin. 5. Abdominal cramping/nausea/loose stools; stool GI panel/ symptoms resolved on day of discharge. Take protonix po 40 mg daily.   Procedures:  none  Consultations:  none  Discharge Exam: BP 126/66 (BP Location: Left Arm)   Pulse 68   Temp 98.3 F (36.8 C) (Oral)   Resp 20   Ht 6' (1.829 m)   Wt 99.6 kg (219 lb 9.3 oz)   SpO2 98%   BMI 29.78 kg/m   General: 67 yo CM WD wN NAD A&O x 3 Cardiovascular: RRR no rubs or gallops  Respiratory: CTA no wheezes or rales  Discharge Instructions You were cared for by a hospitalist during your hospital stay. If you have any questions about your discharge medications or the care you received while you were in the hospital after you are discharged, you can call the unit and asked to speak with the hospitalist on call if the hospitalist  that took care of you is not available. Once you are discharged, your primary care physician will handle any further medical issues. Please note that NO REFILLS for any discharge medications will be authorized once you are discharged, as it is imperative that you return to your primary care physician (or establish a relationship with a primary care physician if you do not have one) for your aftercare needs so that they can reassess your need for medications and monitor your lab values.   Allergies as of 08/06/2017   No  Known Allergies     Medication List    STOP taking these medications   Fenofibric Acid 45 MG Cpdr   metoprolol succinate 25 MG 24 hr tablet Commonly known as:  TOPROL-XL     TAKE these medications   aspirin EC 81 MG tablet Take 81 mg by mouth every morning.   clopidogrel 75 MG tablet Commonly known as:  PLAVIX TAKE 1 TABLET DAILY (NEEDS APPOINTMENT FOR REFILLS.) What changed:  See the new instructions.   diclofenac 75 MG EC tablet Commonly known as:  VOLTAREN Take 75 mg by mouth 2 (two) times daily.   fenofibrate 54 MG tablet Take 1 tablet (54 mg total) by mouth daily. Start taking on:  08/07/2017   Fish Oil 1200 MG Caps Take 1 capsule by mouth 2 (two) times daily.   glimepiride 2 MG tablet Commonly known as:  AMARYL Take 2 mg by mouth 2 (two) times daily.   metFORMIN 1000 MG tablet Commonly known as:  GLUCOPHAGE Take 1,000 mg by mouth 2 (two) times daily with a meal.   metoprolol tartrate 25 MG tablet Commonly known as:  LOPRESSOR Take 1 tablet (25 mg total) by mouth 2 (two) times daily.   multivitamin tablet Take 1 tablet by mouth daily.   pantoprazole 40 MG tablet Commonly known as:  PROTONIX Take 1 tablet (40 mg total) by mouth daily.   pravastatin 80 MG tablet Commonly known as:  PRAVACHOL TAKE 1 TABLET EVERY DAY   ramipril 2.5 MG capsule Commonly known as:  ALTACE TAKE 1 CAPSULE EVERY DAY      No Known Allergies Follow-up Information    Redmond School, MD Follow up.   Specialty:  Internal Medicine Contact information: [address] [place] [address] [phone]            The results of significant diagnostics from this hospitalization (including imaging, microbiology, ancillary and laboratory) are listed below for reference.    Significant Diagnostic Studies: Dg Chest 2 View  Result Date: 08/04/2017 CLINICAL DATA:  Chest pain EXAM: CHEST  2 VIEW COMPARISON:  05/19/2016 FINDINGS: Post sternotomy changes. No acute  consolidation or effusion. Normal cardiomediastinal silhouette with aortic atherosclerosis. No pneumothorax. IMPRESSION: No active cardiopulmonary disease. Electronically Signed   By: Donavan Foil M.D.   On: 08/04/2017 21:33   Ct Abdomen Pelvis W Contrast  Result Date: 08/04/2017 CLINICAL DATA:  67 y/o M; severe abdominal pain over a few days moving into the chest. EXAM: CT ABDOMEN AND PELVIS WITH CONTRAST TECHNIQUE: Multidetector CT imaging of the abdomen and pelvis was performed using the standard protocol following bolus administration of intravenous contrast. CONTRAST:  168mL ISOVUE-300 IOPAMIDOL (ISOVUE-300) INJECTION 61% COMPARISON:  08/14/2004 CT abdomen and pelvis. FINDINGS: Lower chest: 12 mm juxtapleural nodule of left lower lobe, 10 mm in 2006, with linear opacity extending toward the hilum, probably tiny focus of round atelectasis. Severe coronary artery calcific atherosclerosis. Healed partially visualize median sternotomy. Hepatobiliary: No  focal liver abnormality is seen. No gallstones, gallbladder wall thickening, or biliary dilatation. Pancreas: Unremarkable. No pancreatic ductal dilatation or surrounding inflammatory changes. Spleen: Normal in size without focal abnormality. Adrenals/Urinary Tract: Adrenal glands are unremarkable. Punctate nonobstructing kidney stones bilaterally measuring up to 3 mm in the right kidney lower pole. No ureter stone or hydronephrosis. Right kidney cyst measuring up to 19 mm in the upper pole. Normal bladder. Stomach/Bowel: Stomach is within normal limits. Appendix appears normal. No evidence of bowel wall thickening, distention, or inflammatory changes. Vascular/Lymphatic: Aortic atherosclerosis. No enlarged abdominal or pelvic lymph nodes. Reproductive: Coarse central prostatic calcifications. Other: No abdominal wall hernia or abnormality. No abdominopelvic ascites. Musculoskeletal: No acute or significant osseous findings. Lumbar spondylosis with severe disc  space narrowing at L5-S1. IMPRESSION: 1. No acute process identified. 2. Severe coronary artery calcific atherosclerosis. Mild abdominal aortic calcific atherosclerosis. 3. Punctate nonobstructing kidney stones bilaterally. No ureter stone or hydronephrosis. Electronically Signed   By: Kristine Garbe M.D.   On: 08/04/2017 22:48    Microbiology: Recent Results (from the past 240 hour(s))  Gastrointestinal Panel by PCR , Stool     Status: None   Collection Time: 08/05/17  5:01 PM  Result Value Ref Range Status   Campylobacter species NOT DETECTED NOT DETECTED Final   Plesimonas shigelloides NOT DETECTED NOT DETECTED Final   Salmonella species NOT DETECTED NOT DETECTED Final   Yersinia enterocolitica NOT DETECTED NOT DETECTED Final   Vibrio species NOT DETECTED NOT DETECTED Final   Vibrio cholerae NOT DETECTED NOT DETECTED Final   Enteroaggregative E coli (EAEC) NOT DETECTED NOT DETECTED Final   Enteropathogenic E coli (EPEC) NOT DETECTED NOT DETECTED Final   Enterotoxigenic E coli (ETEC) NOT DETECTED NOT DETECTED Final   Shiga like toxin producing E coli (STEC) NOT DETECTED NOT DETECTED Final   Shigella/Enteroinvasive E coli (EIEC) NOT DETECTED NOT DETECTED Final   Cryptosporidium NOT DETECTED NOT DETECTED Final   Cyclospora cayetanensis NOT DETECTED NOT DETECTED Final   Entamoeba histolytica NOT DETECTED NOT DETECTED Final   Giardia lamblia NOT DETECTED NOT DETECTED Final   Adenovirus F40/41 NOT DETECTED NOT DETECTED Final   Astrovirus NOT DETECTED NOT DETECTED Final   Norovirus GI/GII NOT DETECTED NOT DETECTED Final   Rotavirus A NOT DETECTED NOT DETECTED Final   Sapovirus (I, II, IV, and V) NOT DETECTED NOT DETECTED Final    Comment: Performed at [facility], [address] [place], [place] [address]     Labs: Basic Metabolic Panel: Recent Labs  Lab 08/04/17 2044 08/05/17 0802  NA 141 137  K 3.8 4.1  CL 106 105  CO2 23 22  GLUCOSE 122* 199*  BUN 18 16    CREATININE 0.94 0.99  CALCIUM 9.7 9.5   Liver Function Tests: Recent Labs  Lab 08/04/17 2044  AST 55*  ALT 47  ALKPHOS 68  BILITOT 0.6  PROT 7.6  ALBUMIN 4.1   Recent Labs  Lab 08/04/17 2044  LIPASE 25   No results for input(s): AMMONIA in the last 168 hours. CBC: Recent Labs  Lab 08/04/17 2044 08/05/17 0802 08/06/17 0817  WBC 9.5 6.9 6.3  HGB 13.6 12.9* 13.8  HCT 41.7 39.1 42.4  MCV 92.3 91.6 93.0  PLT 198 166 181   Cardiac Enzymes: Recent Labs  Lab 08/04/17 2044 08/05/17 0143 08/05/17 0802 08/05/17 1328  TROPONINI <0.03 <0.03 <0.03 <0.03   BNP: BNP (last 3 results) No results for input(s): BNP in the last 8760 hours.  ProBNP (last 3 results) No results for input(s): PROBNP in the last 8760 hours.  CBG: Recent Labs  Lab 08/05/17 1645 08/05/17 2157 08/06/17 0745 08/06/17 1152 08/06/17 1702  GLUCAP 117* 132* 160* 144* 121*       Signed:  Kayleen Memos, MD [facility] 08/06/2017, 6:10 PM

## 2017-08-06 NOTE — Progress Notes (Signed)
Discharge instructions read to patient and his wife. Both verbalized understanding of all instructions.  Discharged to home with wife.  

## 2017-08-10 DIAGNOSIS — E1129 Type 2 diabetes mellitus with other diabetic kidney complication: Secondary | ICD-10-CM | POA: Diagnosis not present

## 2017-08-10 DIAGNOSIS — K219 Gastro-esophageal reflux disease without esophagitis: Secondary | ICD-10-CM | POA: Diagnosis not present

## 2017-08-10 DIAGNOSIS — I209 Angina pectoris, unspecified: Secondary | ICD-10-CM | POA: Diagnosis not present

## 2017-08-10 DIAGNOSIS — E6609 Other obesity due to excess calories: Secondary | ICD-10-CM | POA: Diagnosis not present

## 2017-08-10 DIAGNOSIS — R079 Chest pain, unspecified: Secondary | ICD-10-CM | POA: Diagnosis not present

## 2017-08-10 DIAGNOSIS — N182 Chronic kidney disease, stage 2 (mild): Secondary | ICD-10-CM | POA: Diagnosis not present

## 2017-08-10 DIAGNOSIS — B351 Tinea unguium: Secondary | ICD-10-CM | POA: Diagnosis not present

## 2017-08-10 DIAGNOSIS — I249 Acute ischemic heart disease, unspecified: Secondary | ICD-10-CM | POA: Diagnosis not present

## 2017-08-10 DIAGNOSIS — Z6831 Body mass index (BMI) 31.0-31.9, adult: Secondary | ICD-10-CM | POA: Diagnosis not present

## 2017-08-12 ENCOUNTER — Ambulatory Visit (INDEPENDENT_AMBULATORY_CARE_PROVIDER_SITE_OTHER): Payer: Medicare HMO | Admitting: Cardiology

## 2017-08-12 ENCOUNTER — Encounter: Payer: Self-pay | Admitting: Cardiology

## 2017-08-12 VITALS — BP 120/68 | HR 72 | Ht 72.0 in | Wt 219.0 lb

## 2017-08-12 DIAGNOSIS — R079 Chest pain, unspecified: Secondary | ICD-10-CM

## 2017-08-12 DIAGNOSIS — Z8249 Family history of ischemic heart disease and other diseases of the circulatory system: Secondary | ICD-10-CM | POA: Diagnosis not present

## 2017-08-12 DIAGNOSIS — E785 Hyperlipidemia, unspecified: Secondary | ICD-10-CM | POA: Diagnosis not present

## 2017-08-12 DIAGNOSIS — E119 Type 2 diabetes mellitus without complications: Secondary | ICD-10-CM

## 2017-08-12 DIAGNOSIS — Z951 Presence of aortocoronary bypass graft: Secondary | ICD-10-CM

## 2017-08-12 NOTE — Assessment & Plan Note (Signed)
LDL 74 2018- on Pravachol

## 2017-08-12 NOTE — Assessment & Plan Note (Signed)
Pt admitted 08/05/17-08/06/17 with [facility]SCP- MI r/o.

## 2017-08-12 NOTE — Patient Instructions (Signed)
Medication Instructions:  Ok to pick up rx for Protonix at pharmacy Your physician recommends that you continue on your current medications as directed. Please refer to the Current Medication list given to you today.  Labwork: None   Testing/Procedures: Your physician has requested that you have an exercise stress myoview. For further information please visit [email or web address]. Please follow instruction sheet, as given.  Follow-Up: Your physician wants you to follow-up in: 6 months with Dr Sallyanne Kuster. You will receive a reminder letter in the mail two months in advance. If you don't receive a letter, please call our office to schedule the follow-up appointment.  Any Other Special Instructions Will Be Listed Below (If Applicable).  If you need a refill on your cardiac medications before your next appointment, please call your pharmacy.

## 2017-08-12 NOTE — Assessment & Plan Note (Signed)
Pretty well controlled- no retinopathy or neuropathy, renal function normal.

## 2017-08-12 NOTE — Progress Notes (Signed)
08/12/2017 Jonathan Shepard   [birth year]  [id number]  Primary Physician Redmond School, MD Primary Cardiologist: Dr Sallyanne Kuster  HPI:  Mr Imperato is a retired 67 y/o Caucasian male with a history of fairly aggressive CAD and DM. He had a PCI in 2002 with ISR in May 2008 and Dec 2008. He ultimately had CABG x 4 in Nov 2017 for progression of his CAD. He has done well since until he presented 08/05/17 with epigastric, SSCP. He says he was out to dinner and afterwards developed abdominal pain that progressed into SSCP. He was admitted and ruled out for an MI. He is seen in the office today for follow up. He has not had further symptoms.    Current Outpatient Medications  Medication Sig Dispense Refill  . aspirin EC 81 MG tablet Take 81 mg by mouth every morning.     . clopidogrel (PLAVIX) 75 MG tablet TAKE 1 TABLET DAILY (NEEDS APPOINTMENT FOR REFILLS.) (Patient taking differently: TAKE 1 TABLET DAILY) 90 tablet 2  . diclofenac (VOLTAREN) 75 MG EC tablet Take 75 mg by mouth 2 (two) times daily.     . fenofibrate 54 MG tablet Take 1 tablet (54 mg total) by mouth daily. 30 tablet 0  . glimepiride (AMARYL) 2 MG tablet Take 2 mg by mouth 2 (two) times daily.    . metFORMIN (GLUCOPHAGE) 1000 MG tablet Take 1,000 mg by mouth 2 (two) times daily with a meal.    . metoprolol tartrate (LOPRESSOR) 25 MG tablet Take 1 tablet (25 mg total) by mouth 2 (two) times daily. 60 tablet 0  . Multiple Vitamin (MULTIVITAMIN) tablet Take 1 tablet by mouth daily.    . Omega-3 Fatty Acids (FISH OIL) 1200 MG CAPS Take 1 capsule by mouth 2 (two) times daily.     . pantoprazole (PROTONIX) 40 MG tablet Take 1 tablet (40 mg total) by mouth daily. 30 tablet 0  . pravastatin (PRAVACHOL) 80 MG tablet TAKE 1 TABLET EVERY DAY 90 tablet 2  . ramipril (ALTACE) 2.5 MG capsule TAKE 1 CAPSULE EVERY DAY 90 capsule 2   No current facility-administered medications for this visit.     No Known Allergies  Past Medical History:    Diagnosis Date  . CAD (coronary artery disease)   . Diabetes ([place])   . Dyspnea   . Hyperlipidemia   . Hypertension   . Obesity     Social History   Socioeconomic History  . Marital status: Married    Spouse name: Not on file  . Number of children: Not on file  . Years of education: Not on file  . Highest education level: Not on file  Social Needs  . Financial resource strain: Not on file  . Food insecurity - worry: Not on file  . Food insecurity - inability: Not on file  . Transportation needs - medical: Not on file  . Transportation needs - non-medical: Not on file  Occupational History  . Not on file  Tobacco Use  . Smoking status: Never Smoker  . Smokeless tobacco: Never Used  Substance and Sexual Activity  . Alcohol use: No  . Drug use: No  . Sexual activity: Not on file  Other Topics Concern  . Not on file  Social History Narrative   Has 3 children   Has 2 grandchildren     Family History  Problem Relation Age of Onset  . Coronary artery disease Father 22  CABG  . Coronary artery disease Brother 80       CABG     Review of Systems: General: negative for chills, fever, night sweats or weight changes.  Cardiovascular: negative for chest pain, dyspnea on exertion, edema, orthopnea, palpitations, paroxysmal nocturnal dyspnea or shortness of breath Dermatological: negative for rash Respiratory: negative for cough or wheezing Urologic: negative for hematuria Abdominal: negative for nausea, vomiting, diarrhea, bright red blood per rectum, melena, or hematemesis Neurologic: negative for visual changes, syncope, or dizziness All other systems reviewed and are otherwise negative except as noted above.    Blood pressure 120/68, pulse 72, height 6' (1.829 m), weight 219 lb (99.3 kg).  General appearance: alert, cooperative, no distress and mildly obese Neck: no carotid bruit and no JVD Lungs: clear to auscultation bilaterally Heart: regular rate and  rhythm Extremities: extremities normal, atraumatic, no cyanosis or edema Skin: Skin color, texture, turgor normal. No rashes or lesions Neurologic: Grossly normal  EKG NSR, inferior Qs  ASSESSMENT AND PLAN:   Chest pain with moderate risk for cardiac etiology Pt admitted 08/05/17-08/06/17 with SSCP- MI r/o.  Non-insulin dependent type 2 diabetes mellitus ([place]) Pretty well controlled- no retinopathy or neuropathy, renal function normal.   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 He has recently had chest pain which is new for him. He wants to start an exercise program this spring to try and loose weight. I'm going to recommend an exercise Myoview. I also suggested he take a PPI while he is on BID Voltaren. F/U with Dr Sallyanne Kuster in 6 months or sooner depending on his Myoview results.   Kerin Ransom PA-C 08/12/2017 11:38 AM

## 2017-08-12 NOTE — Progress Notes (Signed)
OK. Thanks MCr

## 2017-08-12 NOTE — Assessment & Plan Note (Signed)
CABG in Nov 2017 x 4 after multiple PCI's going back to 2002 L-LAD, LRA-OM, SVG-DX1, SVG-PDA

## 2017-08-14 IMAGING — CR DG CHEST 2V
2 series · 2 of 2 positions shown · non-contrast
Comparison: 09/21/2006

CLINICAL DATA: Preadmission respiratory exam for CABG. Wheezing.
Chest discomfort with dull pain running down both arms at times x
one year. Hx HTN, CAD, PAD, angioplasty with 6 stents placed in '02
and '08. Multiple broken ribs yrs ago.

EXAM:
CHEST  2 VIEW

[w chest pa]
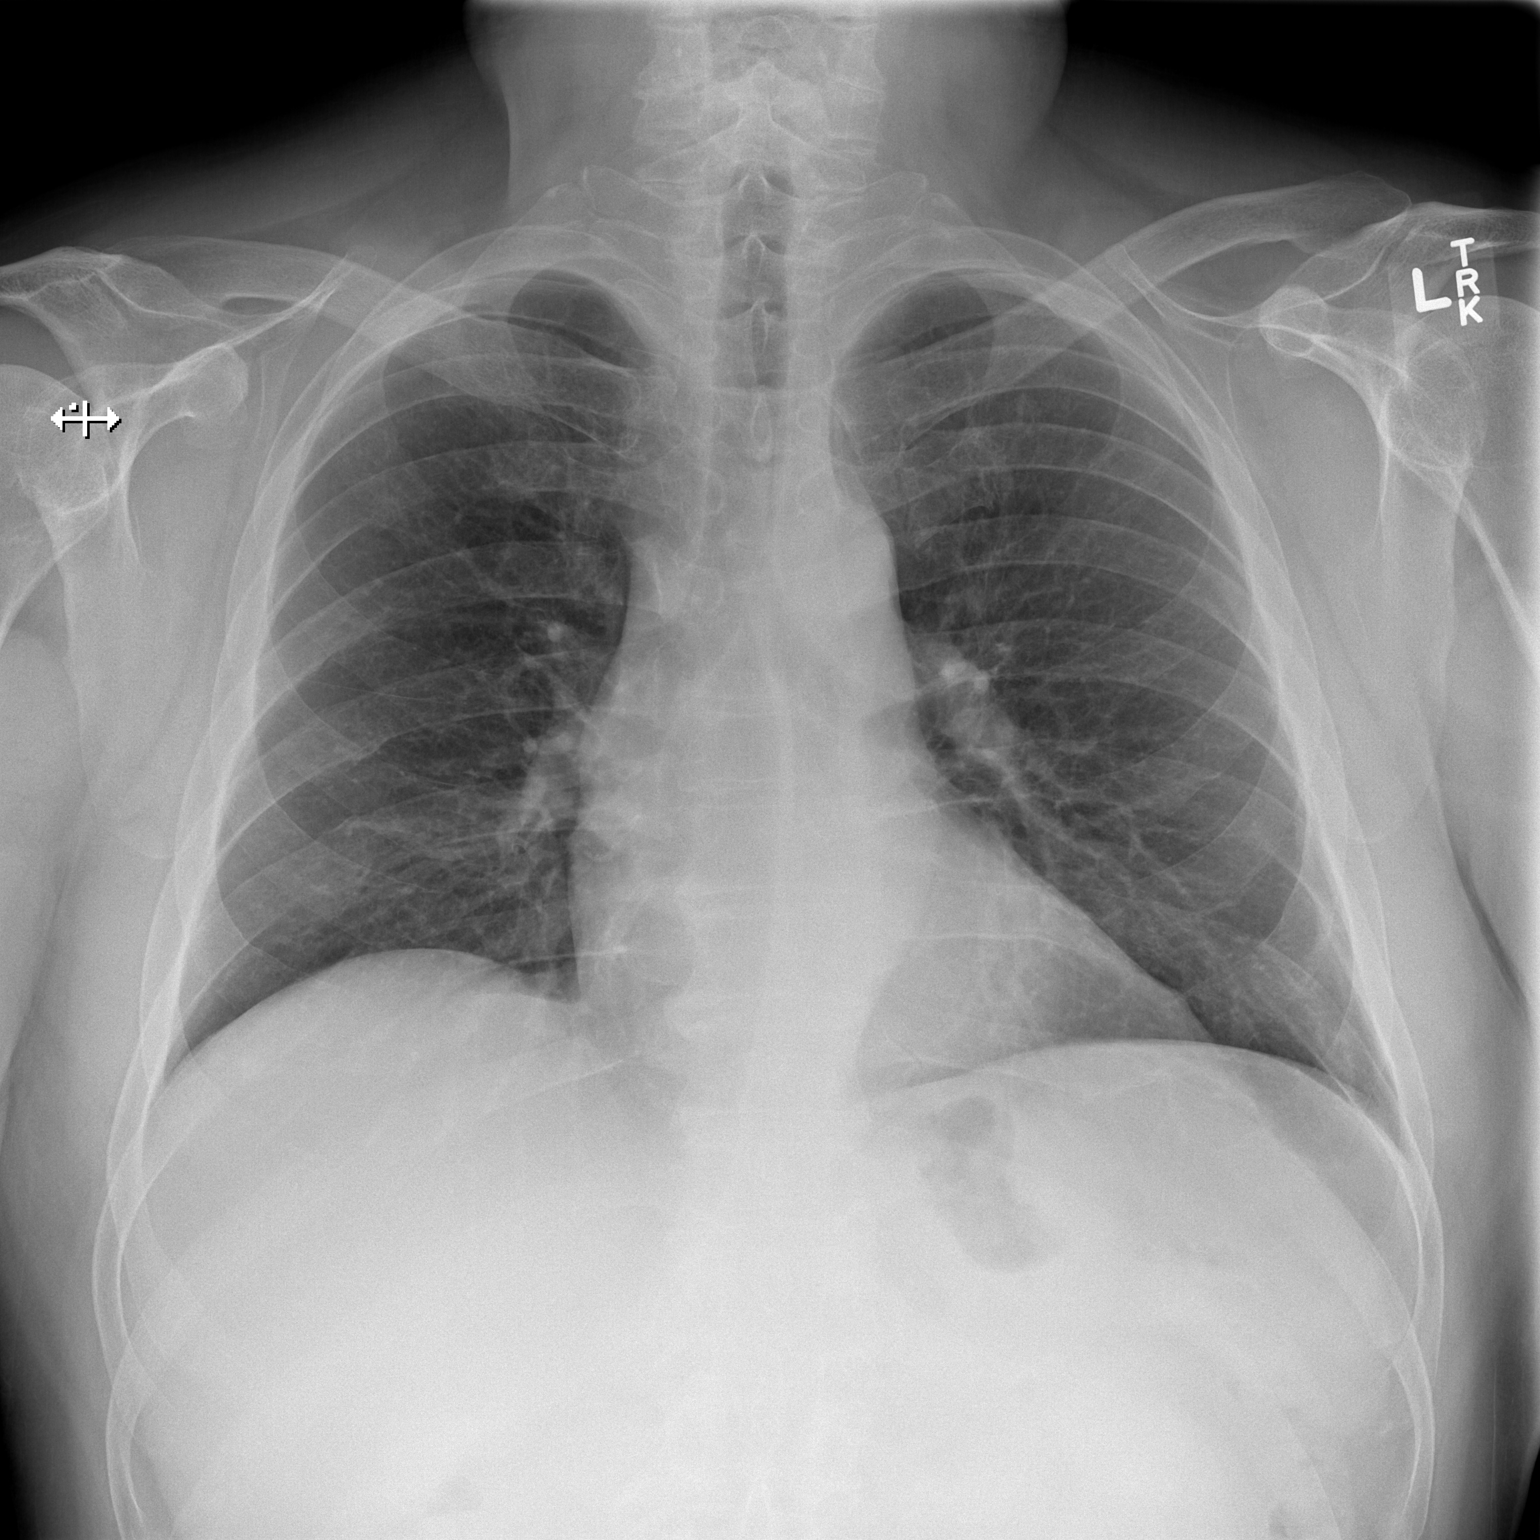

[w chest lat]
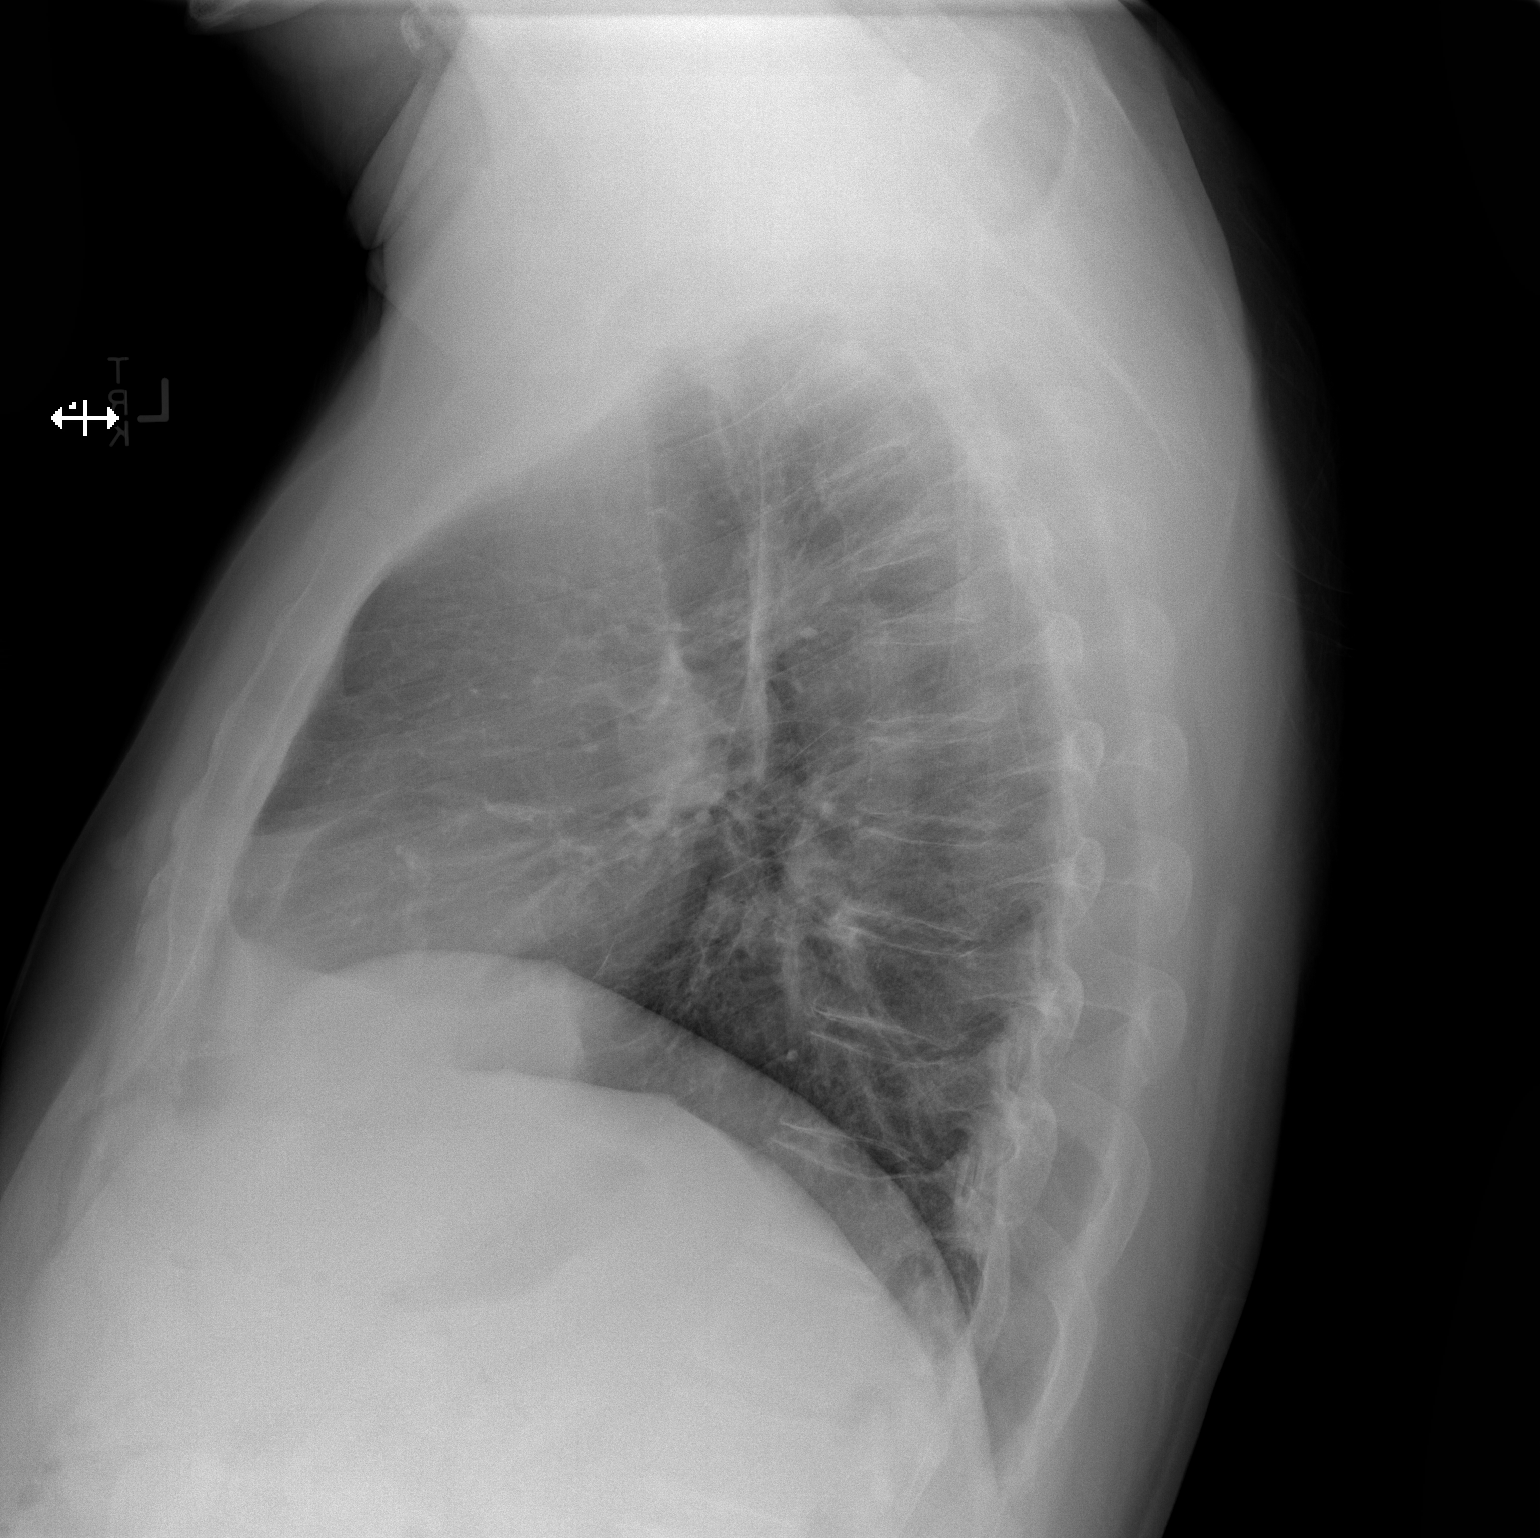

[2 of 2 positions shown; findings below may reference images not displayed]

FINDINGS: The heart size and mediastinal contours are within normal limits.

Clear lungs.  No pleural effusion or pneumothorax.

Mild wedge-shaped compression deformity of a mid to upper thoracic
vertebra without convincing change from the prior study. Skeletal
structures are demineralized.
IMPRESSION: No acute cardiopulmonary disease.

## 2017-08-16 ENCOUNTER — Other Ambulatory Visit: Payer: Self-pay

## 2017-08-16 DIAGNOSIS — R079 Chest pain, unspecified: Secondary | ICD-10-CM

## 2017-08-16 IMAGING — DX DG CHEST 1V PORT
1 series · 1 of 1 positions shown · non-contrast
Comparison: 04/20/2016

CLINICAL DATA: Postop heart search

EXAM:
PORTABLE CHEST 1 VIEW

[chest ap]
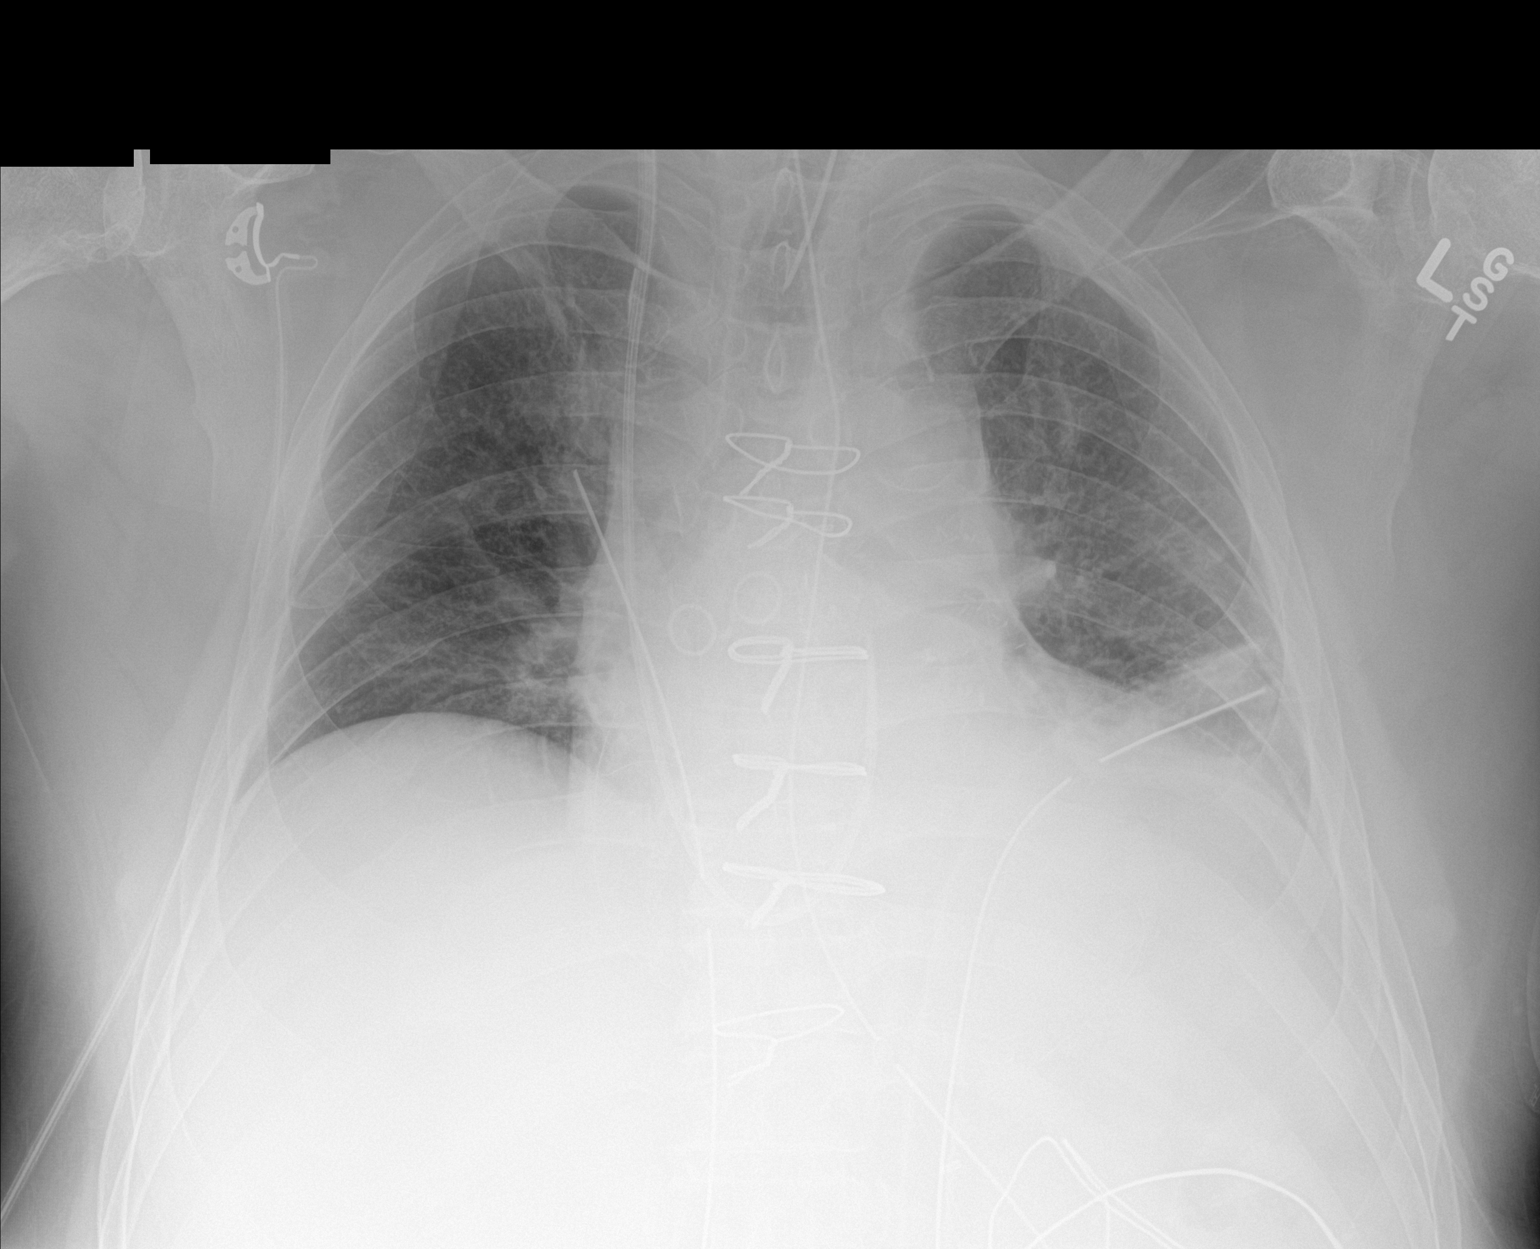

[1 of 1 positions shown; findings below may reference images not displayed]

FINDINGS: The endotracheal tube is 5.3 cm above the carina. Bilateral chest
tube/mediastinal drain present. Right jugular Swan-Ganz catheter
with tip at the level of the pulmonic root. Nasogastric tube extends
into the stomach and beyond the inferior edge of the image.

No pneumothorax. Mild left base atelectatic appearing opacities. No
significant effusion. Normal pulmonary vasculature.
IMPRESSION: Support equipment appears satisfactorily positioned. No
pneumothorax. Mild atelectatic appearing left base opacities.

## 2017-08-17 DIAGNOSIS — H5200 Hypermetropia, unspecified eye: Secondary | ICD-10-CM | POA: Diagnosis not present

## 2017-08-19 ENCOUNTER — Encounter (HOSPITAL_COMMUNITY): Payer: Medicare HMO

## 2017-08-25 ENCOUNTER — Encounter (HOSPITAL_COMMUNITY)
Admission: RE | Admit: 2017-08-25 | Discharge: 2017-08-25 | Disposition: A | Discharge status: Home / Self Care | Payer: Medicare HMO | Source: Ambulatory Visit | Attending: Cardiology | Admitting: Cardiology

## 2017-08-25 ENCOUNTER — Encounter (HOSPITAL_BASED_OUTPATIENT_CLINIC_OR_DEPARTMENT_OTHER)
Admission: RE | Admit: 2017-08-25 | Discharge: 2017-08-25 | Disposition: A | Discharge status: Home / Self Care | Payer: Medicare HMO | Source: Ambulatory Visit | Attending: Cardiology | Admitting: Cardiology

## 2017-08-25 ENCOUNTER — Encounter (HOSPITAL_COMMUNITY): Payer: Self-pay

## 2017-08-25 DIAGNOSIS — R079 Chest pain, unspecified: Secondary | ICD-10-CM

## 2017-08-25 LAB — NM MYOCAR MULTI W/SPECT W/WALL MOTION / EF
Estimated workload: 10.1 METS
Exercise duration (min): 9 min
Exercise duration (sec): 15 s
LV dias vol: 88 mL (ref 62–150)
LV sys vol: 39 mL
MPHR: 156 {beats}/min
Peak HR: 141 {beats}/min
Percent HR: 90 %
RATE: 0.43
RPE: 15
Rest HR: 75 {beats}/min
SDS: 0
SRS: 1
SSS: 1
TID: 1

## 2017-08-25 MED ORDER — TECHNETIUM TC 99M TETROFOSMIN IV KIT
10.0000 | PACK | Freq: Once | INTRAVENOUS | Status: AC | PRN
  Administered 2017-08-25: 11 via INTRAVENOUS

## 2017-08-25 MED ORDER — REGADENOSON 0.4 MG/5ML IV SOLN
INTRAVENOUS | Status: AC
  Filled 2017-08-25: qty 5

## 2017-08-25 MED ORDER — TECHNETIUM TC 99M TETROFOSMIN IV KIT
30.0000 | PACK | Freq: Once | INTRAVENOUS | Status: AC | PRN
  Administered 2017-08-25: 32 via INTRAVENOUS

## 2017-08-25 MED ORDER — SODIUM CHLORIDE 0.9% FLUSH
INTRAVENOUS | Status: AC
  Administered 2017-08-25: 10 mL via INTRAVENOUS
  Filled 2017-08-25: qty 10

## 2017-08-27 ENCOUNTER — Other Ambulatory Visit: Payer: Self-pay

## 2017-08-27 MED ORDER — FENOFIBRATE 54 MG PO TABS: 54.0000 mg | ORAL_TABLET | Freq: Every day | ORAL | 2 refills | Status: DC

## 2017-08-27 MED ORDER — RAMIPRIL 5 MG PO CAPS: 5.0000 mg | ORAL_CAPSULE | Freq: Every day | ORAL | 2 refills | Status: DC

## 2017-08-27 MED ORDER — METOPROLOL TARTRATE 25 MG PO TABS: 25.0000 mg | ORAL_TABLET | Freq: Two times a day (BID) | ORAL | 2 refills | Status: DC

## 2017-08-27 MED ORDER — PANTOPRAZOLE SODIUM 40 MG PO TBEC: 40.0000 mg | DELAYED_RELEASE_TABLET | Freq: Every day | ORAL | 2 refills | Status: DC

## 2017-09-13 DIAGNOSIS — J09X2 Influenza due to identified novel influenza A virus with other respiratory manifestations: Secondary | ICD-10-CM | POA: Diagnosis not present

## 2017-10-04 ENCOUNTER — Other Ambulatory Visit: Payer: Self-pay | Admitting: Cardiovascular Disease

## 2017-10-04 NOTE — Telephone Encounter (Signed)
REFILL 

## 2017-10-28 DIAGNOSIS — W460XXA Contact with hypodermic needle, initial encounter: Secondary | ICD-10-CM | POA: Diagnosis not present

## 2017-10-28 DIAGNOSIS — E6609 Other obesity due to excess calories: Secondary | ICD-10-CM | POA: Diagnosis not present

## 2017-10-28 DIAGNOSIS — S61237A Puncture wound without foreign body of left little finger without damage to nail, initial encounter: Secondary | ICD-10-CM | POA: Diagnosis not present

## 2017-10-28 DIAGNOSIS — Z6830 Body mass index (BMI) 30.0-30.9, adult: Secondary | ICD-10-CM | POA: Diagnosis not present

## 2017-10-28 DIAGNOSIS — Z1389 Encounter for screening for other disorder: Secondary | ICD-10-CM | POA: Diagnosis not present

## 2017-10-28 DIAGNOSIS — Z23 Encounter for immunization: Secondary | ICD-10-CM | POA: Diagnosis not present

## 2017-12-21 ENCOUNTER — Ambulatory Visit: Payer: 59 | Admitting: General Surgery

## 2018-01-05 DIAGNOSIS — B353 Tinea pedis: Secondary | ICD-10-CM | POA: Diagnosis not present

## 2018-01-05 DIAGNOSIS — B352 Tinea manuum: Secondary | ICD-10-CM | POA: Diagnosis not present

## 2018-01-05 DIAGNOSIS — B351 Tinea unguium: Secondary | ICD-10-CM | POA: Diagnosis not present

## 2018-01-05 DIAGNOSIS — L089 Local infection of the skin and subcutaneous tissue, unspecified: Secondary | ICD-10-CM | POA: Diagnosis not present

## 2018-01-06 ENCOUNTER — Ambulatory Visit: Payer: 59 | Admitting: General Surgery

## 2018-01-07 DIAGNOSIS — M1991 Primary osteoarthritis, unspecified site: Secondary | ICD-10-CM | POA: Diagnosis not present

## 2018-01-07 DIAGNOSIS — E669 Obesity, unspecified: Secondary | ICD-10-CM | POA: Diagnosis not present

## 2018-01-07 DIAGNOSIS — Z6830 Body mass index (BMI) 30.0-30.9, adult: Secondary | ICD-10-CM | POA: Diagnosis not present

## 2018-01-07 DIAGNOSIS — G894 Chronic pain syndrome: Secondary | ICD-10-CM | POA: Diagnosis not present

## 2018-01-07 DIAGNOSIS — N182 Chronic kidney disease, stage 2 (mild): Secondary | ICD-10-CM | POA: Diagnosis not present

## 2018-01-07 DIAGNOSIS — Z1389 Encounter for screening for other disorder: Secondary | ICD-10-CM | POA: Diagnosis not present

## 2018-01-07 DIAGNOSIS — Z0001 Encounter for general adult medical examination with abnormal findings: Secondary | ICD-10-CM | POA: Diagnosis not present

## 2018-01-07 DIAGNOSIS — E1129 Type 2 diabetes mellitus with other diabetic kidney complication: Secondary | ICD-10-CM | POA: Diagnosis not present

## 2018-01-07 DIAGNOSIS — D649 Anemia, unspecified: Secondary | ICD-10-CM | POA: Diagnosis not present

## 2018-01-24 DIAGNOSIS — E6609 Other obesity due to excess calories: Secondary | ICD-10-CM | POA: Diagnosis not present

## 2018-01-24 DIAGNOSIS — Z1389 Encounter for screening for other disorder: Secondary | ICD-10-CM | POA: Diagnosis not present

## 2018-01-24 DIAGNOSIS — Z0001 Encounter for general adult medical examination with abnormal findings: Secondary | ICD-10-CM | POA: Diagnosis not present

## 2018-01-24 DIAGNOSIS — Z0000 Encounter for general adult medical examination without abnormal findings: Secondary | ICD-10-CM | POA: Diagnosis not present

## 2018-01-24 DIAGNOSIS — D649 Anemia, unspecified: Secondary | ICD-10-CM | POA: Diagnosis not present

## 2018-01-24 DIAGNOSIS — Z6830 Body mass index (BMI) 30.0-30.9, adult: Secondary | ICD-10-CM | POA: Diagnosis not present

## 2018-01-24 DIAGNOSIS — N182 Chronic kidney disease, stage 2 (mild): Secondary | ICD-10-CM | POA: Diagnosis not present

## 2018-01-24 DIAGNOSIS — E782 Mixed hyperlipidemia: Secondary | ICD-10-CM | POA: Diagnosis not present

## 2018-03-22 ENCOUNTER — Ambulatory Visit (INDEPENDENT_AMBULATORY_CARE_PROVIDER_SITE_OTHER): Payer: Self-pay

## 2018-03-22 DIAGNOSIS — Z1211 Encounter for screening for malignant neoplasm of colon: Secondary | ICD-10-CM

## 2018-03-22 MED ORDER — NA SULFATE-K SULFATE-MG SULF 17.5-3.13-1.6 GM/177ML PO SOLN: 1.0000 | ORAL | 0 refills | Status: DC

## 2018-03-22 NOTE — Progress Notes (Signed)
Gastroenterology Pre-Procedure Review  Request Date:03/22/18 Requesting Physician: Dr.Fusco- last tcs RMR- 2002- could not pull up report in epic  PATIENT REVIEW QUESTIONS: The patient responded to the following health history questions as indicated:    1. Diabetes Melitis: yes (glimepiride bid and metformin bid) 2. Joint replacements in the past 12 months: no 3. Major health problems in the past 3 months: no 4. Has an artificial valve or MVP: no 5. Has a defibrillator: no 6. Has been advised in past to take antibiotics in advance of a procedure like teeth cleaning: no 7. Family history of colon cancer: no  8. Alcohol Use: no 9. History of sleep apnea: no  10. History of coronary artery or other vascular stents placed within the last 12 months: no 11. History of any prior anesthesia complications: no    MEDICATIONS & ALLERGIES:    Patient reports the following regarding taking any blood thinners:   Plavix? yes (75mg ) Aspirin? yes (81mg ) Coumadin? no Brilinta? no Xarelto? no Eliquis? no Pradaxa? no Savaysa? no Effient? no  Patient confirms/reports the following medications:  Current Outpatient Medications  Medication Sig Dispense Refill  . aspirin EC 81 MG tablet Take 81 mg by mouth every morning.     . clopidogrel (PLAVIX) 75 MG tablet TAKE 1 TABLET DAILY (NEEDS APPOINTMENT FOR REFILLS.) 90 tablet 1  . co-enzyme Q-10 50 MG capsule Take 50 mg by mouth daily.    . diclofenac (VOLTAREN) 75 MG EC tablet Take 75 mg by mouth 2 (two) times daily.     . fenofibrate 54 MG tablet Take 1 tablet (54 mg total) by mouth daily. 90 tablet 2  . glimepiride (AMARYL) 2 MG tablet Take 2 mg by mouth 2 (two) times daily.    Marland Kitchen HYDROcodone-acetaminophen (NORCO) 10-325 MG tablet as needed.  0  . metFORMIN (GLUCOPHAGE) 1000 MG tablet Take 1,000 mg by mouth 2 (two) times daily with a meal.    . metoprolol tartrate (LOPRESSOR) 25 MG tablet Take 1 tablet (25 mg total) by mouth 2 (two) times daily. 180  tablet 2  . Multiple Vitamin (MULTIVITAMIN) tablet Take 1 tablet by mouth daily.    . Omega-3 Fatty Acids (FISH OIL) 1200 MG CAPS Take 1 capsule by mouth 2 (two) times daily.     . pantoprazole (PROTONIX) 40 MG tablet Take 1 tablet (40 mg total) by mouth daily. 90 tablet 2  . pravastatin (PRAVACHOL) 80 MG tablet TAKE 1 TABLET EVERY DAY 90 tablet 1  . ramipril (ALTACE) 5 MG capsule Take 1 capsule (5 mg total) by mouth daily. 90 capsule 2   No current facility-administered medications for this visit.     Patient confirms/reports the following allergies:  No Known Allergies  No orders of the defined types were placed in this encounter.   AUTHORIZATION INFORMATION Primary Insurance: [facility],  [place] #: [id number] Pre-Cert / Josem Kaufmann required: Pre-Cert / Auth #:    SCHEDULE INFORMATION: Procedure has been scheduled as follows:  Date: 05/18/18, Time: 10:30  Location: [facility] Dr.Rourk  This Gastroenterology Pre-Precedure Review Form is being routed to the following provider(s): Neil Crouch, PA

## 2018-03-22 NOTE — Patient Instructions (Signed)
Jonathan Shepard  [birth year] MRN: [id number]     Procedure Date: 05/18/18 Time to register: 9:30am Place to register: [facility] Procedure Time: 10:30am Scheduled provider: R. Garfield Cornea, MD    PREPARATION FOR COLONOSCOPY WITH SUPREP BOWEL PREP KIT  Note: Suprep Bowel Prep Kit is a split-dose (2day) regimen. Consumption of BOTH 6-ounce bottles is required for a complete prep.  Please notify us immediately if you are diabetic, take iron supplements, or if you are on Coumadin or any other blood thinners.  Please hold the following medications: I will mail you a letter with this information.                                                                                                                                                  2 DAYS BEFORE PROCEDURE:  DATE: 05/16/18   DAY: Monday Begin clear liquid diet AFTER your lunch meal. NO SOLID FOODS after this point.  1 DAY BEFORE PROCEDURE:  DATE: 05/17/18  DAY: Tuesday Continue clear liquids the entire day - NO SOLID FOOD.   Diabetic medications adjustments for today: see letter  At 6:00pm: Complete steps 1 through 4 below, using ONE (1) 6-ounce bottle, before going to bed. Step 1:  Pour ONE (1) 6-ounce bottle of SUPREP liquid into the mixing container.  Step 2:  Add cool drinking water to the 16 ounce line on the container and mix.  Note: Dilute the solution concentrate as directed prior to use. Step 3:  DRINK ALL the liquid in the container. Step 4:  You MUST drink an additional two (2) or more 16 ounce containers of water over the next one (1) hour.   Continue clear liquids.  DAY OF PROCEDURE:   DATE: 05/18/18   DAY: Wednesday If you take medications for your heart, blood pressure, or breathing, you may take these medications.  Diabetic medications adjustments for today:see letter  5 hours before your procedure at :5:30am Step 1:  Pour ONE (1) 6-ounce bottle of SUPREP liquid into the mixing container.  Step  2:  Add cool drinking water to the 16 ounce line on the container and mix.  Note: Dilute the solution concentrate as directed prior to use. Step 3:  DRINK ALL the liquid in the container. Step 4:  You MUST drink an additional two (2) or more 16 ounce containers of water over the next one (1) hour. You MUST complete the final glass of water at least 3 hours before your colonoscopy.   Nothing by mouth past 7:30am  You may take your morning medications with sip of water unless we have instructed otherwise.    Please see below for Dietary Information.  CLEAR LIQUIDS INCLUDE:  Water Jello (NOT red in color)   Ice Popsicles (NOT red in color)   Tea (sugar ok, no milk/cream) Powdered  fruit flavored drinks  Coffee (sugar ok, no milk/cream) Gatorade/ Lemonade/ Kool-Aid  (NOT red in color)   Juice: apple, white grape, white cranberry Soft drinks  Clear bullion, consomme, broth (fat free beef/chicken/vegetable)  Carbonated beverages (any kind)  Strained chicken noodle soup Hard Candy   Remember: Clear liquids are liquids that will allow you to see your fingers on the other side of a clear glass. Be sure liquids are NOT red in color, and not cloudy, but CLEAR.  DO NOT EAT OR DRINK ANY OF THE FOLLOWING:  Dairy products of any kind   Cranberry juice Tomato juice / V8 juice   Grapefruit juice Orange juice     Red grape juice  Do not eat any solid foods, including such foods as: cereal, oatmeal, yogurt, fruits, vegetables, creamed soups, eggs, bread, crackers, pureed foods in a blender, etc.   HELPFUL HINTS FOR DRINKING PREP SOLUTION:   Make sure prep is extremely cold. Mix and refrigerate the the morning of the prep. You may also put in the freezer.   You may try mixing some Crystal Light or Country Time Lemonade if you prefer. Mix in small amounts; add more if necessary.  Try drinking through a straw  Rinse mouth with water or a mouthwash between glasses, to remove after-taste.  Try  sipping on a cold beverage /ice/ popsicles between glasses of prep.  Place a piece of sugar-free hard candy in mouth between glasses.  If you become nauseated, try consuming smaller amounts, or stretch out the time between glasses. Stop for 30-60 minutes, then slowly start back drinking.     OTHER INSTRUCTIONS  You will need a responsible adult at least 65 years of age to accompany you and drive you home. This person must remain in the waiting room during your procedure. The hospital will cancel your procedure if you do not have a responsible adult with you.   1. Wear loose fitting clothing that is easily removed. 2. Leave jewelry and other valuables at home.  3. Remove all body piercing jewelry and leave at home. 4. Total time from sign-in until discharge is approximately 2-3 hours. 5. You should go home directly after your procedure and rest. You can resume normal activities the day after your procedure. 6. The day of your procedure you should not:  Drive  Make legal decisions  Operate machinery  Drink alcohol  Return to work   You may call the office (Dept: [phone]) before 5:00pm, or page the doctor on call [phone]) after 5:00pm, for further instructions, if necessary.   Insurance Information YOU WILL NEED TO CHECK WITH YOUR INSURANCE COMPANY FOR THE BENEFITS OF COVERAGE YOU HAVE FOR THIS PROCEDURE.  UNFORTUNATELY, NOT ALL INSURANCE COMPANIES HAVE BENEFITS TO COVER ALL OR PART OF THESE TYPES OF PROCEDURES.  IT IS YOUR RESPONSIBILITY TO CHECK YOUR BENEFITS, HOWEVER, WE WILL BE GLAD TO ASSIST YOU WITH ANY CODES YOUR INSURANCE COMPANY MAY NEED.    PLEASE NOTE THAT MOST INSURANCE COMPANIES WILL NOT COVER A SCREENING COLONOSCOPY FOR PEOPLE UNDER THE AGE OF 50  IF YOU HAVE BCBS INSURANCE, YOU MAY HAVE BENEFITS FOR A SCREENING COLONOSCOPY BUT IF POLYPS ARE FOUND THE DIAGNOSIS WILL CHANGE AND THEN YOU MAY HAVE A DEDUCTIBLE THAT WILL NEED TO BE MET. SO PLEASE MAKE SURE YOU  CHECK YOUR BENEFITS FOR A SCREENING COLONOSCOPY AS WELL AS A DIAGNOSTIC COLONOSCOPY.

## 2018-03-31 NOTE — Progress Notes (Addendum)
Patient receives 120 hydrocodone and 30 oxycodone every month for past two months and several times throughout the year based on [place] controlled substance database.   Needs propofol.

## 2018-04-01 NOTE — Progress Notes (Signed)
Jonathan Shepard, please schedule ov and I will mail him a letter.

## 2018-04-01 NOTE — Progress Notes (Signed)
Pt has been scheduled an ov for 04/26/18 with AB. I have mailed the pt a letter with his appt date and time. Called endo and spoke with Maudie Mercury and cancelled his procedure. Took pt off the schedule.

## 2018-04-07 DIAGNOSIS — B353 Tinea pedis: Secondary | ICD-10-CM | POA: Diagnosis not present

## 2018-04-07 DIAGNOSIS — Z23 Encounter for immunization: Secondary | ICD-10-CM | POA: Diagnosis not present

## 2018-04-07 DIAGNOSIS — B351 Tinea unguium: Secondary | ICD-10-CM | POA: Diagnosis not present

## 2018-04-13 ENCOUNTER — Telehealth: Payer: Self-pay | Admitting: Cardiology

## 2018-04-13 ENCOUNTER — Other Ambulatory Visit: Payer: Self-pay | Admitting: Cardiovascular Disease

## 2018-04-13 NOTE — Telephone Encounter (Signed)
Received call from patient, he is at the dentist in the chair for a simple, single tooth extraction and dentist wanted to make sure he didn't need to hold Plavix.  Advised per protocol, do not hold Plavix for single extraction.   Verbalized understanding.

## 2018-04-13 NOTE — Telephone Encounter (Signed)
New Message   Pt calling states he is at the Dentist office Dr. Lear Ng, and they are wanting to do a tooth extraction but pt is on plavix

## 2018-04-15 NOTE — Telephone Encounter (Signed)
Rx request sent to pharmacy.  

## 2018-04-18 DIAGNOSIS — E1129 Type 2 diabetes mellitus with other diabetic kidney complication: Secondary | ICD-10-CM | POA: Diagnosis not present

## 2018-04-18 DIAGNOSIS — L719 Rosacea, unspecified: Secondary | ICD-10-CM | POA: Diagnosis not present

## 2018-04-18 DIAGNOSIS — Z1389 Encounter for screening for other disorder: Secondary | ICD-10-CM | POA: Diagnosis not present

## 2018-04-18 DIAGNOSIS — Z6830 Body mass index (BMI) 30.0-30.9, adult: Secondary | ICD-10-CM | POA: Diagnosis not present

## 2018-04-18 DIAGNOSIS — L0102 Bockhart's impetigo: Secondary | ICD-10-CM | POA: Diagnosis not present

## 2018-04-26 ENCOUNTER — Ambulatory Visit: Payer: Medicare HMO | Admitting: Gastroenterology

## 2018-05-01 DIAGNOSIS — E119 Type 2 diabetes mellitus without complications: Secondary | ICD-10-CM | POA: Diagnosis not present

## 2018-05-08 ENCOUNTER — Other Ambulatory Visit: Payer: Self-pay | Admitting: Cardiology

## 2018-05-08 ENCOUNTER — Other Ambulatory Visit: Payer: Self-pay | Admitting: Cardiovascular Disease

## 2018-05-18 ENCOUNTER — Encounter (HOSPITAL_COMMUNITY): Payer: Self-pay

## 2018-05-18 ENCOUNTER — Ambulatory Visit (HOSPITAL_COMMUNITY): Admit: 2018-05-18 | Payer: Medicare HMO | Admitting: Internal Medicine

## 2018-05-18 SURGERY — COLONOSCOPY
Anesthesia: Moderate Sedation

## 2018-06-15 DIAGNOSIS — Z6830 Body mass index (BMI) 30.0-30.9, adult: Secondary | ICD-10-CM | POA: Diagnosis not present

## 2018-06-15 DIAGNOSIS — E1129 Type 2 diabetes mellitus with other diabetic kidney complication: Secondary | ICD-10-CM | POA: Diagnosis not present

## 2018-06-15 DIAGNOSIS — M1991 Primary osteoarthritis, unspecified site: Secondary | ICD-10-CM | POA: Diagnosis not present

## 2018-06-15 DIAGNOSIS — J329 Chronic sinusitis, unspecified: Secondary | ICD-10-CM | POA: Diagnosis not present

## 2018-06-15 DIAGNOSIS — I739 Peripheral vascular disease, unspecified: Secondary | ICD-10-CM | POA: Diagnosis not present

## 2018-06-15 DIAGNOSIS — E669 Obesity, unspecified: Secondary | ICD-10-CM | POA: Diagnosis not present

## 2018-06-15 DIAGNOSIS — E119 Type 2 diabetes mellitus without complications: Secondary | ICD-10-CM | POA: Diagnosis not present

## 2018-06-15 DIAGNOSIS — I10 Essential (primary) hypertension: Secondary | ICD-10-CM | POA: Diagnosis not present

## 2018-06-15 DIAGNOSIS — I209 Angina pectoris, unspecified: Secondary | ICD-10-CM | POA: Diagnosis not present

## 2018-06-22 DIAGNOSIS — Z23 Encounter for immunization: Secondary | ICD-10-CM | POA: Diagnosis not present

## 2018-06-22 DIAGNOSIS — R238 Other skin changes: Secondary | ICD-10-CM | POA: Diagnosis not present

## 2018-06-22 DIAGNOSIS — L821 Other seborrheic keratosis: Secondary | ICD-10-CM | POA: Diagnosis not present

## 2018-07-21 ENCOUNTER — Encounter: Payer: Self-pay | Admitting: Gastroenterology

## 2018-07-21 ENCOUNTER — Ambulatory Visit (INDEPENDENT_AMBULATORY_CARE_PROVIDER_SITE_OTHER): Payer: Medicare HMO | Admitting: Gastroenterology

## 2018-07-21 ENCOUNTER — Encounter: Payer: Self-pay | Admitting: *Deleted

## 2018-07-21 ENCOUNTER — Telehealth: Payer: Self-pay | Admitting: *Deleted

## 2018-07-21 ENCOUNTER — Other Ambulatory Visit: Payer: Self-pay | Admitting: *Deleted

## 2018-07-21 DIAGNOSIS — Z1211 Encounter for screening for malignant neoplasm of colon: Secondary | ICD-10-CM

## 2018-07-21 NOTE — Assessment & Plan Note (Signed)
Very pleasant 68 year old male with need for routine screening colonoscopy, as last was in remote past (2002). Unknown results from that time. No concerning lower or upper GI signs/symptoms, and he has no family history of polyps or colorectal cancer.   Proceed with TCS with Dr. Gala Romney in near future: the risks, benefits, and alternatives have been discussed with the patient in detail. The patient states understanding and desires to proceed. Propofol due to need for chronic narcotics in setting of chronic pain

## 2018-07-21 NOTE — Progress Notes (Signed)
cc'ed to pcp °

## 2018-07-21 NOTE — Telephone Encounter (Signed)
Pre-op scheduled for 4/7 at 11:00am. Patient aware. Letter mailed.

## 2018-07-21 NOTE — Progress Notes (Signed)
Primary Care Physician:  Redmond School, MD Primary Gastroenterologist:  Dr. Gala Romney   Chief Complaint  Patient presents with  . Colonoscopy    HPI:   Jonathan Shepard is a 68 y.o. male presenting today at the request of Dr. Gerarda Fraction to arrange screening colonoscopy. His last was in 2002 but not available in [place]. He was brought in today due to need for Propofol.  He denies abdominal pain, change in bowel habits. His stool is on the "looser side" while on metformin. No rectal bleeding. GERD controlled with PPI. No dysphagia. No concerning upper or lower GI signs/symptoms. He takes approximately one hydrocodone daily. Oxycodone rarely for severe pain.     Past Medical History:  Diagnosis Date  . CAD (coronary artery disease)   . Diabetes ([place])   . Dyspnea   . GERD (gastroesophageal reflux disease)   . Hyperlipidemia   . Hypertension   . Joint pain    hand, related to work  . Obesity     Past Surgical History:  Procedure Laterality Date  . CARDIAC CATHETERIZATION N/A 04/14/2016   Procedure: Left Heart Cath and Coronary Angiography;  Surgeon: Peter M Martinique, MD;  Location: [facility];  Service: Cardiovascular;  Laterality: N/A;  . CORONARY ARTERY BYPASS GRAFT N/A 04/22/2016   Procedure: CORONARY ARTERY BYPASS GRAFTING (CABG)x4 with endoscopic harvesting of right saphenous vein SVG to diagonal SVG to PDA Left Radial artery to OM 2 LIMA to LAD;  Surgeon: Melrose Nakayama, MD;  Location: [facility];  Service: Open Heart Surgery;  Laterality: N/A;  . RADIAL ARTERY HARVEST Left 04/22/2016   Procedure: LEFT RADIAL ARTERY HARVEST;  Surgeon: Melrose Nakayama, MD;  Location: [facility];  Service: Open Heart Surgery;  Laterality: Left;  . TEE WITHOUT CARDIOVERSION N/A 04/22/2016   Procedure: TRANSESOPHAGEAL ECHOCARDIOGRAM (TEE);  Surgeon: Melrose Nakayama, MD;  Location: [facility];  Service: Open Heart Surgery;  Laterality: N/A;    Current Outpatient Medications  Medication Sig  Dispense Refill  . aspirin EC 81 MG tablet Take 81 mg by mouth every morning.     . clopidogrel (PLAVIX) 75 MG tablet TAKE 1 TABLET DAILY (NEEDS APPOINTMENT FOR REFILLS.) 90 tablet 2  . co-enzyme Q-10 50 MG capsule Take 50 mg by mouth daily.    . diclofenac (VOLTAREN) 75 MG EC tablet Take 75 mg by mouth 2 (two) times daily.     . fenofibrate 54 MG tablet TAKE 1 TABLET EVERY DAY 90 tablet 1  . glimepiride (AMARYL) 2 MG tablet Take 2 mg by mouth 2 (two) times daily.    Marland Kitchen HYDROcodone-acetaminophen (NORCO) 10-325 MG tablet as needed.  0  . metFORMIN (GLUCOPHAGE) 1000 MG tablet Take 1,000 mg by mouth 2 (two) times daily with a meal.    . metoprolol tartrate (LOPRESSOR) 25 MG tablet Take 1 tablet (25 mg total) by mouth 2 (two) times daily. 180 tablet 2  . Multiple Vitamin (MULTIVITAMIN) tablet Take 1 tablet by mouth daily.    . Omega-3 Fatty Acids (FISH OIL) 1200 MG CAPS Take 1 capsule by mouth 2 (two) times daily.     . Oxycodone HCl 10 MG TABS Take 1 tablet by mouth as needed.    . pantoprazole (PROTONIX) 40 MG tablet TAKE 1 TABLET EVERY DAY 90 tablet 1  . pravastatin (PRAVACHOL) 80 MG tablet TAKE 1 TABLET EVERY DAY 90 tablet 2  . ramipril (ALTACE) 5 MG capsule Take 1 capsule (5 mg total) by  mouth daily. 90 capsule 2   No current facility-administered medications for this visit.     Allergies as of 07/21/2018  . (No Known Allergies)    Family History  Problem Relation Age of Onset  . Coronary artery disease Father 48       CABG  . Coronary artery disease Brother 64       CABG  . Colon cancer Neg Hx   . Colon polyps Neg Hx     Social History   Socioeconomic History  . Marital status: Married    Spouse name: Not on file  . Number of children: Not on file  . Years of education: Not on file  . Highest education level: Not on file  Occupational History  . Occupation: retired    Comment: Engineering geologist in [employer]  . Financial resource strain: Not on file  . Food  insecurity:    Worry: Not on file    Inability: Not on file  . Transportation needs:    Medical: Not on file    Non-medical: Not on file  Tobacco Use  . Smoking status: Never Smoker  . Smokeless tobacco: Never Used  Substance and Sexual Activity  . Alcohol use: No  . Drug use: No  . Sexual activity: Not on file  Lifestyle  . Physical activity:    Days per week: Not on file    Minutes per session: Not on file  . Stress: Not on file  Relationships  . Social connections:    Talks on phone: Not on file    Gets together: Not on file    Attends religious service: Not on file    Active member of club or organization: Not on file    Attends meetings of clubs or organizations: Not on file    Relationship status: Not on file  . Intimate partner violence:    Fear of current or ex partner: Not on file    Emotionally abused: Not on file    Physically abused: Not on file    Forced sexual activity: Not on file  Other Topics Concern  . Not on file  Social History Narrative   Has 3 children   Has 2 grandchildren    Review of Systems: Gen: Denies any fever, chills, fatigue, weight loss, lack of appetite.  CV: Denies chest pain, heart palpitations, peripheral edema, syncope.  Resp: Denies shortness of breath at rest or with exertion. Denies wheezing or cough.  GI: see HPI GU : Denies urinary burning, urinary frequency, urinary hesitancy MS: Denies joint pain, muscle weakness, cramps, or limitation of movement.  Derm: Denies rash, itching, dry skin Psych: Denies depression, anxiety, memory loss, and confusion Heme: Denies bruising, bleeding, and enlarged lymph nodes.  Physical Exam: BP (!) 144/80   Pulse 73   Temp 97.6 F (36.4 C) (Oral)   Ht 6' (1.829 m)   Wt 215 lb 12.8 oz (97.9 kg)   BMI 29.27 kg/m  General:   Alert and oriented. Pleasant and cooperative. Well-nourished and well-developed.  Head:  Normocephalic and atraumatic. Eyes:  Without icterus, sclera clear and  conjunctiva pink.  Ears:  Normal auditory acuity. Nose:  No deformity, discharge,  or lesions. Mouth:  No deformity or lesions, oral mucosa pink.  Lungs:  Clear to auscultation bilaterally. No wheezes, rales, or rhonchi. No distress.  Heart:  S1, S2 present without murmurs appreciated.  Abdomen:  +BS, soft, non-tender and non-distended. No HSM noted. No guarding  or rebound. No masses appreciated.  Rectal:  Deferred  Msk:  Symmetrical without gross deformities. Normal posture. Extremities:  Without  edema. Neurologic:  Alert and  oriented x4 Psych:  Alert and cooperative. Normal mood and affect.

## 2018-07-21 NOTE — Patient Instructions (Signed)
We have arranged a routine screening colonoscopy with Dr. Gala Romney in the near future.  Further recommendations to follow! (check out [email or web address] for some cool socks!)   It was a pleasure to see you today. I strive to create trusting relationships with patients to provide genuine, compassionate, and quality care. I value your feedback. If you receive a survey regarding your visit,  I greatly appreciate you taking time to fill this out.   Annitta Needs, PhD, ANP-BC [facility]

## 2018-07-26 ENCOUNTER — Other Ambulatory Visit: Payer: Self-pay | Admitting: Cardiology

## 2018-07-27 NOTE — Telephone Encounter (Signed)
Rx(s) sent to pharmacy electronically.  

## 2018-08-03 DIAGNOSIS — L739 Follicular disorder, unspecified: Secondary | ICD-10-CM | POA: Diagnosis not present

## 2018-08-03 DIAGNOSIS — Z23 Encounter for immunization: Secondary | ICD-10-CM | POA: Diagnosis not present

## 2018-08-03 DIAGNOSIS — L304 Erythema intertrigo: Secondary | ICD-10-CM | POA: Diagnosis not present

## 2018-08-05 DIAGNOSIS — I739 Peripheral vascular disease, unspecified: Secondary | ICD-10-CM | POA: Diagnosis not present

## 2018-08-05 DIAGNOSIS — I209 Angina pectoris, unspecified: Secondary | ICD-10-CM | POA: Diagnosis not present

## 2018-08-05 DIAGNOSIS — Z6830 Body mass index (BMI) 30.0-30.9, adult: Secondary | ICD-10-CM | POA: Diagnosis not present

## 2018-08-05 DIAGNOSIS — E119 Type 2 diabetes mellitus without complications: Secondary | ICD-10-CM | POA: Diagnosis not present

## 2018-08-05 DIAGNOSIS — M1991 Primary osteoarthritis, unspecified site: Secondary | ICD-10-CM | POA: Diagnosis not present

## 2018-08-05 DIAGNOSIS — I10 Essential (primary) hypertension: Secondary | ICD-10-CM | POA: Diagnosis not present

## 2018-09-07 ENCOUNTER — Telehealth: Payer: Self-pay | Admitting: *Deleted

## 2018-09-07 NOTE — Telephone Encounter (Signed)
Patient TCS is r/s'd to 6/4 at 8am. New instructions and pre-op will be mailed.

## 2018-09-13 ENCOUNTER — Inpatient Hospital Stay (HOSPITAL_COMMUNITY): Admission: RE | Admit: 2018-09-13 | Payer: Medicare HMO | Source: Ambulatory Visit

## 2018-09-19 DIAGNOSIS — G894 Chronic pain syndrome: Secondary | ICD-10-CM | POA: Diagnosis not present

## 2018-10-11 ENCOUNTER — Other Ambulatory Visit: Payer: Self-pay | Admitting: Cardiology

## 2018-10-11 NOTE — Telephone Encounter (Signed)
Ramipril 5 mg refilled. 

## 2018-10-25 DIAGNOSIS — E669 Obesity, unspecified: Secondary | ICD-10-CM | POA: Diagnosis not present

## 2018-10-25 DIAGNOSIS — G894 Chronic pain syndrome: Secondary | ICD-10-CM | POA: Diagnosis not present

## 2018-10-26 ENCOUNTER — Telehealth: Payer: Self-pay

## 2018-10-26 NOTE — Telephone Encounter (Signed)
TCS w/Propofol w/RMR scheduled for 11/10/18 needs to be rescheduled d/t COVID-19 restrictions. Called and informed pt. Will call pt later to reschedule. Endo scheduler aware.

## 2018-11-03 ENCOUNTER — Other Ambulatory Visit (HOSPITAL_COMMUNITY): Payer: Medicare HMO

## 2018-11-03 ENCOUNTER — Other Ambulatory Visit: Payer: Self-pay | Admitting: *Deleted

## 2018-11-03 ENCOUNTER — Telehealth: Payer: Self-pay | Admitting: *Deleted

## 2018-11-03 DIAGNOSIS — Z1211 Encounter for screening for malignant neoplasm of colon: Secondary | ICD-10-CM

## 2018-11-03 NOTE — Telephone Encounter (Signed)
Called patient and he is scheduled for TCS with propofol with RMR on 01/09/2019 at 8:30am. Patient already has prep at home. I advised will mail new instructions with pre-op appt. Orders entered.

## 2018-11-04 DIAGNOSIS — Z0001 Encounter for general adult medical examination with abnormal findings: Secondary | ICD-10-CM | POA: Diagnosis not present

## 2018-11-04 DIAGNOSIS — E7849 Other hyperlipidemia: Secondary | ICD-10-CM | POA: Diagnosis not present

## 2018-11-04 DIAGNOSIS — Z681 Body mass index (BMI) 19 or less, adult: Secondary | ICD-10-CM | POA: Diagnosis not present

## 2018-11-04 DIAGNOSIS — I2510 Atherosclerotic heart disease of native coronary artery without angina pectoris: Secondary | ICD-10-CM | POA: Diagnosis not present

## 2018-11-04 DIAGNOSIS — Z1389 Encounter for screening for other disorder: Secondary | ICD-10-CM | POA: Diagnosis not present

## 2018-11-04 DIAGNOSIS — E1129 Type 2 diabetes mellitus with other diabetic kidney complication: Secondary | ICD-10-CM | POA: Diagnosis not present

## 2018-11-04 DIAGNOSIS — I10 Essential (primary) hypertension: Secondary | ICD-10-CM | POA: Diagnosis not present

## 2018-11-10 ENCOUNTER — Encounter (HOSPITAL_COMMUNITY): Admission: RE | Payer: Self-pay | Source: Home / Self Care

## 2018-11-10 ENCOUNTER — Ambulatory Visit (HOSPITAL_COMMUNITY): Admission: RE | Admit: 2018-11-10 | Payer: Medicare HMO | Source: Home / Self Care | Admitting: Internal Medicine

## 2018-11-10 SURGERY — COLONOSCOPY WITH PROPOFOL
Anesthesia: Monitor Anesthesia Care

## 2018-11-21 DIAGNOSIS — Z0001 Encounter for general adult medical examination with abnormal findings: Secondary | ICD-10-CM | POA: Diagnosis not present

## 2018-11-21 DIAGNOSIS — E782 Mixed hyperlipidemia: Secondary | ICD-10-CM | POA: Diagnosis not present

## 2018-11-21 DIAGNOSIS — Z681 Body mass index (BMI) 19 or less, adult: Secondary | ICD-10-CM | POA: Diagnosis not present

## 2018-11-21 DIAGNOSIS — E7849 Other hyperlipidemia: Secondary | ICD-10-CM | POA: Diagnosis not present

## 2018-11-21 DIAGNOSIS — E119 Type 2 diabetes mellitus without complications: Secondary | ICD-10-CM | POA: Diagnosis not present

## 2018-11-21 DIAGNOSIS — Z1389 Encounter for screening for other disorder: Secondary | ICD-10-CM | POA: Diagnosis not present

## 2018-11-21 DIAGNOSIS — N182 Chronic kidney disease, stage 2 (mild): Secondary | ICD-10-CM | POA: Diagnosis not present

## 2018-12-01 DIAGNOSIS — G894 Chronic pain syndrome: Secondary | ICD-10-CM | POA: Diagnosis not present

## 2019-01-05 ENCOUNTER — Telehealth: Payer: Self-pay | Admitting: *Deleted

## 2019-01-05 ENCOUNTER — Other Ambulatory Visit: Payer: Self-pay

## 2019-01-05 ENCOUNTER — Other Ambulatory Visit (HOSPITAL_COMMUNITY)
Admission: RE | Admit: 2019-01-05 | Discharge: 2019-01-05 | Disposition: A | Discharge status: Home / Self Care | Payer: Medicare HMO | Source: Ambulatory Visit | Attending: Internal Medicine | Admitting: Internal Medicine

## 2019-01-05 ENCOUNTER — Encounter (HOSPITAL_COMMUNITY)
Admission: RE | Admit: 2019-01-05 | Discharge: 2019-01-05 | Disposition: A | Discharge status: Home / Self Care | Payer: Medicare HMO | Source: Ambulatory Visit | Attending: Internal Medicine | Admitting: Internal Medicine

## 2019-01-05 DIAGNOSIS — Z20828 Contact with and (suspected) exposure to other viral communicable diseases: Secondary | ICD-10-CM | POA: Diagnosis not present

## 2019-01-05 LAB — SARS CORONAVIRUS 2 (TAT 6-24 HRS): SARS Coronavirus 2: NEGATIVE

## 2019-01-05 NOTE — Telephone Encounter (Signed)
[place] website to see if PA was needed for TCS. Received message "BLOCKING - Product does not have notification requirements"

## 2019-01-09 ENCOUNTER — Ambulatory Visit (HOSPITAL_COMMUNITY)
Admission: RE | Admit: 2019-01-09 | Discharge: 2019-01-09 | Disposition: A | Discharge status: Home / Self Care | Payer: Medicare HMO | Attending: Internal Medicine | Admitting: Internal Medicine

## 2019-01-09 ENCOUNTER — Ambulatory Visit (HOSPITAL_COMMUNITY): Payer: Medicare HMO | Admitting: Anesthesiology

## 2019-01-09 ENCOUNTER — Other Ambulatory Visit: Payer: Self-pay

## 2019-01-09 ENCOUNTER — Encounter (HOSPITAL_COMMUNITY)
Admission: RE | Disposition: A | Discharge status: Home / Self Care | Payer: Self-pay | Source: Home / Self Care | Attending: Internal Medicine

## 2019-01-09 ENCOUNTER — Encounter (HOSPITAL_COMMUNITY): Payer: Self-pay | Admitting: *Deleted

## 2019-01-09 DIAGNOSIS — I2510 Atherosclerotic heart disease of native coronary artery without angina pectoris: Secondary | ICD-10-CM | POA: Diagnosis not present

## 2019-01-09 DIAGNOSIS — Z791 Long term (current) use of non-steroidal anti-inflammatories (NSAID): Secondary | ICD-10-CM | POA: Diagnosis not present

## 2019-01-09 DIAGNOSIS — E1151 Type 2 diabetes mellitus with diabetic peripheral angiopathy without gangrene: Secondary | ICD-10-CM | POA: Insufficient documentation

## 2019-01-09 DIAGNOSIS — Z6828 Body mass index (BMI) 28.0-28.9, adult: Secondary | ICD-10-CM | POA: Diagnosis not present

## 2019-01-09 DIAGNOSIS — E785 Hyperlipidemia, unspecified: Secondary | ICD-10-CM | POA: Insufficient documentation

## 2019-01-09 DIAGNOSIS — K219 Gastro-esophageal reflux disease without esophagitis: Secondary | ICD-10-CM | POA: Diagnosis not present

## 2019-01-09 DIAGNOSIS — I10 Essential (primary) hypertension: Secondary | ICD-10-CM | POA: Diagnosis not present

## 2019-01-09 DIAGNOSIS — Z8249 Family history of ischemic heart disease and other diseases of the circulatory system: Secondary | ICD-10-CM | POA: Diagnosis not present

## 2019-01-09 DIAGNOSIS — K5730 Diverticulosis of large intestine without perforation or abscess without bleeding: Secondary | ICD-10-CM | POA: Insufficient documentation

## 2019-01-09 DIAGNOSIS — Z79899 Other long term (current) drug therapy: Secondary | ICD-10-CM | POA: Diagnosis not present

## 2019-01-09 DIAGNOSIS — Z1211 Encounter for screening for malignant neoplasm of colon: Secondary | ICD-10-CM | POA: Insufficient documentation

## 2019-01-09 DIAGNOSIS — Z951 Presence of aortocoronary bypass graft: Secondary | ICD-10-CM | POA: Diagnosis not present

## 2019-01-09 DIAGNOSIS — Z7984 Long term (current) use of oral hypoglycemic drugs: Secondary | ICD-10-CM | POA: Insufficient documentation

## 2019-01-09 DIAGNOSIS — E669 Obesity, unspecified: Secondary | ICD-10-CM | POA: Diagnosis not present

## 2019-01-09 DIAGNOSIS — Z7982 Long term (current) use of aspirin: Secondary | ICD-10-CM | POA: Insufficient documentation

## 2019-01-09 HISTORY — PX: COLONOSCOPY WITH PROPOFOL: SHX5780

## 2019-01-09 LAB — GLUCOSE, CAPILLARY
Glucose-Capillary: 120 mg/dL — ABNORMAL HIGH (ref 70–99)
Glucose-Capillary: 126 mg/dL — ABNORMAL HIGH (ref 70–99)

## 2019-01-09 SURGERY — COLONOSCOPY WITH PROPOFOL
Anesthesia: General

## 2019-01-09 MED ORDER — PROPOFOL 500 MG/50ML IV EMUL
INTRAVENOUS | Status: DC | PRN
  Administered 2019-01-09: 150 ug/kg/min via INTRAVENOUS

## 2019-01-09 MED ORDER — KETAMINE HCL 50 MG/5ML IJ SOSY
PREFILLED_SYRINGE | INTRAMUSCULAR | Status: AC
  Filled 2019-01-09: qty 5

## 2019-01-09 MED ORDER — KETAMINE HCL 10 MG/ML IJ SOLN
INTRAMUSCULAR | Status: DC | PRN
  Administered 2019-01-09: 20 mg via INTRAVENOUS

## 2019-01-09 MED ORDER — LACTATED RINGERS IV SOLN
Freq: Once | INTRAVENOUS | Status: AC
  Administered 2019-01-09: 08:00:00 via INTRAVENOUS

## 2019-01-09 MED ORDER — LACTATED RINGERS IV SOLN
INTRAVENOUS | Status: DC | PRN
  Administered 2019-01-09: 08:00:00 via INTRAVENOUS

## 2019-01-09 MED ORDER — CHLORHEXIDINE GLUCONATE CLOTH 2 % EX PADS: 6.0000 | MEDICATED_PAD | Freq: Once | CUTANEOUS | Status: DC

## 2019-01-09 MED ORDER — PROPOFOL 10 MG/ML IV BOLUS
INTRAVENOUS | Status: DC | PRN
  Administered 2019-01-09: 20 mg via INTRAVENOUS

## 2019-01-09 MED ORDER — LIDOCAINE HCL (CARDIAC) PF 100 MG/5ML IV SOSY
PREFILLED_SYRINGE | INTRAVENOUS | Status: DC | PRN
  Administered 2019-01-09: 40 mg via INTRAVENOUS

## 2019-01-09 MED ORDER — GLYCOPYRROLATE 0.2 MG/ML IJ SOLN
INTRAMUSCULAR | Status: DC | PRN
  Administered 2019-01-09: 0.2 mg via INTRAVENOUS

## 2019-01-09 MED ORDER — PROPOFOL 10 MG/ML IV BOLUS
INTRAVENOUS | Status: AC
  Filled 2019-01-09: qty 40

## 2019-01-09 NOTE — Discharge Instructions (Signed)
Colonoscopy Discharge Instructions  Read the instructions outlined below and refer to this sheet in the next few weeks. These discharge instructions provide you with general information on caring for yourself after you leave the hospital. Your doctor may also give you specific instructions. While your treatment has been planned according to the most current medical practices available, unavoidable complications occasionally occur. If you have any problems or questions after discharge, call Dr. Gala Romney at [phone]. ACTIVITY  You may resume your regular activity, but move at a slower pace for the next 24 hours.   Take frequent rest periods for the next 24 hours.   Walking will help get rid of the air and reduce the bloated feeling in your belly (abdomen).   No driving for 24 hours (because of the medicine (anesthesia) used during the test).    Do not sign any important legal documents or operate any machinery for 24 hours (because of the anesthesia used during the test).  NUTRITION  Drink plenty of fluids.   You may resume your normal diet as instructed by your doctor.   Begin with a light meal and progress to your normal diet. Heavy or fried foods are harder to digest and may make you feel sick to your stomach (nauseated).   Avoid alcoholic beverages for 24 hours or as instructed.  MEDICATIONS  You may resume your normal medications unless your doctor tells you otherwise.  WHAT YOU CAN EXPECT TODAY  Some feelings of bloating in the abdomen.   Passage of more gas than usual.   Spotting of blood in your stool or on the toilet paper.  IF YOU HAD POLYPS REMOVED DURING THE COLONOSCOPY:  No aspirin products for 7 days or as instructed.   No alcohol for 7 days or as instructed.   Eat a soft diet for the next 24 hours.  FINDING OUT THE RESULTS OF YOUR TEST Not all test results are available during your visit. If your test results are not back during the visit, make an appointment  with your caregiver to find out the results. Do not assume everything is normal if you have not heard from your caregiver or the medical facility. It is important for you to follow up on all of your test results.  SEEK IMMEDIATE MEDICAL ATTENTION IF:  You have more than a spotting of blood in your stool.   Your belly is swollen (abdominal distention).   You are nauseated or vomiting.   You have a temperature over 101.   You have abdominal pain or discomfort that is severe or gets worse throughout the day.    Diverticulosis information provided  1 more screening colonoscopy in 10 years  I called and at [phone] but was not able to make contact.      Diverticulosis  Diverticulosis is a condition that develops when small pouches (diverticula) form in the wall of the large intestine (colon). The colon is where water is absorbed and stool is formed. The pouches form when the inside layer of the colon pushes through weak spots in the outer layers of the colon. You may have a few pouches or many of them. What are the causes? The cause of this condition is not known. What increases the risk? The following factors may make you more likely to develop this condition:  Being older than age 83. Your risk for this condition increases with age. Diverticulosis is rare among people younger than age 36. By age 19, many people have it.  Eating a low-fiber diet.  Having frequent constipation.  Being overweight.  Not getting enough exercise.  Smoking.  Taking over-the-counter pain medicines, like aspirin and ibuprofen.  Having a family history of diverticulosis. What are the signs or symptoms? In most people, there are no symptoms of this condition. If you do have symptoms, they may include:  Bloating.  Cramps in the abdomen.  Constipation or diarrhea.  Pain in the lower left side of the abdomen. How is this diagnosed? This condition is most often diagnosed during an exam for  other colon problems. Because diverticulosis usually has no symptoms, it often cannot be diagnosed independently. This condition may be diagnosed by:  Using a flexible scope to examine the colon (colonoscopy).  Taking an X-ray of the colon after dye has been put into the colon (barium enema).  Doing a CT scan. How is this treated? You may not need treatment for this condition if you have never developed an infection related to diverticulosis. If you have had an infection before, treatment may include:  Eating a high-fiber diet. This may include eating more fruits, vegetables, and grains.  Taking a fiber supplement.  Taking a live bacteria supplement (probiotic).  Taking medicine to relax your colon.  Taking antibiotic medicines. Follow these instructions at home:  Drink 6-8 glasses of water or more each day to prevent constipation.  Try not to strain when you have a bowel movement.  If you have had an infection before: ? Eat more fiber as directed by your health care provider or your diet and nutrition specialist (dietitian). ? Take a fiber supplement or probiotic, if your health care provider approves.  Take over-the-counter and prescription medicines only as told by your health care provider.  If you were prescribed an antibiotic, take it as told by your health care provider. Do not stop taking the antibiotic even if you start to feel better.  Keep all follow-up visits as told by your health care provider. This is important. Contact a health care provider if:  You have pain in your abdomen.  You have bloating.  You have cramps.  You have not had a bowel movement in 3 days. Get help right away if:  Your pain gets worse.  Your bloating becomes very bad.  You have a fever or chills, and your symptoms suddenly get worse.  You vomit.  You have bowel movements that are bloody or black.  You have bleeding from your rectum. Summary  Diverticulosis is a condition  that develops when small pouches (diverticula) form in the wall of the large intestine (colon).  You may have a few pouches or many of them.  This condition is most often diagnosed during an exam for other colon problems.  If you have had an infection related to diverticulosis, treatment may include increasing the fiber in your diet, taking supplements, or taking medicines. This information is not intended to replace advice given to you by your health care provider. Make sure you discuss any questions you have with your health care provider. Document Released: 02/20/2004 Document Revised: 05/07/2017 Document Reviewed: 04/13/2016 Elsevier Patient Education  2020 [place] After These instructions provide you with information about caring for yourself after your procedure. Your health care provider may also give you more specific instructions. Your treatment has been planned according to current medical practices, but problems sometimes occur. Call your health care provider if you have any problems or questions after your  procedure. What can I expect after the procedure? After your procedure, you may:  Feel sleepy for several hours.  Feel clumsy and have poor balance for several hours.  Feel forgetful about what happened after the procedure.  Have poor judgment for several hours.  Feel nauseous or vomit.  Have a sore throat if you had a breathing tube during the procedure. Follow these instructions at home: For at least 24 hours after the procedure:      Have a responsible adult stay with you. It is important to have someone help care for you until you are awake and alert.  Rest as needed.  Do not: ? Participate in activities in which you could fall or become injured. ? Drive. ? Use heavy machinery. ? Drink alcohol. ? Take sleeping pills or medicines that cause drowsiness. ? Make important decisions or sign legal documents. ? Take  care of children on your own. Eating and drinking  Follow the diet that is recommended by your health care provider.  If you vomit, drink water, juice, or soup when you can drink without vomiting.  Make sure you have little or no nausea before eating solid foods. General instructions  Take over-the-counter and prescription medicines only as told by your health care provider.  If you have sleep apnea, surgery and certain medicines can increase your risk for breathing problems. Follow instructions from your health care provider about wearing your sleep device: ? Anytime you are sleeping, including during daytime naps. ? While taking prescription pain medicines, sleeping medicines, or medicines that make you drowsy.  If you smoke, do not smoke without supervision.  Keep all follow-up visits as told by your health care provider. This is important. Contact a health care provider if:  You keep feeling nauseous or you keep vomiting.  You feel light-headed.  You develop a rash.  You have a fever. Get help right away if:  You have trouble breathing. Summary  For several hours after your procedure, you may feel sleepy and have poor judgment.  Have a responsible adult stay with you for at least 24 hours or until you are awake and alert. This information is not intended to replace advice given to you by your health care provider. Make sure you discuss any questions you have with your health care provider. Document Released: 09/15/2015 Document Revised: 08/23/2017 Document Reviewed: 09/15/2015 Elsevier Patient Education  2020 [employer].

## 2019-01-09 NOTE — H&P (Signed)
@LOGO @   Primary Care Physician:  Redmond School, MD Primary Gastroenterologist:  Dr. Gala Romney  Pre-Procedure History & Physical: HPI:  Jonathan Shepard is a 68 y.o. male here for screening colonoscopy.  No bowel symptoms.  Remote colonoscopy 2002-records unavailable.  No family history of colon cancer.  Past Medical History:  Diagnosis Date  . CAD (coronary artery disease)   . Diabetes ([place])   . Dyspnea   . GERD (gastroesophageal reflux disease)   . Hyperlipidemia   . Hypertension   . Joint pain    hand, related to work  . Obesity     Past Surgical History:  Procedure Laterality Date  . CARDIAC CATHETERIZATION N/A 04/14/2016   Procedure: Left Heart Cath and Coronary Angiography;  Surgeon: Peter M Martinique, MD;  Location: [place] [facility];  Service: Cardiovascular;  Laterality: N/A;  . CORONARY ARTERY BYPASS GRAFT N/A 04/22/2016   Procedure: CORONARY ARTERY BYPASS GRAFTING (CABG)x4 with endoscopic harvesting of right saphenous vein SVG to diagonal SVG to PDA Left Radial artery to OM 2 LIMA to LAD;  Surgeon: Melrose Nakayama, MD;  Location: [place];  Service: Open Heart Surgery;  Laterality: N/A;  . RADIAL ARTERY HARVEST Left 04/22/2016   Procedure: LEFT RADIAL ARTERY HARVEST;  Surgeon: Melrose Nakayama, MD;  Location: [place];  Service: Open Heart Surgery;  Laterality: Left;  . TEE WITHOUT CARDIOVERSION N/A 04/22/2016   Procedure: TRANSESOPHAGEAL ECHOCARDIOGRAM (TEE);  Surgeon: Melrose Nakayama, MD;  Location: [place];  Service: Open Heart Surgery;  Laterality: N/A;    Prior to Admission medications   Medication Sig Start Date End Date Taking? Authorizing Provider  aspirin EC 81 MG tablet Take 81 mg by mouth every morning.    Yes [provider]  clopidogrel (PLAVIX) 75 MG tablet TAKE 1 TABLET DAILY (NEEDS APPOINTMENT FOR REFILLS.) Patient taking differently: Take 75 mg by mouth daily.  05/10/18  Yes Croitoru, Mihai, MD  Co-Enzyme Q-10 100 MG CAPS Take 100 mg by  mouth daily.    Yes [provider]  diclofenac (VOLTAREN) 75 MG EC tablet Take 75 mg by mouth 2 (two) times daily.    Yes [provider]  fenofibrate 54 MG tablet TAKE 1 TABLET EVERY DAY Patient taking differently: Take 54 mg by mouth daily.  05/10/18  Yes Kilroy, Luke K, PA-C  glimepiride (AMARYL) 2 MG tablet Take 2 mg by mouth 2 (two) times daily. 06/27/17  Yes [provider]  HYDROcodone-acetaminophen (NORCO) 10-325 MG tablet Take 1 tablet by mouth every 6 (six) hours as needed for moderate pain.  02/22/18  Yes [provider]  metFORMIN (GLUCOPHAGE) 1000 MG tablet Take 1,000 mg by mouth 2 (two) times daily with a meal.   Yes [provider]  metoprolol tartrate (LOPRESSOR) 25 MG tablet Take 1 tablet (25 mg total) by mouth 2 (two) times daily. 07/27/18  Yes Croitoru, Mihai, MD  Multiple Vitamin (MULTIVITAMIN) tablet Take 1 tablet by mouth daily.   Yes [provider]  Omega-3 Fatty Acids (FISH OIL) 1200 MG CAPS Take 1,200 mg by mouth 3 (three) times daily. Take 2400 mg by mouth in the morning and 1200 mg in the evening   Yes [provider]  Oxycodone HCl 10 MG TABS Take 10 mg by mouth daily as needed (breakthrough pain).  07/08/18  Yes [provider]  pantoprazole (PROTONIX) 40 MG tablet TAKE 1 TABLET EVERY DAY Patient taking differently: Take 40 mg by mouth daily.  05/10/18  Yes Erlene Quan, PA-C  pravastatin (PRAVACHOL) 80 MG tablet TAKE 1 TABLET EVERY DAY Patient taking differently: Take 80 mg by mouth daily.  05/10/18  Yes Croitoru, Mihai, MD  ramipril (ALTACE) 5 MG capsule TAKE 1 CAPSULE EVERY DAY Patient taking differently: Take 5 mg by mouth daily.  10/11/18  Yes Kerin Ransom K, PA-C    Allergies as of 11/03/2018  . (No Known Allergies)    Family History  Problem Relation Age of Onset  . Coronary artery disease Father 29       CABG  . Coronary artery disease Brother 36       CABG  . Colon cancer Neg Hx   .  Colon polyps Neg Hx     Social History   Socioeconomic History  . Marital status: Married    Spouse name: Not on file  . Number of children: Not on file  . Years of education: Not on file  . Highest education level: Not on file  Occupational History  . Occupation: retired    Comment: Engineering geologist in [employer]  . Financial resource strain: Not on file  . Food insecurity    Worry: Not on file    Inability: Not on file  . Transportation needs    Medical: Not on file    Non-medical: Not on file  Tobacco Use  . Smoking status: Never Smoker  . Smokeless tobacco: Never Used  Substance and Sexual Activity  . Alcohol use: No  . Drug use: No  . Sexual activity: Not on file  Lifestyle  . Physical activity    Days per week: Not on file    Minutes per session: Not on file  . Stress: Not on file  Relationships  . Social Herbalist on phone: Not on file    Gets together: Not on file    Attends religious service: Not on file    Active member of club or organization: Not on file    Attends meetings of clubs or organizations: Not on file    Relationship status: Not on file  . Intimate partner violence    Fear of current or ex partner: Not on file    Emotionally abused: Not on file    Physically abused: Not on file    Forced sexual activity: Not on file  Other Topics Concern  . Not on file  Social History Narrative   Has 3 children   Has 2 grandchildren    Review of Systems: See HPI, otherwise negative ROS  Physical Exam: BP (!) 141/82   Pulse 70   Temp 98.1 F (36.7 C) (Oral)   Resp 16   Ht 6' (1.829 m)   Wt 95.3 kg   SpO2 98%   BMI 28.48 kg/m  General:   Alert,  Well-developed, well-nourished, pleasant and cooperative in NAD Mouth:  No deformity or lesions. Neck:  Supple; no masses or thyromegaly. No significant cervical adenopathy. Lungs:  Clear throughout to auscultation.   No wheezes, crackles, or rhonchi. No acute distress. Heart:   Regular rate and rhythm; no murmurs, clicks, rubs,  or gallops. Abdomen: Non-distended, normal bowel sounds.  Soft and nontender without appreciable mass or hepatosplenomegaly.  Pulses:  Normal pulses noted. Extremities:  Without clubbing or edema.  Impression/Plan: Here for screening colonoscopy.  The risks, benefits, limitations, alternatives and imponderables have been reviewed with the patient. Questions have been answered. All parties are agreeable.  Notice: This dictation was prepared with Dragon dictation along with smaller phrase technology. Any transcriptional errors that result from this process are unintentional and may not be corrected upon review.

## 2019-01-09 NOTE — Transfer of Care (Signed)
Immediate Anesthesia Transfer of Care Note  Patient: ZACHERY NISWANDER  Procedure(s) Performed: COLONOSCOPY WITH PROPOFOL (N/A )  Patient Location: PACU  Anesthesia Type:General  Level of Consciousness: awake, alert , oriented and patient cooperative  Airway & Oxygen Therapy: Patient Spontanous Breathing  Post-op Assessment: Report given to RN and Post -op Vital signs reviewed and stable  Post vital signs: Reviewed and stable  Last Vitals:  Vitals Value Taken Time  BP    Temp    Pulse 84 01/09/19 0848  Resp    SpO2 95 % 01/09/19 0848  Vitals shown include unvalidated device data.  Last Pain:  Vitals:   01/09/19 0822  TempSrc:   PainSc: 0-No pain      Patients Stated Pain Goal: 5 (11/94/17 4081)  Complications: No apparent anesthesia complications

## 2019-01-09 NOTE — Anesthesia Procedure Notes (Signed)
Procedure Name: General with mask airway Performed by: Andree Elk Amy A, CRNA Pre-anesthesia Checklist: Patient identified, Emergency Drugs available, Suction available, Patient being monitored and Timeout performed Patient Re-evaluated:Patient Re-evaluated prior to induction Oxygen Delivery Method: Non-rebreather mask

## 2019-01-09 NOTE — Anesthesia Postprocedure Evaluation (Signed)
Anesthesia Post Note  Patient: Jonathan Shepard  Procedure(s) Performed: COLONOSCOPY WITH PROPOFOL (N/A )  Patient location during evaluation: PACU Anesthesia Type: General Level of consciousness: awake and alert and oriented Pain management: pain level controlled Vital Signs Assessment: post-procedure vital signs reviewed and stable Respiratory status: spontaneous breathing Cardiovascular status: stable Postop Assessment: no apparent nausea or vomiting Anesthetic complications: no     Last Vitals:  Vitals:   01/09/19 0722 01/09/19 0735  BP: (!) 141/82 128/77  Pulse: 70   Resp: 16   Temp: 36.7 C   SpO2: 98%     Last Pain:  Vitals:   01/09/19 0822  TempSrc:   PainSc: 0-No pain                 [name] A

## 2019-01-09 NOTE — Anesthesia Preprocedure Evaluation (Signed)
Anesthesia Evaluation    Airway Mallampati: III  TM Distance: >3 FB Neck ROM: Full    Dental   Front upper bridge :   Pulmonary shortness of breath,    breath sounds clear to auscultation       Cardiovascular Exercise Tolerance: Good hypertension, + CAD, + Cardiac Stents, + CABG and + Peripheral Vascular Disease  + dysrhythmias  Rhythm:Regular Rate:Normal  Impression Exercise Capacity:  Good exercise capacity. BP Response:  Normal blood pressure response. Clinical Symptoms:  No significant symptoms noted. ECG Impression:  No significant ST segment change suggestive of ischemia. Comparison with Prior Nuclear Study: No significant change from previous study  Overall Impression:  Normal stress nuclear study.  LV Wall Motion:  NL LV Function; NL Wall Motion   Lorretta Harp, MD  12/15/2012 5:29 PM    Neuro/Psych    GI/Hepatic GERD  ,  Endo/Other  diabetes, Well Controlled, Type 2  Renal/GU      Musculoskeletal   Abdominal   Peds  Hematology   Anesthesia Other Findings   Reproductive/Obstetrics                             Anesthesia Physical Anesthesia Plan  ASA: III  Anesthesia Plan: General   Post-op Pain Management:    Induction:   PONV Risk Score and Plan: 0 and TIVA  Airway Management Planned: Mask and Nasal Cannula  Additional Equipment:   Intra-op Plan:   Post-operative Plan:   Informed Consent: I have reviewed the patients History and Physical, chart, labs and discussed the procedure including the risks, benefits and alternatives for the proposed anesthesia with the patient or authorized representative who has indicated his/her understanding and acceptance.       Plan Discussed with:   Anesthesia Plan Comments:         Anesthesia Quick Evaluation

## 2019-01-09 NOTE — Op Note (Signed)
[facility] Patient Name: Jonathan Shepard Procedure Date: 01/09/2019 8:09 AM MRN: [id number] Date of Birth: 1951-03-22 Attending MD: Norvel Richards , MD CSN: [id number] Age: 68 Admit Type: Outpatient Procedure:                Colonoscopy Indications:              Screening for colorectal malignant neoplasm Providers:                Norvel Richards, MD, Otis Peak B. Sharon Seller, RN,                            Aram Candela Referring MD:              Medicines:                Propofol per Anesthesia Complications:            No immediate complications. Estimated Blood Loss:     Estimated blood loss: none. Procedure:                Pre-Anesthesia Assessment:                           - Prior to the procedure, a History and Physical                            was performed, and patient medications and                            allergies were reviewed. The patient's tolerance of                            previous anesthesia was also reviewed. The risks                            and benefits of the procedure and the sedation                            options and risks were discussed with the patient.                            All questions were answered, and informed consent                            was obtained. Prior Anticoagulants: The patient has                            taken no previous anticoagulant or antiplatelet                            agents. ASA Grade Assessment: II - A patient with                            mild systemic disease. After reviewing the risks  and benefits, the patient was deemed in                            satisfactory condition to undergo the procedure.                           After obtaining informed consent, the colonoscope                            was passed under direct vision. Throughout the                            procedure, the patient's blood pressure, pulse, and                            oxygen  saturations were monitored continuously. The                            CF-HQ190L ([id number]) scope was introduced through                            the anus and advanced to the the cecum, identified                            by appendiceal orifice and ileocecal valve. The                            colonoscopy was performed without difficulty. The                            patient tolerated the procedure well. The quality                            of the bowel preparation was adequate. Scope In: 8:28:17 AM Scope Out: 8:42:47 AM Scope Withdrawal Time: 0 hours 9 minutes 58 seconds  Total Procedure Duration: 0 hours 14 minutes 30 seconds  Findings:      The perianal and digital rectal examinations were normal.      A few small and large-mouthed diverticula were found in the sigmoid       colon and descending colon.      The exam was otherwise without abnormality on direct and retroflexion       views. Impression:               - Diverticulosis in the sigmoid colon and in the                            descending colon.                           - The examination was otherwise normal on direct                            and retroflexion views.                           -  No specimens collected. Moderate Sedation:      Moderate (conscious) sedation was personally administered by an       anesthesia professional. The following parameters were monitored: oxygen       saturation, heart rate, blood pressure, respiratory rate, EKG, adequacy       of pulmonary ventilation, and response to care. Recommendation:           - Patient has a contact number available for                            emergencies. The signs and symptoms of potential                            delayed complications were discussed with the                            patient. Return to normal activities tomorrow.                            Written discharge instructions were provided to the                             patient.                           - Resume previous diet.                           - Continue present medications.                           - Repeat colonoscopy in 10 years for screening                            purposes.                           - Return to GI office (date not yet determined). Procedure Code(s):        --- Professional ---                           [phone], Colonoscopy, flexible; diagnostic, including                            collection of specimen(s) by brushing or washing,                            when performed (separate procedure) Diagnosis Code(s):        --- Professional ---                           Z12.11, Encounter for screening for malignant                            neoplasm of colon  K57.30, Diverticulosis of large intestine without                            perforation or abscess without bleeding CPT copyright 2019 American Medical Association. All rights reserved. The codes documented in this report are preliminary and upon coder review may  be revised to meet current compliance requirements. Cristopher Estimable. Tavon Corriher, MD Norvel Richards, MD 01/09/2019 8:50:10 AM This report has been signed electronically. Number of Addenda: 0

## 2019-01-16 DIAGNOSIS — E669 Obesity, unspecified: Secondary | ICD-10-CM | POA: Diagnosis not present

## 2019-01-16 DIAGNOSIS — G894 Chronic pain syndrome: Secondary | ICD-10-CM | POA: Diagnosis not present

## 2019-01-20 ENCOUNTER — Encounter (HOSPITAL_COMMUNITY): Payer: Self-pay | Admitting: Internal Medicine

## 2019-02-12 ENCOUNTER — Other Ambulatory Visit: Payer: Self-pay | Admitting: Cardiovascular Disease

## 2019-02-15 ENCOUNTER — Other Ambulatory Visit: Payer: Self-pay

## 2019-02-15 ENCOUNTER — Telehealth: Payer: Self-pay

## 2019-02-15 DIAGNOSIS — G894 Chronic pain syndrome: Secondary | ICD-10-CM | POA: Diagnosis not present

## 2019-02-15 DIAGNOSIS — Z6830 Body mass index (BMI) 30.0-30.9, adult: Secondary | ICD-10-CM | POA: Diagnosis not present

## 2019-02-15 DIAGNOSIS — I10 Essential (primary) hypertension: Secondary | ICD-10-CM | POA: Diagnosis not present

## 2019-02-15 DIAGNOSIS — B351 Tinea unguium: Secondary | ICD-10-CM | POA: Diagnosis not present

## 2019-02-15 DIAGNOSIS — E1129 Type 2 diabetes mellitus with other diabetic kidney complication: Secondary | ICD-10-CM | POA: Diagnosis not present

## 2019-02-15 DIAGNOSIS — E119 Type 2 diabetes mellitus without complications: Secondary | ICD-10-CM | POA: Diagnosis not present

## 2019-02-15 MED ORDER — RAMIPRIL 5 MG PO CAPS: 5.0000 mg | ORAL_CAPSULE | Freq: Every day | ORAL | 0 refills | Status: DC

## 2019-02-15 NOTE — Telephone Encounter (Signed)
Requested Prescriptions   Signed Prescriptions Disp Refills  . ramipril (ALTACE) 5 MG capsule 30 capsule 0    Sig: Take 1 capsule (5 mg total) by mouth daily.    Authorizing Provider: Erlene Quan    Ordering User: Raelene Bott, BRANDY L

## 2019-02-20 ENCOUNTER — Other Ambulatory Visit: Payer: Self-pay

## 2019-03-16 DIAGNOSIS — L03211 Cellulitis of face: Secondary | ICD-10-CM | POA: Diagnosis not present

## 2019-03-16 DIAGNOSIS — M1991 Primary osteoarthritis, unspecified site: Secondary | ICD-10-CM | POA: Diagnosis not present

## 2019-03-16 DIAGNOSIS — W64XXXA Exposure to other animate mechanical forces, initial encounter: Secondary | ICD-10-CM | POA: Diagnosis not present

## 2019-03-16 DIAGNOSIS — G894 Chronic pain syndrome: Secondary | ICD-10-CM | POA: Diagnosis not present

## 2019-04-20 DIAGNOSIS — G894 Chronic pain syndrome: Secondary | ICD-10-CM | POA: Diagnosis not present

## 2019-04-20 DIAGNOSIS — M1991 Primary osteoarthritis, unspecified site: Secondary | ICD-10-CM | POA: Diagnosis not present

## 2019-04-25 ENCOUNTER — Other Ambulatory Visit: Payer: Self-pay | Admitting: Cardiovascular Disease

## 2019-05-10 ENCOUNTER — Other Ambulatory Visit: Payer: Self-pay | Admitting: Cardiovascular Disease

## 2019-05-10 MED ORDER — CLOPIDOGREL BISULFATE 75 MG PO TABS: 75.0000 mg | ORAL_TABLET | Freq: Every day | ORAL | 0 refills | Status: AC

## 2019-05-10 NOTE — Telephone Encounter (Signed)
Requested Prescriptions   Signed Prescriptions Disp Refills  . clopidogrel (PLAVIX) 75 MG tablet 90 tablet 0    Sig: Take 1 tablet (75 mg total) by mouth daily.    Authorizing Provider: Sanda Klein    Ordering User: Raelene Bott, Tremaine Fuhriman L

## 2019-05-10 NOTE — Telephone Encounter (Signed)
°*  STAT* If patient is at the pharmacy, call can be transferred to refill team.   1. Which medications need to be refilled? (please list name of each medication and dose if known) clopidogrel (PLAVIX) 75 MG tablet  2. Which pharmacy/location (including street and city if local pharmacy) is medication to be sent to? [place], [place]  3. Do they need a 30 day or 90 day supply? 90   Patient has appt with [name] on 07/20/19

## 2019-05-24 DIAGNOSIS — E1129 Type 2 diabetes mellitus with other diabetic kidney complication: Secondary | ICD-10-CM | POA: Diagnosis not present

## 2019-05-24 DIAGNOSIS — G894 Chronic pain syndrome: Secondary | ICD-10-CM | POA: Diagnosis not present

## 2019-05-24 DIAGNOSIS — Z6831 Body mass index (BMI) 31.0-31.9, adult: Secondary | ICD-10-CM | POA: Diagnosis not present

## 2019-05-24 DIAGNOSIS — M1991 Primary osteoarthritis, unspecified site: Secondary | ICD-10-CM | POA: Diagnosis not present

## 2019-06-28 DIAGNOSIS — G894 Chronic pain syndrome: Secondary | ICD-10-CM | POA: Diagnosis not present

## 2019-06-28 DIAGNOSIS — N529 Male erectile dysfunction, unspecified: Secondary | ICD-10-CM | POA: Diagnosis not present

## 2019-06-28 DIAGNOSIS — L4050 Arthropathic psoriasis, unspecified: Secondary | ICD-10-CM | POA: Diagnosis not present

## 2019-07-20 ENCOUNTER — Ambulatory Visit (INDEPENDENT_AMBULATORY_CARE_PROVIDER_SITE_OTHER): Payer: Medicare HMO | Admitting: Cardiovascular Disease

## 2019-07-20 ENCOUNTER — Other Ambulatory Visit: Payer: Self-pay

## 2019-07-20 ENCOUNTER — Encounter: Payer: Self-pay | Admitting: Cardiovascular Disease

## 2019-07-20 VITALS — BP 138/82 | HR 70 | Temp 97.0°F | Ht 72.0 in | Wt 218.0 lb

## 2019-07-20 DIAGNOSIS — I739 Peripheral vascular disease, unspecified: Secondary | ICD-10-CM

## 2019-07-20 DIAGNOSIS — E782 Mixed hyperlipidemia: Secondary | ICD-10-CM

## 2019-07-20 DIAGNOSIS — I10 Essential (primary) hypertension: Secondary | ICD-10-CM

## 2019-07-20 DIAGNOSIS — E119 Type 2 diabetes mellitus without complications: Secondary | ICD-10-CM

## 2019-07-20 DIAGNOSIS — I2510 Atherosclerotic heart disease of native coronary artery without angina pectoris: Secondary | ICD-10-CM | POA: Diagnosis not present

## 2019-07-20 NOTE — Progress Notes (Signed)
Cardiology Office Note    Date:  07/20/2019   ID:  Jonathan Shepard, DOB 1950/07/01, MRN [id number]  PCP:  Redmond School, MD  Cardiologist:   Sanda Klein, MD   Chief Complaint  Patient presents with  . Coronary Artery Disease    History of Present Illness:  Jonathan Shepard is a 69 y.o. male with long-standing history of coronary artery disease (RCA and LAD stents 2002 and 2008,CABG Nov 2017 -  LIMA to LAD, SVG to diagonal, SVG to PDA, left radial to OM 2, Dr. Roxan Hockey) who presents in follow-up after undergoing 4 vessel bypass surgery roughly 3 years ago.  Does not have any cardiovascular complaints. The patient specifically denies any chest pain at rest exertion, dyspnea at rest or with exertion, orthopnea, paroxysmal nocturnal dyspnea, syncope, palpitations, focal neurological deficits, intermittent claudication, lower extremity edema, unexplained weight gain, cough, hemoptysis or wheezing.  He continues to work in maintenance.  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  He is on chronic dual antiplatelet therapy.  Glycemic control is good on combination sulfonylurea and Metformin with a hemoglobin A1c of 6.3%.  His lipid profile is not too bad with an LDL at 77, but his HDL remains low at 34 and his triglycerides are borderline high at 180.  His waistline is 36 inches and his BMI is just under the obesity limit at 29.5.  He has never smoked.  Blood pressure control is good.  Typical blood pressure is 120s/70s.  Initial presentation in 2002 - mid right coronary Hepacoat 3.0x13 stent.  In May 2008 had restenosis in the right coronary artery stent and "sandwich stenting" - Liberte 3.75 stent.  In December 2008 had cutting balloon angioplasty for in stent restenosis. In the same setting received a drug-eluting 3 x 15 mm Promus stent in the proximal LAD artery for a de novo lesion. July 2014 a nuclear stress test performed  for similar complaints of exertional discomfort was normal. November 2017 worsening exertional angina led to angiography and four-vessel bypass surgery.  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 He has well-controlled diabetes mellitus and mixed hyperlipidemia   Past Medical History:  Diagnosis Date  . CAD (coronary artery disease)   . Diabetes ([place])   . Dyspnea   . GERD (gastroesophageal reflux disease)   . Hyperlipidemia   . Hypertension   . Joint pain    hand, related to work  . Obesity     Past Surgical History:  Procedure Laterality Date  . CARDIAC CATHETERIZATION N/A 04/14/2016   Procedure: Left Heart Cath and Coronary Angiography;  Surgeon: Peter M Martinique, MD;  Location: [place] [facility];  Service: Cardiovascular;  Laterality: N/A;  . COLONOSCOPY WITH PROPOFOL N/A 01/09/2019   Procedure: COLONOSCOPY WITH PROPOFOL;  Surgeon: Daneil Dolin, MD;  [facility];  Service: Endoscopy;  Laterality: N/A;  8:30am  . CORONARY ARTERY BYPASS GRAFT N/A 04/22/2016   Procedure: CORONARY ARTERY BYPASS GRAFTING (CABG)x4 with endoscopic harvesting of right saphenous vein SVG to diagonal SVG to PDA Left Radial artery to OM 2 LIMA to LAD;  Surgeon: Melrose Nakayama, MD;  Location: [place];  Service: Open Heart Surgery;  Laterality: N/A;  . RADIAL ARTERY HARVEST Left 04/22/2016   Procedure: LEFT RADIAL ARTERY HARVEST;  Surgeon: Remo Lipps  Chaya Jan, MD;  Location: [place];  Service: Open Heart Surgery;  Laterality: Left;  . TEE WITHOUT CARDIOVERSION N/A 04/22/2016   Procedure: TRANSESOPHAGEAL ECHOCARDIOGRAM (TEE);  Surgeon: Melrose Nakayama, MD;  Location: [place];  Service: Open Heart Surgery;  Laterality: N/A;    Current Medications: Outpatient Medications Prior to Visit  Medication Sig Dispense Refill  . ACCU-CHEK AVIVA PLUS test strip     . aspirin EC 81 MG  tablet Take 81 mg by mouth every morning.     . clopidogrel (PLAVIX) 75 MG tablet Take 1 tablet (75 mg total) by mouth daily. 90 tablet 0  . Co-Enzyme Q-10 100 MG CAPS Take 100 mg by mouth daily.     . diclofenac (VOLTAREN) 75 MG EC tablet Take 75 mg by mouth 2 (two) times daily.     . fenofibrate 54 MG tablet TAKE 1 TABLET EVERY DAY (Patient taking differently: Take 54 mg by mouth daily. ) 90 tablet 1  . glimepiride (AMARYL) 2 MG tablet Take 2 mg by mouth 2 (two) times daily.    Marland Kitchen HYDROcodone-acetaminophen (NORCO) 10-325 MG tablet Take 1 tablet by mouth every 6 (six) hours as needed for moderate pain.   0  . hydrOXYzine (ATARAX/VISTARIL) 25 MG tablet     . metFORMIN (GLUCOPHAGE) 1000 MG tablet Take 1,000 mg by mouth 2 (two) times daily with a meal.    . metoprolol tartrate (LOPRESSOR) 25 MG tablet Take 1 tablet (25 mg total) by mouth 2 (two) times daily. 180 tablet 0  . Multiple Vitamin (MULTIVITAMIN) tablet Take 1 tablet by mouth daily.    . Omega-3 Fatty Acids (FISH OIL) 1200 MG CAPS Take 1,200 mg by mouth 3 (three) times daily. Take 2400 mg by mouth in the morning and 1200 mg in the evening    . Oxycodone HCl 10 MG TABS Take 10 mg by mouth daily as needed (breakthrough pain).     . pantoprazole (PROTONIX) 40 MG tablet TAKE 1 TABLET EVERY DAY (Patient taking differently: Take 40 mg by mouth daily. ) 90 tablet 1  . pravastatin (PRAVACHOL) 80 MG tablet TAKE 1 TABLET EVERY DAY (NEED MD APPOINTMENT) 90 tablet 2  . ramipril (ALTACE) 5 MG capsule Take 1 capsule (5 mg total) by mouth daily. 30 capsule 0  . sildenafil (VIAGRA) 100 MG tablet     . valACYclovir (VALTREX) 1000 MG tablet     . terbinafine (LAMISIL) 250 MG tablet      No facility-administered medications prior to visit.     Allergies:   Patient has no known allergies.   Social History   Socioeconomic History  . Marital status: Married    Spouse name: Not on file  . Number of children: Not on file  . Years of education: Not on  file  . Highest education level: Not on file  Occupational History  . 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 Smoking status: Never Smoker  . Smokeless tobacco: Never Used  Substance and Sexual Activity  . Alcohol use: No  . Drug use: No  . Sexual activity: Not on file  Other Topics Concern  . Not on file  Social History Narrative   Has 3 children   Has 2 grandchildren   Social Determinants of Health   Financial Resource Strain:   . Difficulty of Paying Living Expenses: Not on file  Food Insecurity:   . Worried About Charity fundraiser  in the Last Year: Not on file  . Ran Out of Food in the Last Year: Not on file  Transportation Needs:   . Lack of Transportation (Medical): Not on file  . Lack of Transportation (Non-Medical): Not on file  Physical Activity:   . Days of Exercise per Week: Not on file  . Minutes of Exercise per Session: Not on file  Stress:   . Feeling of Stress : Not on file  Social Connections:   . Frequency of Communication with Friends and Family: Not on file  . Frequency of Social Gatherings with Friends and Family: Not on file  . Attends Religious Services: Not on file  . Active Member of Clubs or Organizations: Not on file  . Attends Archivist Meetings: Not on file  . Marital Status: Not on file     Family History:  The patient's family history includes Coronary artery disease (age of onset: 17) in his brother; Coronary artery disease (age of onset: 20) in his father.   ROS:   Please see the history of present illness.    ROS All other systems are reviewed and are negative.   PHYSICAL EXAM:   VS:  BP 138/82   Pulse 70   Temp (!) 97 F (36.1 C)   Ht 6' (1.829 m)   Wt 218 lb (98.9 kg)   SpO2 96%   BMI 29.57 kg/m    GEN: Well nourished, well developed, in no acute distress  HEENT: normal  Neck: no JVD, carotid bruits, or masses Cardiac: RRR; no murmurs, rubs, or gallops,no edema , well-healed  sternotomy Respiratory:  clear to auscultation bilaterally, normal work of breathing GI: soft, nontender, nondistended, + BS MS: no deformity or atrophy  Skin: warm and dry, no rash Neuro:  Alert and Oriented x 3, Strength and sensation are intact Psych: euthymic mood, full affect  Wt Readings from Last 3 Encounters:  07/20/19 218 lb (98.9 kg)  01/09/19 210 lb (95.3 kg)  07/21/18 215 lb 12.8 oz (97.9 kg)      Studies/Labs Reviewed:   EKG:  EKG is ordered today.  The ekg ordered today demonstrates Normal sinus rhythm, normal tracing, QTC 409 ms  Recent Labs: July 2017 glucose 197, hemoglobin 13.9, creatinine 0.92, BUN 22, platelets 191 (Dr. Gerarda Fraction)  Lipid Panel    Component Value Date/Time   CHOL 150 08/28/2016 0749   TRIG 218 (H) 08/28/2016 0749   HDL 32 (L) 08/28/2016 0749   CHOLHDL 4.7 08/28/2016 0749   VLDL 44 (H) 08/28/2016 0749   LDLCALC 74 08/28/2016 0749   July 2017: LDL cholesterol 78, total cholesterol 165, but also triglycerides 244 and borderline HDL 38.  ASSESSMENT:    1. Coronary artery disease involving native coronary artery of native heart without angina pectoris   2. PAD (peripheral artery disease) ([place])   3. Non-insulin dependent type 2 diabetes mellitus ([place])   4. Mixed hyperlipidemia   5. Essential hypertension      PLAN:  In order of problems listed above: 1. CAD: Asymptomatic 3 years after bypass surgery.  His only antianginal is a low-dose of metoprolol.  He has not required nitroglycerin.  Continue aspirin, clopidogrel, statin. 2. PAD: Asymptomatic, mostly infrapopliteal disease. 3. DM: Very well controlled.  We talked about the improved long-term prognosis if we can avoid using medications that increase insulin levels (such as his glimepiride).  I think he would be able to achieve this with additional weight loss and more regular  physical activity.  Suggested trying to get his waistline down to 34 inches and weight to 205 pounds.  If this is an  unsuccessful attempt, would suggest switching the glimepiride to a SGLT2 inhibitor such as Jardiance/Farxiga and/or GLP-1 agonist such as Trulicity or Ozempic.  These would actually improve his long-term outlook with lower chance of future revascularization, myocardial infarction and congestive heart failure. 4. HLP: I think he is on the appropriate dose of statin.  If he lost weight we could probably stop his fenofibrate as well. 5. HTN: Well-controlled, typically blood pressure is less than 130/80.   Medication Adjustments/Labs and Tests Ordered: Current medicines are reviewed at length with the patient today.  Concerns regarding medicines are outlined above.  Medication changes, Labs and Tests ordered today are listed in the Patient Instructions below. Patient Instructions  Medication Instructions:  No changes *If you need a refill on your cardiac medications before your next appointment, please call your pharmacy*  Lab Work: None ordered If you have labs (blood work) drawn today and your tests are completely normal, you will receive your results only by: Marland Kitchen MyChart Message (if you have MyChart) OR . A paper copy in the mail If you have any lab test that is abnormal or we need to change your treatment, we will call you to review the results.  Testing/Procedures: None ordered  Follow-Up: At [facility], you and your health needs are our priority.  As part of our continuing mission to provide you with exceptional heart care, we have created designated Provider Care Teams.  These Care Teams include your primary Cardiologist (physician) and Advanced Practice Providers (APPs -  Physician Assistants and Nurse Practitioners) who all work together to provide you with the care you need, when you need it.  Your next appointment:   12 month(s)  The format for your next appointment:   In Person  Provider:   You may see Sanda Klein, MD or one of the following Advanced Practice Providers on  your designated Care Team:    Almyra Deforest, PA-C  Fabian Sharp, [place] or   Roby Lofts, PA-C      Signed, Sanda Klein, MD  07/20/2019 11:52 AM    [address], [place], [place]  [address] Phone: [phone]; Fax: [phone]

## 2019-07-20 NOTE — Patient Instructions (Signed)
Medication Instructions:  No changes *If you need a refill on your cardiac medications before your next appointment, please call your pharmacy*  Lab Work: None ordered If you have labs (blood work) drawn today and your tests are completely normal, you will receive your results only by: . MyChart Message (if you have MyChart) OR . A paper copy in the mail If you have any lab test that is abnormal or we need to change your treatment, we will call you to review the results.  Testing/Procedures: None ordered  Follow-Up: At [facility], you and your health needs are our priority.  As part of our continuing mission to provide you with exceptional heart care, we have created designated Provider Care Teams.  These Care Teams include your primary Cardiologist (physician) and Advanced Practice Providers (APPs -  Physician Assistants and Nurse Practitioners) who all work together to provide you with the care you need, when you need it.  Your next appointment:   12 month(s)  The format for your next appointment:   In Person  Provider:   You may see Mihai Croitoru, MD or one of the following Advanced Practice Providers on your designated Care Team:    Hao Meng, PA-C  Angela Duke, PA-C or   Krista Kroeger, PA-C  

## 2019-08-08 DIAGNOSIS — G894 Chronic pain syndrome: Secondary | ICD-10-CM | POA: Diagnosis not present

## 2019-11-01 DIAGNOSIS — N182 Chronic kidney disease, stage 2 (mild): Secondary | ICD-10-CM | POA: Diagnosis not present

## 2019-11-01 DIAGNOSIS — Z1389 Encounter for screening for other disorder: Secondary | ICD-10-CM | POA: Diagnosis not present

## 2019-11-01 DIAGNOSIS — I2510 Atherosclerotic heart disease of native coronary artery without angina pectoris: Secondary | ICD-10-CM | POA: Diagnosis not present

## 2019-11-01 DIAGNOSIS — E6609 Other obesity due to excess calories: Secondary | ICD-10-CM | POA: Diagnosis not present

## 2019-11-01 DIAGNOSIS — Z6831 Body mass index (BMI) 31.0-31.9, adult: Secondary | ICD-10-CM | POA: Diagnosis not present

## 2019-11-01 DIAGNOSIS — E119 Type 2 diabetes mellitus without complications: Secondary | ICD-10-CM | POA: Diagnosis not present

## 2019-11-01 DIAGNOSIS — E7849 Other hyperlipidemia: Secondary | ICD-10-CM | POA: Diagnosis not present

## 2019-11-01 DIAGNOSIS — K219 Gastro-esophageal reflux disease without esophagitis: Secondary | ICD-10-CM | POA: Diagnosis not present

## 2019-11-01 DIAGNOSIS — I10 Essential (primary) hypertension: Secondary | ICD-10-CM | POA: Diagnosis not present

## 2019-11-01 DIAGNOSIS — Z0000 Encounter for general adult medical examination without abnormal findings: Secondary | ICD-10-CM | POA: Diagnosis not present

## 2019-11-01 DIAGNOSIS — E1129 Type 2 diabetes mellitus with other diabetic kidney complication: Secondary | ICD-10-CM | POA: Diagnosis not present

## 2019-11-28 DIAGNOSIS — G894 Chronic pain syndrome: Secondary | ICD-10-CM | POA: Diagnosis not present

## 2019-11-28 DIAGNOSIS — N182 Chronic kidney disease, stage 2 (mild): Secondary | ICD-10-CM | POA: Diagnosis not present

## 2019-11-28 DIAGNOSIS — E1129 Type 2 diabetes mellitus with other diabetic kidney complication: Secondary | ICD-10-CM | POA: Diagnosis not present

## 2019-11-28 DIAGNOSIS — M1991 Primary osteoarthritis, unspecified site: Secondary | ICD-10-CM | POA: Diagnosis not present

## 2020-01-12 ENCOUNTER — Other Ambulatory Visit: Payer: Self-pay | Admitting: Cardiovascular Disease

## 2020-01-15 ENCOUNTER — Other Ambulatory Visit: Payer: Self-pay

## 2020-01-15 ENCOUNTER — Ambulatory Visit
Admission: EM | Admit: 2020-01-15 | Discharge: 2020-01-15 | Disposition: A | Discharge status: Home / Self Care | Payer: Medicare HMO | Attending: Emergency Medicine | Admitting: Emergency Medicine

## 2020-01-15 DIAGNOSIS — Z1152 Encounter for screening for COVID-19: Secondary | ICD-10-CM

## 2020-01-15 DIAGNOSIS — J069 Acute upper respiratory infection, unspecified: Secondary | ICD-10-CM

## 2020-01-15 MED ORDER — BENZONATATE 100 MG PO CAPS: 100.0000 mg | ORAL_CAPSULE | Freq: Three times a day (TID) | ORAL | 0 refills | Status: DC

## 2020-01-15 MED ORDER — PREDNISONE 10 MG (21) PO TBPK: ORAL_TABLET | ORAL | 0 refills | Status: DC

## 2020-01-15 MED ORDER — CEPACOL REGULAR STRENGTH 3 MG MT LOZG: 1.0000 | LOZENGE | OROMUCOSAL | 2 refills | Status: DC | PRN

## 2020-01-15 NOTE — ED Provider Notes (Addendum)
[place]   [id number] 01/15/20 Arrival Time: 4268   CC: COVID symptoms  SUBJECTIVE: History from: patient  Jonathan Shepard is a 69 y.o. male who presents to the urgent care for complaint of sore throat, nasal congestion, postnasal drip and mild cough for the past few days.  Denies sick exposure to COVID, flu or strep.  Denies recent travel.  Has tried OTC Zyrtec with relief.  Reports currently taking amoxicillin antibiotic.  Denies aggravating factors.  Denies previous symptoms in the past.   Denies fever, chills, fatigue, sinus pain, rhinorrhea,  SOB, wheezing, chest pain, nausea, changes in bowel or bladder habits.     ROS: As per HPI.  All other pertinent ROS negative.      Past Medical History:  Diagnosis Date  . CAD (coronary artery disease)   . Diabetes ([place])   . Dyspnea   . GERD (gastroesophageal reflux disease)   . Hyperlipidemia   . Hypertension   . Joint pain    hand, related to work  . Obesity    Past Surgical History:  Procedure Laterality Date  . CARDIAC CATHETERIZATION N/A 04/14/2016   Procedure: Left Heart Cath and Coronary Angiography;  Surgeon: Peter M Martinique, MD;  Location: [facility];  Service: Cardiovascular;  Laterality: N/A;  . COLONOSCOPY WITH PROPOFOL N/A 01/09/2019   Procedure: COLONOSCOPY WITH PROPOFOL;  Surgeon: Daneil Dolin, MD;  [facility];  Service: Endoscopy;  Laterality: N/A;  8:30am  . CORONARY ARTERY BYPASS GRAFT N/A 04/22/2016   Procedure: CORONARY ARTERY BYPASS GRAFTING (CABG)x4 with endoscopic harvesting of right saphenous vein SVG to diagonal SVG to PDA Left Radial artery to OM 2 LIMA to LAD;  Surgeon: Melrose Nakayama, MD;  Location: [facility];  Service: Open Heart Surgery;  Laterality: N/A;  . RADIAL ARTERY HARVEST Left 04/22/2016   Procedure: LEFT RADIAL ARTERY HARVEST;  Surgeon: Melrose Nakayama, MD;  Location: [facility];  Service: Open Heart Surgery;  Laterality: Left;  . TEE WITHOUT CARDIOVERSION  N/A 04/22/2016   Procedure: TRANSESOPHAGEAL ECHOCARDIOGRAM (TEE);  Surgeon: Melrose Nakayama, MD;  Location: [facility];  Service: Open Heart Surgery;  Laterality: N/A;   No Known Allergies No current facility-administered medications on file prior to encounter.   Current Outpatient Medications on File Prior to Encounter  Medication Sig Dispense Refill  . ACCU-CHEK AVIVA PLUS test strip     . aspirin EC 81 MG tablet Take 81 mg by mouth every morning.     . clopidogrel (PLAVIX) 75 MG tablet Take 1 tablet (75 mg total) by mouth daily. 90 tablet 0  . Co-Enzyme Q-10 100 MG CAPS Take 100 mg by mouth daily.     . diclofenac (VOLTAREN) 75 MG EC tablet Take 75 mg by mouth 2 (two) times daily.     . fenofibrate 54 MG tablet TAKE 1 TABLET EVERY DAY (Patient taking differently: Take 54 mg by mouth daily. ) 90 tablet 1  . glimepiride (AMARYL) 2 MG tablet Take 2 mg by mouth 2 (two) times daily.    Marland Kitchen HYDROcodone-acetaminophen (NORCO) 10-325 MG tablet Take 1 tablet by mouth every 6 (six) hours as needed for moderate pain.   0  . hydrOXYzine (ATARAX/VISTARIL) 25 MG tablet     . metFORMIN (GLUCOPHAGE) 1000 MG tablet Take 1,000 mg by mouth 2 (two) times daily with a meal.    . metoprolol tartrate (LOPRESSOR) 25 MG tablet Take 1 tablet (25 mg total) by mouth 2 (  two) times daily. 180 tablet 0  . Multiple Vitamin (MULTIVITAMIN) tablet Take 1 tablet by mouth daily.    . Omega-3 Fatty Acids (FISH OIL) 1200 MG CAPS Take 1,200 mg by mouth 3 (three) times daily. Take 2400 mg by mouth in the morning and 1200 mg in the evening    . Oxycodone HCl 10 MG TABS Take 10 mg by mouth daily as needed (breakthrough pain).     . pantoprazole (PROTONIX) 40 MG tablet TAKE 1 TABLET EVERY DAY (Patient taking differently: Take 40 mg by mouth daily. ) 90 tablet 1  . pravastatin (PRAVACHOL) 80 MG tablet TAKE 1 TABLET EVERY DAY (NEED MD APPOINTMENT) 90 tablet 2  . ramipril (ALTACE) 5 MG capsule Take 1 capsule (5 mg total) by mouth daily.  30 capsule 0  . sildenafil (VIAGRA) 100 MG tablet     . valACYclovir (VALTREX) 1000 MG tablet      Social History   Socioeconomic History  . Marital status: Married    Spouse name: Not on file  . Number of children: Not on file  . Years of education: Not on file  . Highest education level: Not on file  Occupational History  . 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 Smoking status: Never Smoker  . Smokeless tobacco: Never Used  Substance and Sexual Activity  . Alcohol use: No  . Drug use: No  . Sexual activity: Not on file  Other Topics Concern  . Not on file  Social History Narrative   Has 3 children   Has 2 grandchildren   Social Determinants of Health   Financial Resource Strain:   . Difficulty of Paying Living Expenses:   Food Insecurity:   . Worried About Charity fundraiser in the Last Year:   . Arboriculturist in the Last Year:   Transportation Needs:   . Film/video editor (Medical):   Marland Kitchen Lack of Transportation (Non-Medical):   Physical Activity:   . Days of Exercise per Week:   . Minutes of Exercise per Session:   Stress:   . Feeling of Stress :   Social Connections:   . Frequency of Communication with Friends and Family:   . Frequency of Social Gatherings with Friends and Family:   . Attends Religious Services:   . Active Member of Clubs or Organizations:   . Attends Archivist Meetings:   Marland Kitchen Marital Status:   Intimate Partner Violence:   . Fear of Current or Ex-Partner:   . Emotionally Abused:   Marland Kitchen Physically Abused:   . Sexually Abused:    Family History  Problem Relation Age of Onset  . Coronary artery disease Father 1       CABG  . Cancer Father   . Coronary artery disease Brother 48       CABG  . Colon cancer Neg Hx   . Colon polyps Neg Hx     OBJECTIVE:  Vitals:   01/15/20 1058  BP: (!) 166/88  Pulse: 78  Resp: 20  Temp: 98.5 F (36.9 C)  SpO2: 95%     General appearance: alert;  appears fatigued, but nontoxic; speaking in full sentences and tolerating own secretions HEENT: NCAT; Ears: EACs clear, TMs pearly gray; Eyes: PERRL.  EOM grossly intact. Sinuses: nontender; Nose: nares patent without rhinorrhea, Throat: oropharynx clear, tonsils non erythematous or enlarged, uvula midline  Neck: supple without LAD Lungs: unlabored respirations,  symmetrical air entry; cough: mild; no respiratory distress; CTAB Heart: regular rate and rhythm.  Radial pulses 2+ symmetrical bilaterally Skin: warm and dry Psychological: alert and cooperative; normal mood and affect  LABS:  No results found for this or any previous visit (from the past 24 hour(s)).   ASSESSMENT & PLAN:  1. Viral URI with cough   2. Encounter for screening for COVID-19     Meds ordered this encounter  Medications  . benzonatate (TESSALON) 100 MG capsule    Sig: Take 1 capsule (100 mg total) by mouth every 8 (eight) hours.    Dispense:  30 capsule    Refill:  0  . predniSONE (STERAPRED UNI-PAK 21 TAB) 10 MG (21) TBPK tablet    Sig: Take 6 tabs by mouth daily  for 1 days, then 5 tabs for 1 days, then 4 tabs for 1days, then 3 tabs for 1 days, 2 tabs for 1 days, then 1 tab by mouth daily for 1 days    Dispense:  21 tablet    Refill:  0  . menthol-cetylpyridinium (CEPACOL REGULAR STRENGTH) 3 MG lozenge    Sig: Take 1 lozenge (3 mg total) by mouth as needed for sore throat.    Dispense:  100 tablet    Refill:  2   Discharge Instructions    COVID testing ordered.  It will take between 2-7 days for test results.  Someone will contact you regarding abnormal results.    In the meantime: You should remain isolated in your home for 10 days from symptom onset AND greater than 24 hours after symptoms resolution (absence of fever without the use of fever-reducing medication and improvement in respiratory symptoms), whichever is longer Get plenty of rest and push fluids Tessalon Perles prescribed for  cough Prednisone was prescribed  Cepacol lozenges was prescribed for sore throat Use medications daily for symptom relief Use OTC medications like ibuprofen or tylenol as needed fever or pain Call or go to the ED if you have any new or worsening symptoms such as fever, worsening cough, shortness of breath, chest tightness, chest pain, turning blue, changes in mental status, etc...   Reviewed expectations re: course of current medical issues. Questions answered. Outlined signs and symptoms indicating need for more acute intervention. Patient verbalized understanding. After Visit Summary given.      Note: This document was prepared using Dragon voice recognition software and may include unintentional dictation errors.    Emerson Monte, FNP 01/15/20 1128    Emerson Monte, FNP 01/15/20 1129

## 2020-01-15 NOTE — ED Triage Notes (Signed)
Pt presents with sore throat and nasal congestion

## 2020-01-15 NOTE — Discharge Instructions (Addendum)
COVID testing ordered.  It will take between 2-7 days for test results.  Someone will contact you regarding abnormal results.    In the meantime: You should remain isolated in your home for 10 days from symptom onset AND greater than 24 hours after symptoms resolution (absence of fever without the use of fever-reducing medication and improvement in respiratory symptoms), whichever is longer Get plenty of rest and push fluids Tessalon Perles prescribed for cough Prednisone was prescribed  Cepacol lozenges was prescribed for sore throat Use medications daily for symptom relief Use OTC medications like ibuprofen or tylenol as needed fever or pain Call or go to the ED if you have any new or worsening symptoms such as fever, worsening cough, shortness of breath, chest tightness, chest pain, turning blue, changes in mental status, etc..Marland Kitchen

## 2020-01-16 LAB — SARS-COV-2, NAA 2 DAY TAT

## 2020-01-16 LAB — NOVEL CORONAVIRUS, NAA: SARS-CoV-2, NAA: NOT DETECTED

## 2020-02-06 ENCOUNTER — Ambulatory Visit
Admission: RE | Admit: 2020-02-06 | Discharge: 2020-02-06 | Disposition: A | Discharge status: Home / Self Care | Payer: Medicare HMO | Source: Ambulatory Visit | Attending: Internal Medicine | Admitting: Internal Medicine

## 2020-02-06 ENCOUNTER — Other Ambulatory Visit: Payer: Self-pay

## 2020-02-06 VITALS — BP 168/96 | HR 80 | Temp 98.4°F | Resp 16

## 2020-02-06 DIAGNOSIS — R590 Localized enlarged lymph nodes: Secondary | ICD-10-CM | POA: Diagnosis not present

## 2020-02-06 NOTE — ED Provider Notes (Signed)
[place]   [id number] 02/06/20 Arrival Time: [place]"  SUBJECTIVE: History from: patient.  [name] is a 69 y.o. male who presents with intermittent LT sided neck pain x 1 month.  Denies sick exposure to strep, or precipitating event.  Has tried prednisone with minimal relief.  Sore to the touch.  Denies previous symptoms in the past.  Denies fever, chills, fatigue, ear pain, sinus pain, rhinorrhea, nasal congestion, cough, SOB, wheezing, chest pain, nausea, rash, changes in bowel or bladder habits.    ROS: As per HPI.  All other pertinent ROS negative.     Past Medical History:  Diagnosis Date  . CAD (coronary artery disease)   . Diabetes ([place])   . Dyspnea   . GERD (gastroesophageal reflux disease)   . Hyperlipidemia   . Hypertension   . Joint pain    hand, related to work  . Obesity    Past Surgical History:  Procedure Laterality Date  . CARDIAC CATHETERIZATION N/A 04/14/2016   Procedure: Left Heart Cath and Coronary Angiography;  Surgeon: Peter M Martinique, MD;  Location: [facility];  Service: Cardiovascular;  Laterality: N/A;  . COLONOSCOPY WITH PROPOFOL N/A 01/09/2019   Procedure: COLONOSCOPY WITH PROPOFOL;  Surgeon: Daneil Dolin, MD;  [facility];  Service: Endoscopy;  Laterality: N/A;  8:30am  . CORONARY ARTERY BYPASS GRAFT N/A 04/22/2016   Procedure: CORONARY ARTERY BYPASS GRAFTING (CABG)x4 with endoscopic harvesting of right saphenous vein SVG to diagonal SVG to PDA Left Radial artery to OM 2 LIMA to LAD;  Surgeon: Melrose Nakayama, MD;  Location: [facility];  Service: Open Heart Surgery;  Laterality: N/A;  . RADIAL ARTERY HARVEST Left 04/22/2016   Procedure: LEFT RADIAL ARTERY HARVEST;  Surgeon: Melrose Nakayama, MD;  Location: [facility];  Service: Open Heart Surgery;  Laterality: Left;  . TEE WITHOUT CARDIOVERSION N/A 04/22/2016   Procedure: TRANSESOPHAGEAL ECHOCARDIOGRAM (TEE);  Surgeon: Melrose Nakayama, MD;   Location: [facility];  Service: Open Heart Surgery;  Laterality: N/A;   No Known Allergies No current facility-administered medications on file prior to encounter.   Current Outpatient Medications on File Prior to Encounter  Medication Sig Dispense Refill  . ACCU-CHEK AVIVA PLUS test strip     . aspirin EC 81 MG tablet Take 81 mg by mouth every morning.     . benzonatate (TESSALON) 100 MG capsule Take 1 capsule (100 mg total) by mouth every 8 (eight) hours. 30 capsule 0  . clopidogrel (PLAVIX) 75 MG tablet Take 1 tablet (75 mg total) by mouth daily. 90 tablet 0  . Co-Enzyme Q-10 100 MG CAPS Take 100 mg by mouth daily.     . diclofenac (VOLTAREN) 75 MG EC tablet Take 75 mg by mouth 2 (two) times daily.     . fenofibrate 54 MG tablet TAKE 1 TABLET EVERY DAY (Patient taking differently: Take 54 mg by mouth daily. ) 90 tablet 1  . glimepiride (AMARYL) 2 MG tablet Take 2 mg by mouth 2 (two) times daily.    Marland Kitchen HYDROcodone-acetaminophen (NORCO) 10-325 MG tablet Take 1 tablet by mouth every 6 (six) hours as needed for moderate pain.   0  . hydrOXYzine (ATARAX/VISTARIL) 25 MG tablet     . menthol-cetylpyridinium (CEPACOL REGULAR STRENGTH) 3 MG lozenge Take 1 lozenge (3 mg total) by mouth as needed for sore throat. 100 tablet 2  . metFORMIN (GLUCOPHAGE) 1000 MG tablet Take 1,000 mg by mouth  2 (two) times daily with a meal.    . metoprolol tartrate (LOPRESSOR) 25 MG tablet Take 1 tablet (25 mg total) by mouth 2 (two) times daily. 180 tablet 0  . Multiple Vitamin (MULTIVITAMIN) tablet Take 1 tablet by mouth daily.    . Omega-3 Fatty Acids (FISH OIL) 1200 MG CAPS Take 1,200 mg by mouth 3 (three) times daily. Take 2400 mg by mouth in the morning and 1200 mg in the evening    . Oxycodone HCl 10 MG TABS Take 10 mg by mouth daily as needed (breakthrough pain).     . pantoprazole (PROTONIX) 40 MG tablet TAKE 1 TABLET EVERY DAY (Patient taking differently: Take 40 mg by mouth daily. ) 90 tablet 1  . pravastatin  (PRAVACHOL) 80 MG tablet TAKE 1 TABLET EVERY DAY (NEED MD APPOINTMENT) 90 tablet 2  . predniSONE (STERAPRED UNI-PAK 21 TAB) 10 MG (21) TBPK tablet Take 6 tabs by mouth daily  for 1 days, then 5 tabs for 1 days, then 4 tabs for 1days, then 3 tabs for 1 days, 2 tabs for 1 days, then 1 tab by mouth daily for 1 days 21 tablet 0  . ramipril (ALTACE) 5 MG capsule Take 1 capsule (5 mg total) by mouth daily. 30 capsule 0  . sildenafil (VIAGRA) 100 MG tablet     . valACYclovir (VALTREX) 1000 MG tablet      Social History   Socioeconomic History  . Marital status: Married    Spouse name: Not on file  . Number of children: Not on file  . Years of education: Not on file  . Highest education level: Not on file  Occupational History  . 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 Smoking status: Never Smoker  . Smokeless tobacco: Never Used  Substance and Sexual Activity  . Alcohol use: No  . Drug use: No  . Sexual activity: Not on file  Other Topics Concern  . Not on file  Social History Narrative   Has 3 children   Has 2 grandchildren   Social Determinants of Health   Financial Resource Strain:   . Difficulty of Paying Living Expenses: Not on file  Food Insecurity:   . Worried About Charity fundraiser in the Last Year: Not on file  . Ran Out of Food in the Last Year: Not on file  Transportation Needs:   . Lack of Transportation (Medical): Not on file  . Lack of Transportation (Non-Medical): Not on file  Physical Activity:   . Days of Exercise per Week: Not on file  . Minutes of Exercise per Session: Not on file  Stress:   . Feeling of Stress : Not on file  Social Connections:   . Frequency of Communication with Friends and Family: Not on file  . Frequency of Social Gatherings with Friends and Family: Not on file  . Attends Religious Services: Not on file  . Active Member of Clubs or Organizations: Not on file  . Attends Archivist Meetings:  Not on file  . Marital Status: Not on file  Intimate Partner Violence:   . Fear of Current or Ex-Partner: Not on file  . Emotionally Abused: Not on file  . Physically Abused: Not on file  . Sexually Abused: Not on file   Family History  Problem Relation Age of Onset  . Coronary artery disease Father 68       CABG  .  Cancer Father   . Coronary artery disease Brother 92       CABG  . Colon cancer Neg Hx   . Colon polyps Neg Hx     OBJECTIVE:  Vitals:   02/06/20 1531  BP: (!) 168/96  Pulse: 80  Resp: 16  Temp: 98.4 F (36.9 C)  TempSrc: Tympanic  SpO2: 94%     General appearance: alert; well-appearing, nontoxic; speaking in full sentences and tolerating own secretions HEENT: NCAT; Ears: EACs clear, TMs pearly gray; Eyes: PERRL.  EOM grossly intact. Nose: nares patent without rhinorrhea, Throat: oropharynx clear, tonsils non erythematous or enlarged, uvula midline  Neck: palpable 1-2 cm lymph node to RT submandibular region, mildly TTP, freely moveable Lungs: unlabored respirations, symmetrical air entry; cough: absent; no respiratory distress; CTAB Heart: regular rate and rhythm.  Skin: warm and dry Psychological: alert and cooperative; normal mood and affect   ASSESSMENT & PLAN:  1. Lymphadenopathy, submandibular    Exam positive for RT side lymph node swelling, otherwise exam and vital signs are within normal limits Push fluids and get rest Take OTC ibuprofen or tylenol as needed for pain Follow up with PCP for further evaluation and management.  You may benefit from blood work and additional imaging.   Return or go to ER if patient has any new or worsening symptoms such as fever, chills, nausea, vomiting, worsening sore throat, cough, abdominal pain, chest pain, changes in bowel or bladder habits, etc...  Reviewed expectations re: course of current medical issues. Questions answered. Outlined signs and symptoms indicating need for more acute intervention. Patient  verbalized understanding. After Visit Summary given.        Lestine Box, PA-C 02/06/20 1542

## 2020-02-06 NOTE — Discharge Instructions (Signed)
Exam positive for RT side lymph node swelling, otherwise exam and vital signs are within normal limits Push fluids and get rest Take OTC ibuprofen or tylenol as needed for pain Follow up with PCP for further evaluation and management.  You may benefit from blood work and additional imaging.   Return or go to ER if patient has any new or worsening symptoms such as fever, chills, nausea, vomiting, worsening sore throat, cough, abdominal pain, chest pain, changes in bowel or bladder habits, etc..Marland Kitchen

## 2020-02-07 DIAGNOSIS — E6609 Other obesity due to excess calories: Secondary | ICD-10-CM | POA: Diagnosis not present

## 2020-02-07 DIAGNOSIS — R070 Pain in throat: Secondary | ICD-10-CM | POA: Diagnosis not present

## 2020-02-07 DIAGNOSIS — Z6831 Body mass index (BMI) 31.0-31.9, adult: Secondary | ICD-10-CM | POA: Diagnosis not present

## 2020-02-07 DIAGNOSIS — Z1159 Encounter for screening for other viral diseases: Secondary | ICD-10-CM | POA: Diagnosis not present

## 2020-02-08 ENCOUNTER — Other Ambulatory Visit: Payer: Self-pay | Admitting: Physician Assistant

## 2020-02-08 ENCOUNTER — Other Ambulatory Visit (HOSPITAL_COMMUNITY): Payer: Self-pay | Admitting: Physician Assistant

## 2020-02-08 DIAGNOSIS — R070 Pain in throat: Secondary | ICD-10-CM

## 2020-02-13 ENCOUNTER — Ambulatory Visit (HOSPITAL_COMMUNITY)
Admission: RE | Admit: 2020-02-13 | Discharge: 2020-02-13 | Disposition: A | Discharge status: Home / Self Care | Payer: Medicare HMO | Source: Ambulatory Visit | Attending: Physician Assistant | Admitting: Physician Assistant

## 2020-02-13 ENCOUNTER — Other Ambulatory Visit: Payer: Self-pay

## 2020-02-13 DIAGNOSIS — R070 Pain in throat: Secondary | ICD-10-CM | POA: Diagnosis not present

## 2020-02-13 DIAGNOSIS — R590 Localized enlarged lymph nodes: Secondary | ICD-10-CM | POA: Diagnosis not present

## 2020-02-16 DIAGNOSIS — L4050 Arthropathic psoriasis, unspecified: Secondary | ICD-10-CM | POA: Diagnosis not present

## 2020-02-16 DIAGNOSIS — G894 Chronic pain syndrome: Secondary | ICD-10-CM | POA: Diagnosis not present

## 2020-02-16 DIAGNOSIS — N182 Chronic kidney disease, stage 2 (mild): Secondary | ICD-10-CM | POA: Diagnosis not present

## 2020-02-16 DIAGNOSIS — K219 Gastro-esophageal reflux disease without esophagitis: Secondary | ICD-10-CM | POA: Diagnosis not present

## 2020-03-06 DIAGNOSIS — J330 Polyp of nasal cavity: Secondary | ICD-10-CM | POA: Diagnosis not present

## 2020-03-06 DIAGNOSIS — H6121 Impacted cerumen, right ear: Secondary | ICD-10-CM | POA: Diagnosis not present

## 2020-03-06 DIAGNOSIS — K219 Gastro-esophageal reflux disease without esophagitis: Secondary | ICD-10-CM | POA: Diagnosis not present

## 2020-03-06 DIAGNOSIS — R070 Pain in throat: Secondary | ICD-10-CM | POA: Diagnosis not present

## 2020-04-11 DIAGNOSIS — M1991 Primary osteoarthritis, unspecified site: Secondary | ICD-10-CM | POA: Diagnosis not present

## 2020-04-11 DIAGNOSIS — E1129 Type 2 diabetes mellitus with other diabetic kidney complication: Secondary | ICD-10-CM | POA: Diagnosis not present

## 2020-04-11 DIAGNOSIS — Z681 Body mass index (BMI) 19 or less, adult: Secondary | ICD-10-CM | POA: Diagnosis not present

## 2020-04-11 DIAGNOSIS — G894 Chronic pain syndrome: Secondary | ICD-10-CM | POA: Diagnosis not present

## 2020-04-11 DIAGNOSIS — N182 Chronic kidney disease, stage 2 (mild): Secondary | ICD-10-CM | POA: Diagnosis not present

## 2020-04-25 DIAGNOSIS — Z0100 Encounter for examination of eyes and vision without abnormal findings: Secondary | ICD-10-CM | POA: Diagnosis not present

## 2020-04-25 DIAGNOSIS — H5200 Hypermetropia, unspecified eye: Secondary | ICD-10-CM | POA: Diagnosis not present

## 2020-06-04 DIAGNOSIS — G894 Chronic pain syndrome: Secondary | ICD-10-CM | POA: Diagnosis not present

## 2020-06-04 DIAGNOSIS — E669 Obesity, unspecified: Secondary | ICD-10-CM | POA: Diagnosis not present

## 2020-06-04 DIAGNOSIS — I10 Essential (primary) hypertension: Secondary | ICD-10-CM | POA: Diagnosis not present

## 2020-08-19 DIAGNOSIS — Z6832 Body mass index (BMI) 32.0-32.9, adult: Secondary | ICD-10-CM | POA: Diagnosis not present

## 2020-08-19 DIAGNOSIS — G894 Chronic pain syndrome: Secondary | ICD-10-CM | POA: Diagnosis not present

## 2020-08-19 DIAGNOSIS — E119 Type 2 diabetes mellitus without complications: Secondary | ICD-10-CM | POA: Diagnosis not present

## 2020-08-19 DIAGNOSIS — M1991 Primary osteoarthritis, unspecified site: Secondary | ICD-10-CM | POA: Diagnosis not present

## 2020-08-19 DIAGNOSIS — E1129 Type 2 diabetes mellitus with other diabetic kidney complication: Secondary | ICD-10-CM | POA: Diagnosis not present

## 2020-08-19 DIAGNOSIS — I10 Essential (primary) hypertension: Secondary | ICD-10-CM | POA: Diagnosis not present

## 2020-08-23 ENCOUNTER — Other Ambulatory Visit: Payer: Self-pay | Admitting: Cardiovascular Disease

## 2020-09-19 DIAGNOSIS — G894 Chronic pain syndrome: Secondary | ICD-10-CM | POA: Diagnosis not present

## 2020-09-19 DIAGNOSIS — M1991 Primary osteoarthritis, unspecified site: Secondary | ICD-10-CM | POA: Diagnosis not present

## 2020-09-19 DIAGNOSIS — L4050 Arthropathic psoriasis, unspecified: Secondary | ICD-10-CM | POA: Diagnosis not present

## 2020-10-17 ENCOUNTER — Other Ambulatory Visit: Payer: Self-pay | Admitting: Cardiovascular Disease

## 2020-10-23 DIAGNOSIS — G894 Chronic pain syndrome: Secondary | ICD-10-CM | POA: Diagnosis not present

## 2020-11-15 DIAGNOSIS — I10 Essential (primary) hypertension: Secondary | ICD-10-CM | POA: Diagnosis not present

## 2020-11-15 DIAGNOSIS — D649 Anemia, unspecified: Secondary | ICD-10-CM | POA: Diagnosis not present

## 2020-11-15 DIAGNOSIS — N419 Inflammatory disease of prostate, unspecified: Secondary | ICD-10-CM | POA: Diagnosis not present

## 2020-11-15 DIAGNOSIS — Z6831 Body mass index (BMI) 31.0-31.9, adult: Secondary | ICD-10-CM | POA: Diagnosis not present

## 2020-11-15 DIAGNOSIS — E6609 Other obesity due to excess calories: Secondary | ICD-10-CM | POA: Diagnosis not present

## 2020-11-15 DIAGNOSIS — E538 Deficiency of other specified B group vitamins: Secondary | ICD-10-CM | POA: Diagnosis not present

## 2020-11-15 DIAGNOSIS — Z0000 Encounter for general adult medical examination without abnormal findings: Secondary | ICD-10-CM | POA: Diagnosis not present

## 2020-11-15 DIAGNOSIS — R300 Dysuria: Secondary | ICD-10-CM | POA: Diagnosis not present

## 2020-11-15 DIAGNOSIS — N3090 Cystitis, unspecified without hematuria: Secondary | ICD-10-CM | POA: Diagnosis not present

## 2020-11-15 DIAGNOSIS — E7849 Other hyperlipidemia: Secondary | ICD-10-CM | POA: Diagnosis not present

## 2020-11-15 DIAGNOSIS — Z1389 Encounter for screening for other disorder: Secondary | ICD-10-CM | POA: Diagnosis not present

## 2020-11-15 DIAGNOSIS — E119 Type 2 diabetes mellitus without complications: Secondary | ICD-10-CM | POA: Diagnosis not present

## 2021-01-07 DIAGNOSIS — D649 Anemia, unspecified: Secondary | ICD-10-CM | POA: Diagnosis not present

## 2021-01-07 DIAGNOSIS — L4050 Arthropathic psoriasis, unspecified: Secondary | ICD-10-CM | POA: Diagnosis not present

## 2021-01-07 DIAGNOSIS — G894 Chronic pain syndrome: Secondary | ICD-10-CM | POA: Diagnosis not present

## 2021-01-19 ENCOUNTER — Other Ambulatory Visit: Payer: Self-pay | Admitting: Cardiovascular Disease

## 2021-02-24 DIAGNOSIS — E1129 Type 2 diabetes mellitus with other diabetic kidney complication: Secondary | ICD-10-CM | POA: Diagnosis not present

## 2021-02-24 DIAGNOSIS — G894 Chronic pain syndrome: Secondary | ICD-10-CM | POA: Diagnosis not present

## 2021-02-24 DIAGNOSIS — M1991 Primary osteoarthritis, unspecified site: Secondary | ICD-10-CM | POA: Diagnosis not present

## 2021-04-02 DIAGNOSIS — J329 Chronic sinusitis, unspecified: Secondary | ICD-10-CM | POA: Diagnosis not present

## 2021-04-03 DIAGNOSIS — D649 Anemia, unspecified: Secondary | ICD-10-CM | POA: Diagnosis not present

## 2021-04-03 DIAGNOSIS — L4050 Arthropathic psoriasis, unspecified: Secondary | ICD-10-CM | POA: Diagnosis not present

## 2021-04-03 DIAGNOSIS — G894 Chronic pain syndrome: Secondary | ICD-10-CM | POA: Diagnosis not present

## 2021-04-09 DIAGNOSIS — I10 Essential (primary) hypertension: Secondary | ICD-10-CM | POA: Diagnosis not present

## 2021-04-09 DIAGNOSIS — Z23 Encounter for immunization: Secondary | ICD-10-CM | POA: Diagnosis not present

## 2021-04-09 DIAGNOSIS — G894 Chronic pain syndrome: Secondary | ICD-10-CM | POA: Diagnosis not present

## 2021-04-09 DIAGNOSIS — M1991 Primary osteoarthritis, unspecified site: Secondary | ICD-10-CM | POA: Diagnosis not present

## 2021-04-09 DIAGNOSIS — R5383 Other fatigue: Secondary | ICD-10-CM | POA: Diagnosis not present

## 2021-04-09 DIAGNOSIS — Z6831 Body mass index (BMI) 31.0-31.9, adult: Secondary | ICD-10-CM | POA: Diagnosis not present

## 2021-04-09 DIAGNOSIS — E1165 Type 2 diabetes mellitus with hyperglycemia: Secondary | ICD-10-CM | POA: Diagnosis not present

## 2021-05-13 DIAGNOSIS — Z0100 Encounter for examination of eyes and vision without abnormal findings: Secondary | ICD-10-CM | POA: Diagnosis not present

## 2021-05-13 DIAGNOSIS — H52229 Regular astigmatism, unspecified eye: Secondary | ICD-10-CM | POA: Diagnosis not present

## 2021-05-19 DIAGNOSIS — I10 Essential (primary) hypertension: Secondary | ICD-10-CM | POA: Diagnosis not present

## 2021-05-19 DIAGNOSIS — M1991 Primary osteoarthritis, unspecified site: Secondary | ICD-10-CM | POA: Diagnosis not present

## 2021-05-19 DIAGNOSIS — R5383 Other fatigue: Secondary | ICD-10-CM | POA: Diagnosis not present

## 2021-05-19 DIAGNOSIS — G894 Chronic pain syndrome: Secondary | ICD-10-CM | POA: Diagnosis not present

## 2021-06-05 ENCOUNTER — Telehealth: Payer: Self-pay | Admitting: Cardiovascular Disease

## 2021-06-05 ENCOUNTER — Other Ambulatory Visit: Payer: Self-pay | Admitting: Cardiovascular Disease

## 2021-06-05 MED ORDER — PRAVASTATIN SODIUM 80 MG PO TABS: 80.0000 mg | ORAL_TABLET | Freq: Every day | ORAL | 1 refills | Status: DC

## 2021-06-05 NOTE — Telephone Encounter (Signed)
°*  STAT* If patient is at the pharmacy, call can be transferred to refill team.   1. Which medications need to be refilled? (please list name of each medication and dose if known)  pravastatin (PRAVACHOL) 80 MG tablet  2. Which pharmacy/location (including street and city if local pharmacy) is medication to be sent to? [facility], [place]  3. Do they need a 30 day or 90 day supply? 90 day refill   PT HAS BEEN OUT OF THIS MEDICINE FOR A FEW DAYS  PT HAS AN UPCOMING APPT WITH [facility] 09/2021

## 2021-06-05 NOTE — Telephone Encounter (Signed)
Message sent  patient - medication refilled

## 2021-06-05 NOTE — Telephone Encounter (Signed)
Medication refilled  x1  for 90 day supply. Patient has not seen Dr Sallyanne Kuster since 07/2019.  Patient has upcoming appointment schedule for 4 /2023 .  Message placed on patient medication bottle.

## 2021-06-06 MED ORDER — PRAVASTATIN SODIUM 80 MG PO TABS: 80.0000 mg | ORAL_TABLET | Freq: Every day | ORAL | 1 refills | Status: DC

## 2021-06-08 HISTORY — PX: EYE SURGERY: SHX253

## 2021-06-17 DIAGNOSIS — G4730 Sleep apnea, unspecified: Secondary | ICD-10-CM | POA: Diagnosis not present

## 2021-07-04 DIAGNOSIS — G4733 Obstructive sleep apnea (adult) (pediatric): Secondary | ICD-10-CM | POA: Diagnosis not present

## 2021-07-24 DIAGNOSIS — D649 Anemia, unspecified: Secondary | ICD-10-CM | POA: Diagnosis not present

## 2021-08-19 DIAGNOSIS — I10 Essential (primary) hypertension: Secondary | ICD-10-CM | POA: Diagnosis not present

## 2021-08-19 DIAGNOSIS — G894 Chronic pain syndrome: Secondary | ICD-10-CM | POA: Diagnosis not present

## 2021-08-19 DIAGNOSIS — G4733 Obstructive sleep apnea (adult) (pediatric): Secondary | ICD-10-CM | POA: Diagnosis not present

## 2021-08-19 DIAGNOSIS — L4050 Arthropathic psoriasis, unspecified: Secondary | ICD-10-CM | POA: Diagnosis not present

## 2021-08-19 DIAGNOSIS — M5412 Radiculopathy, cervical region: Secondary | ICD-10-CM | POA: Diagnosis not present

## 2021-08-19 DIAGNOSIS — E1165 Type 2 diabetes mellitus with hyperglycemia: Secondary | ICD-10-CM | POA: Diagnosis not present

## 2021-08-19 DIAGNOSIS — Z6831 Body mass index (BMI) 31.0-31.9, adult: Secondary | ICD-10-CM | POA: Diagnosis not present

## 2021-08-21 ENCOUNTER — Other Ambulatory Visit (HOSPITAL_COMMUNITY): Payer: Self-pay | Admitting: Internal Medicine

## 2021-09-03 ENCOUNTER — Encounter: Payer: Self-pay | Admitting: Emergency Medicine

## 2021-09-03 ENCOUNTER — Ambulatory Visit
Admission: EM | Admit: 2021-09-03 | Discharge: 2021-09-03 | Disposition: A | Discharge status: Home / Self Care | Payer: Medicare HMO | Attending: Family Medicine | Admitting: Family Medicine

## 2021-09-03 DIAGNOSIS — J3089 Other allergic rhinitis: Secondary | ICD-10-CM | POA: Diagnosis not present

## 2021-09-03 DIAGNOSIS — J209 Acute bronchitis, unspecified: Secondary | ICD-10-CM | POA: Diagnosis not present

## 2021-09-03 MED ORDER — PROMETHAZINE-DM 6.25-15 MG/5ML PO SYRP: 5.0000 mL | ORAL_SOLUTION | Freq: Four times a day (QID) | ORAL | 0 refills | Status: DC | PRN

## 2021-09-03 MED ORDER — FLUTICASONE PROPIONATE 50 MCG/ACT NA SUSP: 1.0000 | Freq: Two times a day (BID) | NASAL | 2 refills | Status: DC

## 2021-09-03 MED ORDER — PREDNISONE 20 MG PO TABS: 40.0000 mg | ORAL_TABLET | Freq: Every day | ORAL | 0 refills | Status: DC

## 2021-09-03 NOTE — ED Triage Notes (Signed)
Dry cough x 4 to 5 days.   ?

## 2021-09-03 NOTE — ED Provider Notes (Signed)
?[facility] ? ? ? ?CSN: [id number] ?Arrival date & time: 09/03/21  1830 ? ? ?  ? ?History   ?Chief Complaint ?No chief complaint on file. ? ? ?HPI ?Jonathan Shepard is a 71 y.o. male.  ? ?Presenting today with 4 to 5-day history of dry hacking cough, runny nose, postnasal drainage.  Denies fever, chills, chest pain, shortness of breath, abdominal pain, nausea vomiting or diarrhea.  So far trying Zyrtec with no relief.  No known sick contacts recently.  History of seasonal allergies, just restarted his Zyrtec 3 days ago.  Does also have a history of fairly well-controlled diabetes, CAD but no history of pulmonary disease. ? ? ?Past Medical History:  ?Diagnosis Date  ? CAD (coronary artery disease)   ? Diabetes ([place])   ? Dyspnea   ? GERD (gastroesophageal reflux disease)   ? Hyperlipidemia   ? Hypertension   ? Joint pain   ? hand, related to work  ? Obesity   ? ? ?Patient Active Problem List  ? Diagnosis Date Noted  ? Encounter for screening colonoscopy 07/21/2018  ? Transient Bifascicular block: RBBB+LAFB 04/23/2016  ? S/P CABG x 4 04/22/2016  ? PAD (peripheral artery disease) ([place]) 01/31/2013  ? CAD (coronary artery disease) 12/07/2012  ? S/P angioplasty with stent-'02, '08. Low risk Myoview 2011 12/07/2012  ? Non-insulin dependent type 2 diabetes mellitus ([place]) 12/07/2012  ? Dyslipidemia 12/07/2012  ? Family history of coronary artery disease 12/07/2012  ? Chest pain with moderate risk for cardiac etiology 12/07/2012  ? ? ?Past Surgical History:  ?Procedure Laterality Date  ? CARDIAC CATHETERIZATION N/A 04/14/2016  ? Procedure: Left Heart Cath and Coronary Angiography;  Surgeon: Peter M Martinique, MD;  Location: [facility];  Service: Cardiovascular;  Laterality: N/A;  ? COLONOSCOPY WITH PROPOFOL N/A 01/09/2019  ? Procedure: COLONOSCOPY WITH PROPOFOL;  Surgeon: Daneil Dolin, MD;  [facility];  Service: Endoscopy;  Laterality: N/A;  8:30am  ? CORONARY ARTERY BYPASS GRAFT N/A 04/22/2016  ?  Procedure: CORONARY ARTERY BYPASS GRAFTING (CABG)x4 with endoscopic harvesting of right saphenous vein SVG to diagonal SVG to PDA Left Radial artery to OM 2 LIMA to LAD;  Surgeon: Melrose Nakayama, MD;  Location: [facility];  Service: Open Heart Surgery;  Laterality: N/A;  ? RADIAL ARTERY HARVEST Left 04/22/2016  ? Procedure: LEFT RADIAL ARTERY HARVEST;  Surgeon: Melrose Nakayama, MD;  Location: [facility];  Service: Open Heart Surgery;  Laterality: Left;  ? TEE WITHOUT CARDIOVERSION N/A 04/22/2016  ? Procedure: TRANSESOPHAGEAL ECHOCARDIOGRAM (TEE);  Surgeon: Melrose Nakayama, MD;  Location: [facility];  Service: Open Heart Surgery;  Laterality: N/A;  ? ? ? ? ? ?Home Medications   ? ?Prior to Admission medications   ?Medication Sig Start Date End Date Taking? Authorizing Provider  ?fluticasone (FLONASE) 50 MCG/ACT nasal spray Place 1 spray into both nostrils 2 (two) times daily. 09/03/21  Yes Volney American, PA-C  ?predniSONE (DELTASONE) 20 MG tablet Take 2 tablets (40 mg total) by mouth daily with breakfast. 09/03/21  Yes Volney American, PA-C  ?promethazine-dextromethorphan (PROMETHAZINE-DM) 6.25-15 MG/5ML syrup Take 5 mLs by mouth 4 (four) times daily as needed. 09/03/21  Yes Volney American, PA-C  ?ACCU-CHEK AVIVA PLUS test strip  03/31/19   [provider]  ?aspirin EC 81 MG tablet Take 81 mg by mouth every morning.     [provider]  ?benzonatate (TESSALON) 100 MG capsule Take 1 capsule (100  mg total) by mouth every 8 (eight) hours. 01/15/20   Emerson Monte, FNP  ?clopidogrel (PLAVIX) 75 MG tablet Take 1 tablet (75 mg total) by mouth daily. 05/10/19   Croitoru, Mihai, MD  ?Co-Enzyme Q-10 100 MG CAPS Take 100 mg by mouth daily.     [provider]  ?diclofenac (VOLTAREN) 75 MG EC tablet Take 75 mg by mouth 2 (two) times daily.    [provider]  ?fenofibrate 54 MG tablet TAKE 1 TABLET EVERY DAY ?Patient taking differently: Take 54 mg by mouth daily.   05/10/18   Erlene Quan, PA-C  ?glimepiride (AMARYL) 2 MG tablet Take 2 mg by mouth 2 (two) times daily. 06/27/17   [provider]  ?HYDROcodone-acetaminophen (NORCO) 10-325 MG tablet Take 1 tablet by mouth every 6 (six) hours as needed for moderate pain.  02/22/18   [provider]  ?hydrOXYzine (ATARAX/VISTARIL) 25 MG tablet  05/24/19   [provider]  ?menthol-cetylpyridinium (CEPACOL REGULAR STRENGTH) 3 MG lozenge Take 1 lozenge (3 mg total) by mouth as needed for sore throat. 01/15/20   Emerson Monte, FNP  ?metFORMIN (GLUCOPHAGE) 1000 MG tablet Take 1,000 mg by mouth 2 (two) times daily with a meal.    [provider]  ?metoprolol tartrate (LOPRESSOR) 25 MG tablet Take 1 tablet (25 mg total) by mouth 2 (two) times daily. 07/27/18   Croitoru, Mihai, MD  ?Multiple Vitamin (MULTIVITAMIN) tablet Take 1 tablet by mouth daily.    [provider]  ?Omega-3 Fatty Acids (FISH OIL) 1200 MG CAPS Take 1,200 mg by mouth 3 (three) times daily. Take 2400 mg by mouth in the morning and 1200 mg in the evening    [provider]  ?Oxycodone HCl 10 MG TABS Take 10 mg by mouth daily as needed (breakthrough pain).  07/08/18   [provider]  ?pantoprazole (PROTONIX) 40 MG tablet TAKE 1 TABLET EVERY DAY ?Patient taking differently: Take 40 mg by mouth daily.  05/10/18   Erlene Quan, PA-C  ?pravastatin (PRAVACHOL) 80 MG tablet Take 1 tablet (80 mg total) by mouth daily. Keep appointment for any additional refills 06/06/21   Croitoru, Dani Gobble, MD  ?predniSONE (STERAPRED UNI-PAK 21 TAB) 10 MG (21) TBPK tablet Take 6 tabs by mouth daily  for 1 days, then 5 tabs for 1 days, then 4 tabs for 1days, then 3 tabs for 1 days, 2 tabs for 1 days, then 1 tab by mouth daily for 1 days 01/15/20   Emerson Monte, FNP  ?ramipril (ALTACE) 5 MG capsule Take 1 capsule (5 mg total) by mouth daily. 02/15/19   Erlene Quan, PA-C  ?sildenafil (VIAGRA) 100 MG tablet  06/28/19   [provider]  ?valACYclovir (VALTREX) 1000 MG tablet  03/17/19   [provider]  ? ? ?Family History ?Family History  ?Problem Relation Age of Onset  ? Coronary artery disease Father 59  ?     CABG  ? Cancer Father   ? Coronary artery disease Brother 82  ?     CABG  ? Colon cancer Neg Hx   ? Colon polyps Neg Hx   ? ? ?Social History ?Social History  ? ?Tobacco Use  ? Smoking status: Never  ? Smokeless tobacco: Never  ?Substance Use Topics  ? Alcohol use: No  ? Drug use: No  ? ? ? ?Allergies   ?Patient has no known allergies. ? ? ?Review of Systems ?Review of Systems ?Per  HPI ? ?Physical Exam ?Triage Vital Signs ?ED Triage Vitals  ?Enc Vitals Group  ?   BP 09/03/21 1837 (!) 146/81  ?   Pulse Rate 09/03/21 1837 (!) 104  ?   Resp 09/03/21 1837 18  ?   Temp 09/03/21 1837 97.8 ?F (36.6 ?C)  ?   Temp Source 09/03/21 1837 Oral  ?   SpO2 09/03/21 1837 94 %  ?   Weight --   ?   Height --   ?   Head Circumference --   ?   Peak Flow --   ?   Pain Score 09/03/21 1838 0  ?   Pain Loc --   ?   Pain Edu? --   ?   Excl. in [place]? --   ? ?No data found. ? ?Updated Vital Signs ?BP (!) 146/81 (BP Location: Right Arm)   Pulse (!) 104   Temp 97.8 ?F (36.6 ?C) (Oral)   Resp 18   SpO2 94%  ? ?Visual Acuity ?Right Eye Distance:   ?Left Eye Distance:   ?Bilateral Distance:   ? ?Right Eye Near:   ?Left Eye Near:    ?Bilateral Near:    ? ?Physical Exam ?Vitals and nursing note reviewed.  ?Constitutional:   ?   Appearance: Normal appearance.  ?HENT:  ?   Head: Atraumatic.  ?   Nose: Rhinorrhea present.  ?   Mouth/Throat:  ?   Mouth: Mucous membranes are moist.  ?   Pharynx: Oropharynx is clear.  ?Eyes:  ?   Extraocular Movements: Extraocular movements intact.  ?   Conjunctiva/sclera: Conjunctivae normal.  ?Cardiovascular:  ?   Rate and Rhythm: Normal rate and regular rhythm.  ?Pulmonary:  ?   Effort: Pulmonary effort is normal.  ?   Breath sounds: Normal breath sounds. No wheezing or rales.  ?Musculoskeletal:     ?   General:  Normal range of motion.  ?   Cervical back: Normal range of motion and neck supple.  ?Skin: ?   General: Skin is warm and dry.  ?Neurological:  ?   General: No focal deficit present.  ?   Mental Status: He is oriente

## 2021-09-09 ENCOUNTER — Ambulatory Visit (HOSPITAL_COMMUNITY): Payer: Medicare HMO | Attending: Internal Medicine

## 2021-09-09 ENCOUNTER — Encounter: Payer: Self-pay | Admitting: Emergency Medicine

## 2021-09-09 ENCOUNTER — Other Ambulatory Visit: Payer: Self-pay

## 2021-09-09 ENCOUNTER — Encounter (HOSPITAL_COMMUNITY): Payer: Self-pay

## 2021-09-09 ENCOUNTER — Ambulatory Visit
Admission: EM | Admit: 2021-09-09 | Discharge: 2021-09-09 | Disposition: A | Discharge status: Home / Self Care | Payer: Medicare HMO | Attending: Family Medicine | Admitting: Family Medicine

## 2021-09-09 DIAGNOSIS — J22 Unspecified acute lower respiratory infection: Secondary | ICD-10-CM

## 2021-09-09 MED ORDER — DOXYCYCLINE HYCLATE 100 MG PO CAPS: 100.0000 mg | ORAL_CAPSULE | Freq: Two times a day (BID) | ORAL | 0 refills | Status: DC

## 2021-09-09 NOTE — ED Provider Notes (Signed)
?[facility] ? ? ? ?CSN: [id number] ?Arrival date & time: 09/09/21  1634 ? ? ?  ? ?History   ?Chief Complaint ?Chief Complaint  ?Patient presents with  ? Cough  ? ? ?HPI ?Jonathan Shepard is a 71 y.o. male.  ? ?Presenting today with almost 3 weeks of progressively worsening cough, chest congestion, fatigue, runny nose, watery eyes.  Denies known fever, chills, chest pain, abdominal pain, nausea vomiting or diarrhea.  Was seen last week for similar symptoms and prescribed prednisone, cough syrup, nasal sprays and reports no benefit from symptoms.  Denies any known history of chronic pulmonary disease.  No new sick contacts recently. ? ? ?Past Medical History:  ?Diagnosis Date  ? CAD (coronary artery disease)   ? Diabetes ([place])   ? Dyspnea   ? GERD (gastroesophageal reflux disease)   ? Hyperlipidemia   ? Hypertension   ? Joint pain   ? hand, related to work  ? Obesity   ? ? ?Patient Active Problem List  ? Diagnosis Date Noted  ? Encounter for screening colonoscopy 07/21/2018  ? Transient Bifascicular block: RBBB+LAFB 04/23/2016  ? S/P CABG x 4 04/22/2016  ? PAD (peripheral artery disease) ([place]) 01/31/2013  ? CAD (coronary artery disease) 12/07/2012  ? S/P angioplasty with stent-'02, '08. Low risk Myoview 2011 12/07/2012  ? Non-insulin dependent type 2 diabetes mellitus ([place]) 12/07/2012  ? Dyslipidemia 12/07/2012  ? Family history of coronary artery disease 12/07/2012  ? Chest pain with moderate risk for cardiac etiology 12/07/2012  ? ? ?Past Surgical History:  ?Procedure Laterality Date  ? CARDIAC CATHETERIZATION N/A 04/14/2016  ? Procedure: Left Heart Cath and Coronary Angiography;  Surgeon: Peter M Martinique, MD;  Location: [facility];  Service: Cardiovascular;  Laterality: N/A;  ? COLONOSCOPY WITH PROPOFOL N/A 01/09/2019  ? Procedure: COLONOSCOPY WITH PROPOFOL;  Surgeon: Daneil Dolin, MD;  [facility];  Service: Endoscopy;  Laterality: N/A;  8:30am  ? CORONARY ARTERY BYPASS GRAFT N/A  04/22/2016  ? Procedure: CORONARY ARTERY BYPASS GRAFTING (CABG)x4 with endoscopic harvesting of right saphenous vein SVG to diagonal SVG to PDA Left Radial artery to OM 2 LIMA to LAD;  Surgeon: Melrose Nakayama, MD;  Location: [facility];  Service: Open Heart Surgery;  Laterality: N/A;  ? RADIAL ARTERY HARVEST Left 04/22/2016  ? Procedure: LEFT RADIAL ARTERY HARVEST;  Surgeon: Melrose Nakayama, MD;  Location: [facility];  Service: Open Heart Surgery;  Laterality: Left;  ? TEE WITHOUT CARDIOVERSION N/A 04/22/2016  ? Procedure: TRANSESOPHAGEAL ECHOCARDIOGRAM (TEE);  Surgeon: Melrose Nakayama, MD;  Location: [facility];  Service: Open Heart Surgery;  Laterality: N/A;  ? ? ? ? ? ?Home Medications   ? ?Prior to Admission medications   ?Medication Sig Start Date End Date Taking? Authorizing Provider  ?doxycycline (VIBRAMYCIN) 100 MG capsule Take 1 capsule (100 mg total) by mouth 2 (two) times daily. 09/09/21  Yes Volney American, PA-C  ?ACCU-CHEK AVIVA PLUS test strip  03/31/19   [provider]  ?aspirin EC 81 MG tablet Take 81 mg by mouth every morning.     [provider]  ?benzonatate (TESSALON) 100 MG capsule Take 1 capsule (100 mg total) by mouth every 8 (eight) hours. 01/15/20   Emerson Monte, FNP  ?clopidogrel (PLAVIX) 75 MG tablet Take 1 tablet (75 mg total) by mouth daily. 05/10/19   Croitoru, Mihai, MD  ?Co-Enzyme Q-10 100 MG CAPS Take 100 mg by mouth daily.  [provider]  ?diclofenac (VOLTAREN) 75 MG EC tablet Take 75 mg by mouth 2 (two) times daily.    [provider]  ?fenofibrate 54 MG tablet TAKE 1 TABLET EVERY DAY ?Patient taking differently: Take 54 mg by mouth daily.  05/10/18   Erlene Quan, PA-C  ?fluticasone (FLONASE) 50 MCG/ACT nasal spray Place 1 spray into both nostrils 2 (two) times daily. 09/03/21   Volney American, PA-C  ?glimepiride (AMARYL) 2 MG tablet Take 2 mg by mouth 2 (two) times daily. 06/27/17   [provider]   ?HYDROcodone-acetaminophen (NORCO) 10-325 MG tablet Take 1 tablet by mouth every 6 (six) hours as needed for moderate pain.  02/22/18   [provider]  ?hydrOXYzine (ATARAX/VISTARIL) 25 MG tablet  05/24/19   [provider]  ?menthol-cetylpyridinium (CEPACOL REGULAR STRENGTH) 3 MG lozenge Take 1 lozenge (3 mg total) by mouth as needed for sore throat. 01/15/20   Emerson Monte, FNP  ?metFORMIN (GLUCOPHAGE) 1000 MG tablet Take 1,000 mg by mouth 2 (two) times daily with a meal.    [provider]  ?metoprolol tartrate (LOPRESSOR) 25 MG tablet Take 1 tablet (25 mg total) by mouth 2 (two) times daily. 07/27/18   Croitoru, Mihai, MD  ?Multiple Vitamin (MULTIVITAMIN) tablet Take 1 tablet by mouth daily.    [provider]  ?Omega-3 Fatty Acids (FISH OIL) 1200 MG CAPS Take 1,200 mg by mouth 3 (three) times daily. Take 2400 mg by mouth in the morning and 1200 mg in the evening    [provider]  ?Oxycodone HCl 10 MG TABS Take 10 mg by mouth daily as needed (breakthrough pain).  07/08/18   [provider]  ?pantoprazole (PROTONIX) 40 MG tablet TAKE 1 TABLET EVERY DAY ?Patient taking differently: Take 40 mg by mouth daily.  05/10/18   Erlene Quan, PA-C  ?pravastatin (PRAVACHOL) 80 MG tablet Take 1 tablet (80 mg total) by mouth daily. Keep appointment for any additional refills 06/06/21   Croitoru, Dani Gobble, MD  ?predniSONE (DELTASONE) 20 MG tablet Take 2 tablets (40 mg total) by mouth daily with breakfast. 09/03/21   Volney American, PA-C  ?predniSONE (STERAPRED UNI-PAK 21 TAB) 10 MG (21) TBPK tablet Take 6 tabs by mouth daily  for 1 days, then 5 tabs for 1 days, then 4 tabs for 1days, then 3 tabs for 1 days, 2 tabs for 1 days, then 1 tab by mouth daily for 1 days 01/15/20   Emerson Monte, FNP  ?promethazine-dextromethorphan (PROMETHAZINE-DM) 6.25-15 MG/5ML syrup Take 5 mLs by mouth 4 (four) times daily as needed. 09/03/21   Volney American, PA-C  ?ramipril  (ALTACE) 5 MG capsule Take 1 capsule (5 mg total) by mouth daily. 02/15/19   Erlene Quan, PA-C  ?sildenafil (VIAGRA) 100 MG tablet  06/28/19   [provider]  ?valACYclovir (VALTREX) 1000 MG tablet  03/17/19   [provider]  ? ? ?Family History ?Family History  ?Problem Relation Age of Onset  ? Coronary artery disease Father 92  ?     CABG  ? Cancer Father   ? Coronary artery disease Brother 62  ?     CABG  ? Colon cancer Neg Hx   ? Colon polyps Neg Hx   ? ? ?Social History ?Social History  ? ?Tobacco Use  ? Smoking status: Never  ? Smokeless tobacco: Never  ?Substance Use Topics  ? Alcohol use: No  ? Drug use: No  ? ? ? ?  Allergies   ?Patient has no known allergies. ? ? ?Review of Systems ?Review of Systems ?Per HPI ? ?Physical Exam ?Triage Vital Signs ?ED Triage Vitals  ?Enc Vitals Group  ?   BP 09/09/21 1915 (!) 179/100  ?   Pulse Rate 09/09/21 1915 (!) 107  ?   Resp 09/09/21 1915 18  ?   Temp 09/09/21 1915 97.8 ?F (36.6 ?C)  ?   Temp Source 09/09/21 1915 Oral  ?   SpO2 09/09/21 1915 93 %  ?   Weight 09/09/21 1916 215 lb (97.5 kg)  ?   Height 09/09/21 1916 6' (1.829 m)  ?   Head Circumference --   ?   Peak Flow --   ?   Pain Score 09/09/21 1916 7  ?   Pain Loc --   ?   Pain Edu? --   ?   Excl. in [place]? --   ? ?No data found. ? ?Updated Vital Signs ?BP (!) 179/100 (BP Location: Right Arm) Comment: pt reports elevated bp all day and reports has taken bp meds today.  Pulse (!) 107   Temp 97.8 ?F (36.6 ?C) (Oral)   Resp 18   Ht 6' (1.829 m)   Wt 215 lb (97.5 kg)   SpO2 93%   BMI 29.16 kg/m?  ? ?Visual Acuity ?Right Eye Distance:   ?Left Eye Distance:   ?Bilateral Distance:   ? ?Right Eye Near:   ?Left Eye Near:    ?Bilateral Near:    ? ?Physical Exam ?Vitals and nursing note reviewed.  ?Constitutional:   ?   Appearance: Normal appearance. He is well-developed.  ?HENT:  ?   Head: Atraumatic.  ?   Right Ear: External ear normal.  ?   Left Ear: External ear normal.  ?   Nose: Rhinorrhea present.   ?   Mouth/Throat:  ?   Pharynx: Posterior oropharyngeal erythema present. No oropharyngeal exudate.  ?Eyes:  ?   General:     ?   Right eye: Discharge present.     ?   Left eye: Discharge present. ?   Extraocular Movements:

## 2021-09-09 NOTE — ED Triage Notes (Signed)
Pt reports cough and chest congestion x3 weeks. Pt reports was seen last week for similar and prescribed prednisone, cough syrup, and nasal syrup for bronchitis. Pt reports no improvement in symptoms. ?

## 2021-09-11 DIAGNOSIS — M5412 Radiculopathy, cervical region: Secondary | ICD-10-CM | POA: Diagnosis not present

## 2021-09-11 DIAGNOSIS — T50905A Adverse effect of unspecified drugs, medicaments and biological substances, initial encounter: Secondary | ICD-10-CM | POA: Diagnosis not present

## 2021-09-11 DIAGNOSIS — E1165 Type 2 diabetes mellitus with hyperglycemia: Secondary | ICD-10-CM | POA: Diagnosis not present

## 2021-09-11 DIAGNOSIS — G894 Chronic pain syndrome: Secondary | ICD-10-CM | POA: Diagnosis not present

## 2021-09-18 ENCOUNTER — Ambulatory Visit (INDEPENDENT_AMBULATORY_CARE_PROVIDER_SITE_OTHER): Payer: Medicare HMO | Admitting: Cardiovascular Disease

## 2021-09-18 ENCOUNTER — Encounter: Payer: Self-pay | Admitting: Cardiovascular Disease

## 2021-09-18 VITALS — BP 160/88 | HR 76 | Ht 72.0 in | Wt 217.8 lb

## 2021-09-18 DIAGNOSIS — E119 Type 2 diabetes mellitus without complications: Secondary | ICD-10-CM | POA: Diagnosis not present

## 2021-09-18 DIAGNOSIS — I739 Peripheral vascular disease, unspecified: Secondary | ICD-10-CM

## 2021-09-18 DIAGNOSIS — I2510 Atherosclerotic heart disease of native coronary artery without angina pectoris: Secondary | ICD-10-CM | POA: Diagnosis not present

## 2021-09-18 DIAGNOSIS — I10 Essential (primary) hypertension: Secondary | ICD-10-CM | POA: Diagnosis not present

## 2021-09-18 DIAGNOSIS — E782 Mixed hyperlipidemia: Secondary | ICD-10-CM | POA: Diagnosis not present

## 2021-09-18 NOTE — Progress Notes (Signed)
For ? ?Cardiology Office Note   ? ?Date:  09/18/2021  ? ?ID:  Jonathan Shepard, DOB April 08, 1951, MRN [id number] ? ?PCP:  Redmond School, MD  ?Cardiologist:   Sanda Klein, MD  ? ?No chief complaint on file. ? ? ?History of Present Illness:  ?Jonathan Shepard is a 71 y.o. male with long-standing history of coronary artery disease (RCA and LAD stents 2002 and 2008,CABG Nov 2017 -  LIMA to LAD, SVG to diagonal, SVG to PDA, left radial to OM 2, Dr. Roxan Hockey) who presents in follow-up after undergoing 4 vessel bypass surgery roughly 5 years ago. ? ?He does not have any cardiovascular complaints.  He continues to work doing maintenance.  He does not have any exertional angina or dyspnea.  He denies palpitations, dizziness or syncope.  He complains of numbness and pain in his left shoulder radiating down his left arm as well as in his left neck.  Always worse when he wakes up in the morning.  It is not exertional related. ? ?He has recently been taking doxycycline and prednisone for a persistent cough after an upper respiratory tract infection.  He just completed this last week.  It has made his blood sugar go up.  He has been taking diclofenac for years for joint pain.  Recently his blood pressure has been persistently high.  He has been going to the [employer] for work and sometimes they are worried about his blood pressure and stated that it is almost too high to work on him.  His blood pressure was high today at 160/88 and even after he relaxed for at least 20 minutes it was still high at 149/79. ? ?He has not had any falls, injuries or serious bleeding problems while taking dual antiplatelet aspirin plus clopidogrel therapy. ? ?Most recent hemoglobin A1c on record I have is 7.8%, but he tells me that a more recent value was 8%.  This is substantially higher than it was a couple of years ago at 6.3%.  Dr. Gerarda Fraction prescribed Janumet but he did not tolerate this and is back on metformin plus  glimepiride. ? ?His most recent LDL cholesterol was excellent at 67.  He has a persistently low HDL at 30.  He remains borderline obese with a BMI of 29.5. ? ? ?Initial presentation in 2002 - mid right coronary Hepacoat 3.0x13 stent.  ?In May 2008 had restenosis in the right coronary artery stent and "sandwich stenting" - Liberte 3.75 stent.  ?In December 2008 had cutting balloon angioplasty for in stent restenosis. In the same setting received a drug-eluting 3 x 15 mm Promus stent in the proximal LAD artery for a de novo lesion. ?July 2014 a nuclear stress test performed for similar complaints of exertional discomfort was normal. ?November 2017 worsening exertional angina led to angiography and four-vessel bypass surgery. ?  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 He has well-controlled diabetes mellitus and mixed hyperlipidemia ?  ? ?Past Medical History:  ?Diagnosis Date  ? CAD (coronary artery disease)   ? Diabetes ([place])   ? Dyspnea   ? GERD (gastroesophageal reflux disease)   ? Hyperlipidemia   ? Hypertension   ? Joint pain   ? hand, related to work  ? Obesity   ? ? ?Past Surgical History:  ?Procedure Laterality Date  ? CARDIAC CATHETERIZATION N/A 04/14/2016  ? Procedure:  Left Heart Cath and Coronary Angiography;  Surgeon: Peter M Martinique, MD;  Location: [facility];  Service: Cardiovascular;  Laterality: N/A;  ? COLONOSCOPY WITH PROPOFOL N/A 01/09/2019  ? Procedure: COLONOSCOPY WITH PROPOFOL;  Surgeon: Daneil Dolin, MD;  [facility];  Service: Endoscopy;  Laterality: N/A;  8:30am  ? CORONARY ARTERY BYPASS GRAFT N/A 04/22/2016  ? Procedure: CORONARY ARTERY BYPASS GRAFTING (CABG)x4 with endoscopic harvesting of right saphenous vein SVG to diagonal SVG to PDA Left Radial artery to OM 2 LIMA to LAD;  Surgeon: Melrose Nakayama, MD;  Location: [place];  Service: Open Heart  Surgery;  Laterality: N/A;  ? RADIAL ARTERY HARVEST Left 04/22/2016  ? Procedure: LEFT RADIAL ARTERY HARVEST;  Surgeon: Melrose Nakayama, MD;  Location: [place];  Service: Open Heart Surgery;  Laterality: Left;  ? TEE WITHOUT CARDIOVERSION N/A 04/22/2016  ? Procedure: TRANSESOPHAGEAL ECHOCARDIOGRAM (TEE);  Surgeon: Melrose Nakayama, MD;  Location: [place];  Service: Open Heart Surgery;  Laterality: N/A;  ? ? ?Current Medications: ?Outpatient Medications Prior to Visit  ?Medication Sig Dispense Refill  ? ACCU-CHEK AVIVA PLUS test strip     ? aspirin EC 81 MG tablet Take 81 mg by mouth every morning.     ? clopidogrel (PLAVIX) 75 MG tablet Take 1 tablet (75 mg total) by mouth daily. 90 tablet 0  ? Co-Enzyme Q-10 100 MG CAPS Take 100 mg by mouth daily.     ? diclofenac (VOLTAREN) 75 MG EC tablet Take 75 mg by mouth 2 (two) times daily.    ? doxycycline (VIBRAMYCIN) 100 MG capsule Take 1 capsule (100 mg total) by mouth 2 (two) times daily. 20 capsule 0  ? fenofibrate 54 MG tablet TAKE 1 TABLET EVERY DAY (Patient taking differently: Take 54 mg by mouth daily.) 90 tablet 1  ? fluticasone (FLONASE) 50 MCG/ACT nasal spray Place 1 spray into both nostrils 2 (two) times daily. 16 g 2  ? glimepiride (AMARYL) 2 MG tablet Take 2 mg by mouth 2 (two) times daily.    ? HYDROcodone-acetaminophen (NORCO) 10-325 MG tablet Take 1 tablet by mouth every 6 (six) hours as needed for moderate pain.   0  ? hydrOXYzine (ATARAX/VISTARIL) 25 MG tablet     ? menthol-cetylpyridinium (CEPACOL REGULAR STRENGTH) 3 MG lozenge Take 1 lozenge (3 mg total) by mouth as needed for sore throat. 100 tablet 2  ? metFORMIN (GLUCOPHAGE) 1000 MG tablet Take 1,000 mg by mouth 2 (two) times daily with a meal.    ? metoprolol tartrate (LOPRESSOR) 25 MG tablet Take 1 tablet (25 mg total) by mouth 2 (two) times daily. 180 tablet 0  ? Multiple Vitamin (MULTIVITAMIN) tablet Take 1 tablet by mouth daily.    ? Omega-3 Fatty Acids (FISH OIL) 1200 MG CAPS Take 1,200  mg by mouth 3 (three) times daily. Take 2400 mg by mouth in the morning and 1200 mg in the evening    ? Oxycodone HCl 10 MG TABS Take 10 mg by mouth daily as needed (breakthrough pain).     ? pantoprazole (PROTONIX) 40 MG tablet TAKE 1 TABLET EVERY DAY (Patient taking differently: Take 40 mg by mouth daily.) 90 tablet 1  ? pravastatin (PRAVACHOL) 80 MG tablet Take 1 tablet (80 mg total) by mouth daily. Keep appointment for any additional refills 90 tablet 1  ? promethazine-dextromethorphan (PROMETHAZINE-DM) 6.25-15 MG/5ML syrup Take 5 mLs by mouth 4 (four) times daily as needed. 100 mL 0  ?  ramipril (ALTACE) 5 MG capsule Take 1 capsule (5 mg total) by mouth daily. 30 capsule 0  ? sildenafil (VIAGRA) 100 MG tablet Take by mouth.    ? sildenafil (VIAGRA) 100 MG tablet     ? benzonatate (TESSALON) 100 MG capsule Take 1 capsule (100 mg total) by mouth every 8 (eight) hours. (Patient not taking: Reported on 09/18/2021) 30 capsule 0  ? predniSONE (DELTASONE) 20 MG tablet Take 2 tablets (40 mg total) by mouth daily with breakfast. (Patient not taking: Reported on 09/18/2021) 8 tablet 0  ? predniSONE (STERAPRED UNI-PAK 21 TAB) 10 MG (21) TBPK tablet Take 6 tabs by mouth daily  for 1 days, then 5 tabs for 1 days, then 4 tabs for 1days, then 3 tabs for 1 days, 2 tabs for 1 days, then 1 tab by mouth daily for 1 days (Patient not taking: Reported on 09/18/2021) 21 tablet 0  ? valACYclovir (VALTREX) 1000 MG tablet  (Patient not taking: Reported on 09/18/2021)    ? ?No facility-administered medications prior to visit.  ?  ? ?Allergies:   Patient has no known allergies.  ? ?Social History  ? ?Socioeconomic History  ? Marital status: Married  ?  Spouse name: Not on file  ? Number of children: Not on file  ? Years of education: Not on file  ? Highest education level: Not on file  ?Occupational History  ? Occupation: retired  ?  Comment: [employer] in [place]  ?Tobacco Use  ? Smoking status: Never  ? Smokeless tobacco: Never  ?Substance  and Sexual Activity  ? Alcohol use: No  ? Drug use: No  ? Sexual activity: Not on file  ?Other Topics Concern  ? Not on file  ?Social History Narrative  ? Has 3 children  ? Has 2 grandchildren  ? ?Social Determinan

## 2021-09-18 NOTE — Patient Instructions (Signed)
Medication Instructions:  ?No changes ?*If you need a refill on your cardiac medications before your next appointment, please call your pharmacy* ? ? ?Lab Work: ?None ordered ?If you have labs (blood work) drawn today and your tests are completely normal, you will receive your results only by: ?MyChart Message (if you have MyChart) OR ?A paper copy in the mail ?If you have any lab test that is abnormal or we need to change your treatment, we will call you to review the results. ? ? ?Testing/Procedures: ?None ordered ? ? ?Follow-Up: ?At [facility], you and your health needs are our priority.  As part of our continuing mission to provide you with exceptional heart care, we have created designated Provider Care Teams.  These Care Teams include your primary Cardiologist (physician) and Advanced Practice Providers (APPs -  Physician Assistants and Nurse Practitioners) who all work together to provide you with the care you need, when you need it. ? ?We recommend signing up for the patient portal called "MyChart".  Sign up information is provided on this After Visit Summary.  MyChart is used to connect with patients for Virtual Visits (Telemedicine).  Patients are able to view lab/test results, encounter notes, upcoming appointments, etc.  Non-urgent messages can be sent to your provider as well.   ?To learn more about what you can do with MyChart, go to [email or web address].   ? ?Your next appointment:   ?12 month(s) ? ?The format for your next appointment:   ?In Person ? ?Provider:   ?[name] ? ?Other Instructions ?[name] would like you to check your blood pressure daily for the next week.  Keep a journal of these daily blood pressure and heart rate readings and call our office or send a message through [place] with the results. Thank you! ? ?It is best to check your BP 1-2 hours after taking your medications to see the medications effectiveness on your BP.  ?  ?Here are some tips that our clinical  pharmacists share for home BP monitoring: ??         Rest 10 minutes before taking your blood pressure. ??         Don't smoke or drink caffeinated beverages for at least 30 minutes before. ??         Take your blood pressure before (not after) you eat. ??         Sit comfortably with your back supported and both feet on the floor (don't cross your legs). ??         Elevate your arm to heart level on a table or a desk. ??         Use the proper sized cuff. It should fit smoothly and snugly around your bare upper arm. There should be enough room to slip a fingertip under the cuff. The bottom edge of the cuff should be 1 inch above the crease of the elbow. ? ? ?Important Information About Sugar ? ? ? ? ? ? ?

## 2021-09-24 ENCOUNTER — Encounter: Payer: Self-pay | Admitting: Cardiovascular Disease

## 2021-10-21 DIAGNOSIS — M25512 Pain in left shoulder: Secondary | ICD-10-CM | POA: Diagnosis not present

## 2021-10-21 DIAGNOSIS — M5412 Radiculopathy, cervical region: Secondary | ICD-10-CM | POA: Diagnosis not present

## 2021-10-21 DIAGNOSIS — G894 Chronic pain syndrome: Secondary | ICD-10-CM | POA: Diagnosis not present

## 2021-10-21 DIAGNOSIS — M542 Cervicalgia: Secondary | ICD-10-CM | POA: Diagnosis not present

## 2021-10-30 ENCOUNTER — Other Ambulatory Visit: Payer: Self-pay | Admitting: Cardiovascular Disease

## 2021-12-01 DIAGNOSIS — E559 Vitamin D deficiency, unspecified: Secondary | ICD-10-CM | POA: Diagnosis not present

## 2021-12-01 DIAGNOSIS — Z1331 Encounter for screening for depression: Secondary | ICD-10-CM | POA: Diagnosis not present

## 2021-12-01 DIAGNOSIS — I10 Essential (primary) hypertension: Secondary | ICD-10-CM | POA: Diagnosis not present

## 2021-12-01 DIAGNOSIS — E119 Type 2 diabetes mellitus without complications: Secondary | ICD-10-CM | POA: Diagnosis not present

## 2021-12-01 DIAGNOSIS — Z23 Encounter for immunization: Secondary | ICD-10-CM | POA: Diagnosis not present

## 2021-12-01 DIAGNOSIS — L4050 Arthropathic psoriasis, unspecified: Secondary | ICD-10-CM | POA: Diagnosis not present

## 2021-12-01 DIAGNOSIS — D519 Vitamin B12 deficiency anemia, unspecified: Secondary | ICD-10-CM | POA: Diagnosis not present

## 2021-12-01 DIAGNOSIS — Z0001 Encounter for general adult medical examination with abnormal findings: Secondary | ICD-10-CM | POA: Diagnosis not present

## 2021-12-01 DIAGNOSIS — R5383 Other fatigue: Secondary | ICD-10-CM | POA: Diagnosis not present

## 2021-12-01 DIAGNOSIS — G894 Chronic pain syndrome: Secondary | ICD-10-CM | POA: Diagnosis not present

## 2021-12-01 DIAGNOSIS — Z125 Encounter for screening for malignant neoplasm of prostate: Secondary | ICD-10-CM | POA: Diagnosis not present

## 2021-12-01 DIAGNOSIS — Z6831 Body mass index (BMI) 31.0-31.9, adult: Secondary | ICD-10-CM | POA: Diagnosis not present

## 2021-12-01 DIAGNOSIS — I209 Angina pectoris, unspecified: Secondary | ICD-10-CM | POA: Diagnosis not present

## 2021-12-01 DIAGNOSIS — E1129 Type 2 diabetes mellitus with other diabetic kidney complication: Secondary | ICD-10-CM | POA: Diagnosis not present

## 2021-12-01 DIAGNOSIS — G4733 Obstructive sleep apnea (adult) (pediatric): Secondary | ICD-10-CM | POA: Diagnosis not present

## 2021-12-10 DIAGNOSIS — H25813 Combined forms of age-related cataract, bilateral: Secondary | ICD-10-CM | POA: Diagnosis not present

## 2021-12-10 DIAGNOSIS — H25812 Combined forms of age-related cataract, left eye: Secondary | ICD-10-CM | POA: Diagnosis not present

## 2021-12-10 DIAGNOSIS — H4322 Crystalline deposits in vitreous body, left eye: Secondary | ICD-10-CM | POA: Diagnosis not present

## 2021-12-10 DIAGNOSIS — H25811 Combined forms of age-related cataract, right eye: Secondary | ICD-10-CM | POA: Diagnosis not present

## 2021-12-16 DIAGNOSIS — G4733 Obstructive sleep apnea (adult) (pediatric): Secondary | ICD-10-CM | POA: Diagnosis not present

## 2021-12-25 DIAGNOSIS — H25812 Combined forms of age-related cataract, left eye: Secondary | ICD-10-CM | POA: Diagnosis not present

## 2022-01-09 DIAGNOSIS — H25812 Combined forms of age-related cataract, left eye: Secondary | ICD-10-CM | POA: Diagnosis not present

## 2022-01-09 DIAGNOSIS — H269 Unspecified cataract: Secondary | ICD-10-CM | POA: Diagnosis not present

## 2022-01-11 DIAGNOSIS — G4733 Obstructive sleep apnea (adult) (pediatric): Secondary | ICD-10-CM | POA: Diagnosis not present

## 2022-01-23 DIAGNOSIS — H25811 Combined forms of age-related cataract, right eye: Secondary | ICD-10-CM | POA: Diagnosis not present

## 2022-01-23 DIAGNOSIS — H269 Unspecified cataract: Secondary | ICD-10-CM | POA: Diagnosis not present

## 2022-01-26 DIAGNOSIS — G894 Chronic pain syndrome: Secondary | ICD-10-CM | POA: Diagnosis not present

## 2022-01-26 DIAGNOSIS — I10 Essential (primary) hypertension: Secondary | ICD-10-CM | POA: Diagnosis not present

## 2022-01-26 DIAGNOSIS — M5412 Radiculopathy, cervical region: Secondary | ICD-10-CM | POA: Diagnosis not present

## 2022-01-26 DIAGNOSIS — E1129 Type 2 diabetes mellitus with other diabetic kidney complication: Secondary | ICD-10-CM | POA: Diagnosis not present

## 2022-04-22 DIAGNOSIS — L4050 Arthropathic psoriasis, unspecified: Secondary | ICD-10-CM | POA: Diagnosis not present

## 2022-04-22 DIAGNOSIS — Z6831 Body mass index (BMI) 31.0-31.9, adult: Secondary | ICD-10-CM | POA: Diagnosis not present

## 2022-04-22 DIAGNOSIS — I10 Essential (primary) hypertension: Secondary | ICD-10-CM | POA: Diagnosis not present

## 2022-04-22 DIAGNOSIS — M5412 Radiculopathy, cervical region: Secondary | ICD-10-CM | POA: Diagnosis not present

## 2022-04-22 DIAGNOSIS — M1991 Primary osteoarthritis, unspecified site: Secondary | ICD-10-CM | POA: Diagnosis not present

## 2022-04-22 DIAGNOSIS — E1129 Type 2 diabetes mellitus with other diabetic kidney complication: Secondary | ICD-10-CM | POA: Diagnosis not present

## 2022-04-22 DIAGNOSIS — G4733 Obstructive sleep apnea (adult) (pediatric): Secondary | ICD-10-CM | POA: Diagnosis not present

## 2022-04-22 DIAGNOSIS — G894 Chronic pain syndrome: Secondary | ICD-10-CM | POA: Diagnosis not present

## 2022-04-22 DIAGNOSIS — E6609 Other obesity due to excess calories: Secondary | ICD-10-CM | POA: Diagnosis not present

## 2022-06-06 ENCOUNTER — Other Ambulatory Visit: Payer: Self-pay | Admitting: Cardiovascular Disease

## 2022-07-02 DIAGNOSIS — I10 Essential (primary) hypertension: Secondary | ICD-10-CM | POA: Diagnosis not present

## 2022-07-02 DIAGNOSIS — M5412 Radiculopathy, cervical region: Secondary | ICD-10-CM | POA: Diagnosis not present

## 2022-07-02 DIAGNOSIS — G894 Chronic pain syndrome: Secondary | ICD-10-CM | POA: Diagnosis not present

## 2022-07-02 DIAGNOSIS — M25512 Pain in left shoulder: Secondary | ICD-10-CM | POA: Diagnosis not present

## 2022-07-03 ENCOUNTER — Other Ambulatory Visit (HOSPITAL_COMMUNITY): Payer: Self-pay | Admitting: Internal Medicine

## 2022-07-03 DIAGNOSIS — M542 Cervicalgia: Secondary | ICD-10-CM

## 2022-07-03 DIAGNOSIS — M25512 Pain in left shoulder: Secondary | ICD-10-CM

## 2022-07-08 ENCOUNTER — Encounter: Payer: Self-pay | Admitting: Cardiovascular Disease

## 2022-07-08 ENCOUNTER — Telehealth: Payer: Self-pay | Admitting: Cardiovascular Disease

## 2022-07-08 DIAGNOSIS — E1129 Type 2 diabetes mellitus with other diabetic kidney complication: Secondary | ICD-10-CM | POA: Diagnosis not present

## 2022-07-08 DIAGNOSIS — E1122 Type 2 diabetes mellitus with diabetic chronic kidney disease: Secondary | ICD-10-CM | POA: Diagnosis not present

## 2022-07-08 DIAGNOSIS — I10 Essential (primary) hypertension: Secondary | ICD-10-CM | POA: Diagnosis not present

## 2022-07-08 DIAGNOSIS — N183 Chronic kidney disease, stage 3 unspecified: Secondary | ICD-10-CM | POA: Diagnosis not present

## 2022-07-08 MED ORDER — AMLODIPINE BESYLATE 5 MG PO TABS: 5.0000 mg | ORAL_TABLET | Freq: Every day | ORAL | 3 refills | Status: DC

## 2022-07-08 NOTE — Telephone Encounter (Signed)
Patient of [name] walked in to office. He was in [facility] this morning for a dentist appointment. He reports elevated BP at this visit - 181/98, 169/98, 181/112. There was no re-check at end of appointment, before he left, per patient report. He said he feels like he has a sinus headache and sneezed some this morning.   Reviewed med list with patient. He reports he may be on a different metoprolol and a different dose of ramipril. He said this was reviewed at his PCP office last week but I advised him we are in different charting systems.   Advised that we can schedule him an acute visit with PA or NP but we do not accept walk-in BP check appointments per protocol. Advised that he check with home meds with list (provided) and call or send MyChart message with medication update. Advised him to check his BP at home after resting about 30 minutes and without prior caffeine intake. He reports no previous elevated BP readings but also does not check routinely at home.   He is scheduled 08/05/22 for a 1 year visit (due 09/2022)

## 2022-07-08 NOTE — Telephone Encounter (Signed)
Please add amlodipine 5 mg once daily to the current prescription of ramipril and metoprolol.  Send [place] a blood pressure log in about 10 days.

## 2022-07-09 DIAGNOSIS — Z6830 Body mass index (BMI) 30.0-30.9, adult: Secondary | ICD-10-CM | POA: Diagnosis not present

## 2022-07-09 DIAGNOSIS — Z9989 Dependence on other enabling machines and devices: Secondary | ICD-10-CM | POA: Diagnosis not present

## 2022-07-09 DIAGNOSIS — G4733 Obstructive sleep apnea (adult) (pediatric): Secondary | ICD-10-CM | POA: Diagnosis not present

## 2022-07-09 DIAGNOSIS — J339 Nasal polyp, unspecified: Secondary | ICD-10-CM | POA: Diagnosis not present

## 2022-07-10 NOTE — Telephone Encounter (Signed)
Patient sent in MyChart message and was Rx'ed new BP med.

## 2022-07-28 ENCOUNTER — Encounter (HOSPITAL_COMMUNITY): Payer: Self-pay

## 2022-07-28 ENCOUNTER — Ambulatory Visit (HOSPITAL_COMMUNITY)
Admission: RE | Admit: 2022-07-28 | Discharge: 2022-07-28 | Disposition: A | Discharge status: Home / Self Care | Payer: Medicare HMO | Source: Ambulatory Visit | Attending: Internal Medicine | Admitting: Internal Medicine

## 2022-07-28 ENCOUNTER — Ambulatory Visit (HOSPITAL_COMMUNITY): Payer: Medicare HMO

## 2022-07-28 DIAGNOSIS — M25512 Pain in left shoulder: Secondary | ICD-10-CM | POA: Diagnosis not present

## 2022-07-28 DIAGNOSIS — M542 Cervicalgia: Secondary | ICD-10-CM | POA: Diagnosis not present

## 2022-07-28 DIAGNOSIS — S46012A Strain of muscle(s) and tendon(s) of the rotator cuff of left shoulder, initial encounter: Secondary | ICD-10-CM | POA: Diagnosis not present

## 2022-07-28 DIAGNOSIS — M19012 Primary osteoarthritis, left shoulder: Secondary | ICD-10-CM | POA: Diagnosis not present

## 2022-07-30 MED ORDER — RAMIPRIL 10 MG PO CAPS: 10.0000 mg | ORAL_CAPSULE | Freq: Every day | ORAL | 0 refills | Status: AC

## 2022-07-30 NOTE — Telephone Encounter (Signed)
Please increase ramipril to 10 mg daily and send another BP log in 2 weeks. Thanks!

## 2022-07-30 NOTE — Addendum Note (Signed)
Addended by: Fidel Levy on: 07/30/2022 10:17 AM   Modules accepted: Orders

## 2022-07-31 NOTE — Telephone Encounter (Signed)
Thanks. Please increase the amlodipine to 10 mg daily. BP log in 2 weeks.

## 2022-08-03 MED ORDER — AMLODIPINE BESYLATE 10 MG PO TABS: 10.0000 mg | ORAL_TABLET | Freq: Every day | ORAL | 1 refills | Status: DC

## 2022-08-03 NOTE — Addendum Note (Signed)
Addended by: Fidel Levy on: 08/03/2022 07:54 AM   Modules accepted: Orders

## 2022-08-05 ENCOUNTER — Encounter: Payer: Self-pay | Admitting: Cardiovascular Disease

## 2022-08-05 ENCOUNTER — Ambulatory Visit: Payer: Medicare HMO | Attending: Cardiovascular Disease | Admitting: Cardiovascular Disease

## 2022-08-05 VITALS — BP 128/74 | HR 72 | Ht 72.0 in | Wt 222.8 lb

## 2022-08-05 DIAGNOSIS — E782 Mixed hyperlipidemia: Secondary | ICD-10-CM

## 2022-08-05 DIAGNOSIS — I2510 Atherosclerotic heart disease of native coronary artery without angina pectoris: Secondary | ICD-10-CM

## 2022-08-05 DIAGNOSIS — E119 Type 2 diabetes mellitus without complications: Secondary | ICD-10-CM | POA: Diagnosis not present

## 2022-08-05 DIAGNOSIS — I739 Peripheral vascular disease, unspecified: Secondary | ICD-10-CM | POA: Diagnosis not present

## 2022-08-05 DIAGNOSIS — I4510 Unspecified right bundle-branch block: Secondary | ICD-10-CM | POA: Diagnosis not present

## 2022-08-05 DIAGNOSIS — I10 Essential (primary) hypertension: Secondary | ICD-10-CM

## 2022-08-05 NOTE — Patient Instructions (Addendum)
Medication Instructions:   No changes  *If you need a refill on your cardiac medications before your next appointment, please call your pharmacy*   Lab Work: Not needed    Testing/Procedures:  Not  needed  Follow-Up: At [facility], you and your health needs are our priority.  As part of our continuing mission to provide you with exceptional heart care, we have created designated Provider Care Teams.  These Care Teams include your primary Cardiologist (physician) and Advanced Practice Providers (APPs -  Physician Assistants and Nurse Practitioners) who all work together to provide you with the care you need, when you need it.     Your next appointment:   12 month(s)  The format for your next appointment:   In Person  Provider:   Mihai Croitoru, MD    

## 2022-08-05 NOTE — Progress Notes (Signed)
Cardiology Office Note    Date:  08/05/2022   ID:  Jonathan Shepard, DOB Jan 23, 1951, MRN [id number]  PCP:  Redmond School, MD  Cardiologist:   Sanda Klein, MD   Chief Complaint  Patient presents with   Coronary Artery Disease    History of Present Illness:  Jonathan Shepard is a 72 y.o. male with long-standing history of coronary artery disease (RCA and LAD stents 2002 and 2008,CABG Nov 2017 -  LIMA to LAD, SVG to diagonal, SVG to PDA, left radial to OM 2, Dr. Roxan Hockey) who presents in follow-up after undergoing 4 vessel bypass surgery roughly 6 years ago.  Recently has been having issues with his blood pressure being elevated.  His ramipril was increased to 10 mg daily, but his blood pressure remained in the 150-160 mmHg range.  Just about 5 days ago also increased amlodipine to 10 mg once daily.  Blood pressure today is excellent at 128/74.  He has a habit of checking his blood pressure first thing in the morning when he is in a hard to get to work.  Asked him to make sure he has been sitting down and is fully relax for 10 minutes before checking the blood pressure at home.  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  He sometimes get cramps in his left leg in bed at night, but does not have intermittent claudication symptoms.  He's not had any falls or bleeding problems.  He does not have any cardiovascular complaints.  He continues to work doing maintenance.  He does not have any exertional angina or dyspnea.  He denies palpitations, dizziness or syncope.  He complains of numbness and pain in his left shoulder radiating down his left arm as well as in his left neck.  Always worse when he wakes up in the morning.  It is not exertional related.  Metabolic control is good.  Recent hemoglobin A1c was 7.0% and most recent LDL is 76.  He has a chronically low HDL and his  triglycerides are mildly elevated, consistent with insulin resistance.  His weight is essentially unchanged with a BMI right around 30.  Although he is active throughout the day he does not engage in any extra formal physical exercise.  He states that when the weather is better he and his wife will start walking again.  ECG today shows a new right bundle branch block.  Otherwise unchanged from previous tracings.  Initial presentation in 2002 - mid right coronary Hepacoat 3.0x13 stent.  In May 2008 had restenosis in the right coronary artery stent and "sandwich stenting" - Liberte 3.75 stent.  In December 2008 had cutting balloon angioplasty for in stent restenosis. In the same setting received a drug-eluting 3 x 15 mm Promus stent in the proximal LAD artery for a de novo lesion. July 2014 a nuclear stress test performed for similar complaints of exertional discomfort was normal. November 2017 worsening exertional angina led to angiography and four-vessel bypass surgery.   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 He has well-controlled diabetes mellitus and mixed hyperlipidemia    Past Medical History:  Diagnosis Date   CAD (coronary artery disease)    Diabetes ([place])    Dyspnea    GERD (gastroesophageal reflux disease)    Hyperlipidemia  Hypertension    Joint pain    hand, related to work   Obesity     Past Surgical History:  Procedure Laterality Date   CARDIAC CATHETERIZATION N/A 04/14/2016   Procedure: Left Heart Cath and Coronary Angiography;  Surgeon: Peter M Martinique, MD;  Location: [facility];  Service: Cardiovascular;  Laterality: N/A;   COLONOSCOPY WITH PROPOFOL N/A 01/09/2019   Procedure: COLONOSCOPY WITH PROPOFOL;  Surgeon: Daneil Dolin, MD;  Location[facility];  Service: Endoscopy;  Laterality: N/A;  8:30am   CORONARY ARTERY BYPASS GRAFT N/A  04/22/2016   Procedure: CORONARY ARTERY BYPASS GRAFTING (CABG)x4 with endoscopic harvesting of right saphenous vein SVG to diagonal SVG to PDA Left Radial artery to OM 2 LIMA to LAD;  Surgeon: Melrose Nakayama, MD;  Location: [facility];  Service: Open Heart Surgery;  Laterality: N/A;   RADIAL ARTERY HARVEST Left 04/22/2016   Procedure: LEFT RADIAL ARTERY HARVEST;  Surgeon: Melrose Nakayama, MD;  Location: [facility];  Service: Open Heart Surgery;  Laterality: Left;   TEE WITHOUT CARDIOVERSION N/A 04/22/2016   Procedure: TRANSESOPHAGEAL ECHOCARDIOGRAM (TEE);  Surgeon: Melrose Nakayama, MD;  Location: [facility];  Service: Open Heart Surgery;  Laterality: N/A;    Current Medications: Outpatient Medications Prior to Visit  Medication Sig Dispense Refill   amLODipine (NORVASC) 10 MG tablet Take 1 tablet (10 mg total) by mouth daily. 180 tablet 1   aspirin EC 81 MG tablet Take 81 mg by mouth every morning.      clopidogrel (PLAVIX) 75 MG tablet Take 1 tablet (75 mg total) by mouth daily. 90 tablet 0   Co-Enzyme Q-10 100 MG CAPS Take 100 mg by mouth daily.      diclofenac (CATAFLAM) 50 MG tablet Take 50 mg by mouth 2 (two) times daily.     diclofenac (VOLTAREN) 75 MG EC tablet Take 75 mg by mouth 2 (two) times daily.     fenofibrate 54 MG tablet TAKE 1 TABLET EVERY DAY (Patient taking differently: Take 54 mg by mouth daily.) 90 tablet 1   glimepiride (AMARYL) 2 MG tablet Take 2 mg by mouth 2 (two) times daily.     metFORMIN (GLUCOPHAGE) 1000 MG tablet Take 1,000 mg by mouth 2 (two) times daily with a meal.     metoprolol tartrate (LOPRESSOR) 25 MG tablet Take 1 tablet (25 mg total) by mouth 2 (two) times daily. 180 tablet 0   Multiple Vitamin (MULTIVITAMIN) tablet Take 1 tablet by mouth daily.     Omega-3 Fatty Acids (FISH OIL) 1200 MG CAPS Take 1,200 mg by mouth 3 (three) times daily. Take 2400 mg by mouth in the morning and 1200 mg in the evening     pantoprazole (PROTONIX) 40 MG tablet TAKE 1  TABLET EVERY DAY (Patient taking differently: Take 40 mg by mouth daily.) 90 tablet 1   pravastatin (PRAVACHOL) 80 MG tablet TAKE 1 TABLET EVERY DAY 90 tablet 3   ramipril (ALTACE) 10 MG capsule Take 1 capsule (10 mg total) by mouth daily. 90 capsule 0   ACCU-CHEK AVIVA PLUS test strip      benzonatate (TESSALON) 100 MG capsule Take 1 capsule (100 mg total) by mouth every 8 (eight) hours. (Patient not taking: Reported on 09/18/2021) 30 capsule 0   doxycycline (VIBRAMYCIN) 100 MG capsule Take 1 capsule (100 mg total) by mouth 2 (two) times daily. 20 capsule 0   fluticasone (FLONASE) 50 MCG/ACT nasal spray Place 1 spray into  both nostrils 2 (two) times daily. 16 g 2   HYDROcodone-acetaminophen (NORCO) 10-325 MG tablet Take 1 tablet by mouth every 6 (six) hours as needed for moderate pain.  (Patient not taking: Reported on 08/05/2022)  0   hydrOXYzine (ATARAX/VISTARIL) 25 MG tablet      menthol-cetylpyridinium (CEPACOL REGULAR STRENGTH) 3 MG lozenge Take 1 lozenge (3 mg total) by mouth as needed for sore throat. 100 tablet 2   Oxycodone HCl 10 MG TABS Take 10 mg by mouth daily as needed (breakthrough pain).  (Patient not taking: Reported on 08/05/2022)     predniSONE (DELTASONE) 20 MG tablet Take 2 tablets (40 mg total) by mouth daily with breakfast. (Patient not taking: Reported on 09/18/2021) 8 tablet 0   predniSONE (STERAPRED UNI-PAK 21 TAB) 10 MG (21) TBPK tablet Take 6 tabs by mouth daily  for 1 days, then 5 tabs for 1 days, then 4 tabs for 1days, then 3 tabs for 1 days, 2 tabs for 1 days, then 1 tab by mouth daily for 1 days (Patient not taking: Reported on 09/18/2021) 21 tablet 0   promethazine-dextromethorphan (PROMETHAZINE-DM) 6.25-15 MG/5ML syrup Take 5 mLs by mouth 4 (four) times daily as needed. 100 mL 0   sildenafil (VIAGRA) 100 MG tablet Take by mouth.     valACYclovir (VALTREX) 1000 MG tablet  (Patient not taking: Reported on 09/18/2021)     No facility-administered medications prior to visit.      Allergies:   Patient has no known allergies.   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   Smoking status: Never   Smokeless tobacco: Never  Substance and Sexual Activity   Alcohol use: No   Drug use: No   Sexual activity: Not on file  Other Topics Concern   Not on file  Social History Narrative   Has 3 children   Has 2 grandchildren   Social Determinants of Health   Financial Resource Strain: Not on file  Food Insecurity: Not on file  Transportation Needs: Not on file  Physical Activity: Not on file  Stress: Not on file  Social Connections: Not on file     Family History:  The patient's family history includes Cancer in his father; Coronary artery disease (age of onset: 91) in his brother; Coronary artery disease (age of onset: 81) in his father.   ROS:   Please see the history of present illness.    ROS All other systems are reviewed and are negative.   PHYSICAL EXAM:   VS:  BP 128/74 (BP Location: Left Arm, Patient Position: Sitting, Cuff Size: Large)   Pulse 72   Ht 6' (1.829 m)   Wt 222 lb 12.8 oz (101.1 kg)   SpO2 98%   BMI 30.22 kg/m      General: Alert, oriented x3, no distress, borderline obese Head: no evidence of trauma, PERRL, EOMI, no exophtalmos or lid lag, no myxedema, no xanthelasma; normal ears, nose and oropharynx Neck: normal jugular venous pulsations and no hepatojugular reflux; brisk carotid pulses without delay and no carotid bruits Chest: clear to auscultation, no signs of consolidation by percussion or palpation, normal fremitus, symmetrical and full respiratory excursions Cardiovascular:  normal position and quality of the apical impulse, regular rhythm, normal first and widely split  second heart sounds, no murmurs, rubs or gallops Abdomen: no tenderness or distention, no masses by palpation, no abnormal pulsatility or arterial bruits, normal bowel sounds, no hepatosplenomegaly Extremities: no clubbing, cyanosis or edema Neurological: grossly nonfocal Psych: Normal mood and affect    Wt Readings from Last 3 Encounters:  08/05/22 222 lb 12.8 oz (101.1 kg)  09/18/21 217 lb 12.8 oz (98.8 kg)  09/09/21 215 lb (97.5 kg)      Studies/Labs Reviewed:   EKG:  EKG is ordered today.  It shows normal sinus rhythm, left atrial abnormality, right bundle branch block (new), old inferior MI, no acute ischemic repolarization abnormalities.  QTc 455 ms.   Recent Labs: July 2017 glucose 197, hemoglobin 13.9, creatinine 0.92, BUN 22, platelets 191 (Dr. Gerarda Fraction) 12/05/2021 hemoglobin 13.6, creatinine 1.52, potassium 5.1, ALT 32, TSH 1.51 Lipid Panel    Component Value Date/Time   CHOL 150 08/28/2016 0749   TRIG 218 (H) 08/28/2016 0749   HDL 32 (L) 08/28/2016 0749   CHOLHDL 4.7 08/28/2016 0749   VLDL 44 (H) 08/28/2016 0749   LDLCALC 74 08/28/2016 0749   July 2017: LDL cholesterol 78, total cholesterol 165, but also triglycerides 244 and borderline HDL 38. 12/05/2021 cholesterol 148, HDL 28, LDL 76, triglycerides 268 ASSESSMENT:    1. Coronary artery disease involving native coronary artery of native heart without angina pectoris   2. PAD (peripheral artery disease) ([place])   3. Non-insulin dependent type 2 diabetes mellitus ([place])   4. Essential hypertension   5. Mixed hyperlipidemia   6. RBBB      PLAN:  In order of problems listed above: CAD: More than 6 years after bypass surgery he remains asymptomatic.  He is on chronic dual antiplatelet therapy, beta-blocker, lipid-lowering medications. PAD: He has cramps at rest, but does not have intermittent claudication.  He has mostly infrapopliteal disease.  Will keep on long-term dual antiplatelet therapy. DM: Adequate control.   Reviewed the importance of physical exercise and carbohydrate restricted diet in addition to medications.  It would be preferable for him to be on an SGLT2 inhibitor or GLP-1 agonist rather than a sulfonylurea. HLP: LDL is close to target range, but his triglycerides and HDL cholesterol are indicative of insulin resistance.  Encouraged him to lose weight and to go for regular walks or perform other type of regular exercise.  Avoid additional weight gain.   HTN: Well-controlled on a combination of ramipril, amlodipine, metoprolol.  Continue current medications.  Make sure to check his blood pressure at home when he is fully relaxed, sitting down for at least 10 minutes. RBBB: New abnormality, but without clinical consequences.  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  We discussed the long-term possibility that he will develop AV block and what symptoms to watch out for. Abnormal creatinine: Labs were drawn when he was taking NSAIDs for her joint issues.  Encouraged him to drink more fluids.  Avoid NSAIDs as much as possible in the future.  Planned to have repeat labs with PCP.   Medication Adjustments/Labs and Tests Ordered: Current medicines are reviewed at length with the patient today.  Concerns regarding medicines are outlined above.  Medication changes, Labs and Tests ordered today are listed in the Patient Instructions below. Patient Instructions  Medication Instructions:  No changes   *If you need a refill on your cardiac medications before your next appointment,  please call your pharmacy*   Lab Work:  Not needed   Testing/Procedures: Not needed   Follow-Up: At [facility], you and your health needs are our priority.  As part of our continuing mission to provide you with exceptional heart care, we have created designated Provider Care Teams.  These Care Teams include your primary Cardiologist (physician) and Advanced Practice Providers (APPs -  Physician Assistants and  Nurse Practitioners) who all work together to provide you with the care you need, when you need it.     Your next appointment:   12 month(s)  The format for your next appointment:   In Person  Provider:   Sanda Klein, MD       Signed, Sanda Klein, MD  08/05/2022 8:50 AM    [facility] [address], [place], [place]  [address] Phone: [phone]; Fax: [phone]

## 2022-08-08 ENCOUNTER — Ambulatory Visit (INDEPENDENT_AMBULATORY_CARE_PROVIDER_SITE_OTHER): Payer: Medicare HMO

## 2022-08-08 ENCOUNTER — Other Ambulatory Visit: Payer: Self-pay

## 2022-08-08 ENCOUNTER — Emergency Department (HOSPITAL_COMMUNITY): Payer: Medicare HMO

## 2022-08-08 ENCOUNTER — Encounter (HOSPITAL_COMMUNITY): Payer: Self-pay | Admitting: Emergency Medicine

## 2022-08-08 ENCOUNTER — Inpatient Hospital Stay (HOSPITAL_COMMUNITY)
Admission: EM | Admit: 2022-08-08 | Discharge: 2022-08-11 | DRG: 683 | Disposition: A | Discharge status: Home / Self Care | Payer: Medicare HMO | Attending: Family Medicine | Admitting: Family Medicine

## 2022-08-08 ENCOUNTER — Encounter: Payer: Self-pay | Admitting: Emergency Medicine

## 2022-08-08 ENCOUNTER — Ambulatory Visit
Admission: EM | Admit: 2022-08-08 | Discharge: 2022-08-08 | Disposition: A | Discharge status: Home / Self Care | Payer: Medicare HMO | Attending: Nurse Practitioner | Admitting: Nurse Practitioner

## 2022-08-08 DIAGNOSIS — R109 Unspecified abdominal pain: Secondary | ICD-10-CM

## 2022-08-08 DIAGNOSIS — I10 Essential (primary) hypertension: Secondary | ICD-10-CM | POA: Diagnosis not present

## 2022-08-08 DIAGNOSIS — Z951 Presence of aortocoronary bypass graft: Secondary | ICD-10-CM

## 2022-08-08 DIAGNOSIS — E860 Dehydration: Secondary | ICD-10-CM | POA: Diagnosis present

## 2022-08-08 DIAGNOSIS — K56600 Partial intestinal obstruction, unspecified as to cause: Secondary | ICD-10-CM | POA: Diagnosis not present

## 2022-08-08 DIAGNOSIS — E119 Type 2 diabetes mellitus without complications: Secondary | ICD-10-CM | POA: Diagnosis not present

## 2022-08-08 DIAGNOSIS — K219 Gastro-esophageal reflux disease without esophagitis: Secondary | ICD-10-CM | POA: Diagnosis not present

## 2022-08-08 DIAGNOSIS — N200 Calculus of kidney: Secondary | ICD-10-CM | POA: Diagnosis not present

## 2022-08-08 DIAGNOSIS — I959 Hypotension, unspecified: Secondary | ICD-10-CM | POA: Diagnosis present

## 2022-08-08 DIAGNOSIS — E1151 Type 2 diabetes mellitus with diabetic peripheral angiopathy without gangrene: Secondary | ICD-10-CM | POA: Diagnosis present

## 2022-08-08 DIAGNOSIS — R112 Nausea with vomiting, unspecified: Secondary | ICD-10-CM

## 2022-08-08 DIAGNOSIS — Z7982 Long term (current) use of aspirin: Secondary | ICD-10-CM | POA: Diagnosis not present

## 2022-08-08 DIAGNOSIS — K56609 Unspecified intestinal obstruction, unspecified as to partial versus complete obstruction: Secondary | ICD-10-CM

## 2022-08-08 DIAGNOSIS — Z7902 Long term (current) use of antithrombotics/antiplatelets: Secondary | ICD-10-CM

## 2022-08-08 DIAGNOSIS — I2510 Atherosclerotic heart disease of native coronary artery without angina pectoris: Secondary | ICD-10-CM | POA: Diagnosis present

## 2022-08-08 DIAGNOSIS — N179 Acute kidney failure, unspecified: Secondary | ICD-10-CM | POA: Diagnosis not present

## 2022-08-08 DIAGNOSIS — K529 Noninfective gastroenteritis and colitis, unspecified: Secondary | ICD-10-CM | POA: Diagnosis present

## 2022-08-08 DIAGNOSIS — A419 Sepsis, unspecified organism: Secondary | ICD-10-CM | POA: Diagnosis not present

## 2022-08-08 DIAGNOSIS — Z79899 Other long term (current) drug therapy: Secondary | ICD-10-CM

## 2022-08-08 DIAGNOSIS — R197 Diarrhea, unspecified: Secondary | ICD-10-CM | POA: Diagnosis not present

## 2022-08-08 DIAGNOSIS — E785 Hyperlipidemia, unspecified: Secondary | ICD-10-CM | POA: Diagnosis present

## 2022-08-08 DIAGNOSIS — Z8249 Family history of ischemic heart disease and other diseases of the circulatory system: Secondary | ICD-10-CM | POA: Diagnosis not present

## 2022-08-08 DIAGNOSIS — R1084 Generalized abdominal pain: Secondary | ICD-10-CM | POA: Diagnosis not present

## 2022-08-08 DIAGNOSIS — Z7984 Long term (current) use of oral hypoglycemic drugs: Secondary | ICD-10-CM | POA: Diagnosis not present

## 2022-08-08 DIAGNOSIS — E11649 Type 2 diabetes mellitus with hypoglycemia without coma: Secondary | ICD-10-CM | POA: Diagnosis not present

## 2022-08-08 DIAGNOSIS — I739 Peripheral vascular disease, unspecified: Secondary | ICD-10-CM | POA: Diagnosis present

## 2022-08-08 DIAGNOSIS — Z4682 Encounter for fitting and adjustment of non-vascular catheter: Secondary | ICD-10-CM | POA: Diagnosis not present

## 2022-08-08 LAB — URINALYSIS, ROUTINE W REFLEX MICROSCOPIC
Bacteria, UA: NONE SEEN
Bilirubin Urine: NEGATIVE
Glucose, UA: NEGATIVE mg/dL
Hgb urine dipstick: NEGATIVE
Ketones, ur: NEGATIVE mg/dL
Leukocytes,Ua: NEGATIVE
Nitrite: NEGATIVE
Protein, ur: 30 mg/dL — AB
Specific Gravity, Urine: 1.02 (ref 1.005–1.030)
pH: 5 (ref 5.0–8.0)

## 2022-08-08 LAB — COMPREHENSIVE METABOLIC PANEL
ALT: 37 U/L (ref 0–44)
AST: 34 U/L (ref 15–41)
Albumin: 4.2 g/dL (ref 3.5–5.0)
Alkaline Phosphatase: 58 U/L (ref 38–126)
Anion gap: 13 (ref 5–15)
BUN: 79 mg/dL — ABNORMAL HIGH (ref 8–23)
CO2: 18 mmol/L — ABNORMAL LOW (ref 22–32)
Calcium: 9.2 mg/dL (ref 8.9–10.3)
Chloride: 100 mmol/L (ref 98–111)
Creatinine, Ser: 4.59 mg/dL — ABNORMAL HIGH (ref 0.61–1.24)
GFR, Estimated: 13 mL/min — ABNORMAL LOW (ref >60)
Glucose, Bld: 139 mg/dL — ABNORMAL HIGH (ref 70–99)
Potassium: 4.7 mmol/L (ref 3.5–5.1)
Sodium: 131 mmol/L — ABNORMAL LOW (ref 135–145)
Total Bilirubin: 0.5 mg/dL (ref 0.3–1.2)
Total Protein: 8.1 g/dL (ref 6.5–8.1)

## 2022-08-08 LAB — CBC WITH DIFFERENTIAL/PLATELET
Abs Immature Granulocytes: 0.02 10*3/uL (ref 0.00–0.07)
Basophils Absolute: 0 10*3/uL (ref 0.0–0.1)
Basophils Relative: 0 %
Eosinophils Absolute: 0 10*3/uL (ref 0.0–0.5)
Eosinophils Relative: 0 %
HCT: 49.6 % (ref 39.0–52.0)
Hemoglobin: 16.2 g/dL (ref 13.0–17.0)
Immature Granulocytes: 0 %
Lymphocytes Relative: 22 %
Lymphs Abs: 1.7 10*3/uL (ref 0.7–4.0)
MCH: 30.2 pg (ref 26.0–34.0)
MCHC: 32.7 g/dL (ref 30.0–36.0)
MCV: 92.4 fL (ref 80.0–100.0)
Monocytes Absolute: 1 10*3/uL (ref 0.1–1.0)
Monocytes Relative: 12 %
Neutro Abs: 5.1 10*3/uL (ref 1.7–7.7)
Neutrophils Relative %: 66 %
Platelets: 220 10*3/uL (ref 150–400)
RBC: 5.37 MIL/uL (ref 4.22–5.81)
RDW: 13.7 % (ref 11.5–15.5)
WBC: 7.8 10*3/uL (ref 4.0–10.5)
nRBC: 0 % (ref 0.0–0.2)

## 2022-08-08 LAB — LACTIC ACID, PLASMA
Lactic Acid, Venous: 1.8 mmol/L (ref 0.5–1.9)
Lactic Acid, Venous: 2.1 mmol/L (ref 0.5–1.9)
Lactic Acid, Venous: 1.8 mmol/L (ref 0.5–1.9)

## 2022-08-08 LAB — GLUCOSE, CAPILLARY: Glucose-Capillary: 97 mg/dL (ref 70–99)

## 2022-08-08 LAB — POCT INFLUENZA A/B
Influenza A, POC: NEGATIVE
Influenza B, POC: NEGATIVE

## 2022-08-08 LAB — PROTIME-INR
INR: 1 (ref 0.8–1.2)
Prothrombin Time: 12.7 seconds (ref 11.4–15.2)

## 2022-08-08 LAB — LIPASE, BLOOD: Lipase: 67 U/L — ABNORMAL HIGH (ref 11–51)

## 2022-08-08 LAB — APTT: aPTT: 28 seconds (ref 24–36)

## 2022-08-08 MED ORDER — FENTANYL CITRATE PF 50 MCG/ML IJ SOSY
25.0000 ug | PREFILLED_SYRINGE | Freq: Once | INTRAMUSCULAR | Status: AC
  Administered 2022-08-08: 25 ug via INTRAVENOUS
  Filled 2022-08-08: qty 1

## 2022-08-08 MED ORDER — INSULIN ASPART 100 UNIT/ML IJ SOLN
0.0000 [IU] | Freq: Four times a day (QID) | INTRAMUSCULAR | Status: DC
  Administered 2022-08-09: 1 [IU] via SUBCUTANEOUS
  Administered 2022-08-09: 3 [IU] via SUBCUTANEOUS
  Administered 2022-08-09 – 2022-08-10 (×2): 1 [IU] via SUBCUTANEOUS
  Administered 2022-08-10 (×3): 3 [IU] via SUBCUTANEOUS
  Administered 2022-08-11: 2 [IU] via SUBCUTANEOUS

## 2022-08-08 MED ORDER — ONDANSETRON HCL 4 MG PO TABS: 4.0000 mg | ORAL_TABLET | Freq: Four times a day (QID) | ORAL | Status: DC | PRN

## 2022-08-08 MED ORDER — LACTATED RINGERS IV BOLUS
1000.0000 mL | Freq: Once | INTRAVENOUS | Status: AC
  Administered 2022-08-08: 1000 mL via INTRAVENOUS

## 2022-08-08 MED ORDER — HEPARIN SODIUM (PORCINE) 5000 UNIT/ML IJ SOLN
5000.0000 [IU] | Freq: Three times a day (TID) | INTRAMUSCULAR | Status: DC
  Administered 2022-08-08 – 2022-08-11 (×9): 5000 [IU] via SUBCUTANEOUS
  Filled 2022-08-08 (×9): qty 1

## 2022-08-08 MED ORDER — SODIUM CHLORIDE 0.9 % IV BOLUS
1000.0000 mL | Freq: Once | INTRAVENOUS | Status: AC
  Administered 2022-08-08: 1000 mL via INTRAVENOUS

## 2022-08-08 MED ORDER — ONDANSETRON HCL 4 MG/2ML IJ SOLN: 4.0000 mg | Freq: Four times a day (QID) | INTRAMUSCULAR | Status: DC | PRN

## 2022-08-08 MED ORDER — LACTATED RINGERS IV BOLUS (SEPSIS)
1000.0000 mL | Freq: Once | INTRAVENOUS | Status: AC
  Administered 2022-08-08: 1000 mL via INTRAVENOUS

## 2022-08-08 MED ORDER — ACETAMINOPHEN 325 MG PO TABS: 650.0000 mg | ORAL_TABLET | Freq: Four times a day (QID) | ORAL | Status: DC | PRN

## 2022-08-08 MED ORDER — HYDROMORPHONE HCL 1 MG/ML IJ SOLN
0.5000 mg | INTRAMUSCULAR | Status: DC | PRN
  Filled 2022-08-08: qty 0.5

## 2022-08-08 MED ORDER — ACETAMINOPHEN 650 MG RE SUPP: 650.0000 mg | Freq: Four times a day (QID) | RECTAL | Status: DC | PRN

## 2022-08-08 MED ORDER — SODIUM CHLORIDE 0.9 % IV SOLN: INTRAVENOUS | Status: DC

## 2022-08-08 NOTE — ED Provider Notes (Signed)
[facility] Provider Note   CSN: [id number] Arrival date & time: 08/08/22  1108     History  Chief Complaint  Patient presents with   Abdominal Pain    Jonathan Shepard is a 72 y.o. male.   Abdominal Pain Associated symptoms: diarrhea, fatigue, nausea and vomiting   Associated symptoms: no chest pain, no chills, no cough, no fever and no shortness of breath        Jonathan Shepard is a 72 y.o. male with past medical history of coronary artery disease, type 2 diabetes, hypertension who presents to the Emergency Department complaining of abdominal pain, nausea, vomiting, diarrhea.  Pain began Wednesday evening, was associated with vomiting and diarrhea.  Vomiting persisted through Thursday, he has not had any vomiting since that time.  He continues to have some loose watery stools that are nonbloody or black.  He describes his abdominal pain as generalized with some pain into his lower back and has generalized weakness.  He was seen earlier today at urgent care and advised to come to the emergency department for further evaluation.  He denies fever, chest pain, shortness of breath, flank pain or dysuria.  No history of previous bowel obstruction or abdominal surgeries.    Home Medications Prior to Admission medications   Medication Sig Start Date End Date Taking? Authorizing Provider  amLODipine (NORVASC) 10 MG tablet Take 1 tablet (10 mg total) by mouth daily. 08/03/22 07/29/23  Croitoru, Dani Gobble, MD  aspirin EC 81 MG tablet Take 81 mg by mouth every morning.     [provider]  clopidogrel (PLAVIX) 75 MG tablet Take 1 tablet (75 mg total) by mouth daily. 05/10/19   Croitoru, Mihai, MD  Co-Enzyme Q-10 100 MG CAPS Take 100 mg by mouth daily.     [provider]  diclofenac (CATAFLAM) 50 MG tablet Take 50 mg by mouth 2 (two) times daily. 07/09/22   [provider]  diclofenac (VOLTAREN) 75 MG EC tablet Take 75 mg by  mouth 2 (two) times daily.    [provider]  fenofibrate 54 MG tablet TAKE 1 TABLET EVERY DAY Patient taking differently: Take 54 mg by mouth daily. 05/10/18   Erlene Quan, PA-C  glimepiride (AMARYL) 2 MG tablet Take 2 mg by mouth 2 (two) times daily. 06/27/17   [provider]  HYDROcodone-acetaminophen (NORCO) 10-325 MG tablet Take 1 tablet by mouth every 6 (six) hours as needed for moderate pain.  Patient not taking: Reported on 08/05/2022 02/22/18   [provider]  metFORMIN (GLUCOPHAGE) 1000 MG tablet Take 1,000 mg by mouth 2 (two) times daily with a meal.    [provider]  metoprolol tartrate (LOPRESSOR) 25 MG tablet Take 1 tablet (25 mg total) by mouth 2 (two) times daily. 07/27/18   Croitoru, Mihai, MD  Multiple Vitamin (MULTIVITAMIN) tablet Take 1 tablet by mouth daily.    [provider]  Omega-3 Fatty Acids (FISH OIL) 1200 MG CAPS Take 1,200 mg by mouth 3 (three) times daily. Take 2400 mg by mouth in the morning and 1200 mg in the evening    [provider]  Oxycodone HCl 10 MG TABS Take 10 mg by mouth daily as needed (breakthrough pain).  Patient not taking: Reported on 08/05/2022 07/08/18   [provider]  pantoprazole (PROTONIX) 40 MG tablet TAKE 1 TABLET EVERY DAY Patient taking differently: Take 40 mg by mouth daily. 05/10/18   Kilroy,  Luke K, PA-C  pravastatin (PRAVACHOL) 80 MG tablet TAKE 1 TABLET EVERY DAY 06/09/22   Croitoru, Mihai, MD  ramipril (ALTACE) 10 MG capsule Take 1 capsule (10 mg total) by mouth daily. 07/30/22   Croitoru, Dani Gobble, MD      Allergies    Patient has no known allergies.    Review of Systems   Review of Systems  Constitutional:  Positive for appetite change and fatigue. Negative for chills and fever.  Respiratory:  Negative for cough and shortness of breath.   Cardiovascular:  Negative for chest pain.  Gastrointestinal:  Positive for abdominal pain, diarrhea, nausea and vomiting. Negative  for abdominal distention and blood in stool.  Neurological:  Positive for weakness. Negative for dizziness, numbness and headaches.  Psychiatric/Behavioral:  Negative for confusion.     Physical Exam Updated Vital Signs BP 97/60   Pulse 73   Temp 97.6 F (36.4 C) (Oral)   Resp 17   Ht 6' (1.829 m)   Wt 100.7 kg   SpO2 100%   BMI 30.11 kg/m  Physical Exam Vitals and nursing note reviewed.  Constitutional:      General: He is not in acute distress.    Appearance: He is well-developed. He is not ill-appearing or toxic-appearing.  HENT:     Mouth/Throat:     Mouth: Mucous membranes are dry.  Cardiovascular:     Rate and Rhythm: Normal rate and regular rhythm.     Pulses: Normal pulses.  Pulmonary:     Effort: Pulmonary effort is normal.  Chest:     Chest wall: No tenderness.  Abdominal:     Palpations: Abdomen is soft.     Tenderness: There is abdominal tenderness.     Comments: Generalized abdominal tenderness without guarding or rebound.  Abdomen soft.  Musculoskeletal:        General: Normal range of motion.     Cervical back: Normal range of motion.     Right lower leg: No edema.     Left lower leg: No edema.  Lymphadenopathy:     Cervical: No cervical adenopathy.  Skin:    General: Skin is warm.     Capillary Refill: Capillary refill takes less than 2 seconds.  Neurological:     General: No focal deficit present.     Mental Status: He is alert.     Sensory: No sensory deficit.     Motor: No weakness.     ED Results / Procedures / Treatments   Labs (all labs ordered are listed, but only abnormal results are displayed) Labs Reviewed  LACTIC ACID, PLASMA - Abnormal; Notable for the following components:      Result Value   Lactic Acid, Venous 2.1 (*)    All other components within normal limits  COMPREHENSIVE METABOLIC PANEL - Abnormal; Notable for the following components:   Sodium 131 (*)    CO2 18 (*)    Glucose, Bld 139 (*)    BUN 79 (*)     Creatinine, Ser 4.59 (*)    GFR, Estimated 13 (*)    All other components within normal limits  LIPASE, BLOOD - Abnormal; Notable for the following components:   Lipase 67 (*)    All other components within normal limits  CULTURE, BLOOD (ROUTINE X 2)  CULTURE, BLOOD (ROUTINE X 2)  LACTIC ACID, PLASMA  CBC WITH DIFFERENTIAL/PLATELET  PROTIME-INR  APTT  URINALYSIS, W/ REFLEX TO CULTURE (INFECTION SUSPECTED)    EKG None  Radiology CT ABDOMEN PELVIS WO CONTRAST  Result Date: 08/08/2022 CLINICAL DATA:  Abdominal pain, acute, nonlocalized abd x 3 days, V/D EXAM: CT ABDOMEN AND PELVIS WITHOUT CONTRAST TECHNIQUE: Multidetector CT imaging of the abdomen and pelvis was performed following the standard protocol without IV contrast. RADIATION DOSE REDUCTION: This exam was performed according to the departmental dose-optimization program which includes automated exposure control, adjustment of the mA and/or kV according to patient size and/or use of iterative reconstruction technique. COMPARISON:  August 04, 2017 FINDINGS: Evaluation is limited due to lack of IV contrast. Lower chest: Similar rounded opacity at the LEFT lung base, most consistent with rounded atelectasis. RIGHT upper lobe pulmonary nodule measures 3 mm (series 4, image 1). Hepatobiliary: Hepatic steatosis.  Cholelithiasis. Pancreas: No peripancreatic fat stranding. Spleen: Unremarkable. Adrenals/Urinary Tract: Adrenal glands are unremarkable. Punctate nonobstructing bilateral nephrolithiasis, the largest of which measures 4 mm in the superior pole of the RIGHT kidney. No obstructing nephrolithiasis. Bladder is decompressed. Stomach/Bowel: There are multiple mildly dilated loops small bowel in the mid abdomen without discrete transition point. Bowel loops gently taper in the RIGHT lower quadrant (series 2, image 73). Representative loop of small bowel measures approximately 4 cm in diameter. Appendix is normal. Stomach is decompressed.  Vascular/Lymphatic: Atherosclerotic calcifications of the nonaneurysmal abdominal aorta. There are prominent mesenteric lymph nodes without frank adenopathy by size criteria. Reproductive: Coarse calcifications of the prostate. Other: Small fat containing RIGHT inguinal hernia. No free air or free fluid. Musculoskeletal: No acute or significant osseous findings. IMPRESSION: 1. There are multiple mildly dilated loops of small bowel in the mid abdomen without discrete transition point. Bowel loops gently taper in the RIGHT lower quadrant. Findings could reflect a nonspecific enteritis/ileus or partial small bowel obstruction. 2. Hepatic steatosis. 3. There is a 3 mm RIGHT upper lobe pulmonary nodule. Per Fleischner Society Guidelines, if patient is low risk for malignancy, no routine follow-up imaging is recommended. If patient is high risk for malignancy, a non-contrast Chest CT at 12 months is optional. If performed and the nodule is stable at 12 months, no further follow-up is recommended. These guidelines do not apply to immunocompromised patients and patients with cancer. Follow up in patients with significant comorbidities as clinically warranted. For lung cancer screening, adhere to Lung-RADS guidelines. Reference: Radiology. 2017; 284(1):228-43. Aortic Atherosclerosis (ICD10-I70.0). Electronically Signed   By: Valentino Saxon M.D.   On: 08/08/2022 14:01   DG Chest Port 1 View  Result Date: 08/08/2022 CLINICAL DATA:  Sepsis. EXAM: PORTABLE CHEST 1 VIEW COMPARISON:  08/04/2017 FINDINGS: The lungs are clear without focal pneumonia, edema, pneumothorax or pleural effusion. Cardiopericardial silhouette is at upper limits of normal for size. Status post CABG. The visualized bony structures of the thorax are unremarkable. IMPRESSION: No active disease. Electronically Signed   By: Misty Stanley M.D.   On: 08/08/2022 13:05   DG Abd 2 Views  Result Date: 08/08/2022 CLINICAL DATA:  Abdominal pain for 5 days with  diarrhea. EXAM: ABDOMEN - 2 VIEW COMPARISON:  None Available. FINDINGS: Lung bases are normal. No free air, portal venous gas, or pneumatosis. Dilated loops of small bowel are identified primarily in the left side of the abdomen. There is a paucity of colonic gas. Probable right renal stones. No other significant abnormalities are identified. IMPRESSION: Small bowel obstruction. Recommend CT imaging for better evaluation. Right renal stone suspected. These results will be called to the ordering clinician or representative by the Radiologist Assistant, and communication documented in the PACS or [facility]. Electronically Signed   By: Dorise Bullion III M.D.   On: 08/08/2022 10:53    Procedures Procedures    Medications Ordered in ED Medications  lactated ringers bolus 1,000 mL (has no administration in time range)  lactated ringers bolus 1,000 mL (0 mLs Intravenous Stopped 08/08/22 1452)  fentaNYL (SUBLIMAZE) injection 25 mcg (25 mcg Intravenous Given 08/08/22 1400)    ED Course/ Medical Decision Making/ A&P                             Medical Decision Making Patient here for evaluation of abdominal pain, nausea, vomiting, diarrhea.  Symptoms present x 3 days.  Continues to have abdominal pain and watery stools without melena or hematochezia.  Abdominal pain is described as diffuse.  Has had diminished appetite and admits to decreased p.o. intake.  No history of prior abdominal surgeries  On my exam, patient has diffuse abdominal redness without guarding or rebound.  Abdomen is soft.  His mucous membranes are very dry and clinically appears dehydrated.  Vital signs reviewed, blood pressure soft on arrival.  He does take 3 antihypertensive medications daily patient's wife who is at bedside states that his blood pressures are normally not low, but she did give him all 3 of his blood pressure medications earlier today.  His differential would include but not limited to small bowel obstruction,  colitis, acute diverticulitis, acute pancreatitis, enteritis.  With soft blood pressure, clinical presentation of dehydration and abdominal pain, sepsis is also highly considered.  Will start evolving sepsis protocol with labs, IV fluids and CT of the abdomen and pelvis.    Amount and/or Complexity of Data Reviewed Labs: ordered.    Details: Labs interpreted by me, no evidence of leukocytosis, hemoglobin unremarkable.  Chemistries show AKI with BUN of 79 serum creatinine 4.59.  No prior chemistries available for comparison, but patient does not have history of kidney disease.  Lipase mildly elevated at 67.  Initial lactic acid 1.8, second lactic elevated at 2.1 felt to be secondary to dehydration Radiology: ordered.    Details: Reviewed imaging ordered at urgent care and it shows concern for SBO CT abdomen and pelvis ordered here for further evaluation.  Shows multiple mildly dilated loops of small bowel with no discrete transition point ECG/medicine tests: ordered.    Details: EG shows sinus rhythm with right bundle branch block Discussion of management or test interpretation with external provider(s): Patient here with AKI and possible enteritis versus partial small bowel obstruction.  He has received pain medication, IV fluids and on recheck resting comfortably states his pain is improved.  His blood pressures initially were soft but appear to be improving with the fluids.  Afebrile and his initial lactic acid was unremarkable.  I have consulted with general surgery, Dr. Okey Dupre and discussed findings.  She recommends the patient remain n.p.o. for now.  Patient not currently vomiting so will hold NG tube for now.  Discussed findings with [facility] hospitalist, Dr. Denton Brick who is agreeable to admit.   Risk Prescription drug management.           Final Clinical Impression(s) / ED Diagnoses Final diagnoses:  AKI (acute kidney injury) ([place])  Generalized abdominal pain    Rx / DC  Orders ED Discharge Orders     None         Kem Parkinson, PA-C 08/08/22 1548    Fredia Sorrow, MD 08/09/22 [phone]

## 2022-08-08 NOTE — ED Triage Notes (Signed)
Pt reports having N/V/D x3 days.  States he feels really weak.  Went to urgent care this morning & they sent him here for CT to rule out obstruction.

## 2022-08-08 NOTE — ED Notes (Signed)
Requested urine sample from pt. Pt unable to urinate at this time

## 2022-08-08 NOTE — ED Notes (Signed)
Patient is being discharged from the Urgent Care and sent to the Emergency Department via private vehicle . Per NP, patient is in need of higher level of care due to small bowel obstruction. Patient is aware and verbalizes understanding of plan of care.  Vitals:   08/08/22 1023  BP: 100/62  Pulse: 81  Resp: 18  Temp: 97.9 F (36.6 C)  SpO2: 97%

## 2022-08-08 NOTE — Discharge Instructions (Signed)
Go to the emergency department for further evaluation. 

## 2022-08-08 NOTE — Assessment & Plan Note (Addendum)
Creatinine 4.59, last checked 5 years ago- 2019, creatinine was 0.99.  Likely.  Now from  GI losses- vomiting and diarrhea, in the setting of ACE inhibitor ramipril.  CT abdomen showed-punctate nonobstructing bilateral left for lithiasis, largest in the superior pole of right kidney measuring 4 mm.  No obstructing nephrolithiasis.   - Give 3rd L bolus, as still no urine output, then continue N/s 100cc/hr x 1 day -Hold ramipril

## 2022-08-08 NOTE — ED Notes (Signed)
Requested urine sample from patient. Pt unable to urinate at this time

## 2022-08-08 NOTE — H&P (Addendum)
History and Physical    Jonathan Shepard [id number] DOB: 04-16-1951 DOA: 08/08/2022  PCP: Redmond School, MD   Patient coming from: Home  I have personally briefly reviewed patient's old medical records in [facility]  Chief Complaint: Vomiting, Diarrhea  HPI: Jonathan Shepard is a 72 y.o. male with medical history significant for CABG, HTN, DM.  Patient presented to the ED with complaints of diffuse abdominal pain, nausea and vomiting.  Symptoms started 3 days ago on Wednesday 1/28.  Initially had at least 8-9 episodes each of both vomiting and diarrhea.  Vomiting resolved by the next day, but he has had persistent diarrhea.  He had 1 loose bowel movement today.  No fevers.  He has slightly reduced urinary output, otherwise no urinary symptoms. No recent antibiotics. Presented to urgent care, x-ray showed SBO, right kidney stone.  He was referred to the ED.  ED Course: Blood pressure down to 86/62 improved after IV fluids to 99/58.  Fattening elevated at 4.59.  Lactic acid 1.8 > 0.1.  WBC 7.8. CT abdomen and pelvis-multiple mildly dilated loops of small bowel in the mid abdomen without discrete transition point.  Nonspecific enteritis/ileus or partial small bowel obstruction. 2 L bolus given in ED.  Review of Systems: As per HPI all other systems reviewed and negative.  Past Medical History:  Diagnosis Date   CAD (coronary artery disease)    Diabetes ([place])    Dyspnea    GERD (gastroesophageal reflux disease)    Hyperlipidemia    Hypertension    Joint pain    hand, related to work   Obesity     Past Surgical History:  Procedure Laterality Date   CARDIAC CATHETERIZATION N/A 04/14/2016   Procedure: Left Heart Cath and Coronary Angiography;  Surgeon: Peter M Martinique, MD;  Location: [place] [facility];  Service: Cardiovascular;  Laterality: N/A;   COLONOSCOPY WITH PROPOFOL N/A 01/09/2019   Procedure: COLONOSCOPY WITH PROPOFOL;  Surgeon: Daneil Dolin, MD;  [facility];  Service: Endoscopy;  Laterality: N/A;  8:30am   CORONARY ARTERY BYPASS GRAFT N/A 04/22/2016   Procedure: CORONARY ARTERY BYPASS GRAFTING (CABG)x4 with endoscopic harvesting of right saphenous vein SVG to diagonal SVG to PDA Left Radial artery to OM 2 LIMA to LAD;  Surgeon: Melrose Nakayama, MD;  Location: [facility];  Service: Open Heart Surgery;  Laterality: N/A;   RADIAL ARTERY HARVEST Left 04/22/2016   Procedure: LEFT RADIAL ARTERY HARVEST;  Surgeon: Melrose Nakayama, MD;  Location: [facility];  Service: Open Heart Surgery;  Laterality: Left;   TEE WITHOUT CARDIOVERSION N/A 04/22/2016   Procedure: TRANSESOPHAGEAL ECHOCARDIOGRAM (TEE);  Surgeon: Melrose Nakayama, MD;  Location: [facility];  Service: Open Heart Surgery;  Laterality: N/A;     reports that he has never smoked. He has never used smokeless tobacco. He reports that he does not drink alcohol and does not use drugs.  No Known Allergies  Family History  Problem Relation Age of Onset   Coronary artery disease Father 84       CABG   Cancer Father    Coronary artery disease Brother 77       CABG   Colon cancer Neg Hx    Colon polyps Neg Hx      Prior to Admission medications   Medication Sig Start Date End Date Taking? Authorizing Provider  amLODipine (NORVASC) 10 MG tablet Take 1 tablet (10 mg total) by mouth daily. 08/03/22 07/29/23  Croitoru, Mihai, MD  aspirin EC 81 MG tablet Take 81 mg by mouth every morning.     [provider]  clopidogrel (PLAVIX) 75 MG tablet Take 1 tablet (75 mg total) by mouth daily. 05/10/19   Croitoru, Mihai, MD  Co-Enzyme Q-10 100 MG CAPS Take 100 mg by mouth daily.     [provider]  diclofenac (CATAFLAM) 50 MG tablet Take 50 mg by mouth 2 (two) times daily. 07/09/22   [provider]  diclofenac (VOLTAREN) 75 MG EC tablet Take 75 mg by mouth 2 (two) times daily.    [provider]  fenofibrate 54 MG tablet TAKE 1 TABLET EVERY DAY Patient taking  differently: Take 54 mg by mouth daily. 05/10/18   Erlene Quan, PA-C  glimepiride (AMARYL) 2 MG tablet Take 2 mg by mouth 2 (two) times daily. 06/27/17   [provider]  HYDROcodone-acetaminophen (NORCO) 10-325 MG tablet Take 1 tablet by mouth every 6 (six) hours as needed for moderate pain.  Patient not taking: Reported on 08/05/2022 02/22/18   [provider]  metFORMIN (GLUCOPHAGE) 1000 MG tablet Take 1,000 mg by mouth 2 (two) times daily with a meal.    [provider]  metoprolol tartrate (LOPRESSOR) 25 MG tablet Take 1 tablet (25 mg total) by mouth 2 (two) times daily. 07/27/18   Croitoru, Mihai, MD  Multiple Vitamin (MULTIVITAMIN) tablet Take 1 tablet by mouth daily.    [provider]  Omega-3 Fatty Acids (FISH OIL) 1200 MG CAPS Take 1,200 mg by mouth 3 (three) times daily. Take 2400 mg by mouth in the morning and 1200 mg in the evening    [provider]  Oxycodone HCl 10 MG TABS Take 10 mg by mouth daily as needed (breakthrough pain).  Patient not taking: Reported on 08/05/2022 07/08/18   [provider]  pantoprazole (PROTONIX) 40 MG tablet TAKE 1 TABLET EVERY DAY Patient taking differently: Take 40 mg by mouth daily. 05/10/18   Erlene Quan, PA-C  pravastatin (PRAVACHOL) 80 MG tablet TAKE 1 TABLET EVERY DAY 06/09/22   Croitoru, Mihai, MD  ramipril (ALTACE) 10 MG capsule Take 1 capsule (10 mg total) by mouth daily. 07/30/22   Croitoru, Dani Gobble, MD    Physical Exam: Vitals:   08/08/22 1330 08/08/22 1400 08/08/22 1500 08/08/22 1515  BP: (!) '99/58 97/60 97/62 '$   Pulse: 67 73 70 69  Resp: '20 17 20 19  '$ Temp:      TempSrc:      SpO2: 100% 100% 97% 100%  Weight:      Height:        Constitutional: NAD, calm, comfortable Vitals:   08/08/22 1330 08/08/22 1400 08/08/22 1500 08/08/22 1515  BP: (!) '99/58 97/60 97/62 '$   Pulse: 67 73 70 69  Resp: '20 17 20 19  '$ Temp:      TempSrc:      SpO2: 100% 100% 97% 100%  Weight:      Height:        Eyes: PERRL, lids and conjunctivae normal ENMT: Mucous membranes are moist. Neck: normal, supple, no masses, no thyromegaly Respiratory: clear to auscultation bilaterally, no wheezing, no crackles. Normal respiratory effort. No accessory muscle use.  Cardiovascular: Regular rate and rhythm, no murmurs / rubs / gallops. No extremity edema.  Extremities warm. Abdomen: no tenderness, no masses palpated. No hepatosplenomegaly.  Musculoskeletal: no clubbing / cyanosis. No joint deformity upper and lower extremities.  Skin: no rashes, lesions, ulcers. No  induration Neurologic: No facial asymmetry, speech clear and fluent.  Moving extremity spontaneously. Psychiatric: Normal judgment and insight. Alert and oriented x 3. Normal mood.   Labs on Admission: I have personally reviewed following labs and imaging studies  CBC: Recent Labs  Lab 08/08/22 1213  WBC 7.8  NEUTROABS 5.1  HGB 16.2  HCT 49.6  MCV 92.4  PLT [id number]   Basic Metabolic Panel: Recent Labs  Lab 08/08/22 1213  NA 131*  K 4.7  CL 100  CO2 18*  GLUCOSE 139*  BUN 79*  CREATININE 4.59*  CALCIUM 9.2   GFR: Estimated Creatinine Clearance: 18.6 mL/min (A) (by C-G formula based on SCr of 4.59 mg/dL (H)). Liver Function Tests: Recent Labs  Lab 08/08/22 1213  AST 34  ALT 37  ALKPHOS 58  BILITOT 0.5  PROT 8.1  ALBUMIN 4.2   Recent Labs  Lab 08/08/22 1213  LIPASE 67*   No results for input(s): "AMMONIA" in the last 168 hours. Coagulation Profile: Recent Labs  Lab 08/08/22 1213  INR 1.0   Radiological Exams on Admission: CT ABDOMEN PELVIS WO CONTRAST  Result Date: 08/08/2022 CLINICAL DATA:  Abdominal pain, acute, nonlocalized abd x 3 days, V/D EXAM: CT ABDOMEN AND PELVIS WITHOUT CONTRAST TECHNIQUE: Multidetector CT imaging of the abdomen and pelvis was performed following the standard protocol without IV contrast. RADIATION DOSE REDUCTION: This exam was performed according to the departmental dose-optimization  program which includes automated exposure control, adjustment of the mA and/or kV according to patient size and/or use of iterative reconstruction technique. COMPARISON:  August 04, 2017 FINDINGS: Evaluation is limited due to lack of IV contrast. Lower chest: Similar rounded opacity at the LEFT lung base, most consistent with rounded atelectasis. RIGHT upper lobe pulmonary nodule measures 3 mm (series 4, image 1). Hepatobiliary: Hepatic steatosis.  Cholelithiasis. Pancreas: No peripancreatic fat stranding. Spleen: Unremarkable. Adrenals/Urinary Tract: Adrenal glands are unremarkable. Punctate nonobstructing bilateral nephrolithiasis, the largest of which measures 4 mm in the superior pole of the RIGHT kidney. No obstructing nephrolithiasis. Bladder is decompressed. Stomach/Bowel: There are multiple mildly dilated loops small bowel in the mid abdomen without discrete transition point. Bowel loops gently taper in the RIGHT lower quadrant (series 2, image 73). Representative loop of small bowel measures approximately 4 cm in diameter. Appendix is normal. Stomach is decompressed. Vascular/Lymphatic: Atherosclerotic calcifications of the nonaneurysmal abdominal aorta. There are prominent mesenteric lymph nodes without frank adenopathy by size criteria. Reproductive: Coarse calcifications of the prostate. Other: Small fat containing RIGHT inguinal hernia. No free air or free fluid. Musculoskeletal: No acute or significant osseous findings. IMPRESSION: 1. There are multiple mildly dilated loops of small bowel in the mid abdomen without discrete transition point. Bowel loops gently taper in the RIGHT lower quadrant. Findings could reflect a nonspecific enteritis/ileus or partial small bowel obstruction. 2. Hepatic steatosis. 3. There is a 3 mm RIGHT upper lobe pulmonary nodule. Per Fleischner Society Guidelines, if patient is low risk for malignancy, no routine follow-up imaging is recommended. If patient is high risk for  malignancy, a non-contrast Chest CT at 12 months is optional. If performed and the nodule is stable at 12 months, no further follow-up is recommended. These guidelines do not apply to immunocompromised patients and patients with cancer. Follow up in patients with significant comorbidities as clinically warranted. For lung cancer screening, adhere to Lung-RADS guidelines. Reference: Radiology. 2017; 284(1):228-43. Aortic Atherosclerosis (ICD10-I70.0). Electronically Signed   By: Valentino Saxon M.D.   On: 08/08/2022 14:01  DG Chest Port 1 View  Result Date: 08/08/2022 CLINICAL DATA:  Sepsis. EXAM: PORTABLE CHEST 1 VIEW COMPARISON:  08/04/2017 FINDINGS: The lungs are clear without focal pneumonia, edema, pneumothorax or pleural effusion. Cardiopericardial silhouette is at upper limits of normal for size. Status post CABG. The visualized bony structures of the thorax are unremarkable. IMPRESSION: No active disease. Electronically Signed   By: Misty Stanley M.D.   On: 08/08/2022 13:05   DG Abd 2 Views  Result Date: 08/08/2022 CLINICAL DATA:  Abdominal pain for 5 days with diarrhea. EXAM: ABDOMEN - 2 VIEW COMPARISON:  None Available. FINDINGS: Lung bases are normal. No free air, portal venous gas, or pneumatosis. Dilated loops of small bowel are identified primarily in the left side of the abdomen. There is a paucity of colonic gas. Probable right renal stones. No other significant abnormalities are identified. IMPRESSION: Small bowel obstruction. Recommend CT imaging for better evaluation. Right renal stone suspected. These results will be called to the ordering clinician or representative by the Radiologist Assistant, and communication documented in the PACS or [employer]. Electronically Signed   By: Dorise Bullion III M.D.   On: 08/08/2022 10:53    EKG: Independently reviewed.  Sinus rhythm rate 75, QTc 447.  Old RBBB.  No change from prior.  Assessment/Plan Principal Problem:   AKI (acute  kidney injury) ([place]) Active Problems:   Gastroenteritis   Non-insulin dependent type 2 diabetes mellitus ([place])   PAD (peripheral artery disease) ([facility])   S/P CABG x 4   HTN (hypertension)  Assessment and Plan: * AKI (acute kidney injury) ([place]) Creatinine 4.59, last checked 5 years ago- 2019, creatinine was 0.99.  Likely.  Now from  GI losses- vomiting and diarrhea, in the setting of ACE inhibitor ramipril.  CT abdomen showed-punctate nonobstructing bilateral left for lithiasis, largest in the superior pole of right kidney measuring 4 mm.  No obstructing nephrolithiasis.   - Give 3rd L bolus, as still no urine output, then continue N/s 100cc/hr x 1 day -Hold ramipril  Gastroenteritis Presented with abdominal pain, vomiting, nausea, diarrhea.  CT-suggest enteritis/ileus or partial small bowel obstruction.  Last bowel movement was this morning.  No recent antibiotics.  Rules out for sepsis.  Lactic acid 1.8 > 2.1 -Trend lactic acid -Bowel rest, n.p.o. - EDP talked to general surgery on-call, Dr. Okey Dupre, no need for NG tube at this time as no vomiting. - IVF -IV Dilaudid 0.5 mg every 4 hourly as needed -Zofran as needed  HTN (hypertension) Initial hypotension blood pressure 86/62.  Now improved on IV fluids. -Hold Norvasc, metoprolol, ramipril  S/P CABG x 4 Stable. -N.p.o. for now, hold aspirin, Plavix, statins  PAD (peripheral artery disease) ([facility]) Hold aspirin, statins, Plavix.  Non-insulin dependent type 2 diabetes mellitus ([facility]) - SSI- S Q6h -Hold Metformin, glimepiride - HgbA1c -Remain n.p.o.   DVT prophylaxis: SCDS Code Status:  FULL Family Communication: Spouse at bedside Disposition Plan: ~ 1- 2 days Consults called: Gen Surg by EDP Admission status:  Obs tele   Author: Bethena Roys, MD 08/08/2022 6:14 PM  For on call review [email or web address].

## 2022-08-08 NOTE — Assessment & Plan Note (Addendum)
-  Hypoglycemic -Continue with D5 half-normal saline -CBG every 6 hours  - SSI-  -Hold Metformin, glimepiride - HgbA1c -Remain n.p.o.

## 2022-08-08 NOTE — Assessment & Plan Note (Signed)
Initial hypotension blood pressure 86/62.  Now improved on IV fluids. -Hold Norvasc, metoprolol, ramipril

## 2022-08-08 NOTE — Progress Notes (Signed)
patient has stated that he has not urinated since he go to the hospital. he has had two liter boluses. we are currently doing a bladder scan and have not gotten over 156. MD Emokpae notified.

## 2022-08-08 NOTE — ED Notes (Signed)
Urine sample requested. Pt unable to pee at this time

## 2022-08-08 NOTE — ED Provider Notes (Addendum)
[facility]    CSN: [id number] Arrival date & time: 08/08/22  1018      History   Chief Complaint No chief complaint on file.   HPI Jonathan Shepard is a 72 y.o. male.   The history is provided by the patient.   The patient presents for complaints of nausea vomiting, diarrhea, and fatigue.  Patient states that symptoms started over the past 2 to 3 days.  He states that "I just do not feel like doing anything".  He states that he did vomited 2 times initially when his symptoms started, but has not vomited since.  He states he has been able to eat this morning, and is kept it down.  He states that his abdomen feels "tight" he also denies decreased urinary output.  Patient states he has also been having abdominal pain.  Patient denies any obvious known sick contacts.  He reports he is not sure if he received his influenza vaccine.  He states that he is diabetic, he states he did check his blood sugar 1 day ago, but does not recall what it was.  He denies any hypo or hyperglycemic episodes.  Patient also with history of GERD, CAD, hypertension, and hyperlipidemia. Past Medical History:  Diagnosis Date   CAD (coronary artery disease)    Diabetes ([place])    Dyspnea    GERD (gastroesophageal reflux disease)    Hyperlipidemia    Hypertension    Joint pain    hand, related to work   Obesity     Patient Active Problem List   Diagnosis Date Noted   Encounter for screening colonoscopy 07/21/2018   Transient Bifascicular block: RBBB+LAFB 04/23/2016   S/P CABG x 4 04/22/2016   PAD (peripheral artery disease) ([place]) 01/31/2013   CAD (coronary artery disease) 12/07/2012   S/P angioplasty with stent-'02, '08. Low risk Myoview 2011 12/07/2012   Non-insulin dependent type 2 diabetes mellitus ([place]) 12/07/2012   Dyslipidemia 12/07/2012   Family history of coronary artery disease 12/07/2012   Chest pain with moderate risk for cardiac etiology 12/07/2012    Past Surgical History:   Procedure Laterality Date   CARDIAC CATHETERIZATION N/A 04/14/2016   Procedure: Left Heart Cath and Coronary Angiography;  Surgeon: Peter M Martinique, MD;  Location: [facility];  Service: Cardiovascular;  Laterality: N/A;   COLONOSCOPY WITH PROPOFOL N/A 01/09/2019   Procedure: COLONOSCOPY WITH PROPOFOL;  Surgeon: Daneil Dolin, MD;  [facility];  Service: Endoscopy;  Laterality: N/A;  8:30am   CORONARY ARTERY BYPASS GRAFT N/A 04/22/2016   Procedure: CORONARY ARTERY BYPASS GRAFTING (CABG)x4 with endoscopic harvesting of right saphenous vein SVG to diagonal SVG to PDA Left Radial artery to OM 2 LIMA to LAD;  Surgeon: Melrose Nakayama, MD;  Location: [facility];  Service: Open Heart Surgery;  Laterality: N/A;   RADIAL ARTERY HARVEST Left 04/22/2016   Procedure: LEFT RADIAL ARTERY HARVEST;  Surgeon: Melrose Nakayama, MD;  Location: [facility];  Service: Open Heart Surgery;  Laterality: Left;   TEE WITHOUT CARDIOVERSION N/A 04/22/2016   Procedure: TRANSESOPHAGEAL ECHOCARDIOGRAM (TEE);  Surgeon: Melrose Nakayama, MD;  Location: [facility];  Service: Open Heart Surgery;  Laterality: N/A;       Home Medications    Prior to Admission medications   Medication Sig Start Date End Date Taking? Authorizing Provider  amLODipine (NORVASC) 10 MG tablet Take 1 tablet (10 mg total) by mouth daily. 08/03/22 07/29/23  Croitoru, Dani Gobble, MD  aspirin EC 81 MG tablet Take 81 mg by mouth every morning.     [provider]  clopidogrel (PLAVIX) 75 MG tablet Take 1 tablet (75 mg total) by mouth daily. 05/10/19   Croitoru, Mihai, MD  Co-Enzyme Q-10 100 MG CAPS Take 100 mg by mouth daily.     [provider]  diclofenac (CATAFLAM) 50 MG tablet Take 50 mg by mouth 2 (two) times daily. 07/09/22   [provider]  diclofenac (VOLTAREN) 75 MG EC tablet Take 75 mg by mouth 2 (two) times daily.    [provider]  fenofibrate 54 MG tablet TAKE 1 TABLET EVERY DAY Patient taking  differently: Take 54 mg by mouth daily. 05/10/18   Erlene Quan, PA-C  glimepiride (AMARYL) 2 MG tablet Take 2 mg by mouth 2 (two) times daily. 06/27/17   [provider]  HYDROcodone-acetaminophen (NORCO) 10-325 MG tablet Take 1 tablet by mouth every 6 (six) hours as needed for moderate pain.  Patient not taking: Reported on 08/05/2022 02/22/18   [provider]  metFORMIN (GLUCOPHAGE) 1000 MG tablet Take 1,000 mg by mouth 2 (two) times daily with a meal.    [provider]  metoprolol tartrate (LOPRESSOR) 25 MG tablet Take 1 tablet (25 mg total) by mouth 2 (two) times daily. 07/27/18   Croitoru, Mihai, MD  Multiple Vitamin (MULTIVITAMIN) tablet Take 1 tablet by mouth daily.    [provider]  Omega-3 Fatty Acids (FISH OIL) 1200 MG CAPS Take 1,200 mg by mouth 3 (three) times daily. Take 2400 mg by mouth in the morning and 1200 mg in the evening    [provider]  Oxycodone HCl 10 MG TABS Take 10 mg by mouth daily as needed (breakthrough pain).  Patient not taking: Reported on 08/05/2022 07/08/18   [provider]  pantoprazole (PROTONIX) 40 MG tablet TAKE 1 TABLET EVERY DAY Patient taking differently: Take 40 mg by mouth daily. 05/10/18   Erlene Quan, PA-C  pravastatin (PRAVACHOL) 80 MG tablet TAKE 1 TABLET EVERY DAY 06/09/22   Croitoru, Mihai, MD  ramipril (ALTACE) 10 MG capsule Take 1 capsule (10 mg total) by mouth daily. 07/30/22   Croitoru, Mihai, MD    Family History Family History  Problem Relation Age of Onset   Coronary artery disease Father 27       CABG   Cancer Father    Coronary artery disease Brother 5       CABG   Colon cancer Neg Hx    Colon polyps Neg Hx     Social History Social History   Tobacco Use   Smoking status: Never   Smokeless tobacco: Never  Substance Use Topics   Alcohol use: No   Drug use: No     Allergies   Patient has no known allergies.   Review of Systems Review of Systems Per  HPI  Physical Exam Triage Vital Signs ED Triage Vitals [08/08/22 1023]  Enc Vitals Group     BP 100/62     Pulse Rate 81     Resp 18     Temp 97.9 F (36.6 C)     Temp Source Oral     SpO2 97 %     Weight      Height      Head Circumference      Peak Flow      Pain Score 7     Pain Loc  Pain Edu?      Excl. in [place]?    No data found.  Updated Vital Signs BP 100/62 (BP Location: Right Arm)   Pulse 81   Temp 97.9 F (36.6 C) (Oral)   Resp 18   SpO2 97%   Visual Acuity Right Eye Distance:   Left Eye Distance:   Bilateral Distance:    Right Eye Near:   Left Eye Near:    Bilateral Near:     Physical Exam Vitals and nursing note reviewed.  Constitutional:      General: He is not in acute distress.    Appearance: Normal appearance. He is ill-appearing.  HENT:     Head: Normocephalic.     Right Ear: Tympanic membrane, ear canal and external ear normal.     Left Ear: Tympanic membrane, ear canal and external ear normal.     Nose: Nose normal.     Mouth/Throat:     Mouth: Mucous membranes are moist.  Eyes:     Extraocular Movements: Extraocular movements intact.     Conjunctiva/sclera: Conjunctivae normal.     Pupils: Pupils are equal, round, and reactive to light.  Cardiovascular:     Rate and Rhythm: Normal rate and regular rhythm.     Pulses: Normal pulses.     Heart sounds: Normal heart sounds.  Pulmonary:     Effort: Pulmonary effort is normal. No respiratory distress.     Breath sounds: Normal breath sounds. No stridor. No wheezing, rhonchi or rales.  Abdominal:     General: Bowel sounds are normal. There is distension.     Palpations: Abdomen is soft.     Tenderness: There is abdominal tenderness.  Musculoskeletal:     Cervical back: Normal range of motion.  Skin:    General: Skin is warm and dry.  Neurological:     General: No focal deficit present.     Mental Status: He is alert and oriented to person, place, and time.  Psychiatric:         Mood and Affect: Mood normal.        Behavior: Behavior normal.      UC Treatments / Results  Labs (all labs ordered are listed, but only abnormal results are displayed) Labs Reviewed  POCT INFLUENZA A/B  POCT URINALYSIS DIP (MANUAL ENTRY)    EKG   Radiology DG Abd 2 Views  Result Date: 08/08/2022 CLINICAL DATA:  Abdominal pain for 5 days with diarrhea. EXAM: ABDOMEN - 2 VIEW COMPARISON:  None Available. FINDINGS: Lung bases are normal. No free air, portal venous gas, or pneumatosis. Dilated loops of small bowel are identified primarily in the left side of the abdomen. There is a paucity of colonic gas. Probable right renal stones. No other significant abnormalities are identified. IMPRESSION: Small bowel obstruction. Recommend CT imaging for better evaluation. Right renal stone suspected. These results will be called to the ordering clinician or representative by the Radiologist Assistant, and communication documented in the PACS or [employer]. Electronically Signed   By: Dorise Bullion III M.D.   On: 08/08/2022 10:53    Received call from [facility] regarding radiology results.  Procedures Procedures (including critical care time)  Medications Ordered in UC Medications - No data to display  Initial Impression / Assessment and Plan / UC Course  I have reviewed the triage vital signs and the nursing notes.  Pertinent labs & imaging results that were available during my care of the patient were reviewed by  me and considered in my medical decision making (see chart for details).  The patient is ill-appearing, he is in no acute distress however, blood pressure is soft, vital signs are otherwise stable.  X-ray shows a small bowel obstruction and a possible right kidney stone.  Per x-ray result, CT imaging is recommended for better evaluation.  Patient's influenza test is negative.  Patient's vital signs are stable at this time, patient able to travel via private vehicle as his  spouse is present.  Patient verbalizes understanding and is in agreement with this plan of care.  All questions were answered.  Patient discharged to the C department.   Final Clinical Impressions(s) / UC Diagnoses   Final diagnoses:  Small bowel obstruction ([facility])  Diarrhea, unspecified type  Nausea and vomiting, unspecified vomiting type  Right renal stone     Discharge Instructions      Go to the emergency department for further evaluation.     ED Prescriptions   None    PDMP not reviewed this encounter.   Tish Men, NP 08/08/22 1103    Hendryx Ricke-Warren, Alda Lea, NP 08/08/22 1111

## 2022-08-08 NOTE — Assessment & Plan Note (Signed)
Stable. -Resume aspirin, Plavix, statins

## 2022-08-08 NOTE — Assessment & Plan Note (Addendum)
Resume Aspirin, statins, Plavix.

## 2022-08-08 NOTE — Assessment & Plan Note (Addendum)
POA : Abdominal pain, nausea vomiting>>> improved -Reporting a bowel movement and gas  -Sepsis was ruled out - CT-suggest enteritis/ileus or partial small bowel obstruction.  Lactic acid 1.8 > 2.1 -Trend lactic acid -On clear liquid diet, advancing as tolerated  - Per Dr. Robyne Peers, no need for NGT at this time . - Cont. IVF -IV Dilaudid 0.5 mg every 4 hourly as needed -Zofran as needed

## 2022-08-08 NOTE — ED Triage Notes (Signed)
Nausea and vomiting on Wednesday, diarrhea since Wednesday.  States nausea is better today.

## 2022-08-09 ENCOUNTER — Observation Stay (HOSPITAL_COMMUNITY): Payer: Medicare HMO

## 2022-08-09 ENCOUNTER — Inpatient Hospital Stay (HOSPITAL_COMMUNITY): Payer: Medicare HMO

## 2022-08-09 DIAGNOSIS — Z7902 Long term (current) use of antithrombotics/antiplatelets: Secondary | ICD-10-CM | POA: Diagnosis not present

## 2022-08-09 DIAGNOSIS — E11649 Type 2 diabetes mellitus with hypoglycemia without coma: Secondary | ICD-10-CM | POA: Diagnosis present

## 2022-08-09 DIAGNOSIS — E1151 Type 2 diabetes mellitus with diabetic peripheral angiopathy without gangrene: Secondary | ICD-10-CM | POA: Diagnosis present

## 2022-08-09 DIAGNOSIS — Z7982 Long term (current) use of aspirin: Secondary | ICD-10-CM | POA: Diagnosis not present

## 2022-08-09 DIAGNOSIS — I2510 Atherosclerotic heart disease of native coronary artery without angina pectoris: Secondary | ICD-10-CM | POA: Diagnosis present

## 2022-08-09 DIAGNOSIS — E785 Hyperlipidemia, unspecified: Secondary | ICD-10-CM | POA: Diagnosis present

## 2022-08-09 DIAGNOSIS — Z7984 Long term (current) use of oral hypoglycemic drugs: Secondary | ICD-10-CM | POA: Diagnosis not present

## 2022-08-09 DIAGNOSIS — R1084 Generalized abdominal pain: Secondary | ICD-10-CM | POA: Diagnosis present

## 2022-08-09 DIAGNOSIS — K56609 Unspecified intestinal obstruction, unspecified as to partial versus complete obstruction: Secondary | ICD-10-CM | POA: Diagnosis not present

## 2022-08-09 DIAGNOSIS — K56600 Partial intestinal obstruction, unspecified as to cause: Secondary | ICD-10-CM | POA: Diagnosis present

## 2022-08-09 DIAGNOSIS — E860 Dehydration: Secondary | ICD-10-CM | POA: Diagnosis present

## 2022-08-09 DIAGNOSIS — N179 Acute kidney failure, unspecified: Secondary | ICD-10-CM | POA: Diagnosis present

## 2022-08-09 DIAGNOSIS — Z951 Presence of aortocoronary bypass graft: Secondary | ICD-10-CM | POA: Diagnosis not present

## 2022-08-09 DIAGNOSIS — Z8249 Family history of ischemic heart disease and other diseases of the circulatory system: Secondary | ICD-10-CM | POA: Diagnosis not present

## 2022-08-09 DIAGNOSIS — K529 Noninfective gastroenteritis and colitis, unspecified: Secondary | ICD-10-CM | POA: Diagnosis present

## 2022-08-09 DIAGNOSIS — I10 Essential (primary) hypertension: Secondary | ICD-10-CM | POA: Diagnosis present

## 2022-08-09 DIAGNOSIS — Z79899 Other long term (current) drug therapy: Secondary | ICD-10-CM | POA: Diagnosis not present

## 2022-08-09 DIAGNOSIS — K219 Gastro-esophageal reflux disease without esophagitis: Secondary | ICD-10-CM | POA: Diagnosis present

## 2022-08-09 DIAGNOSIS — I959 Hypotension, unspecified: Secondary | ICD-10-CM | POA: Diagnosis present

## 2022-08-09 LAB — BASIC METABOLIC PANEL
Anion gap: 12 (ref 5–15)
BUN: 78 mg/dL — ABNORMAL HIGH (ref 8–23)
CO2: 17 mmol/L — ABNORMAL LOW (ref 22–32)
Calcium: 8.2 mg/dL — ABNORMAL LOW (ref 8.9–10.3)
Chloride: 106 mmol/L (ref 98–111)
Creatinine, Ser: 2.74 mg/dL — ABNORMAL HIGH (ref 0.61–1.24)
GFR, Estimated: 24 mL/min — ABNORMAL LOW (ref >60)
Glucose, Bld: 62 mg/dL — ABNORMAL LOW (ref 70–99)
Potassium: 3.9 mmol/L (ref 3.5–5.1)
Sodium: 135 mmol/L (ref 135–145)

## 2022-08-09 LAB — GLUCOSE, CAPILLARY
Glucose-Capillary: 120 mg/dL — ABNORMAL HIGH (ref 70–99)
Glucose-Capillary: 145 mg/dL — ABNORMAL HIGH (ref 70–99)
Glucose-Capillary: 148 mg/dL — ABNORMAL HIGH (ref 70–99)
Glucose-Capillary: 200 mg/dL — ABNORMAL HIGH (ref 70–99)
Glucose-Capillary: 216 mg/dL — ABNORMAL HIGH (ref 70–99)
Glucose-Capillary: 47 mg/dL — ABNORMAL LOW (ref 70–99)
Glucose-Capillary: 57 mg/dL — ABNORMAL LOW (ref 70–99)

## 2022-08-09 LAB — C DIFFICILE QUICK SCREEN W PCR REFLEX
C Diff antigen: NEGATIVE
C Diff interpretation: NOT DETECTED
C Diff toxin: NEGATIVE

## 2022-08-09 LAB — CBC
HCT: 41 % (ref 39.0–52.0)
Hemoglobin: 13.5 g/dL (ref 13.0–17.0)
MCH: 30.3 pg (ref 26.0–34.0)
MCHC: 32.9 g/dL (ref 30.0–36.0)
MCV: 91.9 fL (ref 80.0–100.0)
Platelets: 151 10*3/uL (ref 150–400)
RBC: 4.46 MIL/uL (ref 4.22–5.81)
RDW: 13.5 % (ref 11.5–15.5)
WBC: 5 10*3/uL (ref 4.0–10.5)
nRBC: 0 % (ref 0.0–0.2)

## 2022-08-09 LAB — HIV ANTIBODY (ROUTINE TESTING W REFLEX): HIV Screen 4th Generation wRfx: NONREACTIVE

## 2022-08-09 LAB — LACTIC ACID, PLASMA: Lactic Acid, Venous: 0.8 mmol/L (ref 0.5–1.9)

## 2022-08-09 MED ORDER — DEXTROSE-NACL 5-0.45 % IV SOLN: INTRAVENOUS | Status: DC

## 2022-08-09 MED ORDER — BISACODYL 10 MG RE SUPP
10.0000 mg | Freq: Every day | RECTAL | Status: DC
  Filled 2022-08-09: qty 1

## 2022-08-09 MED ORDER — DEXTROSE 5 % IV SOLN: INTRAVENOUS | Status: DC

## 2022-08-09 MED ORDER — SENNOSIDES-DOCUSATE SODIUM 8.6-50 MG PO TABS
1.0000 | ORAL_TABLET | Freq: Two times a day (BID) | ORAL | Status: DC
  Administered 2022-08-09 – 2022-08-10 (×2): 1 via ORAL
  Filled 2022-08-09 (×3): qty 1

## 2022-08-09 MED ORDER — DEXTROSE 50 % IV SOLN
INTRAVENOUS | Status: AC
  Filled 2022-08-09: qty 50

## 2022-08-09 MED ORDER — POLYETHYLENE GLYCOL 3350 17 G PO PACK: 17.0000 g | PACK | Freq: Every day | ORAL | Status: DC

## 2022-08-09 MED ORDER — DEXTROSE 50 % IV SOLN
25.0000 g | INTRAVENOUS | Status: AC
  Administered 2022-08-09: 25 g via INTRAVENOUS
  Filled 2022-08-09: qty 50

## 2022-08-09 MED ORDER — DEXTROSE 50 % IV SOLN
50.0000 mL | Freq: Once | INTRAVENOUS | Status: AC
  Administered 2022-08-09: 50 mL via INTRAVENOUS

## 2022-08-09 MED ORDER — DIATRIZOATE MEGLUMINE & SODIUM 66-10 % PO SOLN
90.0000 mL | Freq: Once | ORAL | Status: DC
  Filled 2022-08-09: qty 90

## 2022-08-09 MED ORDER — SENNOSIDES-DOCUSATE SODIUM 8.6-50 MG PO TABS: 1.0000 | ORAL_TABLET | Freq: Two times a day (BID) | ORAL | Status: DC

## 2022-08-09 NOTE — Consult Note (Signed)
[place]  Reason for Consult: Enteritis/ileus versus bowel obstruction Referring Physician: Kem Parkinson, PA-C  Chief Complaint   Abdominal Pain     HPI: Jonathan Shepard is a 72 y.o. male who presents with a 3-day history of nausea, vomiting, diarrhea, and abdominal cramping.  He states the last time he had something like this, he was evaluated by his primary care doctor, who was concerned for an obstruction.  At that time he presented to the emergency department and was worked up, but there was no abnormality noted on any of his imaging studies.  After this episode started, he presented to urgent care yesterday for evaluation.  He underwent a KUB, which was demonstrating for possible bowel obstruction, and recommended CT imaging.  He presented to the emergency department for evaluation.  In the ED, he underwent a CT abdomen and pelvis which demonstrated multiple mildly dilated loops of small bowel in the mid abdomen without discrete transition point, findings could reflect a nonspecific enteritis/ileus or partial small bowel obstruction.  On his blood work, he had no leukocytosis, but was noted to have a significant AKI, which prompted admission to the hospital.  This morning, he confirms that he has had multiple episodes of diarrhea since being in the hospital.  He denies significant abdominal pain or cramping.  He also denies nausea and vomiting.  He states last time that he passed flatus was 2 days ago.  His past medical history significant for diabetes, GERD, hypertension, hyperlipidemia, and coronary artery disease, status post CABG in 2017 on Plavix and aspirin.  He last took his Plavix on Tuesday.  He denies any history of abdominal surgeries.  Past Medical History:  Diagnosis Date   CAD (coronary artery disease)    Diabetes ([place])    Dyspnea    GERD (gastroesophageal reflux disease)    Hyperlipidemia    Hypertension    Joint pain    hand, related to  work   Obesity     Past Surgical History:  Procedure Laterality Date   CARDIAC CATHETERIZATION N/A 04/14/2016   Procedure: Left Heart Cath and Coronary Angiography;  Surgeon: Peter M Martinique, MD;  Location: [facility];  Service: Cardiovascular;  Laterality: N/A;   COLONOSCOPY WITH PROPOFOL N/A 01/09/2019   Procedure: COLONOSCOPY WITH PROPOFOL;  Surgeon: Daneil Dolin, MD;  [facility];  Service: Endoscopy;  Laterality: N/A;  8:30am   CORONARY ARTERY BYPASS GRAFT N/A 04/22/2016   Procedure: CORONARY ARTERY BYPASS GRAFTING (CABG)x4 with endoscopic harvesting of right saphenous vein SVG to diagonal SVG to PDA Left Radial artery to OM 2 LIMA to LAD;  Surgeon: Melrose Nakayama, MD;  Location: [facility];  Service: Open Heart Surgery;  Laterality: N/A;   RADIAL ARTERY HARVEST Left 04/22/2016   Procedure: LEFT RADIAL ARTERY HARVEST;  Surgeon: Melrose Nakayama, MD;  Location: [place] [facility];  Service: Open Heart Surgery;  Laterality: Left;   TEE WITHOUT CARDIOVERSION N/A 04/22/2016   Procedure: TRANSESOPHAGEAL ECHOCARDIOGRAM (TEE);  Surgeon: Melrose Nakayama, MD;  Location: [place];  Service: Open Heart Surgery;  Laterality: N/A;    Family History  Problem Relation Age of Onset   Coronary artery disease Father 45       CABG   Cancer Father    Coronary artery disease Brother 33       CABG   Colon cancer Neg Hx    Colon polyps Neg Hx     Social History  Tobacco Use   Smoking status: Never   Smokeless tobacco: Never  Substance Use Topics   Alcohol use: No   Drug use: No    Medications: I have reviewed the patient's current medications. Prior to Admission:  Medications Prior to Admission  Medication Sig Dispense Refill Last Dose   amLODipine (NORVASC) 10 MG tablet Take 1 tablet (10 mg total) by mouth daily. 180 tablet 1    aspirin EC 81 MG tablet Take 81 mg by mouth every morning.       clopidogrel (PLAVIX) 75 MG tablet Take 1 tablet (75 mg total) by mouth daily. 90  tablet 0    Co-Enzyme Q-10 100 MG CAPS Take 100 mg by mouth daily.       diclofenac (CATAFLAM) 50 MG tablet Take 50 mg by mouth 2 (two) times daily.      diclofenac (VOLTAREN) 75 MG EC tablet Take 75 mg by mouth 2 (two) times daily.      fenofibrate 54 MG tablet TAKE 1 TABLET EVERY DAY (Patient taking differently: Take 54 mg by mouth daily.) 90 tablet 1    glimepiride (AMARYL) 2 MG tablet Take 2 mg by mouth 2 (two) times daily.      HYDROcodone-acetaminophen (NORCO) 10-325 MG tablet Take 1 tablet by mouth every 6 (six) hours as needed for moderate pain.  (Patient not taking: Reported on 08/05/2022)  0    metFORMIN (GLUCOPHAGE) 1000 MG tablet Take 1,000 mg by mouth 2 (two) times daily with a meal.      metoprolol tartrate (LOPRESSOR) 25 MG tablet Take 1 tablet (25 mg total) by mouth 2 (two) times daily. 180 tablet 0    Multiple Vitamin (MULTIVITAMIN) tablet Take 1 tablet by mouth daily.      Omega-3 Fatty Acids (FISH OIL) 1200 MG CAPS Take 1,200 mg by mouth 3 (three) times daily. Take 2400 mg by mouth in the morning and 1200 mg in the evening      Oxycodone HCl 10 MG TABS Take 10 mg by mouth daily as needed (breakthrough pain).  (Patient not taking: Reported on 08/05/2022)      pantoprazole (PROTONIX) 40 MG tablet TAKE 1 TABLET EVERY DAY (Patient taking differently: Take 40 mg by mouth daily.) 90 tablet 1    pravastatin (PRAVACHOL) 80 MG tablet TAKE 1 TABLET EVERY DAY 90 tablet 3    ramipril (ALTACE) 10 MG capsule Take 1 capsule (10 mg total) by mouth daily. 90 capsule 0    Scheduled:  diatrizoate meglumine-sodium  90 mL Per NG tube Once   heparin  5,000 Units Subcutaneous Q8H   insulin aspart  0-9 Units Subcutaneous Q6H   Continuous:  dextrose 5 % and 0.45% NaCl 125 mL/hr at 08/09/22 [id number]   [id number] **OR** acetaminophen, HYDROmorphone (DILAUDID) injection, ondansetron **OR** ondansetron (ZOFRAN) IV  No Known Allergies   ROS:  Pertinent items are noted in HPI.  Blood pressure  134/71, pulse 85, temperature 97.8 F (36.6 C), resp. rate 20, height 6' (1.829 m), weight 93.5 kg, SpO2 98 %. Physical Exam Vitals reviewed.  Constitutional:      Appearance: He is well-developed.  HENT:     Head: Normocephalic and atraumatic.  Eyes:     Extraocular Movements: Extraocular movements intact.     Pupils: Pupils are equal, round, and reactive to light.  Cardiovascular:     Rate and Rhythm: Normal rate.  Pulmonary:     Effort: Pulmonary effort is normal.  Abdominal:  Comments: Abdomen soft, mild distention, no percussion tenderness, nontender to palpation; no rigidity, guarding, rebound tenderness  Skin:    General: Skin is warm and dry.  Neurological:     General: No focal deficit present.     Mental Status: He is alert and oriented to person, place, and time.  Psychiatric:        Mood and Affect: Mood normal.        Behavior: Behavior normal.     Results: Results for orders placed or performed during the hospital encounter of 08/08/22 (from the past 48 hour(s))  Lactic acid, plasma     Status: None   Collection Time: 08/08/22 12:13 PM  Result Value Ref Range   Lactic Acid, Venous 1.8 0.5 - 1.9 mmol/L    Comment: Performed at [facility], [address] [place], [place] [address]  Comprehensive metabolic panel     Status: Abnormal   Collection Time: 08/08/22 12:13 PM  Result Value Ref Range   Sodium 131 (L) 135 - 145 mmol/L   Potassium 4.7 3.5 - 5.1 mmol/L   Chloride 100 98 - 111 mmol/L   CO2 18 (L) 22 - 32 mmol/L   Glucose, Bld 139 (H) 70 - 99 mg/dL    Comment: Glucose reference range applies only to samples taken after fasting for at least 8 hours.   BUN 79 (H) 8 - 23 mg/dL   Creatinine, Ser 4.59 (H) 0.61 - 1.24 mg/dL   Calcium 9.2 8.9 - 10.3 mg/dL   Total Protein 8.1 6.5 - 8.1 g/dL   Albumin 4.2 3.5 - 5.0 g/dL   AST 34 15 - 41 U/L   ALT 37 0 - 44 U/L   Alkaline Phosphatase 58 38 - 126 U/L   Total Bilirubin 0.5 0.3 - 1.2 mg/dL   GFR, Estimated  13 (L) >60 mL/min    Comment: (NOTE) Calculated using the CKD-EPI Creatinine Equation (2021)    Anion gap 13 5 - 15    Comment: Performed at [facility], [address] [place], [place] [address]  CBC with Differential     Status: None   Collection Time: 08/08/22 12:13 PM  Result Value Ref Range   WBC 7.8 4.0 - 10.5 K/uL   RBC 5.37 4.22 - 5.81 MIL/uL   Hemoglobin 16.2 13.0 - 17.0 g/dL   HCT 49.6 39.0 - 52.0 %   MCV 92.4 80.0 - 100.0 fL   MCH 30.2 26.0 - 34.0 pg   MCHC 32.7 30.0 - 36.0 g/dL   RDW 13.7 11.5 - 15.5 %   Platelets 220 150 - 400 K/uL   nRBC 0.0 0.0 - 0.2 %   Neutrophils Relative % 66 %   Neutro Abs 5.1 1.7 - 7.7 K/uL   Lymphocytes Relative 22 %   Lymphs Abs 1.7 0.7 - 4.0 K/uL   Monocytes Relative 12 %   Monocytes Absolute 1.0 0.1 - 1.0 K/uL   Eosinophils Relative 0 %   Eosinophils Absolute 0.0 0.0 - 0.5 K/uL   Basophils Relative 0 %   Basophils Absolute 0.0 0.0 - 0.1 K/uL   Immature Granulocytes 0 %   Abs Immature Granulocytes 0.02 0.00 - 0.07 K/uL    Comment: Performed at [facility], [address] [place], [place] [address]  Protime-INR     Status: None   Collection Time: 08/08/22 12:13 PM  Result Value Ref Range   Prothrombin Time 12.7 11.4 - 15.2 seconds   INR 1.0 0.8 - 1.2  Comment: (NOTE) INR goal varies based on device and disease states. Performed at [facility], [address] [place], [place] [address]   APTT     Status: None   Collection Time: 08/08/22 12:13 PM  Result Value Ref Range   aPTT 28 24 - 36 seconds    Comment: Performed at [facility], [address] [place], [place] [address]  Blood Culture (routine x 2)     Status: None (Preliminary result)   Collection Time: 08/08/22 12:13 PM   Specimen: Right Antecubital; Blood  Result Value Ref Range   Specimen Description      RIGHT ANTECUBITAL BOTTLES DRAWN AEROBIC AND ANAEROBIC   Special Requests      Blood Culture results may not be optimal due to an excessive volume of blood  received in culture bottles   Culture      NO GROWTH < 12 HOURS Performed at [facility], [address], [place], [place] [address]    Report Status PENDING   Lipase, blood     Status: Abnormal   Collection Time: 08/08/22 12:13 PM  Result Value Ref Range   Lipase 67 (H) 11 - 51 U/L    Comment: Performed at [facility], [address], [place], [place] [address]  Blood Culture (routine x 2)     Status: None (Preliminary result)   Collection Time: 08/08/22 12:18 PM   Specimen: BLOOD LEFT FOREARM  Result Value Ref Range   Specimen Description      BLOOD LEFT FOREARM BOTTLES DRAWN AEROBIC AND ANAEROBIC   Special Requests Blood Culture adequate volume    Culture      NO GROWTH < 12 HOURS Performed at [facility], [address] [place], [place] [address]    Report Status PENDING   Lactic acid, plasma     Status: Abnormal   Collection Time: 08/08/22  2:30 PM  Result Value Ref Range   Lactic Acid, Venous 2.1 (HH) 0.5 - 1.9 mmol/L    Comment: CRITICAL RESULT CALLED TO, READ BACK BY AND VERIFIED WITH DANNER A. AT 1452 ON [id number] BY [place] Silos S Performed at [facility], [address] [place], [place] [address]   Glucose, capillary     Status: None   Collection Time: 08/08/22  5:06 PM  Result Value Ref Range   Glucose-Capillary 97 70 - 99 mg/dL    Comment: Glucose reference range applies only to samples taken after fasting for at least 8 hours.  Lactic acid, plasma     Status: None   Collection Time: 08/08/22  5:14 PM  Result Value Ref Range   Lactic Acid, Venous 1.8 0.5 - 1.9 mmol/L    Comment: Performed at [facility], [address], [place], [place] [address]  Urinalysis, Routine w reflex microscopic -Urine, Clean Catch     Status: Abnormal   Collection Time: 08/08/22  8:03 PM  Result Value Ref Range   Color, Urine YELLOW YELLOW   APPearance HAZY (A) CLEAR   Specific Gravity, Urine 1.020 1.005 - 1.030   pH 5.0 5.0 - 8.0   Glucose, UA NEGATIVE NEGATIVE mg/dL   Hgb urine  dipstick NEGATIVE NEGATIVE   Bilirubin Urine NEGATIVE NEGATIVE   Ketones, ur NEGATIVE NEGATIVE mg/dL   Protein, ur 30 (A) NEGATIVE mg/dL   Nitrite NEGATIVE NEGATIVE   Leukocytes,Ua NEGATIVE NEGATIVE   RBC / HPF 0-5 0 - 5 RBC/hpf   WBC, UA 0-5 0 - 5 WBC/hpf   Bacteria, UA NONE SEEN  NONE SEEN   Squamous Epithelial / HPF 0-5 0 - 5 /HPF   Mucus PRESENT    Hyaline Casts, UA PRESENT     Comment: Performed at [facility], [address] [place], [place] [address]  Glucose, capillary     Status: Abnormal   Collection Time: 08/09/22 12:24 AM  Result Value Ref Range   Glucose-Capillary 47 (L) 70 - 99 mg/dL    Comment: Glucose reference range applies only to samples taken after fasting for at least 8 hours.  Glucose, capillary     Status: Abnormal   Collection Time: 08/09/22 12:45 AM  Result Value Ref Range   Glucose-Capillary 120 (H) 70 - 99 mg/dL    Comment: Glucose reference range applies only to samples taken after fasting for at least 8 hours.  Basic metabolic panel     Status: Abnormal   Collection Time: 08/09/22  4:14 AM  Result Value Ref Range   Sodium 135 135 - 145 mmol/L   Potassium 3.9 3.5 - 5.1 mmol/L   Chloride 106 98 - 111 mmol/L   CO2 17 (L) 22 - 32 mmol/L   Glucose, Bld 62 (L) 70 - 99 mg/dL    Comment: Glucose reference range applies only to samples taken after fasting for at least 8 hours.   BUN 78 (H) 8 - 23 mg/dL   Creatinine, Ser 2.74 (H) 0.61 - 1.24 mg/dL   Calcium 8.2 (L) 8.9 - 10.3 mg/dL   GFR, Estimated 24 (L) >60 mL/min    Comment: (NOTE) Calculated using the CKD-EPI Creatinine Equation (2021)    Anion gap 12 5 - 15    Comment: Performed at [facility], [address] [place], [place] [address]  CBC     Status: None   Collection Time: 08/09/22  4:14 AM  Result Value Ref Range   WBC 5.0 4.0 - 10.5 K/uL   RBC 4.46 4.22 - 5.81 MIL/uL   Hemoglobin 13.5 13.0 - 17.0 g/dL   HCT 41.0 39.0 - 52.0 %   MCV 91.9 80.0 - 100.0 fL   MCH 30.3 26.0 - 34.0 pg   MCHC  32.9 30.0 - 36.0 g/dL   RDW 13.5 11.5 - 15.5 %   Platelets 151 150 - 400 K/uL   nRBC 0.0 0.0 - 0.2 %    Comment: Performed at [facility], [address], [place], [place] [address]  Glucose, capillary     Status: Abnormal   Collection Time: 08/09/22  6:16 AM  Result Value Ref Range   Glucose-Capillary 57 (L) 70 - 99 mg/dL    Comment: Glucose reference range applies only to samples taken after fasting for at least 8 hours.  Glucose, capillary     Status: Abnormal   Collection Time: 08/09/22  6:40 AM  Result Value Ref Range   Glucose-Capillary 200 (H) 70 - 99 mg/dL    Comment: Glucose reference range applies only to samples taken after fasting for at least 8 hours.  Lactic acid, plasma     Status: None   Collection Time: 08/09/22  8:02 AM  Result Value Ref Range   Lactic Acid, Venous 0.8 0.5 - 1.9 mmol/L    Comment: Performed at [facility], [address], [place], [place] [address]  Glucose, capillary     Status: Abnormal   Collection Time: 08/09/22 11:25 AM  Result Value Ref Range   Glucose-Capillary 145 (H) 70 - 99 mg/dL    Comment: Glucose reference range applies only to  samples taken after fasting for at least 8 hours.    DG Abd Portable 1V-Small Bowel Protocol-Position Verification  Result Date: 08/09/2022 CLINICAL DATA:  Encounter for imaging study to confirm nasogastric tube placement. Technologist notes state 8 hour delay image, no nasogastric tube in place. Contrast administered p.o. per MD. 60 cc Gastrografin administered. EXAM: PORTABLE ABDOMEN - 1 VIEW COMPARISON:  Abdominopelvic CT yesterday. FINDINGS: Contrast opacifies the stomach and duodenum. Gaseous small bowel distention in the left abdomen, small-bowel measures up to 5.1 cm. There is no enteric contrast in the colon. Intrarenal calculi on prior CT are not well delineated due to overlying bowel gas. No nasogastric tube is seen. IMPRESSION: 1. Contrast opacifies the stomach and duodenum. No contrast in the colon. 2.  Gaseous small bowel distention in the left abdomen, similar to yesterday's CT. Electronically Signed   By: Keith Rake M.D.   On: 08/09/2022 11:28   CT ABDOMEN PELVIS WO CONTRAST  Result Date: 08/08/2022 CLINICAL DATA:  Abdominal pain, acute, nonlocalized abd x 3 days, V/D EXAM: CT ABDOMEN AND PELVIS WITHOUT CONTRAST TECHNIQUE: Multidetector CT imaging of the abdomen and pelvis was performed following the standard protocol without IV contrast. RADIATION DOSE REDUCTION: This exam was performed according to the departmental dose-optimization program which includes automated exposure control, adjustment of the mA and/or kV according to patient size and/or use of iterative reconstruction technique. COMPARISON:  August 04, 2017 FINDINGS: Evaluation is limited due to lack of IV contrast. Lower chest: Similar rounded opacity at the LEFT lung base, most consistent with rounded atelectasis. RIGHT upper lobe pulmonary nodule measures 3 mm (series 4, image 1). Hepatobiliary: Hepatic steatosis.  Cholelithiasis. Pancreas: No peripancreatic fat stranding. Spleen: Unremarkable. Adrenals/Urinary Tract: Adrenal glands are unremarkable. Punctate nonobstructing bilateral nephrolithiasis, the largest of which measures 4 mm in the superior pole of the RIGHT kidney. No obstructing nephrolithiasis. Bladder is decompressed. Stomach/Bowel: There are multiple mildly dilated loops small bowel in the mid abdomen without discrete transition point. Bowel loops gently taper in the RIGHT lower quadrant (series 2, image 73). Representative loop of small bowel measures approximately 4 cm in diameter. Appendix is normal. Stomach is decompressed. Vascular/Lymphatic: Atherosclerotic calcifications of the nonaneurysmal abdominal aorta. There are prominent mesenteric lymph nodes without frank adenopathy by size criteria. Reproductive: Coarse calcifications of the prostate. Other: Small fat containing RIGHT inguinal hernia. No free air or free  fluid. Musculoskeletal: No acute or significant osseous findings. IMPRESSION: 1. There are multiple mildly dilated loops of small bowel in the mid abdomen without discrete transition point. Bowel loops gently taper in the RIGHT lower quadrant. Findings could reflect a nonspecific enteritis/ileus or partial small bowel obstruction. 2. Hepatic steatosis. 3. There is a 3 mm RIGHT upper lobe pulmonary nodule. Per Fleischner Society Guidelines, if patient is low risk for malignancy, no routine follow-up imaging is recommended. If patient is high risk for malignancy, a non-contrast Chest CT at 12 months is optional. If performed and the nodule is stable at 12 months, no further follow-up is recommended. These guidelines do not apply to immunocompromised patients and patients with cancer. Follow up in patients with significant comorbidities as clinically warranted. For lung cancer screening, adhere to Lung-RADS guidelines. Reference: Radiology. 2017; 284(1):228-43. Aortic Atherosclerosis (ICD10-I70.0). Electronically Signed   By: Valentino Saxon M.D.   On: 08/08/2022 14:01   DG Chest Port 1 View  Result Date: 08/08/2022 CLINICAL DATA:  Sepsis. EXAM: PORTABLE CHEST 1 VIEW COMPARISON:  08/04/2017 FINDINGS: The lungs are clear without focal pneumonia,  edema, pneumothorax or pleural effusion. Cardiopericardial silhouette is at upper limits of normal for size. Status post CABG. The visualized bony structures of the thorax are unremarkable. IMPRESSION: No active disease. Electronically Signed   By: Misty Stanley M.D.   On: 08/08/2022 13:05   DG Abd 2 Views  Result Date: 08/08/2022 CLINICAL DATA:  Abdominal pain for 5 days with diarrhea. EXAM: ABDOMEN - 2 VIEW COMPARISON:  None Available. FINDINGS: Lung bases are normal. No free air, portal venous gas, or pneumatosis. Dilated loops of small bowel are identified primarily in the left side of the abdomen. There is a paucity of colonic gas. Probable right renal stones. No  other significant abnormalities are identified. IMPRESSION: Small bowel obstruction. Recommend CT imaging for better evaluation. Right renal stone suspected. These results will be called to the ordering clinician or representative by the Radiologist Assistant, and communication documented in the PACS or [employer]. Electronically Signed   By: Dorise Bullion III M.D.   On: 08/08/2022 10:53     Assessment & Plan:  Jonathan Shepard is a 72 y.o. male who was admitted with AKI and enteritis/ileus versus partial small bowel obstruction.  Imaging and blood work evaluated by myself  -Patient's history is more consistent with potential enteritis based on diarrhea, nausea, and vomiting.  I evaluated the imaging myself, and agree that there is mild tapering of the dilated bowel to nondistended bowel. -AM KUB evaluated, and demonstrating similarly dilated loops of bowel.  Will repeat KUB tomorrow morning -Will trial clear liquid diet and see how the patient does -I advised the patient that if he begins to have significant nausea or vomiting, he may require NG tube placement for decompression.  I would hold off on NG tube placement at this time, as he did not have significant distention of his stomach on CT scan and I believe this picture is more of an enteritis with associated ileus -I will order a bowel regimen for the patient -Monitor bowel function -IV fluids per primary team -No acute surgical intervention at this time -Continue to hold Plavix in the event patient does need surgical intervention -Appreciate hospitalist recommendations  All questions were answered to the satisfaction of the patient and family.  -- Graciella Freer, DO [facility] [address] [place], [place] [address] [phone] (office)

## 2022-08-09 NOTE — Progress Notes (Signed)
Hypoglycemic Event  CBG: 47 mg/dL  Treatment: D50 25 mL (12.5 gm)  Symptoms: None  Follow-up CBG: Time:0045 CBG Result:120 mg/dL   Possible Reasons for Event: Other: Patient currently NPO, with history of Diabetes Type 2.   Comments/MD notified: Dr. Clearence Ped Notified, Fluids changed to Dextrose 5% at 50 ml/hr per MD order.   Roxanna Mew

## 2022-08-09 NOTE — Progress Notes (Signed)
  Transition of Care [facility]) Screening Note   Patient Details  Name: Jonathan Shepard Date of Birth: 1950/10/28   [facility] of [facility]) CM/SW Contact:    Ihor Gully, LCSW Phone Number: 08/09/2022, 11:52 AM    Transition of Care Department [facility]) has reviewed patient and no TOC needs have been identified at this time. We will continue to monitor patient advancement through interdisciplinary progression rounds. If new patient transition needs arise, please place a TOC consult.

## 2022-08-09 NOTE — Progress Notes (Signed)
Hypoglycemic Event  CBG: 57 mg/dL  Treatment: D50 50 mL (25 gm)  Symptoms: None  Follow-up CBG: Time:200 mg/dL CBG Result: 0640  Possible Reasons for Event: Other: Patient NPO and history of Diabetes.   Comments/MD notified: Dr. Irene Pap notified, D50 48m given whole amp per MD and Continued Dextrose 5% IV solution continuous.     JRoxanna Mew

## 2022-08-09 NOTE — Hospital Course (Signed)
Jonathan Shepard is a 72 y.o. male with medical history significant for CABG, HTN, DM.   -Patient presented to the ED with complaints of diffuse abdominal pain, nausea and vomiting.  Symptoms started 3 days ago on Wednesday 1/28.  Initially had at least 8-9 episodes each of both vomiting and diarrhea.  Vomiting resolved by the next day, but he has had persistent diarrhea.  He had 1 loose bowel movement today.  No fevers.  He has slightly reduced urinary output, otherwise no urinary symptoms. No recent antibiotics. Presented to urgent care, x-ray showed SBO, right kidney stone.  He was referred to the ED.   ED Course: Blood pressure down to 86/62 improved after IV fluids to 99/58.  Fattening elevated at 4.59.  Lactic acid 1.8 > 0.1.  WBC 7.8. CT abdomen and pelvis-multiple mildly dilated loops of small bowel in the mid abdomen without discrete transition point.  Nonspecific enteritis/ileus or partial small bowel obstruction. 2 L bolus given in ED.

## 2022-08-09 NOTE — Care Management Obs Status (Signed)
[place] NOTIFICATION   Patient Details  Name: Jonathan Shepard MRN: [id number] Date of Birth: June 12, 1950   Medicare Observation Status Notification Given:  Yes    Ihor Gully, LCSW 08/09/2022, 11:51 AM

## 2022-08-09 NOTE — Progress Notes (Signed)
Patient had one BM and passed gas this shift, ambulated in the hallway multiple times. Patient did not c/o nausea or abdomina pain.

## 2022-08-09 NOTE — Progress Notes (Signed)
Patient NPO this shift per orders, Patient had two hypoglycemic events this shift. No complaints of any symptoms patient has been sleeping this shift. Dr. Clearence Ped notified both times. Continued to monitor.

## 2022-08-09 NOTE — Progress Notes (Signed)
PROGRESS NOTE    Patient: Jonathan Shepard                            PCP: Redmond School, MD                    DOB: 17-Dec-1950            DOA: 08/08/2022 [id number]             DOS: 08/09/2022, 9:43 AM   LOS: 0 days   Date of Service: The patient was seen and examined on 08/09/2022  Subjective:   The patient was seen and examined this morning. Hemodynamically stable. Reporting with diarrhea... Improved abdominal cramping  Brief Narrative:    Jonathan Shepard is a 72 y.o. male with medical history significant for CABG, HTN, DM.   -Patient presented to the ED with complaints of diffuse abdominal pain, nausea and vomiting.  Symptoms started 3 days ago on Wednesday 1/28.  Initially had at least 8-9 episodes each of both vomiting and diarrhea.  Vomiting resolved by the next day, but he has had persistent diarrhea.  He had 1 loose bowel movement today.  No fevers.  He has slightly reduced urinary output, otherwise no urinary symptoms. No recent antibiotics. Presented to urgent care, x-ray showed SBO, right kidney stone.  He was referred to the ED.   ED Course: Blood pressure down to 86/62 improved after IV fluids to 99/58.  Fattening elevated at 4.59.  Lactic acid 1.8 > 0.1.  WBC 7.8. CT abdomen and pelvis-multiple mildly dilated loops of small bowel in the mid abdomen without discrete transition point.  Nonspecific enteritis/ileus or partial small bowel obstruction. 2 L bolus given in ED.    Assessment & Plan:   Principal Problem:   AKI (acute kidney injury) ([place]) Active Problems:   Gastroenteritis   Non-insulin dependent type 2 diabetes mellitus ([place])   PAD (peripheral artery disease) ([place])   S/P CABG x 4   HTN (hypertension)     Assessment and Plan: * AKI (acute kidney injury) ([place]) Creatinine 4.59, last checked 5 years ago- 2019, creatinine was 0.99.   -Likely due to diarrhea, GI loss could dehydration -Continue IV fluid r -Monitoring BUN/creatinine closely -We  will holding ACE inhibitor-lisinopril - CT abdomen showed-punctate nonobstructing bilateral left for lithiasis, largest in the superior pole of right kidney measuring 4 mm.  No obstructing nephrolithiasis.   - Give 3rd L bolus, as still no urine output, then continue N/s 100cc/hr x 1 day   Gastroenteritis Presented with abdominal pain, vomiting, nausea, diarrhea.  CT-suggest enteritis/ileus or partial small bowel obstruction.  Last bowel movement was this morning.  No recent antibiotics.  Rules out for sepsis.  Lactic acid 1.8 > 2.1 -Trend lactic acid -Bowel rest, n.p.o.  - Per Dr. Okey Dupre, no need for NGT at this time . - Cont. IVF -IV Dilaudid 0.5 mg every 4 hourly as needed -Zofran as needed  HTN (hypertension) - Mild Hypotension -Holding Norvasc, metoprolol, ramipril  S/P CABG x 4 Stable. -N.p.o. for now, hold aspirin, Plavix, statins  PAD (peripheral artery disease) ([facility]) Hold aspirin, statins, Plavix.  Non-insulin dependent type 2 diabetes mellitus ([place]) -Hypoglycemic -Continue with D5 half-normal saline -CBG every 6 hours  - SSI-  -Hold Metformin, glimepiride - HgbA1c -Remain n.p.o.  ---------------------------------------------------------------------------------------------------------------------  DVT prophylaxis:  heparin injection 5,000 Units Start: 08/08/22 1700   Code Status:   Code Status:  Full Code  Family Communication: No family member present at bedside- attempt will be made to update daily The above findings and plan of care has been discussed with patient (and family)  in detail,  they expressed understanding and agreement of above. -Advance care planning has been discussed.   Admission status:   Status is: Observation The patient remains OBS appropriate and will d/c before 2 midnights.   Disposition: From  - home             Planning for discharge in 1-2 days: to   Procedures:   No admission procedures for hospital  encounter.   Antimicrobials:  Anti-infectives (From admission, onward)    None        Medication:   diatrizoate meglumine-sodium  90 mL Per NG tube Once   heparin  5,000 Units Subcutaneous Q8H   insulin aspart  0-9 Units Subcutaneous Q6H    acetaminophen **OR** acetaminophen, HYDROmorphone (DILAUDID) injection, ondansetron **OR** ondansetron (ZOFRAN) IV   Objective:   Vitals:   08/08/22 1701 08/08/22 2118 08/09/22 0021 08/09/22 0619  BP: 116/67 (!) 106/59 115/64 134/71  Pulse: 69 77 74 85  Resp: '19 20 16 20  '$ Temp: (!) 97.4 F (36.3 C) 97.9 F (36.6 C) 97.7 F (36.5 C) 97.8 F (36.6 C)  TempSrc:      SpO2: 100% 97% 97% 98%  Weight: 93.5 kg     Height: 6' (1.829 m)       Intake/Output Summary (Last 24 hours) at 08/09/2022 [id number] Last data filed at 08/09/2022 0500 Gross per 24 hour  Intake 1779.27 ml  Output 725 ml  Net 1054.27 ml   Filed Weights   08/08/22 1115 08/08/22 1701  Weight: 100.7 kg 93.5 kg     Physical examination:   Constitution:  Alert, cooperative, no distress,  Appears calm and comfortable  Psychiatric:   Normal and stable mood and affect, cognition intact,   HEENT:        Normocephalic, PERRL, otherwise with in Normal limits  Chest:         Chest symmetric Cardio vascular:  S1/S2, RRR, No murmure, No Rubs or Gallops  pulmonary: Clear to auscultation bilaterally, respirations unlabored, negative wheezes / crackles Abdomen: Soft, mild diffuse tenderness, non-distended, bowel sounds,no masses, no organomegaly Muscular skeletal: Limited exam - in bed, able to move all 4 extremities,   Neuro: CNII-XII intact. , normal motor and sensation, reflexes intact  Extremities: No pitting edema lower extremities, +2 pulses  Skin: Dry, warm to touch, negative for any Rashes, No open wounds Wounds: per nursing  documentation   ------------------------------------------------------------------------------------------------------------------------------------------    LABs:     Latest Ref Rng & Units 08/09/2022    4:14 AM 08/08/2022   12:13 PM 08/06/2017    8:17 AM  CBC  WBC 4.0 - 10.5 K/uL 5.0  7.8  6.3   Hemoglobin 13.0 - 17.0 g/dL 13.5  16.2  13.8   Hematocrit 39.0 - 52.0 % 41.0  49.6  42.4   Platelets 150 - 400 K/uL 151  220  181       Latest Ref Rng & Units 08/09/2022    4:14 AM 08/08/2022   12:13 PM 08/05/2017    8:02 AM  CMP  Glucose 70 - 99 mg/dL 62  139  199   BUN 8 - 23 mg/dL 78  79  16   Creatinine 0.61 - 1.24 mg/dL 2.74  4.59  0.99   Sodium 135 - 145 mmol/L 135  131  137   Potassium 3.5 - 5.1 mmol/L 3.9  4.7  4.1   Chloride 98 - 111 mmol/L 106  100  105   CO2 22 - 32 mmol/L '17  18  22   '$ Calcium 8.9 - 10.3 mg/dL 8.2  9.2  9.5   Total Protein 6.5 - 8.1 g/dL  8.1    Total Bilirubin 0.3 - 1.2 mg/dL  0.5    Alkaline Phos 38 - 126 U/L  58    AST 15 - 41 U/L  34    ALT 0 - 44 U/L  37         Micro Results Recent Results (from the past 240 hour(s))  Blood Culture (routine x 2)     Status: None (Preliminary result)   Collection Time: 08/08/22 12:13 PM   Specimen: Right Antecubital; Blood  Result Value Ref Range Status   Specimen Description   Final    RIGHT ANTECUBITAL BOTTLES DRAWN AEROBIC AND ANAEROBIC   Special Requests   Final    Blood Culture results may not be optimal due to an excessive volume of blood received in culture bottles   Culture   Final    NO GROWTH < 12 HOURS Performed at [facility], [address] [place], [place] [address]    Report Status PENDING  Incomplete  Blood Culture (routine x 2)     Status: None (Preliminary result)   Collection Time: 08/08/22 12:18 PM   Specimen: BLOOD LEFT FOREARM  Result Value Ref Range Status   Specimen Description   Final    BLOOD LEFT FOREARM BOTTLES DRAWN AEROBIC AND ANAEROBIC   Special Requests Blood Culture  adequate volume  Final   Culture   Final    NO GROWTH < 12 HOURS Performed at [facility], [address] [place], [place] [address]    Report Status PENDING  Incomplete    Radiology Reports CT ABDOMEN PELVIS WO CONTRAST  Result Date: 08/08/2022 CLINICAL DATA:  Abdominal pain, acute, nonlocalized abd x 3 days, V/D EXAM: CT ABDOMEN AND PELVIS WITHOUT CONTRAST TECHNIQUE: Multidetector CT imaging of the abdomen and pelvis was performed following the standard protocol without IV contrast. RADIATION DOSE REDUCTION: This exam was performed according to the departmental dose-optimization program which includes automated exposure control, adjustment of the mA and/or kV according to patient size and/or use of iterative reconstruction technique. COMPARISON:  August 04, 2017 FINDINGS: Evaluation is limited due to lack of IV contrast. Lower chest: Similar rounded opacity at the LEFT lung base, most consistent with rounded atelectasis. RIGHT upper lobe pulmonary nodule measures 3 mm (series 4, image 1). Hepatobiliary: Hepatic steatosis.  Cholelithiasis. Pancreas: No peripancreatic fat stranding. Spleen: Unremarkable. Adrenals/Urinary Tract: Adrenal glands are unremarkable. Punctate nonobstructing bilateral nephrolithiasis, the largest of which measures 4 mm in the superior pole of the RIGHT kidney. No obstructing nephrolithiasis. Bladder is decompressed. Stomach/Bowel: There are multiple mildly dilated loops small bowel in the mid abdomen without discrete transition point. Bowel loops gently taper in the RIGHT lower quadrant (series 2, image 73). Representative loop of small bowel measures approximately 4 cm in diameter. Appendix is normal. Stomach is decompressed. Vascular/Lymphatic: Atherosclerotic calcifications of the nonaneurysmal abdominal aorta. There are prominent mesenteric lymph nodes without frank adenopathy by size criteria. Reproductive: Coarse calcifications of the prostate. Other: Small fat containing  RIGHT inguinal hernia. No free air or free fluid. Musculoskeletal: No acute or significant osseous findings. IMPRESSION: 1. There are multiple mildly dilated loops of small  bowel in the mid abdomen without discrete transition point. Bowel loops gently taper in the RIGHT lower quadrant. Findings could reflect a nonspecific enteritis/ileus or partial small bowel obstruction. 2. Hepatic steatosis. 3. There is a 3 mm RIGHT upper lobe pulmonary nodule. Per Fleischner Society Guidelines, if patient is low risk for malignancy, no routine follow-up imaging is recommended. If patient is high risk for malignancy, a non-contrast Chest CT at 12 months is optional. If performed and the nodule is stable at 12 months, no further follow-up is recommended. These guidelines do not apply to immunocompromised patients and patients with cancer. Follow up in patients with significant comorbidities as clinically warranted. For lung cancer screening, adhere to Lung-RADS guidelines. Reference: Radiology. 2017; 284(1):228-43. Aortic Atherosclerosis (ICD10-I70.0). Electronically Signed   By: Valentino Saxon M.D.   On: 08/08/2022 14:01   DG Chest Port 1 View  Result Date: 08/08/2022 CLINICAL DATA:  Sepsis. EXAM: PORTABLE CHEST 1 VIEW COMPARISON:  08/04/2017 FINDINGS: The lungs are clear without focal pneumonia, edema, pneumothorax or pleural effusion. Cardiopericardial silhouette is at upper limits of normal for size. Status post CABG. The visualized bony structures of the thorax are unremarkable. IMPRESSION: No active disease. Electronically Signed   By: Misty Stanley M.D.   On: 08/08/2022 13:05   DG Abd 2 Views  Result Date: 08/08/2022 CLINICAL DATA:  Abdominal pain for 5 days with diarrhea. EXAM: ABDOMEN - 2 VIEW COMPARISON:  None Available. FINDINGS: Lung bases are normal. No free air, portal venous gas, or pneumatosis. Dilated loops of small bowel are identified primarily in the left side of the abdomen. There is a paucity of  colonic gas. Probable right renal stones. No other significant abnormalities are identified. IMPRESSION: Small bowel obstruction. Recommend CT imaging for better evaluation. Right renal stone suspected. These results will be called to the ordering clinician or representative by the Radiologist Assistant, and communication documented in the PACS or [employer]. Electronically Signed   By: Dorise Bullion III M.D.   On: 08/08/2022 10:53    SIGNED: Deatra James, MD, FHM. FAAFP. Zacarias Pontes - [facility] Time spent > 55 min.  In seeing, evaluating and examining the patient. Reviewing medical records, labs, drawn plan of care. [facility],  Pager (please use [email or web address] to page/ text) Please use Epic Secure Chat for non-urgent communication (7AM-7PM)  If 7PM-7AM, please contact night-coverage [email or web address], 08/09/2022, 9:43 AM

## 2022-08-10 DIAGNOSIS — K529 Noninfective gastroenteritis and colitis, unspecified: Secondary | ICD-10-CM | POA: Diagnosis not present

## 2022-08-10 LAB — HEMOGLOBIN A1C
Hgb A1c MFr Bld: 7.8 % — ABNORMAL HIGH (ref 4.8–5.6)
Mean Plasma Glucose: 177 mg/dL

## 2022-08-10 LAB — BASIC METABOLIC PANEL
Anion gap: 9 (ref 5–15)
BUN: 40 mg/dL — ABNORMAL HIGH (ref 8–23)
CO2: 18 mmol/L — ABNORMAL LOW (ref 22–32)
Calcium: 8.6 mg/dL — ABNORMAL LOW (ref 8.9–10.3)
Chloride: 108 mmol/L (ref 98–111)
Creatinine, Ser: 1.21 mg/dL (ref 0.61–1.24)
GFR, Estimated: >60 mL/min (ref >60)
Glucose, Bld: 191 mg/dL — ABNORMAL HIGH (ref 70–99)
Potassium: 3.8 mmol/L (ref 3.5–5.1)
Sodium: 135 mmol/L (ref 135–145)

## 2022-08-10 LAB — GLUCOSE, CAPILLARY
Glucose-Capillary: 142 mg/dL — ABNORMAL HIGH (ref 70–99)
Glucose-Capillary: 186 mg/dL — ABNORMAL HIGH (ref 70–99)
Glucose-Capillary: 201 mg/dL — ABNORMAL HIGH (ref 70–99)
Glucose-Capillary: 205 mg/dL — ABNORMAL HIGH (ref 70–99)
Glucose-Capillary: 285 mg/dL — ABNORMAL HIGH (ref 70–99)

## 2022-08-10 MED ORDER — AMLODIPINE BESYLATE 5 MG PO TABS
5.0000 mg | ORAL_TABLET | Freq: Every day | ORAL | Status: DC
  Administered 2022-08-10 – 2022-08-11 (×2): 5 mg via ORAL
  Filled 2022-08-10 (×2): qty 1

## 2022-08-10 MED ORDER — METOPROLOL TARTRATE 25 MG PO TABS
25.0000 mg | ORAL_TABLET | Freq: Two times a day (BID) | ORAL | Status: DC
  Administered 2022-08-10 – 2022-08-11 (×3): 25 mg via ORAL
  Filled 2022-08-10 (×3): qty 1

## 2022-08-10 MED ORDER — SODIUM CHLORIDE 0.9 % IV SOLN: INTRAVENOUS | Status: AC

## 2022-08-10 NOTE — Progress Notes (Signed)
PROGRESS NOTE    Patient: Jonathan Shepard                            PCP: Redmond School, MD                    DOB: 07-04-1950            DOA: 08/08/2022 [id number]             DOS: 08/10/2022, 11:28 AM   LOS: 1 day   Date of Service: The patient was seen and examined on 08/10/2022  Subjective:   The patient was seen and examined this morning, reporting gas and bowel movements Willing to advance his diet Still having some abdominal discomfort  Brief Narrative:    Jonathan Shepard is a 72 y.o. male with medical history significant for CABG, HTN, DM.   -Patient presented to the ED with complaints of diffuse abdominal pain, nausea and vomiting.  Symptoms started 3 days ago on Wednesday 1/28.  Initially had at least 8-9 episodes each of both vomiting and diarrhea.  Vomiting resolved by the next day, but he has had persistent diarrhea.  He had 1 loose bowel movement today.  No fevers.  He has slightly reduced urinary output, otherwise no urinary symptoms. No recent antibiotics. Presented to urgent care, x-ray showed SBO, right kidney stone.  He was referred to the ED.   ED Course: Blood pressure down to 86/62 improved after IV fluids to 99/58.  Fattening elevated at 4.59.  Lactic acid 1.8 > 0.1.  WBC 7.8. CT abdomen and pelvis-multiple mildly dilated loops of small bowel in the mid abdomen without discrete transition point.  Nonspecific enteritis/ileus or partial small bowel obstruction. 2 L bolus given in ED.    Assessment & Plan:   Principal Problem:   Gastroenteritis Active Problems:   AKI (acute kidney injury) ([place])   Non-insulin dependent type 2 diabetes mellitus ([facility])   PAD (peripheral artery disease) ([facility])   S/P CABG x 4   HTN (hypertension)   Small bowel obstruction ([facility])     Assessment and Plan: * Gastroenteritis POA : Abdominal pain, nausea vomiting>>> improved -Reporting a bowel movement and gas  -Sepsis was ruled out - CT-suggest enteritis/ileus or  partial small bowel obstruction.  Lactic acid 1.8 > 2.1 -Trend lactic acid -On clear liquid diet, advancing as tolerated  - Per Dr. Okey Dupre, no need for NGT at this time . - Cont. IVF -IV Dilaudid 0.5 mg every 4 hourly as needed -Zofran as needed  AKI (acute kidney injury) ([place]) - Improving -Likely due to dehydration: From nausea vomiting diarrhea -We will holding ACE inhibitor-lisinopril  Lab Results  Component Value Date   CREATININE 1.21 08/10/2022   CREATININE 2.74 (H) 08/09/2022   CREATININE 4.59 (H) 08/08/2022    - CT abdomen showed-punctate nonobstructing bilateral left for lithiasis, largest in the superior pole of right kidney measuring 4 mm.  No obstructing nephrolithiasis.   - Give 3rd L bolus, as still no urine output, then continue N/s 100cc/hr x 1 day   Small bowel obstruction ([facility]) -Reporting of gas and bowel movements -Small bowel follow-through x-ray contrast throughout the colon  HTN (hypertension) - Mild Hypotension -Resume Norvasc, metoprolol, ramipril  S/P CABG x 4 Stable. -Resume aspirin, Plavix, statins  PAD (peripheral artery disease) ([facility]) Resume Aspirin, statins, Plavix.  Non-insulin dependent type 2 diabetes mellitus ([place]) -Hypoglycemic -Continue with D5 half-normal  saline -CBG every 6 hours  - SSI-  -Hold Metformin, glimepiride - HgbA1c 7.8    ---------------------------------------------------------------------------------------------------------------------  DVT prophylaxis:  heparin injection 5,000 Units Start: 08/08/22 1700   Code Status:   Code Status: Full Code  Family Communication: No family member present at bedside- attempt will be made to update daily The above findings and plan of care has been discussed with patient (and family)  in detail,  they expressed understanding and agreement of above. -Advance care planning has been discussed.   Admission status:   Status is: Observation The patient remains OBS  appropriate and will d/c before 2 midnights.   Disposition: From  - home             Planning for discharge in 1-2 days: to   Procedures:   No admission procedures for hospital encounter.   Antimicrobials:  Anti-infectives (From admission, onward)    None        Medication:   bisacodyl  10 mg Rectal Daily   diatrizoate meglumine-sodium  90 mL Per NG tube Once   heparin  5,000 Units Subcutaneous Q8H   insulin aspart  0-9 Units Subcutaneous Q6H   polyethylene glycol  17 g Oral Daily   senna-docusate  1 tablet Oral BID    acetaminophen **OR** acetaminophen, HYDROmorphone (DILAUDID) injection, ondansetron **OR** ondansetron (ZOFRAN) IV   Objective:   Vitals:   08/09/22 1348 08/09/22 2002 08/10/22 0451 08/10/22 0756  BP: (!) 145/79 (!) 150/92 (!) 155/90 (!) 157/81  Pulse: 93 84 90 96  Resp: '18 20 18 18  '$ Temp: 97.8 F (36.6 C) 97.7 F (36.5 C) 98.1 F (36.7 C) 98.4 F (36.9 C)  TempSrc: Oral Oral  Oral  SpO2: 99% 99% 98% 97%  Weight:      Height:        Intake/Output Summary (Last 24 hours) at 08/10/2022 1128 Last data filed at 08/10/2022 0524 Gross per 24 hour  Intake 2864.04 ml  Output 1250 ml  Net 1614.04 ml   Filed Weights   08/08/22 1115 08/08/22 1701  Weight: 100.7 kg 93.5 kg     Physical examination:     General:  AAO x 3,  cooperative, no distress;   HEENT:  Normocephalic, PERRL, otherwise with in Normal limits   Neuro:  CNII-XII intact. , normal motor and sensation, reflexes intact   Lungs:   Clear to auscultation BL, Respirations unlabored,  No wheezes / crackles  Cardio:    S1/S2, RRR, No murmure, No Rubs or Gallops   Abdomen:  Soft, non-tender, bowel sounds active all four quadrants, no guarding or peritoneal signs.  Muscular  skeletal:  Limited exam -global generalized weaknesses - in bed, able to move all 4 extremities,   2+ pulses,  symmetric, No pitting edema  Skin:  Dry, warm to touch, negative for any Rashes,  Wounds: Please see  nursing documentation        -------------------------------------------------------------------------------------------------------------    LABs:     Latest Ref Rng & Units 08/09/2022    4:14 AM 08/08/2022   12:13 PM 08/06/2017    8:17 AM  CBC  WBC 4.0 - 10.5 K/uL 5.0  7.8  6.3   Hemoglobin 13.0 - 17.0 g/dL 13.5  16.2  13.8   Hematocrit 39.0 - 52.0 % 41.0  49.6  42.4   Platelets 150 - 400 K/uL 151  220  181       Latest Ref Rng & Units 08/10/2022    4:15 AM  08/09/2022    4:14 AM 08/08/2022   12:13 PM  CMP  Glucose 70 - 99 mg/dL 191  62  139   BUN 8 - 23 mg/dL 40  78  79   Creatinine 0.61 - 1.24 mg/dL 1.21  2.74  4.59   Sodium 135 - 145 mmol/L 135  135  131   Potassium 3.5 - 5.1 mmol/L 3.8  3.9  4.7   Chloride 98 - 111 mmol/L 108  106  100   CO2 22 - 32 mmol/L '18  17  18   '$ Calcium 8.9 - 10.3 mg/dL 8.6  8.2  9.2   Total Protein 6.5 - 8.1 g/dL   8.1   Total Bilirubin 0.3 - 1.2 mg/dL   0.5   Alkaline Phos 38 - 126 U/L   58   AST 15 - 41 U/L   34   ALT 0 - 44 U/L   37        Micro Results Recent Results (from the past 240 hour(s))  Blood Culture (routine x 2)     Status: None (Preliminary result)   Collection Time: 08/08/22 12:13 PM   Specimen: Right Antecubital; Blood  Result Value Ref Range Status   Specimen Description   Final    RIGHT ANTECUBITAL BOTTLES DRAWN AEROBIC AND ANAEROBIC   Special Requests   Final    Blood Culture results may not be optimal due to an excessive volume of blood received in culture bottles   Culture   Final    NO GROWTH 2 DAYS Performed at [facility], [address] [place], [place] [address]    Report Status PENDING  Incomplete  Blood Culture (routine x 2)     Status: None (Preliminary result)   Collection Time: 08/08/22 12:18 PM   Specimen: BLOOD LEFT FOREARM  Result Value Ref Range Status   Specimen Description   Final    BLOOD LEFT FOREARM BOTTLES DRAWN AEROBIC AND ANAEROBIC   Special Requests Blood Culture adequate volume  Final    Culture   Final    NO GROWTH 2 DAYS Performed at [facility], [address], [place], [place] [address]    Report Status PENDING  Incomplete  C Difficile Quick Screen w PCR reflex     Status: None   Collection Time: 08/09/22 12:07 PM   Specimen: STOOL  Result Value Ref Range Status   C Diff antigen NEGATIVE NEGATIVE Final   C Diff toxin NEGATIVE NEGATIVE Final   C Diff interpretation No C. difficile detected.  Final    Comment: Performed at [facility], [address] [place], [place] [address]    Radiology Reports DG Abd Portable 1V-Small Bowel Obstruction Protocol-initial, 8 hr delay  Result Date: 08/09/2022 CLINICAL DATA:  Small-bowel obstruction, 8 hour delay. 60 mL of Gastrografin EXAM: PORTABLE ABDOMEN - 1 VIEW COMPARISON:  Abdominal radiograph performed earlier on the same date at 10:09 a.m. FINDINGS: There is ingested contrast in the colon and rectum. Distended small bowel loops measuring up to 5 cm suggesting ileus/partial obstruction. No acute osseous abnormality IMPRESSION: Ingested contrast is now seen in the colon and rectum. Distended small bowel loops measuring up to 5 cm suggesting ileus/partial obstruction. Electronically Signed   By: Keane Police D.O.   On: 08/09/2022 18:04    SIGNED: Deatra James, MD, FHM. FAAFP. Zacarias Pontes - [facility] Time spent > 45 min.  In seeing, evaluating and examining the patient. Reviewing medical records, labs, drawn  plan of care. [facility],  Pager (please use [email or web address] to page/ text) Please use Epic Secure Chat for non-urgent communication (7AM-7PM)  If 7PM-7AM, please contact night-coverage [email or web address], 08/10/2022, 11:28 AM

## 2022-08-10 NOTE — Assessment & Plan Note (Signed)
-  Reporting of gas and bowel movements -Small bowel follow-through x-ray contrast throughout the colon

## 2022-08-10 NOTE — Care Management Important Message (Signed)
Important Message  Patient Details  Name: Jonathan Shepard MRN: [id number] Date of Birth: 29-Sep-1950   Medicare Important Message Given:  N/A - LOS <3 / Initial given by admissions     Tommy Medal 08/10/2022, 12:13 PM

## 2022-08-10 NOTE — Progress Notes (Signed)
Patient stated that he had a BM and he is passing gas during this shift. No complaints of pain, N/V.

## 2022-08-10 NOTE — Progress Notes (Signed)
Mobility Specialist Progress Note:    08/10/22 1139  Mobility  Activity Ambulated with assistance in hallway  Level of Assistance Modified independent, requires aide device or extra time  Assistive Device Other (Comment) (IV pole)  Distance Ambulated (ft) 200 ft  Activity Response Tolerated well  Mobility Referral Yes  $Mobility charge 1 Mobility   Pt was agreeable to mobility session. ModI with IV pole, SB required for safety. Tolerated session well, asx throughout. Returned pt to chair with all needs met.   Royetta Crochet Mobility Specialist Please contact via Solicitor or  Rehab office at [phone]

## 2022-08-10 NOTE — Progress Notes (Signed)
[facility] Progress Note     Subjective: Patient seen and examined.  He is resting comfortably in bed.  He denies any further abdominal cramping.  He is tolerating a diet without nausea and vomiting.  He confirms having numerous bowel movements yesterday and is passing flatus.  Objective: Vital signs in last 24 hours: Temp:  [97.7 F (36.5 C)-98.4 F (36.9 C)] 98.4 F (36.9 C) (03/04 0756) Pulse Rate:  [84-96] 96 (03/04 0756) Resp:  [18-20] 18 (03/04 0756) BP: (145-157)/(79-92) 157/81 (03/04 0756) SpO2:  [97 %-99 %] 97 % (03/04 0756) Last BM Date : 08/09/22  Intake/Output from previous day: 03/03 0701 - 03/04 0700 In: 2864 [P.O.:240; I.V.:2624] Out: 1700 [Urine:1700] Intake/Output this shift: No intake/output data recorded.  General appearance: alert, cooperative, and no distress GI: Abdomen soft, mild distention, no percussion tenderness, non tender to palpation; no rigidity, guarding or rebound tenderness  Lab Results:  Recent Labs    08/08/22 1213 08/09/22 0414  WBC 7.8 5.0  HGB 16.2 13.5  HCT 49.6 41.0  PLT 220 151   BMET Recent Labs    08/09/22 0414 08/10/22 0415  NA 135 135  K 3.9 3.8  CL 106 108  CO2 17* 18*  GLUCOSE 62* 191*  BUN 78* 40*  CREATININE 2.74* 1.21  CALCIUM 8.2* 8.6*   PT/INR Recent Labs    08/08/22 1213  LABPROT 12.7  INR 1.0    Studies/Results: DG Abd Portable 1V-Small Bowel Obstruction Protocol-initial, 8 hr delay  Result Date: 08/09/2022 CLINICAL DATA:  Small-bowel obstruction, 8 hour delay. 60 mL of Gastrografin EXAM: PORTABLE ABDOMEN - 1 VIEW COMPARISON:  Abdominal radiograph performed earlier on the same date at 10:09 a.m. FINDINGS: There is ingested contrast in the colon and rectum. Distended small bowel loops measuring up to 5 cm suggesting ileus/partial obstruction. No acute osseous abnormality IMPRESSION: Ingested contrast is now seen in the colon and rectum. Distended small bowel loops measuring up to 5  cm suggesting ileus/partial obstruction. Electronically Signed   By: Keane Police D.O.   On: 08/09/2022 18:04   DG Abd Portable 1V-Small Bowel Protocol-Position Verification  Result Date: 08/09/2022 CLINICAL DATA:  Encounter for imaging study to confirm nasogastric tube placement. Technologist notes state 8 hour delay image, no nasogastric tube in place. Contrast administered p.o. per MD. 60 cc Gastrografin administered. EXAM: PORTABLE ABDOMEN - 1 VIEW COMPARISON:  Abdominopelvic CT yesterday. FINDINGS: Contrast opacifies the stomach and duodenum. Gaseous small bowel distention in the left abdomen, small-bowel measures up to 5.1 cm. There is no enteric contrast in the colon. Intrarenal calculi on prior CT are not well delineated due to overlying bowel gas. No nasogastric tube is seen. IMPRESSION: 1. Contrast opacifies the stomach and duodenum. No contrast in the colon. 2. Gaseous small bowel distention in the left abdomen, similar to yesterday's CT. Electronically Signed   By: Keith Rake M.D.   On: 08/09/2022 11:28   CT ABDOMEN PELVIS WO CONTRAST  Result Date: 08/08/2022 CLINICAL DATA:  Abdominal pain, acute, nonlocalized abd x 3 days, V/D EXAM: CT ABDOMEN AND PELVIS WITHOUT CONTRAST TECHNIQUE: Multidetector CT imaging of the abdomen and pelvis was performed following the standard protocol without IV contrast. RADIATION DOSE REDUCTION: This exam was performed according to the departmental dose-optimization program which includes automated exposure control, adjustment of the mA and/or kV according to patient size and/or use of iterative reconstruction technique. COMPARISON:  August 04, 2017 FINDINGS: Evaluation is limited due to lack of IV contrast. Lower  chest: Similar rounded opacity at the LEFT lung base, most consistent with rounded atelectasis. RIGHT upper lobe pulmonary nodule measures 3 mm (series 4, image 1). Hepatobiliary: Hepatic steatosis.  Cholelithiasis. Pancreas: No peripancreatic fat  stranding. Spleen: Unremarkable. Adrenals/Urinary Tract: Adrenal glands are unremarkable. Punctate nonobstructing bilateral nephrolithiasis, the largest of which measures 4 mm in the superior pole of the RIGHT kidney. No obstructing nephrolithiasis. Bladder is decompressed. Stomach/Bowel: There are multiple mildly dilated loops small bowel in the mid abdomen without discrete transition point. Bowel loops gently taper in the RIGHT lower quadrant (series 2, image 73). Representative loop of small bowel measures approximately 4 cm in diameter. Appendix is normal. Stomach is decompressed. Vascular/Lymphatic: Atherosclerotic calcifications of the nonaneurysmal abdominal aorta. There are prominent mesenteric lymph nodes without frank adenopathy by size criteria. Reproductive: Coarse calcifications of the prostate. Other: Small fat containing RIGHT inguinal hernia. No free air or free fluid. Musculoskeletal: No acute or significant osseous findings. IMPRESSION: 1. There are multiple mildly dilated loops of small bowel in the mid abdomen without discrete transition point. Bowel loops gently taper in the RIGHT lower quadrant. Findings could reflect a nonspecific enteritis/ileus or partial small bowel obstruction. 2. Hepatic steatosis. 3. There is a 3 mm RIGHT upper lobe pulmonary nodule. Per Fleischner Society Guidelines, if patient is low risk for malignancy, no routine follow-up imaging is recommended. If patient is high risk for malignancy, a non-contrast Chest CT at 12 months is optional. If performed and the nodule is stable at 12 months, no further follow-up is recommended. These guidelines do not apply to immunocompromised patients and patients with cancer. Follow up in patients with significant comorbidities as clinically warranted. For lung cancer screening, adhere to Lung-RADS guidelines. Reference: Radiology. 2017; 284(1):228-43. Aortic Atherosclerosis (ICD10-I70.0). Electronically Signed   By: Valentino Saxon  M.D.   On: 08/08/2022 14:01   DG Chest Port 1 View  Result Date: 08/08/2022 CLINICAL DATA:  Sepsis. EXAM: PORTABLE CHEST 1 VIEW COMPARISON:  08/04/2017 FINDINGS: The lungs are clear without focal pneumonia, edema, pneumothorax or pleural effusion. Cardiopericardial silhouette is at upper limits of normal for size. Status post CABG. The visualized bony structures of the thorax are unremarkable. IMPRESSION: No active disease. Electronically Signed   By: Misty Stanley M.D.   On: 08/08/2022 13:05    Anti-infectives: Anti-infectives (From admission, onward)    None       Assessment/Plan:  Patient is a 72 year old male who was admitted with AKI and enteritis/ileus versus partial small bowel obstruction.  -Patient underwent small bowel obstruction protocol yesterday which demonstrated contrast throughout the colon on 8-hour image -He tolerated clear liquid diet without nausea and vomiting -Advance to regular diet -No acute surgical intervention at this time -If patient tolerates solid food, stable for discharge from general surgery standpoint -Patient may resume his Plavix -Care per primary team.  Please call with any questions or concerns    LOS: 1 day    Dudley Cooley A Yvonne Stopher 08/10/2022

## 2022-08-11 DIAGNOSIS — K529 Noninfective gastroenteritis and colitis, unspecified: Secondary | ICD-10-CM | POA: Diagnosis not present

## 2022-08-11 LAB — BASIC METABOLIC PANEL
Anion gap: 8 (ref 5–15)
BUN: 20 mg/dL (ref 8–23)
CO2: 20 mmol/L — ABNORMAL LOW (ref 22–32)
Calcium: 8.9 mg/dL (ref 8.9–10.3)
Chloride: 110 mmol/L (ref 98–111)
Creatinine, Ser: 0.99 mg/dL (ref 0.61–1.24)
GFR, Estimated: >60 mL/min (ref >60)
Glucose, Bld: 145 mg/dL — ABNORMAL HIGH (ref 70–99)
Potassium: 4 mmol/L (ref 3.5–5.1)
Sodium: 138 mmol/L (ref 135–145)

## 2022-08-11 LAB — GLUCOSE, CAPILLARY
Glucose-Capillary: 179 mg/dL — ABNORMAL HIGH (ref 70–99)
Glucose-Capillary: 129 mg/dL — ABNORMAL HIGH (ref 70–99)

## 2022-08-11 NOTE — Discharge Summary (Signed)
Physician Discharge Summary   Patient: Jonathan Shepard MRN: [id number] DOB: 30-Nov-1950  Admit date:     08/08/2022  Discharge date: 08/11/22  Discharge Physician: Deatra James   PCP: Redmond School, MD   Recommendations at discharge:  Follow with the PCP in 1-2 weeks Continue to advance her diet slowly  Discharge Diagnoses: Principal Problem:   Gastroenteritis Active Problems:   AKI (acute kidney injury) ([place])   Non-insulin dependent type 2 diabetes mellitus ([place])   PAD (peripheral artery disease) (HCC)   S/P CABG x 4   HTN (hypertension)   Small bowel obstruction (HCC)  Resolved Problems:   * No resolved hospital problems. *  Hospital Course:  Jonathan Shepard is a 72 y.o. male with medical history significant for CABG, HTN, DM.   -Patient presented to the ED with complaints of diffuse abdominal pain, nausea and vomiting.  Symptoms started 3 days ago on Wednesday 1/28.  Initially had at least 8-9 episodes each of both vomiting and diarrhea.  Vomiting resolved by the next day, but he has had persistent diarrhea.  He had 1 loose bowel movement today.  No fevers.  He has slightly reduced urinary output, otherwise no urinary symptoms. No recent antibiotics. Presented to urgent care, x-ray showed SBO, right kidney stone.  He was referred to the ED.   ED Course: Blood pressure down to 86/62 improved after IV fluids to 99/58.  Fattening elevated at 4.59.  Lactic acid 1.8 > 0.1.  WBC 7.8. CT abdomen and pelvis-multiple mildly dilated loops of small bowel in the mid abdomen without discrete transition point.  Nonspecific enteritis/ileus or partial small bowel obstruction. 2 L bolus given in ED.   * Gastroenteritis POA : Abdominal pain, nausea vomiting>>> improved -Reporting a bowel movement and gas  -Sepsis was ruled out - CT-suggest enteritis/ileus or partial small bowel obstruction.  Lactic acid 1.8 > 2.1   - Per Dr. Okey Dupre,  No NG tube was placed on this  admission, status post IV fluid resuscitation, Was started on clear liquid diet, was advanced slowly, tolerating p.o. now To be discharged home  AKI (acute kidney injury) ([place]) - Improving -Likely due to dehydration: From nausea vomiting diarrhea -We will holding ACE inhibitor-lisinopril  Lab Results  Component Value Date   CREATININE 1.21 08/10/2022   CREATININE 2.74 (H) 08/09/2022   CREATININE 4.59 (H) 08/08/2022    - CT abdomen showed-punctate nonobstructing bilateral left for lithiasis, largest in the superior pole of right kidney measuring 4 mm.  No obstructing nephrolithiasis.   - Give 3rd L bolus, as still no urine output, then continue N/s 100cc/hr x 1 day   Small bowel obstruction (HCC) -Reporting of gas and bowel movements -Small bowel follow-through x-ray contrast throughout the colon -Resolved  HTN (hypertension) - Mild Hypotension -Resume Norvasc, metoprolol, ramipril  S/P CABG x 4 Stable. -Resume aspirin, Plavix, statins  PAD (peripheral artery disease) (HCC) Resume Aspirin, statins, Plavix.  Non-insulin dependent type 2 diabetes mellitus ([place]) -Hypoglycemic -resolved  - SSI-  -Resume  Metformin, glimepiride - HgbA1c 7.8           Procedures performed: Small bowel follow-through Disposition: Home Diet recommendation:  Discharge Diet Orders (From admission, onward)     Start     Ordered   08/11/22 0000  Diet - low sodium heart healthy        08/11/22 0854           Carb modified diet DISCHARGE MEDICATION: Allergies as of  08/11/2022   No Known Allergies      Medication List     STOP taking these medications    diclofenac 75 MG EC tablet Commonly known as: VOLTAREN       TAKE these medications    acetaminophen 500 MG tablet Commonly known as: TYLENOL Take 1,000 mg by mouth daily as needed for moderate pain, fever or headache.   amLODipine 10 MG tablet Commonly known as: NORVASC Take 1 tablet (10 mg total) by mouth  daily.   clopidogrel 75 MG tablet Commonly known as: PLAVIX Take 1 tablet (75 mg total) by mouth daily.   fenofibrate 160 MG tablet Take 160 mg by mouth daily. What changed: Another medication with the same name was removed. Continue taking this medication, and follow the directions you see here.   glimepiride 4 MG tablet Commonly known as: AMARYL Take 4 mg by mouth 2 (two) times daily.   HYDROcodone-acetaminophen 10-325 MG tablet Commonly known as: NORCO Take 1 tablet by mouth every 6 (six) hours as needed for moderate pain.   metFORMIN 1000 MG tablet Commonly known as: GLUCOPHAGE Take 1,000 mg by mouth 2 (two) times daily with a meal.   metoprolol tartrate 50 MG tablet Commonly known as: LOPRESSOR Take 50 mg by mouth daily. What changed: Another medication with the same name was removed. Continue taking this medication, and follow the directions you see here.   multivitamin tablet Take 1 tablet by mouth daily.   Oxycodone HCl 10 MG Tabs Take 10 mg by mouth daily as needed (breakthrough pain).   pantoprazole 40 MG tablet Commonly known as: PROTONIX TAKE 1 TABLET EVERY DAY   pravastatin 80 MG tablet Commonly known as: PRAVACHOL TAKE 1 TABLET EVERY DAY   ramipril 10 MG capsule Commonly known as: ALTACE Take 1 capsule (10 mg total) by mouth daily.        Follow-up Information     Pappayliou, Flint Melter, DO. Call.   Specialty: General Surgery Why: As needed Contact information: [address] [facility] [id number] [phone]                Discharge Exam: Filed Weights   08/08/22 1115 08/08/22 1701  Weight: 100.7 kg 93.5 kg        General:  AAO x 3,  cooperative, no distress;   HEENT:  Normocephalic, PERRL, otherwise with in Normal limits   Neuro:  CNII-XII intact. , normal motor and sensation, reflexes intact   Lungs:   Clear to auscultation BL, Respirations unlabored,  No wheezes / crackles  Cardio:    S1/S2, RRR, No murmure, No  Rubs or Gallops   Abdomen:  Soft, non-tender, bowel sounds active all four quadrants, no guarding or peritoneal signs.  Muscular  skeletal:  Limited exam -global generalized weaknesses - in bed, able to move all 4 extremities,   2+ pulses,  symmetric, No pitting edema  Skin:  Dry, warm to touch, negative for any Rashes,  Wounds: Please see nursing documentation          Condition at discharge: good  The results of significant diagnostics from this hospitalization (including imaging, microbiology, ancillary and laboratory) are listed below for reference.   Imaging Studies: DG Abd Portable 1V-Small Bowel Obstruction Protocol-initial, 8 hr delay  Result Date: 08/09/2022 CLINICAL DATA:  Small-bowel obstruction, 8 hour delay. 60 mL of Gastrografin EXAM: PORTABLE ABDOMEN - 1 VIEW COMPARISON:  Abdominal radiograph performed earlier on the same date at 10:09 a.m. FINDINGS: There  is ingested contrast in the colon and rectum. Distended small bowel loops measuring up to 5 cm suggesting ileus/partial obstruction. No acute osseous abnormality IMPRESSION: Ingested contrast is now seen in the colon and rectum. Distended small bowel loops measuring up to 5 cm suggesting ileus/partial obstruction. Electronically Signed   By: Keane Police D.O.   On: 08/09/2022 18:04   DG Abd Portable 1V-Small Bowel Protocol-Position Verification  Result Date: 08/09/2022 CLINICAL DATA:  Encounter for imaging study to confirm nasogastric tube placement. Technologist notes state 8 hour delay image, no nasogastric tube in place. Contrast administered p.o. per MD. 60 cc Gastrografin administered. EXAM: PORTABLE ABDOMEN - 1 VIEW COMPARISON:  Abdominopelvic CT yesterday. FINDINGS: Contrast opacifies the stomach and duodenum. Gaseous small bowel distention in the left abdomen, small-bowel measures up to 5.1 cm. There is no enteric contrast in the colon. Intrarenal calculi on prior CT are not well delineated due to overlying bowel gas.  No nasogastric tube is seen. IMPRESSION: 1. Contrast opacifies the stomach and duodenum. No contrast in the colon. 2. Gaseous small bowel distention in the left abdomen, similar to yesterday's CT. Electronically Signed   By: Keith Rake M.D.   On: 08/09/2022 11:28   CT ABDOMEN PELVIS WO CONTRAST  Result Date: 08/08/2022 CLINICAL DATA:  Abdominal pain, acute, nonlocalized abd x 3 days, V/D EXAM: CT ABDOMEN AND PELVIS WITHOUT CONTRAST TECHNIQUE: Multidetector CT imaging of the abdomen and pelvis was performed following the standard protocol without IV contrast. RADIATION DOSE REDUCTION: This exam was performed according to the departmental dose-optimization program which includes automated exposure control, adjustment of the mA and/or kV according to patient size and/or use of iterative reconstruction technique. COMPARISON:  August 04, 2017 FINDINGS: Evaluation is limited due to lack of IV contrast. Lower chest: Similar rounded opacity at the LEFT lung base, most consistent with rounded atelectasis. RIGHT upper lobe pulmonary nodule measures 3 mm (series 4, image 1). Hepatobiliary: Hepatic steatosis.  Cholelithiasis. Pancreas: No peripancreatic fat stranding. Spleen: Unremarkable. Adrenals/Urinary Tract: Adrenal glands are unremarkable. Punctate nonobstructing bilateral nephrolithiasis, the largest of which measures 4 mm in the superior pole of the RIGHT kidney. No obstructing nephrolithiasis. Bladder is decompressed. Stomach/Bowel: There are multiple mildly dilated loops small bowel in the mid abdomen without discrete transition point. Bowel loops gently taper in the RIGHT lower quadrant (series 2, image 73). Representative loop of small bowel measures approximately 4 cm in diameter. Appendix is normal. Stomach is decompressed. Vascular/Lymphatic: Atherosclerotic calcifications of the nonaneurysmal abdominal aorta. There are prominent mesenteric lymph nodes without frank adenopathy by size criteria.  Reproductive: Coarse calcifications of the prostate. Other: Small fat containing RIGHT inguinal hernia. No free air or free fluid. Musculoskeletal: No acute or significant osseous findings. IMPRESSION: 1. There are multiple mildly dilated loops of small bowel in the mid abdomen without discrete transition point. Bowel loops gently taper in the RIGHT lower quadrant. Findings could reflect a nonspecific enteritis/ileus or partial small bowel obstruction. 2. Hepatic steatosis. 3. There is a 3 mm RIGHT upper lobe pulmonary nodule. Per Fleischner Society Guidelines, if patient is low risk for malignancy, no routine follow-up imaging is recommended. If patient is high risk for malignancy, a non-contrast Chest CT at 12 months is optional. If performed and the nodule is stable at 12 months, no further follow-up is recommended. These guidelines do not apply to immunocompromised patients and patients with cancer. Follow up in patients with significant comorbidities as clinically warranted. For lung cancer screening, adhere to Lung-RADS guidelines. Reference:  Radiology. 2017; 284(1):228-43. Aortic Atherosclerosis (ICD10-I70.0). Electronically Signed   By: Valentino Saxon M.D.   On: 08/08/2022 14:01   DG Chest Port 1 View  Result Date: 08/08/2022 CLINICAL DATA:  Sepsis. EXAM: PORTABLE CHEST 1 VIEW COMPARISON:  08/04/2017 FINDINGS: The lungs are clear without focal pneumonia, edema, pneumothorax or pleural effusion. Cardiopericardial silhouette is at upper limits of normal for size. Status post CABG. The visualized bony structures of the thorax are unremarkable. IMPRESSION: No active disease. Electronically Signed   By: Misty Stanley M.D.   On: 08/08/2022 13:05   DG Abd 2 Views  Result Date: 08/08/2022 CLINICAL DATA:  Abdominal pain for 5 days with diarrhea. EXAM: ABDOMEN - 2 VIEW COMPARISON:  None Available. FINDINGS: Lung bases are normal. No free air, portal venous gas, or pneumatosis. Dilated loops of small bowel  are identified primarily in the left side of the abdomen. There is a paucity of colonic gas. Probable right renal stones. No other significant abnormalities are identified. IMPRESSION: Small bowel obstruction. Recommend CT imaging for better evaluation. Right renal stone suspected. These results will be called to the ordering clinician or representative by the Radiologist Assistant, and communication documented in the PACS or [employer]. Electronically Signed   By: Dorise Bullion III M.D.   On: 08/08/2022 10:53   MR SHOULDER LEFT WO CONTRAST  Result Date: 07/29/2022 CLINICAL DATA:  Left shoulder pain, neck pain for 6-8 months EXAM: MRI OF THE LEFT SHOULDER WITHOUT CONTRAST TECHNIQUE: Multiplanar, multisequence MR imaging of the shoulder was performed. No intravenous contrast was administered. COMPARISON:  None Available. FINDINGS: Rotator cuff: Mild tendinosis of the supraspinatus tendon with a tiny partial-thickness articular surface tear along the anterior-most aspect. Mild tendinosis of the infraspinatus tendon with subcortical reactive marrow changes. Teres minor tendon is intact. Subscapularis tendon is intact. Muscles: No muscle atrophy or edema. No intramuscular fluid collection or hematoma. Biceps Long Head: Mild tendinosis of the intra-articular portion of the long head of the biceps tendon. Acromioclavicular Joint: Moderate arthropathy of the acromioclavicular joint. No subacromial/subdeltoid bursal fluid. Glenohumeral Joint: No joint effusion. High-grade partial-thickness cartilage loss of the glenohumeral joint with areas of full-thickness cartilage loss. Subchondral reactive marrow changes in the glenoid. Labrum: Grossly intact, but evaluation is limited by lack of intraarticular fluid/contrast. Bones: No fracture or dislocation. No aggressive osseous lesion. Other: No fluid collection or hematoma. IMPRESSION: 1. Mild tendinosis of the supraspinatus tendon with a tiny partial-thickness  articular surface tear along the anterior-most aspect. 2. Mild tendinosis of the infraspinatus tendon with subcortical reactive marrow changes. 3. Mild tendinosis of the intra-articular portion of the long head of the biceps tendon. 4. 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 Electronically Signed   By: Kathreen Devoid M.D.   On: 07/29/2022 06:22    Microbiology: Results for orders placed or performed during the hospital encounter of 08/08/22  Blood Culture (routine x 2)     Status: None (Preliminary result)   Collection Time: 08/08/22 12:13 PM   Specimen: Right Antecubital; Blood  Result Value Ref Range Status   Specimen Description   Final    RIGHT ANTECUBITAL BOTTLES DRAWN AEROBIC AND ANAEROBIC   Special Requests   Final    Blood Culture results may not be optimal due to an excessive volume of blood received in culture bottles   Culture   Final    NO GROWTH 2 DAYS Performed at [facility], [address] [place], [place] [address]    Report Status PENDING  Incomplete  Blood Culture (routine x 2)     Status: None (Preliminary result)   Collection Time: 08/08/22 12:18 PM   Specimen: BLOOD LEFT FOREARM  Result Value Ref Range Status   Specimen Description   Final    BLOOD LEFT FOREARM BOTTLES DRAWN AEROBIC AND ANAEROBIC   Special Requests Blood Culture adequate volume  Final   Culture   Final    NO GROWTH 2 DAYS Performed at [facility], [address], [place], [place] [address]    Report Status PENDING  Incomplete  C Difficile Quick Screen w PCR reflex     Status: None   Collection Time: 08/09/22 12:07 PM   Specimen: STOOL  Result Value Ref Range Status   C Diff antigen NEGATIVE NEGATIVE Final   C Diff toxin NEGATIVE NEGATIVE Final   C Diff interpretation No C. difficile detected.  Final    Comment: Performed at [facility], [address], [place], [place] [address]    Labs: CBC: Recent Labs  Lab 08/08/22 1213 08/09/22 0414  WBC 7.8 5.0  NEUTROABS  5.1  --   HGB 16.2 13.5  HCT 49.6 41.0  MCV 92.4 91.9  PLT 220 [id number]   Basic Metabolic Panel: Recent Labs  Lab 08/08/22 1213 08/09/22 0414 08/10/22 0415 08/11/22 0356  NA 131* 135 135 138  K 4.7 3.9 3.8 4.0  CL 100 106 108 110  CO2 18* 17* 18* 20*  GLUCOSE 139* 62* 191* 145*  BUN 79* 78* 40* 20  CREATININE 4.59* 2.74* 1.21 0.99  CALCIUM 9.2 8.2* 8.6* 8.9   Liver Function Tests: Recent Labs  Lab 08/08/22 1213  AST 34  ALT 37  ALKPHOS 58  BILITOT 0.5  PROT 8.1  ALBUMIN 4.2   CBG: Recent Labs  Lab 08/10/22 1122 08/10/22 1621 08/10/22 2343 08/11/22 0520 08/11/22 0714  GLUCAP 285* 201* 142* 179* 129*    Discharge time spent: greater than 30 minutes.  Signed: Deatra James, MD [facility] 08/11/2022

## 2022-08-11 NOTE — Progress Notes (Signed)
Patient rested well during this shift. He c/o a dry cough.

## 2022-08-13 LAB — CULTURE, BLOOD (ROUTINE X 2)
Culture: NO GROWTH
Culture: NO GROWTH
Special Requests: ADEQUATE

## 2022-08-14 DIAGNOSIS — M25512 Pain in left shoulder: Secondary | ICD-10-CM | POA: Diagnosis not present

## 2022-08-14 DIAGNOSIS — M542 Cervicalgia: Secondary | ICD-10-CM | POA: Diagnosis not present

## 2022-08-19 NOTE — Telephone Encounter (Signed)
done

## 2022-08-26 DIAGNOSIS — M542 Cervicalgia: Secondary | ICD-10-CM | POA: Diagnosis not present

## 2022-09-01 DIAGNOSIS — M542 Cervicalgia: Secondary | ICD-10-CM | POA: Diagnosis not present

## 2022-09-03 DIAGNOSIS — M5030 Other cervical disc degeneration, unspecified cervical region: Secondary | ICD-10-CM | POA: Diagnosis not present

## 2022-09-06 DIAGNOSIS — E1129 Type 2 diabetes mellitus with other diabetic kidney complication: Secondary | ICD-10-CM | POA: Diagnosis not present

## 2022-09-06 DIAGNOSIS — I10 Essential (primary) hypertension: Secondary | ICD-10-CM | POA: Diagnosis not present

## 2022-09-08 DIAGNOSIS — M5030 Other cervical disc degeneration, unspecified cervical region: Secondary | ICD-10-CM | POA: Diagnosis not present

## 2022-09-09 DIAGNOSIS — M542 Cervicalgia: Secondary | ICD-10-CM | POA: Diagnosis not present

## 2022-09-16 ENCOUNTER — Encounter: Payer: Self-pay | Admitting: Cardiovascular Disease

## 2022-09-16 DIAGNOSIS — I10 Essential (primary) hypertension: Secondary | ICD-10-CM

## 2022-09-16 MED ORDER — HYDROCHLOROTHIAZIDE 12.5 MG PO CAPS: 12.5000 mg | ORAL_CAPSULE | Freq: Every day | ORAL | 3 refills | Status: DC

## 2022-09-16 NOTE — Telephone Encounter (Signed)
Please start hydrochlorothiazide 12.5 mg once daily. Please send another log of his blood pressure in 2 weeks time. Please schedule for renal artery duplex ultrasound for possible renal artery stenosis.

## 2022-09-18 DIAGNOSIS — I10 Essential (primary) hypertension: Secondary | ICD-10-CM | POA: Diagnosis not present

## 2022-09-18 DIAGNOSIS — G894 Chronic pain syndrome: Secondary | ICD-10-CM | POA: Diagnosis not present

## 2022-09-18 DIAGNOSIS — M5412 Radiculopathy, cervical region: Secondary | ICD-10-CM | POA: Diagnosis not present

## 2022-09-18 DIAGNOSIS — L4050 Arthropathic psoriasis, unspecified: Secondary | ICD-10-CM | POA: Diagnosis not present

## 2022-09-25 ENCOUNTER — Other Ambulatory Visit: Payer: Self-pay

## 2022-09-25 ENCOUNTER — Encounter (HOSPITAL_COMMUNITY): Payer: Self-pay | Admitting: Otolaryngology

## 2022-09-25 ENCOUNTER — Other Ambulatory Visit: Payer: Self-pay | Admitting: Otolaryngology

## 2022-09-25 NOTE — Progress Notes (Signed)
PCP - Elfredia Nevins, MD Cardiologist - Thurmon Fair, MD  EKG - 08/08/2022 Chest x-ray - 08/08/2022 ECHO - 12/24/2009 Cardiac Cath - 04/14/2016  CPAP - Yes.  DM- Type II Fasting Blood Sugar:  130's-160's.  A1c 7.8 08/08/2022 Checks Blood Sugar:  once/day  Blood Thinner Instructions: Plavix.  Ask your MD for instructions.  Aspirin Instructions: Ask your MD for instructions  ERAS Protcol - N/A.  NPO COVID TEST- N/A.  Pt denies any symptoms  Anesthesia review: Yes, cardiac hx  -------------  SDW INSTRUCTIONS:  Your procedure is scheduled on Monday April 22. Please report to [facility] Main Entrance "A" at 0630 A.M., and check in at the Admitting office. Call this number if you have problems the morning of surgery: [phone]   Remember: Do not eat or drink after midnight the night before your surgery    Medications to take morning of surgery with a sip of water include: amLODipine (NORVASC)  fenofibrate  metoprolol tartrate (LOPRESSOR)  pantoprazole (PROTONIX)   IF NEEDED HYDROcodone-acetaminophen (NORCO)  Oxycodone HCl 10 MG TABS   WHAT DO I DO ABOUT MY DIABETES MEDICATION?  The day before the procedure take only the morning dose of glimepiride (AMARYL), do not take the evening dose.  Do not take oral diabetes medicines (pills) the morning of surgery including: glimepiride (AMARYL) and  metFORMIN (GLUCOPHAGE)     The day of surgery, do not take other diabetes injectables, including Byetta (exenatide), Bydureon (exenatide ER), Victoza (liraglutide), or Trulicity (dulaglutide).  If your CBG is greater than 220 mg/dL, you may take  of your sliding scale (correction) dose of insulin.   HOW TO MANAGE YOUR DIABETES BEFORE AND AFTER SURGERY  Why is it important to control my blood sugar before and after surgery? Improving blood sugar levels before and after surgery helps healing and can limit problems. A way of improving blood sugar control is eating a healthy diet  by:  Eating less sugar and carbohydrates  Increasing activity/exercise  Talking with your doctor about reaching your blood sugar goals High blood sugars (greater than 180 mg/dL) can raise your risk of infections and slow your recovery, so you will need to focus on controlling your diabetes during the weeks before surgery. Make sure that the doctor who takes care of your diabetes knows about your planned surgery including the date and location.  How do I manage my blood sugar before surgery? Check your blood sugar at least 4 times a day, starting 2 days before surgery, to make sure that the level is not too high or low.  Check your blood sugar the morning of your surgery when you wake up and every 2 hours until you get to the Short Stay unit.  If your blood sugar is less than 70 mg/dL, you will need to treat for low blood sugar: Do not take insulin. Treat a low blood sugar (less than 70 mg/dL) with  cup of clear juice (cranberry or apple), 4 glucose tablets, OR glucose gel. Recheck blood sugar in 15 minutes after treatment (to make sure it is greater than 70 mg/dL). If your blood sugar is not greater than 70 mg/dL on recheck, call [phone] for further instructions. Report your blood sugar to the short stay nurse when you get to Short Stay.  If you are admitted to the hospital after surgery: Your blood sugar will be checked by the staff and you will probably be given insulin after surgery (instead of oral diabetes medicines) to make sure  you have good blood sugar levels. The goal for blood sugar control after surgery is 80-180 mg/dL.    As of today, STOP taking any Aspirin (unless otherwise instructed by your surgeon), Aleve, Naproxen, Ibuprofen, Motrin, Advil, Goody's, BC's,diclofenac (VOLTAREN) 75 MG EC tablet all herbal medications, fish oil, and all vitamins.    The Morning of Surgery Do not wear jewelry, make-up or nail polish. Do not wear lotions, powders, or perfumes/colognes,  or deodorant Do not bring valuables to the hospital. [facility] is not responsible for any belongings or valuables.  If you are a smoker, DO NOT Smoke 24 hours prior to surgery  If you wear a CPAP at night please bring your mask the morning of surgery   Remember that you must have someone to transport you home after your surgery, and remain with you for 24 hours if you are discharged the same day.  Please bring cases for contacts, glasses, hearing aids, dentures or bridgework because it cannot be worn into surgery.   Patients discharged the day of surgery will not be allowed to drive home.   Please shower the NIGHT BEFORE/MORNING OF SURGERY (use antibacterial soap like DIAL soap if possible). Wear comfortable clothes the morning of surgery. Oral Hygiene is also important to reduce your risk of infection.  Remember - BRUSH YOUR TEETH THE MORNING OF SURGERY WITH YOUR REGULAR TOOTHPASTE  Patient denies shortness of breath, fever, cough and chest pain.

## 2022-09-27 DIAGNOSIS — G4733 Obstructive sleep apnea (adult) (pediatric): Secondary | ICD-10-CM | POA: Diagnosis not present

## 2022-09-28 ENCOUNTER — Encounter (HOSPITAL_COMMUNITY): Payer: Self-pay | Admitting: Otolaryngology

## 2022-09-28 ENCOUNTER — Ambulatory Visit (HOSPITAL_COMMUNITY): Payer: Medicare HMO | Admitting: Certified Registered Nurse Anesthetist

## 2022-09-28 ENCOUNTER — Encounter (HOSPITAL_COMMUNITY)
Admission: RE | Disposition: A | Discharge status: Home / Self Care | Payer: Self-pay | Source: Home / Self Care | Attending: Otolaryngology

## 2022-09-28 ENCOUNTER — Other Ambulatory Visit: Payer: Self-pay

## 2022-09-28 ENCOUNTER — Ambulatory Visit (HOSPITAL_COMMUNITY)
Admission: RE | Admit: 2022-09-28 | Discharge: 2022-09-28 | Disposition: A | Discharge status: Home / Self Care | Payer: Medicare HMO | Attending: Otolaryngology | Admitting: Otolaryngology

## 2022-09-28 ENCOUNTER — Ambulatory Visit (HOSPITAL_BASED_OUTPATIENT_CLINIC_OR_DEPARTMENT_OTHER): Payer: Medicare HMO | Admitting: Certified Registered Nurse Anesthetist

## 2022-09-28 DIAGNOSIS — I10 Essential (primary) hypertension: Secondary | ICD-10-CM | POA: Diagnosis not present

## 2022-09-28 DIAGNOSIS — Z6829 Body mass index (BMI) 29.0-29.9, adult: Secondary | ICD-10-CM | POA: Insufficient documentation

## 2022-09-28 DIAGNOSIS — I2510 Atherosclerotic heart disease of native coronary artery without angina pectoris: Secondary | ICD-10-CM | POA: Diagnosis not present

## 2022-09-28 DIAGNOSIS — G4730 Sleep apnea, unspecified: Secondary | ICD-10-CM | POA: Diagnosis not present

## 2022-09-28 DIAGNOSIS — E1151 Type 2 diabetes mellitus with diabetic peripheral angiopathy without gangrene: Secondary | ICD-10-CM

## 2022-09-28 DIAGNOSIS — Z951 Presence of aortocoronary bypass graft: Secondary | ICD-10-CM | POA: Insufficient documentation

## 2022-09-28 DIAGNOSIS — G4733 Obstructive sleep apnea (adult) (pediatric): Secondary | ICD-10-CM

## 2022-09-28 HISTORY — PX: DRUG INDUCED ENDOSCOPY: SHX6808

## 2022-09-28 HISTORY — DX: Unspecified osteoarthritis, unspecified site: M19.90

## 2022-09-28 HISTORY — DX: Sleep apnea, unspecified: G47.30

## 2022-09-28 LAB — CBC
HCT: 39.2 % (ref 39.0–52.0)
Hemoglobin: 13.1 g/dL (ref 13.0–17.0)
MCH: 30.8 pg (ref 26.0–34.0)
MCHC: 33.4 g/dL (ref 30.0–36.0)
MCV: 92.2 fL (ref 80.0–100.0)
Platelets: 167 10*3/uL (ref 150–400)
RBC: 4.25 MIL/uL (ref 4.22–5.81)
RDW: 13.7 % (ref 11.5–15.5)
WBC: 7.4 10*3/uL (ref 4.0–10.5)
nRBC: 0 % (ref 0.0–0.2)

## 2022-09-28 LAB — BASIC METABOLIC PANEL
Anion gap: 12 (ref 5–15)
BUN: 19 mg/dL (ref 8–23)
CO2: 20 mmol/L — ABNORMAL LOW (ref 22–32)
Calcium: 9.6 mg/dL (ref 8.9–10.3)
Chloride: 105 mmol/L (ref 98–111)
Creatinine, Ser: 1.17 mg/dL (ref 0.61–1.24)
GFR, Estimated: >60 mL/min (ref >60)
Glucose, Bld: 182 mg/dL — ABNORMAL HIGH (ref 70–99)
Potassium: 4.1 mmol/L (ref 3.5–5.1)
Sodium: 137 mmol/L (ref 135–145)

## 2022-09-28 LAB — GLUCOSE, CAPILLARY
Glucose-Capillary: 149 mg/dL — ABNORMAL HIGH (ref 70–99)
Glucose-Capillary: 160 mg/dL — ABNORMAL HIGH (ref 70–99)

## 2022-09-28 SURGERY — DRUG INDUCED SLEEP ENDOSCOPY
Anesthesia: General | Laterality: Bilateral

## 2022-09-28 MED ORDER — CHLORHEXIDINE GLUCONATE 0.12 % MT SOLN
15.0000 mL | Freq: Once | OROMUCOSAL | Status: AC
  Administered 2022-09-28: 15 mL via OROMUCOSAL
  Filled 2022-09-28: qty 15

## 2022-09-28 MED ORDER — FENTANYL CITRATE (PF) 250 MCG/5ML IJ SOLN
INTRAMUSCULAR | Status: AC
  Filled 2022-09-28: qty 5

## 2022-09-28 MED ORDER — ONDANSETRON HCL 4 MG/2ML IJ SOLN: 4.0000 mg | Freq: Once | INTRAMUSCULAR | Status: DC | PRN

## 2022-09-28 MED ORDER — MIDAZOLAM HCL 2 MG/2ML IJ SOLN
INTRAMUSCULAR | Status: AC
  Filled 2022-09-28: qty 2

## 2022-09-28 MED ORDER — INSULIN ASPART 100 UNIT/ML IJ SOLN: 0.0000 [IU] | INTRAMUSCULAR | Status: DC | PRN

## 2022-09-28 MED ORDER — ORAL CARE MOUTH RINSE: 15.0000 mL | Freq: Once | OROMUCOSAL | Status: AC

## 2022-09-28 MED ORDER — PROPOFOL 500 MG/50ML IV EMUL
INTRAVENOUS | Status: DC | PRN
  Administered 2022-09-28: 50 ug/kg/min via INTRAVENOUS

## 2022-09-28 MED ORDER — ACETAMINOPHEN 10 MG/ML IV SOLN
INTRAVENOUS | Status: AC
  Filled 2022-09-28: qty 100

## 2022-09-28 MED ORDER — ONDANSETRON HCL 4 MG/2ML IJ SOLN
INTRAMUSCULAR | Status: DC | PRN
  Administered 2022-09-28: 4 mg via INTRAVENOUS

## 2022-09-28 MED ORDER — OXYMETAZOLINE HCL 0.05 % NA SOLN
NASAL | Status: DC | PRN
  Administered 2022-09-28: 1

## 2022-09-28 MED ORDER — ACETAMINOPHEN 10 MG/ML IV SOLN
1000.0000 mg | Freq: Once | INTRAVENOUS | Status: DC | PRN
  Administered 2022-09-28: 1000 mg via INTRAVENOUS

## 2022-09-28 MED ORDER — PROPOFOL 1000 MG/100ML IV EMUL
INTRAVENOUS | Status: AC
  Filled 2022-09-28: qty 100

## 2022-09-28 MED ORDER — LACTATED RINGERS IV SOLN: INTRAVENOUS | Status: DC

## 2022-09-28 MED ORDER — LIDOCAINE-EPINEPHRINE 1 %-1:100000 IJ SOLN
INTRAMUSCULAR | Status: AC
  Filled 2022-09-28: qty 1

## 2022-09-28 MED ORDER — FENTANYL CITRATE (PF) 100 MCG/2ML IJ SOLN: 25.0000 ug | INTRAMUSCULAR | Status: DC | PRN

## 2022-09-28 SURGICAL SUPPLY — 13 items
BAG COUNTER SPONGE SURGICOUNT (BAG) IMPLANT
BAG SPNG CNTER NS LX DISP (BAG)
CANISTER SUCT 1200ML W/VALVE (MISCELLANEOUS) ×1 IMPLANT
GLOVE BIO SURGEON STRL SZ7.5 (GLOVE) ×1 IMPLANT
KIT BASIN OR (CUSTOM PROCEDURE TRAY) ×1 IMPLANT
NDL PRECISIONGLIDE 27X1.5 (NEEDLE) IMPLANT
NEEDLE PRECISIONGLIDE 27X1.5 (NEEDLE) IMPLANT
PATTIES SURGICAL .5 X3 (DISPOSABLE) ×1 IMPLANT
SHEET MEDIUM DRAPE 40X70 STRL (DRAPES) IMPLANT
SOL ANTI FOG 6CC (MISCELLANEOUS) ×1 IMPLANT
SYR CONTROL 10ML LL (SYRINGE) IMPLANT
TOWEL GREEN STERILE FF (TOWEL DISPOSABLE) ×1 IMPLANT
TUBE CONNECTING 20X1/4 (TUBING) ×1 IMPLANT

## 2022-09-28 NOTE — Anesthesia Preprocedure Evaluation (Signed)
Anesthesia Evaluation  Patient identified by MRN, date of birth, ID band Patient awake    Reviewed: Allergy & Precautions, H&P , NPO status , Patient's Chart, lab work & pertinent test results  Airway Mallampati: III  TM Distance: <3 FB Neck ROM: Full    Dental no notable dental hx.    Pulmonary sleep apnea    Pulmonary exam normal breath sounds clear to auscultation       Cardiovascular hypertension, + CAD, + CABG and + Peripheral Vascular Disease  Normal cardiovascular exam Rhythm:Regular Rate:Normal     Neuro/Psych negative neurological ROS  negative psych ROS   GI/Hepatic negative GI ROS, Neg liver ROS,,,  Endo/Other  diabetes, Type 2    Renal/GU negative Renal ROS  negative genitourinary   Musculoskeletal negative musculoskeletal ROS (+)    Abdominal   Peds negative pediatric ROS (+)  Hematology negative hematology ROS (+)   Anesthesia Other Findings   Reproductive/Obstetrics negative OB ROS                             Anesthesia Physical Anesthesia Plan  ASA: 3  Anesthesia Plan: General   Post-op Pain Management: Minimal or no pain anticipated   Induction: Intravenous  PONV Risk Score and Plan: 2 and Ondansetron and Treatment may vary due to age or medical condition  Airway Management Planned: Mask  Additional Equipment:   Intra-op Plan:   Post-operative Plan:   Informed Consent: I have reviewed the patients History and Physical, chart, labs and discussed the procedure including the risks, benefits and alternatives for the proposed anesthesia with the patient or authorized representative who has indicated his/her understanding and acceptance.     Dental advisory given  Plan Discussed with: CRNA and Surgeon  Anesthesia Plan Comments:        Anesthesia Quick Evaluation

## 2022-09-28 NOTE — Brief Op Note (Signed)
09/28/2022  8:54 AM  PATIENT:  Jonathan Shepard  72 y.o. male  PRE-OPERATIVE DIAGNOSIS:  BMI 30.0-30.9,adult  Obstructive Sleep Apnea  POST-OPERATIVE DIAGNOSIS:  BMI 30.0-30.9,adult Obstructive Sleep Apnea  PROCEDURE:  Procedure(s): DRUG INDUCED ENDOSCOPY (Bilateral)  SURGEON:  Surgeon(s) and Role:    Christia Reading, MD - Primary  PHYSICIAN ASSISTANT:   ASSISTANTS: none   ANESTHESIA:   IV sedation  EBL:  None   BLOOD ADMINISTERED:none  DRAINS: none   LOCAL MEDICATIONS USED:  NONE  SPECIMEN:  No Specimen  DISPOSITION OF SPECIMEN:  N/A  COUNTS:  YES  TOURNIQUET:  * No tourniquets in log *  DICTATION: .Note written in EPIC  PLAN OF CARE: Discharge to home after PACU  PATIENT DISPOSITION:  PACU - hemodynamically stable.   Delay start of Pharmacological VTE agent (>24hrs) due to surgical blood loss or risk of bleeding: no

## 2022-09-28 NOTE — Transfer of Care (Signed)
Immediate Anesthesia Transfer of Care Note  Patient: Jonathan Shepard  Procedure(s) Performed: DRUG INDUCED ENDOSCOPY (Bilateral)  Patient Location: PACU  Anesthesia Type:MAC  Level of Consciousness: awake, alert , oriented, patient cooperative, and responds to stimulation  Airway & Oxygen Therapy: Patient Spontanous Breathing  Post-op Assessment: Report given to RN and Post -op Vital signs reviewed and stable  Post vital signs: Reviewed and stable  Last Vitals:  Vitals Value Taken Time  BP 132/81 09/28/22 0853  Temp    Pulse 73 09/28/22 0855  Resp 18 09/28/22 0855  SpO2 93 % 09/28/22 0855  Vitals shown include unvalidated device data.  Last Pain:  Vitals:   09/28/22 0711  TempSrc:   PainSc: 0-No pain         Complications: No notable events documented.

## 2022-09-28 NOTE — Op Note (Signed)
Preop diagnosis: Obstructive sleep apnea Postop diagnosis: same Procedure: Drug-induced sleep endoscopy Surgeon: Calvina Liptak Anesth: IV sedation Compl: None Findings: There is 50% anterior-posterior and 50% lateral wall collapse at the velum making him a candidate for the hypoglossal nerve stimulator.  There was also anterior-posterior collapse at the tongue base. Description:  After discussing risks, benefits, and alternatives, the patient was brought to the operative suite and placed on the operative table in the supine position.  Anesthesia was induced and the patient was given light sedation to simulate natural sleep. When the proper level was reached, an Afrin-soaked pledget was placed in the right nasal passage for a couple of minutes and then removed.  The fiberoptic laryngoscope was then passed to view the pharynx and larynx.  Findings are noted above and the exam was recorded.  After completion, the scope was removed and the patient was returned to anesthesia for wakeup and was moved to the recovery room in stable condition.  

## 2022-09-28 NOTE — Anesthesia Postprocedure Evaluation (Signed)
Anesthesia Post Note  Patient: Jonathan Shepard  Procedure(s) Performed: DRUG INDUCED ENDOSCOPY (Bilateral)     Patient location during evaluation: PACU Anesthesia Type: General Level of consciousness: awake and alert Pain management: pain level controlled Vital Signs Assessment: post-procedure vital signs reviewed and stable Respiratory status: spontaneous breathing, nonlabored ventilation, respiratory function stable and patient connected to nasal cannula oxygen Cardiovascular status: blood pressure returned to baseline and stable Postop Assessment: no apparent nausea or vomiting Anesthetic complications: no  No notable events documented.  Last Vitals:  Vitals:   09/28/22 0656  BP: (!) 152/78  Pulse: 68  Resp: 17  Temp: 37.2 C  SpO2: 97%    Last Pain:  Vitals:   09/28/22 0711  TempSrc:   PainSc: 0-No pain                 Turki Tapanes S

## 2022-09-28 NOTE — H&P (Signed)
Jonathan Shepard is an 72 y.o. male.   Chief Complaint: Sleep apnea HPI: 72 year old male with sleep apnea who has been unable to tolerate CPAP.  Past Medical History:  Diagnosis Date   Arthritis    CAD (coronary artery disease)    Diabetes    Dyspnea    GERD (gastroesophageal reflux disease)    Hyperlipidemia    Hypertension    Joint pain    hand, related to work   Obesity    Sleep apnea     Past Surgical History:  Procedure Laterality Date   CARDIAC CATHETERIZATION N/A 04/14/2016   Procedure: Left Heart Cath and Coronary Angiography;  Surgeon: Peter M Swaziland, MD;  Location: [facility] INVASIVE CV LAB;  Service: Cardiovascular;  Laterality: N/A;   COLONOSCOPY WITH PROPOFOL N/A 01/09/2019   Procedure: COLONOSCOPY WITH PROPOFOL;  Surgeon: Corbin Ade, MD;  Location: [facility] ENDO SUITE;  Service: Endoscopy;  Laterality: N/A;  8:30am   CORONARY ARTERY BYPASS GRAFT N/A 04/22/2016   Procedure: CORONARY ARTERY BYPASS GRAFTING (CABG)x4 with endoscopic harvesting of right saphenous vein SVG to diagonal SVG to PDA Left Radial artery to OM 2 LIMA to LAD;  Surgeon: Loreli Slot, MD;  Location: [facility] OR;  Service: Open Heart Surgery;  Laterality: N/A;   RADIAL ARTERY HARVEST Left 04/22/2016   Procedure: LEFT RADIAL ARTERY HARVEST;  Surgeon: Loreli Slot, MD;  Location: [facility] OR;  Service: Open Heart Surgery;  Laterality: Left;   TEE WITHOUT CARDIOVERSION N/A 04/22/2016   Procedure: TRANSESOPHAGEAL ECHOCARDIOGRAM (TEE);  Surgeon: Loreli Slot, MD;  Location: [facility] OR;  Service: Open Heart Surgery;  Laterality: N/A;    Family History  Problem Relation Age of Onset   Coronary artery disease Father 30       CABG   Cancer Father    Coronary artery disease Brother 6       CABG   Colon cancer Neg Hx    Colon polyps Neg Hx    Social History:  reports that he has never smoked. He has never used smokeless tobacco. He reports that he does not drink alcohol and does not use  drugs.  Allergies: No Known Allergies  Medications Prior to Admission  Medication Sig Dispense Refill   amLODipine (NORVASC) 5 MG tablet Take 5 mg by mouth 2 (two) times daily.     aspirin EC 81 MG tablet Take 81 mg by mouth daily. Swallow whole.     clopidogrel (PLAVIX) 75 MG tablet Take 1 tablet (75 mg total) by mouth daily. 90 tablet 0   Coenzyme Q10 (COQ-10) 100 MG CAPS Take 100 mg by mouth daily.     diclofenac (VOLTAREN) 75 MG EC tablet Take 75 mg by mouth 2 (two) times daily.     fenofibrate 160 MG tablet Take 160 mg by mouth daily.     Flaxseed, Linseed, (FLAX SEEDS PO) Take 1-2 tablets by mouth See admin instructions. Take 2 tablets in the morning and 1 tablet in the evening     glimepiride (AMARYL) 4 MG tablet Take 4 mg by mouth 2 (two) times daily.     HYDROcodone-acetaminophen (NORCO) 10-325 MG tablet Take 1 tablet by mouth every 6 (six) hours as needed for moderate pain.  0   metFORMIN (GLUCOPHAGE) 1000 MG tablet Take 1,000 mg by mouth 2 (two) times daily with a meal.     metoprolol tartrate (LOPRESSOR) 25 MG tablet Take 25 mg by mouth 2 (two) times daily.  Multiple Vitamin (MULTIVITAMIN WITH MINERALS) TABS tablet Take 1 tablet by mouth daily.     pantoprazole (PROTONIX) 40 MG tablet TAKE 1 TABLET EVERY DAY 90 tablet 1   pravastatin (PRAVACHOL) 80 MG tablet TAKE 1 TABLET EVERY DAY (Patient taking differently: Take 80 mg by mouth at bedtime.) 90 tablet 3   ramipril (ALTACE) 10 MG capsule Take 1 capsule (10 mg total) by mouth daily. 90 capsule 0   sildenafil (VIAGRA) 100 MG tablet Take 100 mg by mouth daily as needed for erectile dysfunction.     amLODipine (NORVASC) 10 MG tablet Take 1 tablet (10 mg total) by mouth daily. 180 tablet 1   hydrochlorothiazide (MICROZIDE) 12.5 MG capsule Take 1 capsule (12.5 mg total) by mouth daily. 90 capsule 3   Oxycodone HCl 10 MG TABS Take 10 mg by mouth daily as needed (breakthrough pain).      Results for orders placed or performed during  the hospital encounter of 09/28/22 (from the past 48 hour(s))  Basic metabolic panel per protocol     Status: Abnormal   Collection Time: 09/28/22  6:51 AM  Result Value Ref Range   Sodium 137 135 - 145 mmol/L   Potassium 4.1 3.5 - 5.1 mmol/L   Chloride 105 98 - 111 mmol/L   CO2 20 (L) 22 - 32 mmol/L   Glucose, Bld 182 (H) 70 - 99 mg/dL    Comment: Glucose reference range applies only to samples taken after fasting for at least 8 hours.   BUN 19 8 - 23 mg/dL   Creatinine, Ser 1.61 0.61 - 1.24 mg/dL   Calcium 9.6 8.9 - 09.6 mg/dL   GFR, Estimated >04 >54 mL/min    Comment: (NOTE) Calculated using the CKD-EPI Creatinine Equation (2021)    Anion gap 12 5 - 15    Comment: Performed at [facility], [address] [place], [place] [address]  CBC per protocol     Status: None   Collection Time: 09/28/22  6:51 AM  Result Value Ref Range   WBC 7.4 4.0 - 10.5 K/uL   RBC 4.25 4.22 - 5.81 MIL/uL   Hemoglobin 13.1 13.0 - 17.0 g/dL   HCT 91.4 78.2 - 95.6 %   MCV 92.2 80.0 - 100.0 fL   MCH 30.8 26.0 - 34.0 pg   MCHC 33.4 30.0 - 36.0 g/dL   RDW 21.3 08.6 - 57.8 %   Platelets 167 150 - 400 K/uL   nRBC 0.0 0.0 - 0.2 %    Comment: Performed at [facility], [address], [place], [place] [address]  Glucose, capillary     Status: Abnormal   Collection Time: 09/28/22  7:00 AM  Result Value Ref Range   Glucose-Capillary 149 (H) 70 - 99 mg/dL    Comment: Glucose reference range applies only to samples taken after fasting for at least 8 hours.   No results found.  Review of Systems  All other systems reviewed and are negative.   Blood pressure (!) 152/78, pulse 68, temperature 98.9 F (37.2 C), temperature source Oral, resp. rate 17, height 6' (1.829 m), weight 97.5 kg, SpO2 97 %. Physical Exam Constitutional:      Appearance: Normal appearance. He is normal weight.  HENT:     Head: Normocephalic and atraumatic.     Right Ear: External ear normal.     Left Ear: External  ear normal.     Nose: Nose normal.  Mouth/Throat:     Mouth: Mucous membranes are moist.     Pharynx: Oropharynx is clear.  Eyes:     Extraocular Movements: Extraocular movements intact.     Conjunctiva/sclera: Conjunctivae normal.     Pupils: Pupils are equal, round, and reactive to light.  Cardiovascular:     Rate and Rhythm: Normal rate.  Pulmonary:     Effort: Pulmonary effort is normal.  Musculoskeletal:     Cervical back: Normal range of motion.  Skin:    General: Skin is warm and dry.  Neurological:     General: No focal deficit present.     Mental Status: He is alert and oriented to person, place, and time.  Psychiatric:        Mood and Affect: Mood normal.        Behavior: Behavior normal.        Thought Content: Thought content normal.        Judgment: Judgment normal.      Assessment/Plan Obstructive sleep apnea, BMI 29.16  To OR for sleep endoscopy.  Christia Reading, MD 09/28/2022, 8:27 AM

## 2022-09-29 ENCOUNTER — Encounter (HOSPITAL_COMMUNITY): Payer: Self-pay | Admitting: Otolaryngology

## 2022-10-06 DIAGNOSIS — I10 Essential (primary) hypertension: Secondary | ICD-10-CM | POA: Diagnosis not present

## 2022-10-06 DIAGNOSIS — E1129 Type 2 diabetes mellitus with other diabetic kidney complication: Secondary | ICD-10-CM | POA: Diagnosis not present

## 2022-10-07 DIAGNOSIS — M542 Cervicalgia: Secondary | ICD-10-CM | POA: Diagnosis not present

## 2022-10-08 ENCOUNTER — Other Ambulatory Visit (HOSPITAL_COMMUNITY): Payer: Self-pay | Admitting: Otolaryngology

## 2022-10-08 DIAGNOSIS — M542 Cervicalgia: Secondary | ICD-10-CM

## 2022-10-20 ENCOUNTER — Other Ambulatory Visit: Payer: Self-pay | Admitting: Otolaryngology

## 2022-10-26 ENCOUNTER — Telehealth: Payer: Self-pay | Admitting: Cardiovascular Disease

## 2022-10-26 ENCOUNTER — Telehealth: Payer: Self-pay

## 2022-10-26 NOTE — Telephone Encounter (Signed)
Called patient and he is scheduled to have a telephone visit on Thursday 10/29/22 at 2:20 PM. Consent given and medications reviewed.

## 2022-10-26 NOTE — Telephone Encounter (Signed)
  Patient Consent for Virtual Visit        Jonathan Shepard has provided verbal consent on 10/26/2022 for a virtual visit (video or telephone).   CONSENT FOR VIRTUAL VISIT FOR:  Jonathan Shepard  By participating in this virtual visit I agree to the following:  I hereby voluntarily request, consent and authorize [facility] and its employed or contracted physicians, physician assistants, nurse practitioners or other licensed health care professionals (the Practitioner), to provide me with telemedicine health care services (the "Services") as deemed necessary by the treating Practitioner. I acknowledge and consent to receive the Services by the Practitioner via telemedicine. I understand that the telemedicine visit will involve communicating with the Practitioner through live audiovisual communication technology and the disclosure of certain medical information by electronic transmission. I acknowledge that I have been given the opportunity to request an in-person assessment or other available alternative prior to the telemedicine visit and am voluntarily participating in the telemedicine visit.  I understand that I have the right to withhold or withdraw my consent to the use of telemedicine in the course of my care at any time, without affecting my right to future care or treatment, and that the Practitioner or I may terminate the telemedicine visit at any time. I understand that I have the right to inspect all information obtained and/or recorded in the course of the telemedicine visit and may receive copies of available information for a reasonable fee.  I understand that some of the potential risks of receiving the Services via telemedicine include:  Delay or interruption in medical evaluation due to technological equipment failure or disruption; Information transmitted may not be sufficient (e.g. poor resolution of images) to allow for appropriate medical decision making by the  Practitioner; and/or  In rare instances, security protocols could fail, causing a breach of personal health information.  Furthermore, I acknowledge that it is my responsibility to provide information about my medical history, conditions and care that is complete and accurate to the best of my ability. I acknowledge that Practitioner's advice, recommendations, and/or decision may be based on factors not within their control, such as incomplete or inaccurate data provided by me or distortions of diagnostic images or specimens that may result from electronic transmissions. I understand that the practice of medicine is not an exact science and that Practitioner makes no warranties or guarantees regarding treatment outcomes. I acknowledge that a copy of this consent can be made available to me via my patient portal [facility] MyChart), or I can request a printed copy by calling the office of [facility].    I understand that my insurance will be billed for this visit.   I have read or had this consent read to me. I understand the contents of this consent, which adequately explains the benefits and risks of the Services being provided via telemedicine.  I have been provided ample opportunity to ask questions regarding this consent and the Services and have had my questions answered to my satisfaction. I give my informed consent for the services to be provided through the use of telemedicine in my medical care

## 2022-10-26 NOTE — Telephone Encounter (Signed)
   Pre-operative Risk Assessment    Patient Name: Jonathan Shepard  DOB: 26-Oct-1950 MRN: [id number]      Request for Surgical Clearance    Procedure:  IMPLANTATION OF HYPOGLOSSAL NERVE STIMULATOR   Date of Surgery:  Clearance 11/17/22                                 Surgeon:  Dr. Christia Reading  Surgeon's Group or Practice Name:  [facility]  Phone number:  [phone] Fax number:  [phone]   Type of Clearance Requested:   - Medical  - Pharmacy:  Hold Clopidogrel (Plavix) Need to know how long to hold    Type of Anesthesia:  General    Additional requests/questions:  Please advise surgeon/provider what medications should be held.  Signed, April Henson   10/26/2022, 10:04 AM

## 2022-10-26 NOTE — Telephone Encounter (Signed)
   Name: Jonathan Shepard  DOB: 1950-07-30  MRN: [id number]  Primary Cardiologist: Thurmon Fair, MD   Preoperative team, please contact this patient and set up a phone call appointment for further preoperative risk assessment. Please obtain consent and complete medication review. Thank you for your help.  I confirm that guidance regarding antiplatelet and oral anticoagulation therapy has been completed and, if necessary, noted below.  Per office protocol, if patient is without any new symptoms or concerns at the time of their virtual visit, he may hold Plavix for 5 days prior to procedure. Please resume Plavix as soon as possible postprocedure, at the discretion of the surgeon.     Joylene Grapes, NP 10/26/2022, 11:18 AM [facility]

## 2022-10-28 NOTE — Progress Notes (Unsigned)
Virtual Visit via Telephone Note   Because of Jonathan Shepard's co-morbid illnesses, he is at least at moderate risk for complications without adequate follow up.  This format is felt to be most appropriate for this patient at this time.  The patient did not have access to video technology/had technical difficulties with video requiring transitioning to audio format only (telephone).  All issues noted in this document were discussed and addressed.  No physical exam could be performed with this format.  Please refer to the patient's chart for his consent to telehealth for [facility].  Evaluation Performed:  Preoperative cardiovascular risk assessment _____________   Date:  10/28/2022   Patient ID:  Jonathan Shepard, DOB December 11, 1950, MRN [id number] Patient Location:  Home Provider location:   Office  Primary Care Provider:  Elfredia Nevins, MD Primary Cardiologist:  Jonathan Fair, MD  Chief Complaint / Patient Profile   72 y.o. y/o male with a h/o CAD s/p RCA and LAD stents in 2000 07/2006, CABG 2017, HLD, HTN, obesity who is pending hypoglossal nerve stimulator and presents today for telephonic preoperative cardiovascular risk assessment.  History of Present Illness    Jonathan Shepard is a 72 y.o. male who presents via audio/video conferencing for a telehealth visit today.  Pt was last seen in cardiology clinic on 08/05/2022 by Jonathan Shepard.  At that time Jonathan Shepard was doing well with no new cardiac complaints..  The patient is now pending procedure as outlined above. Since his last visit, he ***  *** denies chest pain, shortness of breath, lower extremity edema, fatigue, palpitations, melena, hematuria, hemoptysis, diaphoresis, weakness, presyncope, syncope, orthopnea, and PND.    Per office protocol, if patient is without any new symptoms or concerns at the time of their virtual visit, he may hold Plavix for 5 days prior to procedure.    Past Medical History     Past Medical History:  Diagnosis Date   Arthritis    CAD (coronary artery disease)    Diabetes (HCC)    Dyspnea    GERD (gastroesophageal reflux disease)    Hyperlipidemia    Hypertension    Joint pain    hand, related to work   Obesity    Sleep apnea    Past Surgical History:  Procedure Laterality Date   CARDIAC CATHETERIZATION N/A 04/14/2016   Procedure: Left Heart Cath and Coronary Angiography;  Surgeon: Jonathan M Swaziland, MD;  Location: [facility] INVASIVE CV LAB;  Service: Cardiovascular;  Laterality: N/A;   COLONOSCOPY WITH PROPOFOL N/A 01/09/2019   Procedure: COLONOSCOPY WITH PROPOFOL;  Surgeon: Jonathan Ade, MD;  Location: [facility] ENDO SUITE;  Service: Endoscopy;  Laterality: N/A;  8:30am   CORONARY ARTERY BYPASS GRAFT N/A 04/22/2016   Procedure: CORONARY ARTERY BYPASS GRAFTING (CABG)x4 with endoscopic harvesting of right saphenous vein SVG to diagonal SVG to PDA Left Radial artery to OM 2 LIMA to LAD;  Surgeon: Jonathan Slot, MD;  Location: [facility] OR;  Service: Open Heart Surgery;  Laterality: N/A;   DRUG INDUCED ENDOSCOPY Bilateral 09/28/2022   Procedure: DRUG INDUCED ENDOSCOPY;  Surgeon: Jonathan Reading, MD;  Location: [facility] OR;  Service: ENT;  Laterality: Bilateral;   RADIAL ARTERY HARVEST Left 04/22/2016   Procedure: LEFT RADIAL ARTERY HARVEST;  Surgeon: Jonathan Slot, MD;  Location: [facility] OR;  Service: Open Heart Surgery;  Laterality: Left;   TEE WITHOUT CARDIOVERSION N/A 04/22/2016   Procedure: TRANSESOPHAGEAL ECHOCARDIOGRAM (TEE);  Surgeon: Jonathan Slot, MD;  Location: [facility] OR;  Service: Open Heart Surgery;  Laterality: N/A;    Allergies  No Known Allergies  Home Medications    Prior to Admission medications   Medication Sig Start Date End Date Taking? Authorizing Provider  amLODipine (NORVASC) 10 MG tablet Take 1 tablet (10 mg total) by mouth daily. 08/03/22 07/29/23  Shepard, Mihai, MD  amLODipine (NORVASC) 5 MG tablet Take 5 mg by mouth 2 (two) times daily.     [provider]  aspirin EC 81 MG tablet Take 81 mg by mouth daily. Swallow whole.    [provider]  clopidogrel (PLAVIX) 75 MG tablet Take 1 tablet (75 mg total) by mouth daily. 05/10/19   Shepard, Mihai, MD  Coenzyme Q10 (COQ-10) 100 MG CAPS Take 100 mg by mouth daily.    [provider]  diclofenac (VOLTAREN) 75 MG EC tablet Take 75 mg by mouth 2 (two) times daily. 09/24/22   [provider]  fenofibrate 160 MG tablet Take 160 mg by mouth daily.    [provider]  Flaxseed, Linseed, (FLAX SEEDS PO) Take 1-2 tablets by mouth See admin instructions. Take 2 tablets in the morning and 1 tablet in the evening    [provider]  glimepiride (AMARYL) 4 MG tablet Take 4 mg by mouth 2 (two) times daily.    [provider]  hydrochlorothiazide (MICROZIDE) 12.5 MG capsule Take 1 capsule (12.5 mg total) by mouth daily. 09/16/22 09/16/23  Shepard, Mihai, MD  HYDROcodone-acetaminophen (NORCO) 10-325 MG tablet Take 1 tablet by mouth every 6 (six) hours as needed for moderate pain. 02/22/18   [provider]  metFORMIN (GLUCOPHAGE) 1000 MG tablet Take 1,000 mg by mouth 2 (two) times daily with a meal.    [provider]  metoprolol tartrate (LOPRESSOR) 25 MG tablet Take 25 mg by mouth 2 (two) times daily.    [provider]  Multiple Vitamin (MULTIVITAMIN WITH MINERALS) TABS tablet Take 1 tablet by mouth daily.    [provider]  Oxycodone HCl 10 MG TABS Take 10 mg by mouth daily as needed (breakthrough pain). 07/08/18   [provider]  pantoprazole (PROTONIX) 40 MG tablet TAKE 1 TABLET EVERY DAY 05/10/18   Kilroy, Eda Paschal, PA-C  pravastatin (PRAVACHOL) 80 MG tablet TAKE 1 TABLET EVERY DAY Patient taking differently: Take 80 mg by mouth at bedtime. 06/09/22   Shepard, Mihai, MD  ramipril (ALTACE) 10 MG capsule Take 1 capsule (10 mg total) by mouth daily. 07/30/22   Shepard, Mihai, MD  sildenafil (VIAGRA)  100 MG tablet Take 100 mg by mouth daily as needed for erectile dysfunction. 09/18/22   [provider]    Physical Exam    Vital Signs:  Jonathan Shepard does not have vital signs available for review today.***  Given telephonic nature of communication, physical exam is limited. AAOx3. NAD. Normal affect.  Speech and respirations are unlabored.  Accessory Clinical Findings    None  Assessment & Plan    1.  Preoperative Cardiovascular Risk Assessment: -Patient's RCRI score is 0.9%  The patient was advised that if he develops new symptoms prior to surgery to contact our office to arrange for a follow-up visit, and he verbalized understanding.  (Reminder: Include SBE prophylaxis/Antiplatelet/Anticoag Instructions***)  A copy of this note will be routed to requesting surgeon.  Time:   Today, I have spent *** minutes with the patient with telehealth technology discussing medical history, symptoms, and management plan.  Napoleon Form, Leodis Rains, NP  10/28/2022, 3:12 PM

## 2022-10-29 ENCOUNTER — Ambulatory Visit: Payer: Medicare HMO | Attending: Interventional Cardiology

## 2022-10-29 DIAGNOSIS — Z01810 Encounter for preprocedural cardiovascular examination: Secondary | ICD-10-CM

## 2022-11-04 ENCOUNTER — Ambulatory Visit (HOSPITAL_COMMUNITY)
Admission: RE | Admit: 2022-11-04 | Discharge: 2022-11-04 | Disposition: A | Discharge status: Home / Self Care | Payer: Medicare HMO | Source: Ambulatory Visit | Attending: Otolaryngology | Admitting: Otolaryngology

## 2022-11-04 DIAGNOSIS — M47812 Spondylosis without myelopathy or radiculopathy, cervical region: Secondary | ICD-10-CM | POA: Diagnosis not present

## 2022-11-04 DIAGNOSIS — M542 Cervicalgia: Secondary | ICD-10-CM | POA: Insufficient documentation

## 2022-11-06 NOTE — Progress Notes (Addendum)
Surgical Instructions    Your procedure is scheduled on Tuesday November 17, 2022.  Report to [facility] Main Entrance "A" at 5:30 A.M., then check in with the Admitting office.  Call this number if you have problems the morning of surgery:  [phone]   If you have any questions prior to your surgery date call [phone]: Open Monday-Friday 8am-4pm If you experience any cold or flu symptoms such as cough, fever, chills, shortness of breath, etc. between now and your scheduled surgery, please notify us at the above number     Remember:  Do not eat after midnight the night before your surgery  You may drink clear liquids until 4:30 the morning of your surgery.   Clear liquids allowed are: Water, Non-Citrus Juices (without pulp), Carbonated Beverages, Clear Tea, Black Coffee ONLY (NO MILK, CREAM OR POWDERED CREAMER of any kind), and Gatorade    Take these medicines the morning of surgery with A SIP OF WATER:  amLODipine (NORVASC)  metoprolol tartrate (LOPRESSOR)  pantoprazole (PROTONIX)  fenofibrate  pantoprazole (PROTONIX)   If Needed:  HYDROcodone-acetaminophen (NORCO)  Oxycodone   Follow your surgeon's instructions on when to stop Aspirin. If no instructions were given by your surgeon then you will need to call the office to get those instructions. Per instructions from your [facility] provider you can hold Plavix 5 days before surgery. Last dose of Plavix will be 11/12/22.    As of today, STOP taking any Aleve, Naproxen, Ibuprofen, Motrin, Advil, Goody's, BC's, all herbal medications, fish oil,all vitamins, and diclofenac (VOLTAREN).   WHAT DO I DO ABOUT MY DIABETES MEDICATION?   Do not take oral diabetes medicines (pills) the morning of surgery. DO NOT TAKE glimepiride (AMARYL) OR metFORMIN (GLUCOPHAGE) THE MORNING OF SURGERY.   HOW TO MANAGE YOUR DIABETES BEFORE AND AFTER SURGERY  Why is it important to control my blood sugar before and after  surgery? Improving blood sugar levels before and after surgery helps healing and can limit problems. A way of improving blood sugar control is eating a healthy diet by:  Eating less sugar and carbohydrates  Increasing activity/exercise  Talking with your doctor about reaching your blood sugar goals High blood sugars (greater than 180 mg/dL) can raise your risk of infections and slow your recovery, so you will need to focus on controlling your diabetes during the weeks before surgery. Make sure that the doctor who takes care of your diabetes knows about your planned surgery including the date and location.  How do I manage my blood sugar before surgery? Check your blood sugar at least 4 times a day, starting 2 days before surgery, to make sure that the level is not too high or low.  Check your blood sugar the morning of your surgery when you wake up and every 2 hours until you get to the Short Stay unit.  If your blood sugar is less than 70 mg/dL, you will need to treat for low blood sugar: Do not take insulin. Treat a low blood sugar (less than 70 mg/dL) with  cup of clear juice (cranberry or apple), 4 glucose tablets, OR glucose gel. Recheck blood sugar in 15 minutes after treatment (to make sure it is greater than 70 mg/dL). If your blood sugar is not greater than 70 mg/dL on recheck, call [phone] for further instructions. Report your blood sugar to the short stay nurse when you get to Short Stay.  If you are admitted to the hospital after surgery:  Your blood sugar will be checked by the staff and you will probably be given insulin after surgery (instead of oral diabetes medicines) to make sure you have good blood sugar levels. The goal for blood sugar control after surgery is 80-180 mg/dL.         Do not wear jewelry or makeup. Do not wear lotions, powders, perfumes/cologne or deodorant. Do not shave 48 hours prior to surgery.  Men may shave face and neck. Do not bring valuables to  the hospital. Do not wear nail polish, gel polish, artificial nails, or any other type of covering on natural nails (fingers and toes) If you have artificial nails or gel coating that need to be removed by a nail salon, please have this removed prior to surgery. Artificial nails or gel coating may interfere with anesthesia's ability to adequately monitor your vital signs.  [facility] is not responsible for any belongings or valuables.    Do NOT Smoke (Tobacco/Vaping)  24 hours prior to your procedure  If you use a CPAP at night, you may bring your mask for your overnight stay.   Contacts, glasses, hearing aids, dentures or partials may not be worn into surgery, please bring cases for these belongings   For patients admitted to the hospital, discharge time will be determined by your treatment team.   Patients discharged the day of surgery will not be allowed to drive home, and someone needs to stay with them for 24 hours.   SURGICAL WAITING ROOM VISITATION Patients having surgery or a procedure may have no more than 2 support people in the waiting area - these visitors may rotate.   Children under the age of 82 must have an adult with them who is not the patient. If the patient needs to stay at the hospital during part of their recovery, the visitor guidelines for inpatient rooms apply. Pre-op nurse will coordinate an appropriate time for 1 support person to accompany patient in pre-op.  This support person may not rotate.   Please refer to [email or web address] for the visitor guidelines for Inpatients (after your surgery is over and you are in a regular room).    Special instructions:    Oral Hygiene is also important to reduce your risk of infection.  Remember - BRUSH YOUR TEETH THE MORNING OF SURGERY WITH YOUR REGULAR TOOTHPASTE   [facility]- Preparing For Surgery  Before surgery, you can play an important role. Because skin is not  sterile, your skin needs to be as free of germs as possible. You can reduce the number of germs on your skin by washing with CHG (chlorahexidine gluconate) Soap before surgery.  CHG is an antiseptic cleaner which kills germs and bonds with the skin to continue killing germs even after washing.     Please do not use if you have an allergy to CHG or antibacterial soaps. If your skin becomes reddened/irritated stop using the CHG.  Do not shave (including legs and underarms) for at least 48 hours prior to first CHG shower. It is OK to shave your face.  Please follow these instructions carefully.     Shower the NIGHT BEFORE SURGERY and the MORNING OF SURGERY with CHG Soap.   If you chose to wash your hair, wash your hair first as usual with your normal shampoo. After you shampoo, rinse your hair and body thoroughly to remove the shampoo.  Then [employer] and genitals (private parts) with your normal soap and rinse thoroughly to remove  soap.  After that Use CHG Soap as you would any other liquid soap. You can apply CHG directly to the skin and wash gently with a scrungie or a clean washcloth.   Apply the CHG Soap to your body ONLY FROM THE NECK DOWN.  Do not use on open wounds or open sores. Avoid contact with your eyes, ears, mouth and genitals (private parts). Wash Face and genitals (private parts)  with your normal soap.   Wash thoroughly, paying special attention to the area where your surgery will be performed.  Thoroughly rinse your body with warm water from the neck down.  DO NOT shower/wash with your normal soap after using and rinsing off the CHG Soap.  Pat yourself dry with a CLEAN TOWEL.  Wear CLEAN PAJAMAS to bed the night before surgery  Place CLEAN SHEETS on your bed the night before your surgery  DO NOT SLEEP WITH PETS.   Day of Surgery:  Take a shower with CHG soap. Wear Clean/Comfortable clothing the morning of surgery Do not apply any deodorants/lotions.   Remember to  brush your teeth WITH YOUR REGULAR TOOTHPASTE.    If you received a COVID test during your pre-op visit, it is requested that you wear a mask when out in public, stay away from anyone that may not be feeling well, and notify your surgeon if you develop symptoms. If you have been in contact with anyone that has tested positive in the last 10 days, please notify your surgeon.    Please read over the following fact sheets that you were given.

## 2022-11-06 NOTE — Progress Notes (Signed)
Surgical Instructions    Your procedure is scheduled on Tuesday November 17, 2022.  Report to [facility] Main Entrance "A" at 5:30 A.M., then check in with the Admitting office.  Call this number if you have problems the morning of surgery:  [phone]   If you have any questions prior to your surgery date call [phone]: Open Monday-Friday 8am-4pm If you experience any cold or flu symptoms such as cough, fever, chills, shortness of breath, etc. between now and your scheduled surgery, please notify us at the above number     Remember:  Do not eat after midnight the night before your surgery  You may drink clear liquids until 4:30 the morning of your surgery.   Clear liquids allowed are: Water, Non-Citrus Juices (without pulp), Carbonated Beverages, Clear Tea, Black Coffee ONLY (NO MILK, CREAM OR POWDERED CREAMER of any kind), and Gatorade    Take these medicines the morning of surgery with A SIP OF WATER:  amLODipine (NORVASC)  metoprolol tartrate (LOPRESSOR)  pantoprazole (PROTONIX)  fenofibrate   If Needed:  HYDROcodone-acetaminophen (NORCO)  Oxycodone   Follow your surgeon's instructions on when to stop Aspirin and Plavix.  If no instructions were given by your surgeon then you will need to call the office to get those instructions.    As of today, STOP taking any Aspirin (unless otherwise instructed by your surgeon) Aleve, Naproxen, Ibuprofen, Motrin, Advil, Goody's, BC's, all herbal medications, fish oil,all vitamins, and diclofenac (VOLTAREN).           Do not wear jewelry or makeup. Do not wear lotions, powders, perfumes/cologne or deodorant. Do not shave 48 hours prior to surgery.  Men may shave face and neck. Do not bring valuables to the hospital. Do not wear nail polish, gel polish, artificial nails, or any other type of covering on natural nails (fingers and toes) If you have artificial nails or gel coating that need to be removed by a nail salon, please have this  removed prior to surgery. Artificial nails or gel coating may interfere with anesthesia's ability to adequately monitor your vital signs.  [facility] is not responsible for any belongings or valuables.    Do NOT Smoke (Tobacco/Vaping)  24 hours prior to your procedure  If you use a CPAP at night, you may bring your mask for your overnight stay.   Contacts, glasses, hearing aids, dentures or partials may not be worn into surgery, please bring cases for these belongings   For patients admitted to the hospital, discharge time will be determined by your treatment team.   Patients discharged the day of surgery will not be allowed to drive home, and someone needs to stay with them for 24 hours.   SURGICAL WAITING ROOM VISITATION Patients having surgery or a procedure may have no more than 2 support people in the waiting area - these visitors may rotate.   Children under the age of 27 must have an adult with them who is not the patient. If the patient needs to stay at the hospital during part of their recovery, the visitor guidelines for inpatient rooms apply. Pre-op nurse will coordinate an appropriate time for 1 support person to accompany patient in pre-op.  This support person may not rotate.   Please refer to [email or web address] for the visitor guidelines for Inpatients (after your surgery is over and you are in a regular room).    Special instructions:    Oral Hygiene is also important to reduce your risk of  infection.  Remember - BRUSH YOUR TEETH THE MORNING OF SURGERY WITH YOUR REGULAR TOOTHPASTE   [facility]- Preparing For Surgery  Before surgery, you can play an important role. Because skin is not sterile, your skin needs to be as free of germs as possible. You can reduce the number of germs on your skin by washing with CHG (chlorahexidine gluconate) Soap before surgery.  CHG is an antiseptic cleaner which kills germs and bonds  with the skin to continue killing germs even after washing.     Please do not use if you have an allergy to CHG or antibacterial soaps. If your skin becomes reddened/irritated stop using the CHG.  Do not shave (including legs and underarms) for at least 48 hours prior to first CHG shower. It is OK to shave your face.  Please follow these instructions carefully.     Shower the NIGHT BEFORE SURGERY and the MORNING OF SURGERY with CHG Soap.   If you chose to wash your hair, wash your hair first as usual with your normal shampoo. After you shampoo, rinse your hair and body thoroughly to remove the shampoo.  Then [employer] and genitals (private parts) with your normal soap and rinse thoroughly to remove soap.  After that Use CHG Soap as you would any other liquid soap. You can apply CHG directly to the skin and wash gently with a scrungie or a clean washcloth.   Apply the CHG Soap to your body ONLY FROM THE NECK DOWN.  Do not use on open wounds or open sores. Avoid contact with your eyes, ears, mouth and genitals (private parts). Wash Face and genitals (private parts)  with your normal soap.   Wash thoroughly, paying special attention to the area where your surgery will be performed.  Thoroughly rinse your body with warm water from the neck down.  DO NOT shower/wash with your normal soap after using and rinsing off the CHG Soap.  Pat yourself dry with a CLEAN TOWEL.  Wear CLEAN PAJAMAS to bed the night before surgery  Place CLEAN SHEETS on your bed the night before your surgery  DO NOT SLEEP WITH PETS.   Day of Surgery:  Take a shower with CHG soap. Wear Clean/Comfortable clothing the morning of surgery Do not apply any deodorants/lotions.   Remember to brush your teeth WITH YOUR REGULAR TOOTHPASTE.    If you received a COVID test during your pre-op visit, it is requested that you wear a mask when out in public, stay away from anyone that may not be feeling well, and notify your  surgeon if you develop symptoms. If you have been in contact with anyone that has tested positive in the last 10 days, please notify your surgeon.    Please read over the following fact sheets that you were given.

## 2022-11-09 ENCOUNTER — Other Ambulatory Visit: Payer: Self-pay

## 2022-11-09 ENCOUNTER — Encounter (HOSPITAL_COMMUNITY): Payer: Self-pay

## 2022-11-09 ENCOUNTER — Encounter (HOSPITAL_COMMUNITY)
Admission: RE | Admit: 2022-11-09 | Discharge: 2022-11-09 | Disposition: A | Discharge status: Home / Self Care | Payer: Medicare HMO | Source: Ambulatory Visit | Attending: Otolaryngology | Admitting: Otolaryngology

## 2022-11-09 VITALS — BP 158/74 | HR 69 | Temp 97.9°F | Resp 18 | Ht 72.0 in | Wt 217.0 lb

## 2022-11-09 DIAGNOSIS — I4510 Unspecified right bundle-branch block: Secondary | ICD-10-CM | POA: Insufficient documentation

## 2022-11-09 DIAGNOSIS — Z01818 Encounter for other preprocedural examination: Secondary | ICD-10-CM | POA: Insufficient documentation

## 2022-11-09 HISTORY — DX: Personal history of urinary calculi: Z87.442

## 2022-11-09 LAB — BASIC METABOLIC PANEL
Anion gap: 10 (ref 5–15)
BUN: 20 mg/dL (ref 8–23)
CO2: 20 mmol/L — ABNORMAL LOW (ref 22–32)
Calcium: 9.7 mg/dL (ref 8.9–10.3)
Chloride: 105 mmol/L (ref 98–111)
Creatinine, Ser: 1.26 mg/dL — ABNORMAL HIGH (ref 0.61–1.24)
GFR, Estimated: >60 mL/min (ref >60)
Glucose, Bld: 251 mg/dL — ABNORMAL HIGH (ref 70–99)
Potassium: 4.5 mmol/L (ref 3.5–5.1)
Sodium: 135 mmol/L (ref 135–145)

## 2022-11-09 LAB — CBC
HCT: 39.2 % (ref 39.0–52.0)
Hemoglobin: 12.6 g/dL — ABNORMAL LOW (ref 13.0–17.0)
MCH: 29.9 pg (ref 26.0–34.0)
MCHC: 32.1 g/dL (ref 30.0–36.0)
MCV: 92.9 fL (ref 80.0–100.0)
Platelets: 168 10*3/uL (ref 150–400)
RBC: 4.22 MIL/uL (ref 4.22–5.81)
RDW: 13.7 % (ref 11.5–15.5)
WBC: 7.3 10*3/uL (ref 4.0–10.5)
nRBC: 0 % (ref 0.0–0.2)

## 2022-11-09 LAB — GLUCOSE, CAPILLARY: Glucose-Capillary: 247 mg/dL — ABNORMAL HIGH (ref 70–99)

## 2022-11-09 NOTE — Progress Notes (Signed)
PCP - Elfredia Nevins, MD Cardiologist - Mihai Croitoru,MD  PPM/ICD - denies Device Orders -  Rep Notified -   Chest x-ray -08/08/22  EKG - 08/08/22 Stress Test - 08/25/17 ECHO - 12/24/09 Cardiac Cath - 04/14/16  Sleep Study - 2022 CPAP - yes   Fasting Blood Sugar - 150-170 Checks Blood Sugar every morning  Last dose of GLP1 agonist- na  GLP1 instructions:   Blood Thinner Instructions: hold 5 days before surgery per [facility] care instructions.  Aspirin Instructions: pt states he will call surgeons office to find out when to hold aspirin.  ERAS Protcol - clear liquids until 0430 PRE-SURGERY Ensure or G2- no  COVID TEST- na   Anesthesia review: yes-cardiac clearance note 10/29/22  Patient denies shortness of breath, fever, cough and chest pain at PAT appointment   All instructions explained to the patient, with a verbal understanding of the material. Patient agrees to go over the instructions while at home for a better understanding. Patient also instructed to wear a mask when out in public prior to surgery. The opportunity to ask questions was provided.

## 2022-11-10 LAB — HEMOGLOBIN A1C
Hgb A1c MFr Bld: 7.7 % — ABNORMAL HIGH (ref 4.8–5.6)
Mean Plasma Glucose: 174 mg/dL

## 2022-11-10 NOTE — Progress Notes (Signed)
Anesthesia Chart Review:  Follows with cardiology for history of CAD s/p CABG 2017 (history of multiple prior stents), HLD, HTN.  Stress test 08/2017 was low risk.  Seen by Robin Searing, NP 10/29/2022 for preop evaluation.  Per note, "-Patient's RCRI score is 0.9% The patient affirms he has been doing well without any new cardiac symptoms. They are able to achieve 4 METS without cardiac limitations. Therefore, based on ACC/AHA guidelines, the patient would be at acceptable risk for the planned procedure without further cardiovascular testing. The patient was advised that if he develops new symptoms prior to surgery to contact our office to arrange for a follow-up visit, and he verbalized understanding. The patient was advised that if he develops new symptoms prior to surgery to contact our office to arrange for a follow-up visit, and he verbalized understanding. Patient can hold Plavix 5 days prior to procedure and should restart when safest postprocedure."  Non-insulin-dependent DM2, A1c 7.7 on preop labs.  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  I reviewed the images as there is no official read as of yet and reached out to [facility] regarding patient's ability to proceed with inspire device implant.  Patient was currently seen by Dr. Drucie Ip on 11/13/2022.  Dr. Drucie Ip advised patient would likely need cervical decompression surgery and he consulted Dr. Shon Baton for input.  Dr. Shon Baton advised that [facility] device implant would not preclude ability to subsequently undergo ACDF.  Dr. [name] was also made aware of patient's cervical spine stenosis and he stated he would reach out to Dr. Shon Baton to discuss directly.  EKG 08/08/2022: Sinus rhythm.  5.  Right bundle branch block.  Anterolateral infarct, age undetermined.  Nuclear stress 08/25/2017: Blood  pressure demonstrated a hypertensive response to exercise. This is a low risk study. The left ventricular ejection fraction is normal (55-65%).   Diaphragmatic attenuation no ischemia or infarct ECG positive with exercise 1 mm ST depression lead 2 EF normal 56%    Zannie Cove [facility]/Anesthesiology Phone [phone] 11/16/2022 9:00 AM

## 2022-11-11 ENCOUNTER — Ambulatory Visit (HOSPITAL_COMMUNITY)
Admission: RE | Admit: 2022-11-11 | Discharge: 2022-11-11 | Disposition: A | Discharge status: Home / Self Care | Payer: Medicare HMO | Source: Ambulatory Visit | Attending: Cardiovascular Disease | Admitting: Cardiovascular Disease

## 2022-11-11 DIAGNOSIS — I10 Essential (primary) hypertension: Secondary | ICD-10-CM | POA: Diagnosis not present

## 2022-11-12 DIAGNOSIS — M4802 Spinal stenosis, cervical region: Secondary | ICD-10-CM | POA: Diagnosis not present

## 2022-11-12 DIAGNOSIS — M5412 Radiculopathy, cervical region: Secondary | ICD-10-CM | POA: Diagnosis not present

## 2022-11-16 NOTE — Anesthesia Preprocedure Evaluation (Signed)
Anesthesia Evaluation  Patient identified by MRN, date of birth, ID band Patient awake    Reviewed: Allergy & Precautions, NPO status , Patient's Chart, lab work & pertinent test results, reviewed documented beta blocker date and time   Airway Mallampati: II  TM Distance: >3 FB Neck ROM: Full    Dental  (+) Dental Advisory Given   Pulmonary sleep apnea    breath sounds clear to auscultation       Cardiovascular hypertension, Pt. on medications and Pt. on home beta blockers + CAD, + Cardiac Stents, + CABG and + Peripheral Vascular Disease  + dysrhythmias  Rhythm:Regular Rate:Normal     Neuro/Psych negative neurological ROS     GI/Hepatic Neg liver ROS,GERD  ,,  Endo/Other  diabetes, Type 2, Oral Hypoglycemic Agents    Renal/GU Renal InsufficiencyRenal disease     Musculoskeletal  (+) Arthritis ,    Abdominal   Peds  Hematology negative hematology ROS (+)   Anesthesia Other Findings   Reproductive/Obstetrics                             Anesthesia Physical Anesthesia Plan  ASA: 3  Anesthesia Plan: General   Post-op Pain Management: Tylenol PO (pre-op)*   Induction: Intravenous  PONV Risk Score and Plan: 2 and Dexamethasone, Ondansetron and Treatment may vary due to age or medical condition  Airway Management Planned: Oral ETT and Video Laryngoscope Planned  Additional Equipment: None  Intra-op Plan:   Post-operative Plan: Extubation in OR  Informed Consent: I have reviewed the patients History and Physical, chart, labs and discussed the procedure including the risks, benefits and alternatives for the proposed anesthesia with the patient or authorized representative who has indicated his/her understanding and acceptance.     Dental advisory given  Plan Discussed with: CRNA  Anesthesia Plan Comments: (AT note by Antionette Poles, PA-C: Follows with cardiology for history of CAD  s/p CABG 2017 (history of multiple prior stents), HLD, HTN.  Stress test 08/2017 was low risk.  Seen by Robin Searing, NP 10/29/2022 for preop evaluation.  Per note, "-Patient's RCRI score is 0.9% The patient affirms he has been doing well without any new cardiac symptoms. They are able to achieve 4 METS without cardiac limitations. Therefore, based on ACC/AHA guidelines, the patient would be at acceptable risk for the planned procedure without further cardiovascular testing. The patient was advised that if he develops new symptoms prior to surgery to contact our office to arrange for a follow-up visit, and he verbalized understanding. The patient was advised that if he develops new symptoms prior to surgery to contact our office to arrange for a follow-up visit, and he verbalized understanding. Patient can hold Plavix 5 days prior to procedure and should restart when safest postprocedure."  Non-insulin-dependent DM2, A1c 7.7 on preop labs.  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  I reviewed the images as there is no official read as of yet and reached out to [facility] regarding patient's ability to proceed with inspire device implant.  Patient was currently seen by Dr. Drucie Ip on 11/13/2022.  Dr. Drucie Ip advised patient would likely need cervical decompression surgery and he consulted Dr. Shon Baton for input.  Dr. Shon Baton advised that [facility] device implant would not preclude ability to subsequently undergo ACDF.  Dr. [name] was  also made aware of patient's cervical spine stenosis and he stated he would reach out to Dr. Shon Baton to discuss directly.  EKG 08/08/2022: Sinus rhythm.  5.  Right bundle branch block.  Anterolateral infarct, age undetermined.  Nuclear stress 08/25/2017: ? Blood pressure demonstrated a hypertensive response to exercise. ? This is a  low risk study. ? The left ventricular ejection fraction is normal (55-65%).   Diaphragmatic attenuation no ischemia or infarct ECG positive with exercise 1 mm ST depression lead 2 EF normal 56%  )        Anesthesia Quick Evaluation

## 2022-11-17 ENCOUNTER — Ambulatory Visit (HOSPITAL_COMMUNITY)
Admission: RE | Admit: 2022-11-17 | Discharge: 2022-11-17 | Disposition: A | Discharge status: Home / Self Care | Payer: Medicare HMO | Attending: Otolaryngology | Admitting: Otolaryngology

## 2022-11-17 ENCOUNTER — Encounter (HOSPITAL_COMMUNITY): Payer: Self-pay | Admitting: Otolaryngology

## 2022-11-17 ENCOUNTER — Other Ambulatory Visit: Payer: Self-pay

## 2022-11-17 ENCOUNTER — Ambulatory Visit (HOSPITAL_COMMUNITY): Payer: Medicare HMO | Admitting: Physician Assistant

## 2022-11-17 ENCOUNTER — Encounter (HOSPITAL_COMMUNITY)
Admission: RE | Disposition: A | Discharge status: Home / Self Care | Payer: Self-pay | Source: Home / Self Care | Attending: Otolaryngology

## 2022-11-17 ENCOUNTER — Ambulatory Visit (HOSPITAL_BASED_OUTPATIENT_CLINIC_OR_DEPARTMENT_OTHER): Payer: Medicare HMO | Admitting: Certified Registered"

## 2022-11-17 ENCOUNTER — Ambulatory Visit (HOSPITAL_COMMUNITY): Payer: Medicare HMO

## 2022-11-17 DIAGNOSIS — Z791 Long term (current) use of non-steroidal anti-inflammatories (NSAID): Secondary | ICD-10-CM | POA: Diagnosis not present

## 2022-11-17 DIAGNOSIS — I2510 Atherosclerotic heart disease of native coronary artery without angina pectoris: Secondary | ICD-10-CM | POA: Insufficient documentation

## 2022-11-17 DIAGNOSIS — Z955 Presence of coronary angioplasty implant and graft: Secondary | ICD-10-CM

## 2022-11-17 DIAGNOSIS — I10 Essential (primary) hypertension: Secondary | ICD-10-CM

## 2022-11-17 DIAGNOSIS — Z79899 Other long term (current) drug therapy: Secondary | ICD-10-CM | POA: Diagnosis not present

## 2022-11-17 DIAGNOSIS — E785 Hyperlipidemia, unspecified: Secondary | ICD-10-CM | POA: Insufficient documentation

## 2022-11-17 DIAGNOSIS — E669 Obesity, unspecified: Secondary | ICD-10-CM | POA: Diagnosis not present

## 2022-11-17 DIAGNOSIS — Z01818 Encounter for other preprocedural examination: Secondary | ICD-10-CM

## 2022-11-17 DIAGNOSIS — Z48812 Encounter for surgical aftercare following surgery on the circulatory system: Secondary | ICD-10-CM | POA: Diagnosis not present

## 2022-11-17 DIAGNOSIS — Z7984 Long term (current) use of oral hypoglycemic drugs: Secondary | ICD-10-CM | POA: Insufficient documentation

## 2022-11-17 DIAGNOSIS — G4733 Obstructive sleep apnea (adult) (pediatric): Secondary | ICD-10-CM | POA: Diagnosis not present

## 2022-11-17 DIAGNOSIS — Z951 Presence of aortocoronary bypass graft: Secondary | ICD-10-CM | POA: Insufficient documentation

## 2022-11-17 DIAGNOSIS — Z6829 Body mass index (BMI) 29.0-29.9, adult: Secondary | ICD-10-CM | POA: Diagnosis not present

## 2022-11-17 DIAGNOSIS — E1151 Type 2 diabetes mellitus with diabetic peripheral angiopathy without gangrene: Secondary | ICD-10-CM | POA: Diagnosis not present

## 2022-11-17 DIAGNOSIS — Z7902 Long term (current) use of antithrombotics/antiplatelets: Secondary | ICD-10-CM | POA: Diagnosis not present

## 2022-11-17 DIAGNOSIS — E119 Type 2 diabetes mellitus without complications: Secondary | ICD-10-CM | POA: Insufficient documentation

## 2022-11-17 DIAGNOSIS — K219 Gastro-esophageal reflux disease without esophagitis: Secondary | ICD-10-CM | POA: Insufficient documentation

## 2022-11-17 HISTORY — PX: IMPLANTATION OF HYPOGLOSSAL NERVE STIMULATOR: SHX6827

## 2022-11-17 LAB — GLUCOSE, CAPILLARY
Glucose-Capillary: 174 mg/dL — ABNORMAL HIGH (ref 70–99)
Glucose-Capillary: 174 mg/dL — ABNORMAL HIGH (ref 70–99)

## 2022-11-17 SURGERY — INSERTION, HYPOGLOSSAL NERVE STIMULATOR
Anesthesia: General | Laterality: Right

## 2022-11-17 MED ORDER — FENTANYL CITRATE (PF) 250 MCG/5ML IJ SOLN
INTRAMUSCULAR | Status: DC | PRN
  Administered 2022-11-17 (×2): 100 ug via INTRAVENOUS

## 2022-11-17 MED ORDER — FENTANYL CITRATE (PF) 100 MCG/2ML IJ SOLN: 25.0000 ug | INTRAMUSCULAR | Status: DC | PRN

## 2022-11-17 MED ORDER — SODIUM CHLORIDE 0.9 % IV SOLN
0.1500 ug/kg/min | INTRAVENOUS | Status: AC
  Administered 2022-11-17: .15 ug/kg/min via INTRAVENOUS
  Filled 2022-11-17: qty 2000

## 2022-11-17 MED ORDER — SUCCINYLCHOLINE CHLORIDE 200 MG/10ML IV SOSY
PREFILLED_SYRINGE | INTRAVENOUS | Status: AC
  Filled 2022-11-17: qty 10

## 2022-11-17 MED ORDER — ONDANSETRON HCL 4 MG/2ML IJ SOLN
INTRAMUSCULAR | Status: DC | PRN
  Administered 2022-11-17: 4 mg via INTRAVENOUS

## 2022-11-17 MED ORDER — ORAL CARE MOUTH RINSE: 15.0000 mL | Freq: Once | OROMUCOSAL | Status: AC

## 2022-11-17 MED ORDER — ONDANSETRON HCL 4 MG/2ML IJ SOLN
INTRAMUSCULAR | Status: AC
  Filled 2022-11-17: qty 2

## 2022-11-17 MED ORDER — LACTATED RINGERS IV SOLN: INTRAVENOUS | Status: DC

## 2022-11-17 MED ORDER — AMISULPRIDE (ANTIEMETIC) 5 MG/2ML IV SOLN: 10.0000 mg | Freq: Once | INTRAVENOUS | Status: DC | PRN

## 2022-11-17 MED ORDER — MIDAZOLAM HCL 2 MG/2ML IJ SOLN
INTRAMUSCULAR | Status: AC
  Filled 2022-11-17: qty 2

## 2022-11-17 MED ORDER — CEFAZOLIN SODIUM-DEXTROSE 2-4 GM/100ML-% IV SOLN
2.0000 g | INTRAVENOUS | Status: AC
  Administered 2022-11-17: 2 g via INTRAVENOUS
  Filled 2022-11-17: qty 100

## 2022-11-17 MED ORDER — LIDOCAINE 2% (20 MG/ML) 5 ML SYRINGE
INTRAMUSCULAR | Status: DC | PRN
  Administered 2022-11-17: 60 mg via INTRAVENOUS

## 2022-11-17 MED ORDER — LIDOCAINE-EPINEPHRINE 1 %-1:100000 IJ SOLN
INTRAMUSCULAR | Status: AC
  Filled 2022-11-17: qty 1

## 2022-11-17 MED ORDER — PROPOFOL 500 MG/50ML IV EMUL
INTRAVENOUS | Status: DC | PRN
  Administered 2022-11-17: 125 ug/kg/min via INTRAVENOUS

## 2022-11-17 MED ORDER — LIDOCAINE 2% (20 MG/ML) 5 ML SYRINGE
INTRAMUSCULAR | Status: AC
  Filled 2022-11-17: qty 5

## 2022-11-17 MED ORDER — PHENYLEPHRINE 80 MCG/ML (10ML) SYRINGE FOR IV PUSH (FOR BLOOD PRESSURE SUPPORT)
PREFILLED_SYRINGE | INTRAVENOUS | Status: DC | PRN
  Administered 2022-11-17 (×2): 160 ug via INTRAVENOUS

## 2022-11-17 MED ORDER — LIDOCAINE-EPINEPHRINE 1 %-1:100000 IJ SOLN
INTRAMUSCULAR | Status: DC | PRN
  Administered 2022-11-17: 5 mL

## 2022-11-17 MED ORDER — ACETAMINOPHEN 500 MG PO TABS
1000.0000 mg | ORAL_TABLET | Freq: Once | ORAL | Status: AC
  Administered 2022-11-17: 1000 mg via ORAL
  Filled 2022-11-17: qty 2

## 2022-11-17 MED ORDER — CHLORHEXIDINE GLUCONATE 0.12 % MT SOLN
15.0000 mL | Freq: Once | OROMUCOSAL | Status: AC
  Administered 2022-11-17: 15 mL via OROMUCOSAL
  Filled 2022-11-17: qty 15

## 2022-11-17 MED ORDER — PROPOFOL 10 MG/ML IV BOLUS
INTRAVENOUS | Status: AC
  Filled 2022-11-17: qty 20

## 2022-11-17 MED ORDER — PROPOFOL 10 MG/ML IV BOLUS
INTRAVENOUS | Status: DC | PRN
  Administered 2022-11-17: 200 mg via INTRAVENOUS

## 2022-11-17 MED ORDER — DEXAMETHASONE SODIUM PHOSPHATE 10 MG/ML IJ SOLN
INTRAMUSCULAR | Status: DC | PRN
  Administered 2022-11-17: 5 mg via INTRAVENOUS

## 2022-11-17 MED ORDER — FENTANYL CITRATE (PF) 250 MCG/5ML IJ SOLN
INTRAMUSCULAR | Status: AC
  Filled 2022-11-17: qty 5

## 2022-11-17 MED ORDER — LACTATED RINGERS IV SOLN: INTRAVENOUS | Status: DC | PRN

## 2022-11-17 MED ORDER — INSULIN ASPART 100 UNIT/ML IJ SOLN
0.0000 [IU] | INTRAMUSCULAR | Status: DC | PRN
  Administered 2022-11-17: 2 [IU] via SUBCUTANEOUS
  Filled 2022-11-17: qty 1

## 2022-11-17 MED ORDER — PHENYLEPHRINE HCL-NACL 20-0.9 MG/250ML-% IV SOLN
INTRAVENOUS | Status: DC | PRN
  Administered 2022-11-17: 30 ug/min via INTRAVENOUS

## 2022-11-17 MED ORDER — 0.9 % SODIUM CHLORIDE (POUR BTL) OPTIME
TOPICAL | Status: DC | PRN
  Administered 2022-11-17: 1000 mL

## 2022-11-17 MED ORDER — MIDAZOLAM HCL 2 MG/2ML IJ SOLN
INTRAMUSCULAR | Status: DC | PRN
  Administered 2022-11-17: 2 mg via INTRAVENOUS

## 2022-11-17 MED ORDER — DEXAMETHASONE SODIUM PHOSPHATE 10 MG/ML IJ SOLN
INTRAMUSCULAR | Status: AC
  Filled 2022-11-17: qty 1

## 2022-11-17 MED ORDER — SUCCINYLCHOLINE CHLORIDE 200 MG/10ML IV SOSY
PREFILLED_SYRINGE | INTRAVENOUS | Status: DC | PRN
  Administered 2022-11-17: 120 mg via INTRAVENOUS

## 2022-11-17 SURGICAL SUPPLY — 72 items
ACC NRSTM 4 TRQ WRNCH STRL (MISCELLANEOUS)
ADH SKN CLS APL DERMABOND .7 (GAUZE/BANDAGES/DRESSINGS) ×2
BAG COUNTER SPONGE SURGICOUNT (BAG) ×1 IMPLANT
BAG SPNG CNTER NS LX DISP (BAG) ×1
BLADE CLIPPER SURG (BLADE) IMPLANT
BLADE SURG 15 STRL LF DISP TIS (BLADE) ×3 IMPLANT
BLADE SURG 15 STRL SS (BLADE) ×3
CANISTER SUCT 3000ML PPV (MISCELLANEOUS) ×1 IMPLANT
CORD BIPOLAR FORCEPS 12FT (ELECTRODE) ×1 IMPLANT
COVER PROBE W GEL 5X96 (DRAPES) ×1 IMPLANT
COVER SURGICAL LIGHT HANDLE (MISCELLANEOUS) ×1 IMPLANT
DERMABOND ADVANCED .7 DNX12 (GAUZE/BANDAGES/DRESSINGS) ×2 IMPLANT
DRAPE C-ARM 35X43 STRL (DRAPES) ×1 IMPLANT
DRAPE HEAD BAR (DRAPES) ×1 IMPLANT
DRAPE INCISE IOBAN 66X45 STRL (DRAPES) ×1 IMPLANT
DRAPE MICROSCOPE LEICA 54X105 (DRAPES) ×1 IMPLANT
DRAPE UTILITY XL STRL (DRAPES) ×1 IMPLANT
DRSG TEGADERM 4X4.75 (GAUZE/BANDAGES/DRESSINGS) ×3 IMPLANT
ELECT COATED BLADE 2.86 ST (ELECTRODE) ×1 IMPLANT
ELECT EMG 18 NIMS (NEUROSURGERY SUPPLIES) ×1
ELECT REM PT RETURN 9FT ADLT (ELECTROSURGICAL) ×1
ELECTRODE EMG 18 NIMS (NEUROSURGERY SUPPLIES) ×1 IMPLANT
ELECTRODE REM PT RTRN 9FT ADLT (ELECTROSURGICAL) ×1 IMPLANT
FORCEPS BIPOLAR SPETZLER 8 1.0 (NEUROSURGERY SUPPLIES) ×1 IMPLANT
GAUZE 4X4 16PLY ~~LOC~~+RFID DBL (SPONGE) ×1 IMPLANT
GAUZE SPONGE 4X4 12PLY STRL (GAUZE/BANDAGES/DRESSINGS) ×1 IMPLANT
GENERATOR PULSE INSPIRE (Generator) ×1 IMPLANT
GENERATOR PULSE INSPIRE IV (Generator) ×1 IMPLANT
GLOVE BIO SURGEON STRL SZ 6.5 (GLOVE) IMPLANT
GLOVE BIO SURGEON STRL SZ7.5 (GLOVE) ×1 IMPLANT
GOWN STRL REUS W/ TWL LRG LVL3 (GOWN DISPOSABLE) ×3 IMPLANT
GOWN STRL REUS W/TWL LRG LVL3 (GOWN DISPOSABLE) ×3
KIT BASIN OR (CUSTOM PROCEDURE TRAY) ×1 IMPLANT
KIT NEURO ACCESSORY W/WRENCH (MISCELLANEOUS) IMPLANT
KIT TURNOVER KIT B (KITS) ×1 IMPLANT
LEAD SENSING RESP INSPIRE (Lead) ×1 IMPLANT
LEAD SENSING RESP INSPIRE IV (Lead) ×1 IMPLANT
LEAD SLEEP STIM INSPIRE IV/V (Lead) ×1 IMPLANT
LEAD SLEEP STIMULATION INSPIRE (Lead) ×1 IMPLANT
LOOP VASCLR MAXI BLUE 18IN ST (MISCELLANEOUS) ×1 IMPLANT
LOOP VASCULAR MAXI 18 BLUE (MISCELLANEOUS) ×1
LOOP VASCULAR MINI 18 RED (MISCELLANEOUS) ×1
LOOPS VASCLR MAXI BLUE 18IN ST (MISCELLANEOUS) ×1 IMPLANT
MARKER SKIN DUAL TIP RULER LAB (MISCELLANEOUS) ×2 IMPLANT
NDL HYPO 25GX1X1/2 BEV (NEEDLE) ×1 IMPLANT
NEEDLE HYPO 25GX1X1/2 BEV (NEEDLE) ×1 IMPLANT
NS IRRIG 1000ML POUR BTL (IV SOLUTION) ×1 IMPLANT
PAD ARMBOARD 7.5X6 YLW CONV (MISCELLANEOUS) ×1 IMPLANT
PASSER CATH 38CM DISP (INSTRUMENTS) ×1 IMPLANT
PENCIL SMOKE EVACUATOR (MISCELLANEOUS) ×1 IMPLANT
POSITIONER HEAD DONUT 9IN (MISCELLANEOUS) ×1 IMPLANT
PROBE NERVE STIMULATOR (NEUROSURGERY SUPPLIES) ×1 IMPLANT
REMOTE CONTROL SLEEP INSPIRE (MISCELLANEOUS) ×1 IMPLANT
SET WALTER ACTIVATION W/DRAPE (SET/KITS/TRAYS/PACK) ×1 IMPLANT
SPONGE INTESTINAL PEANUT (DISPOSABLE) ×1 IMPLANT
STAPLER VISISTAT 35W (STAPLE) ×1 IMPLANT
SUT SILK 2 0 SH (SUTURE) ×1 IMPLANT
SUT SILK 3 0 REEL (SUTURE) ×1 IMPLANT
SUT SILK 3 0 SH 30 (SUTURE) ×2 IMPLANT
SUT SILK 3-0 (SUTURE) ×1
SUT SILK 3-0 RB1 30XBRD (SUTURE) ×1
SUT VIC AB 3-0 SH 27 (SUTURE) ×2
SUT VIC AB 3-0 SH 27X BRD (SUTURE) ×2 IMPLANT
SUT VIC AB 4-0 PS2 27 (SUTURE) ×2 IMPLANT
SUTURE SILK 3-0 RB1 30XBRD (SUTURE) ×1 IMPLANT
SYR 10ML LL (SYRINGE) ×1 IMPLANT
TAPE CLOTH SURG 4X10 WHT LF (GAUZE/BANDAGES/DRESSINGS) ×1 IMPLANT
TAPE CLOTH SURG 6X10 WHT LF (GAUZE/BANDAGES/DRESSINGS) IMPLANT
TOWEL GREEN STERILE (TOWEL DISPOSABLE) ×1 IMPLANT
TRAY ENT MC OR (CUSTOM PROCEDURE TRAY) ×1 IMPLANT
VASCULAR TIE MAXI BLUE 18IN ST (MISCELLANEOUS) ×1
VASCULAR TIE MINI RED 18IN STL (MISCELLANEOUS) ×1 IMPLANT

## 2022-11-17 NOTE — Op Note (Signed)

## 2022-11-17 NOTE — Brief Op Note (Signed)
11/17/2022  10:50 AM  PATIENT:  Jonathan Shepard  72 y.o. male  PRE-OPERATIVE DIAGNOSIS:  BMI 30.0-30.9,adult Obstructive Sleep Apnea  POST-OPERATIVE DIAGNOSIS:  BMI 30.0-30.9,adult Obstructive Sleep Apnea  PROCEDURE:  Procedure(s): IMPLANTATION OF HYPOGLOSSAL NERVE STIMULATOR (Right)  SURGEON:  Surgeon(s) and Role:    Christia Reading, MD - Primary  PHYSICIAN ASSISTANT:   ASSISTANTS: RNFA   ANESTHESIA:   general  EBL:  Minimal   BLOOD ADMINISTERED:none  DRAINS: none   LOCAL MEDICATIONS USED:  LIDOCAINE   SPECIMEN:  No Specimen  DISPOSITION OF SPECIMEN:  N/A  COUNTS:  YES  TOURNIQUET:  * No tourniquets in log *  DICTATION: .Note written in EPIC  PLAN OF CARE: Discharge to home after PACU  PATIENT DISPOSITION:  PACU - hemodynamically stable.   Delay start of Pharmacological VTE agent (>24hrs) due to surgical blood loss or risk of bleeding: no

## 2022-11-17 NOTE — Anesthesia Procedure Notes (Signed)
Procedure Name: Intubation Date/Time: 11/17/2022 9:27 AM  Performed by: Gus Puma, CRNAPre-anesthesia Checklist: Patient identified, Emergency Drugs available, Suction available and Patient being monitored Patient Re-evaluated:Patient Re-evaluated prior to induction Oxygen Delivery Method: Circle System Utilized Preoxygenation: Pre-oxygenation with 100% oxygen Induction Type: IV induction Ventilation: Mask ventilation without difficulty and Oral airway inserted - appropriate to patient size Laryngoscope Size: Glidescope and 4 Grade View: Grade I Tube type: Oral Tube size: 7.5 mm Number of attempts: 1 Airway Equipment and Method: Oral airway, Video-laryngoscopy and Rigid stylet Placement Confirmation: ETT inserted through vocal cords under direct vision, positive ETCO2 and breath sounds checked- equal and bilateral Secured at: 23 cm Tube secured with: Tape Dental Injury: Teeth and Oropharynx as per pre-operative assessment

## 2022-11-17 NOTE — Transfer of Care (Signed)
Immediate Anesthesia Transfer of Care Note  Patient: Jonathan Shepard  Procedure(s) Performed: IMPLANTATION OF HYPOGLOSSAL NERVE STIMULATOR (Right)  Patient Location: PACU  Anesthesia Type:General  Level of Consciousness: drowsy and patient cooperative  Airway & Oxygen Therapy: Patient Spontanous Breathing and Patient connected to nasal cannula oxygen  Post-op Assessment: Report given to RN and Post -op Vital signs reviewed and stable  Post vital signs: Reviewed and stable  Last Vitals:  Vitals Value Taken Time  BP 143/88 11/17/22 1115  Temp 36.2 C 11/17/22 1112  Pulse 70 11/17/22 1121  Resp 17 11/17/22 1121  SpO2 97 % 11/17/22 1121  Vitals shown include unvalidated device data.  Last Pain:  Vitals:   11/17/22 0717  TempSrc:   PainSc: 0-No pain         Complications: No notable events documented.

## 2022-11-17 NOTE — H&P (Signed)
Jonathan Shepard is an 72 y.o. male.   Chief Complaint: Sleep apnea HPI: 72 year old male with obstructive sleep apnea who has been unable to tolerate CPAP.  Past Medical History:  Diagnosis Date   Arthritis    CAD (coronary artery disease)    Diabetes (HCC)    Dyspnea    GERD (gastroesophageal reflux disease)    History of kidney stones    Hyperlipidemia    Hypertension    Joint pain    hand, related to work   Obesity    Sleep apnea     Past Surgical History:  Procedure Laterality Date   CARDIAC CATHETERIZATION N/A 04/14/2016   Procedure: Left Heart Cath and Coronary Angiography;  Surgeon: Peter M Swaziland, MD;  Location: [facility] INVASIVE CV LAB;  Service: Cardiovascular;  Laterality: N/A;   COLONOSCOPY WITH PROPOFOL N/A 01/09/2019   Procedure: COLONOSCOPY WITH PROPOFOL;  Surgeon: Corbin Ade, MD;  Location: [facility] SUITE;  Service: Endoscopy;  Laterality: N/A;  8:30am   CORONARY ARTERY BYPASS GRAFT N/A 04/22/2016   Procedure: CORONARY ARTERY BYPASS GRAFTING (CABG)x4 with endoscopic harvesting of right saphenous vein SVG to diagonal SVG to PDA Left Radial artery to OM 2 LIMA to LAD;  Surgeon: Loreli Slot, MD;  Location: [facility] OR;  Service: Open Heart Surgery;  Laterality: N/A;   DRUG INDUCED ENDOSCOPY Bilateral 09/28/2022   Procedure: DRUG INDUCED ENDOSCOPY;  Surgeon: Christia Reading, MD;  Location: [facility] OR;  Service: ENT;  Laterality: Bilateral;   EYE SURGERY Bilateral 2023   cataract surgery   RADIAL ARTERY HARVEST Left 04/22/2016   Procedure: LEFT RADIAL ARTERY HARVEST;  Surgeon: Loreli Slot, MD;  Location: [facility] OR;  Service: Open Heart Surgery;  Laterality: Left;   TEE WITHOUT CARDIOVERSION N/A 04/22/2016   Procedure: TRANSESOPHAGEAL ECHOCARDIOGRAM (TEE);  Surgeon: Loreli Slot, MD;  Location: [facility] OR;  Service: Open Heart Surgery;  Laterality: N/A;    Family History  Problem Relation Age of Onset   Coronary artery disease Father 6       CABG   Cancer  Father    Coronary artery disease Brother 35       CABG   Colon cancer Neg Hx    Colon polyps Neg Hx    Social History:  reports that he has never smoked. He has never used smokeless tobacco. He reports that he does not drink alcohol and does not use drugs.  Allergies: No Known Allergies  Medications Prior to Admission  Medication Sig Dispense Refill   amLODipine (NORVASC) 5 MG tablet Take 5 mg by mouth 2 (two) times daily.     aspirin EC 81 MG tablet Take 81 mg by mouth daily. Swallow whole.     clopidogrel (PLAVIX) 75 MG tablet Take 1 tablet (75 mg total) by mouth daily. 90 tablet 0   diclofenac (VOLTAREN) 75 MG EC tablet Take 75 mg by mouth 2 (two) times daily.     fenofibrate 160 MG tablet Take 160 mg by mouth daily.     Flaxseed, Linseed, (FLAX SEEDS PO) Take 1-2 capsules by mouth See admin instructions. Take 2 capsules in the morning and 1 capsule in the evening     glimepiride (AMARYL) 4 MG tablet Take 4 mg by mouth 2 (two) times daily.     metFORMIN (GLUCOPHAGE) 1000 MG tablet Take 1,000 mg by mouth 2 (two) times daily with a meal.     metoprolol tartrate (LOPRESSOR) 50 MG tablet Take 50  mg by mouth 2 (two) times daily.     Multiple Vitamin (MULTIVITAMIN WITH MINERALS) TABS tablet Take 1 tablet by mouth daily.     pantoprazole (PROTONIX) 40 MG tablet TAKE 1 TABLET EVERY DAY 90 tablet 1   pravastatin (PRAVACHOL) 80 MG tablet TAKE 1 TABLET EVERY DAY (Patient taking differently: Take 80 mg by mouth at bedtime.) 90 tablet 3   ramipril (ALTACE) 10 MG capsule Take 1 capsule (10 mg total) by mouth daily. 90 capsule 0   amLODipine (NORVASC) 10 MG tablet Take 1 tablet (10 mg total) by mouth daily. (Patient not taking: Reported on 11/05/2022) 180 tablet 1   Coenzyme Q10 (COQ-10) 100 MG CAPS Take 100 mg by mouth daily.     hydrochlorothiazide (MICROZIDE) 12.5 MG capsule Take 1 capsule (12.5 mg total) by mouth daily. (Patient not taking: Reported on 11/05/2022) 90 capsule 3    HYDROcodone-acetaminophen (NORCO) 10-325 MG tablet Take 1 tablet by mouth every 6 (six) hours as needed for moderate pain.  0   Oxycodone HCl 10 MG TABS Take 10 mg by mouth daily as needed (breakthrough pain).     sildenafil (VIAGRA) 100 MG tablet Take 100 mg by mouth daily as needed for erectile dysfunction.      Results for orders placed or performed during the hospital encounter of 11/17/22 (from the past 48 hour(s))  Glucose, capillary     Status: Abnormal   Collection Time: 11/17/22  6:42 AM  Result Value Ref Range   Glucose-Capillary 174 (H) 70 - 99 mg/dL    Comment: Glucose reference range applies only to samples taken after fasting for at least 8 hours.   No results found.  Review of Systems  All other systems reviewed and are negative.   Blood pressure (!) 154/78, pulse 68, temperature 97.8 F (36.6 C), temperature source Oral, resp. rate 18, height 6' (1.829 m), weight 99.8 kg, SpO2 98 %. Physical Exam Constitutional:      Appearance: Normal appearance. He is normal weight.  HENT:     Head: Normocephalic and atraumatic.     Right Ear: External ear normal.     Left Ear: External ear normal.     Nose: Nose normal.     Mouth/Throat:     Mouth: Mucous membranes are moist.     Pharynx: Oropharynx is clear.  Eyes:     Extraocular Movements: Extraocular movements intact.     Conjunctiva/sclera: Conjunctivae normal.     Pupils: Pupils are equal, round, and reactive to light.  Cardiovascular:     Rate and Rhythm: Normal rate.  Pulmonary:     Effort: Pulmonary effort is normal.  Musculoskeletal:     Cervical back: Normal range of motion.  Skin:    General: Skin is warm and dry.  Neurological:     General: No focal deficit present.     Mental Status: He is alert and oriented to person, place, and time.  Psychiatric:        Mood and Affect: Mood normal.        Behavior: Behavior normal.        Thought Content: Thought content normal.        Judgment: Judgment normal.       Assessment/Plan Obstructive sleep apnea and BMI 29.84  To OR for hypoglossal nerve stimulator placement.  Christia Reading, MD 11/17/2022, 8:43 AM

## 2022-11-18 ENCOUNTER — Encounter (HOSPITAL_COMMUNITY): Payer: Self-pay | Admitting: Otolaryngology

## 2022-11-18 NOTE — Anesthesia Postprocedure Evaluation (Signed)
Anesthesia Post Note  Patient: Jonathan Shepard  Procedure(s) Performed: IMPLANTATION OF HYPOGLOSSAL NERVE STIMULATOR (Right)     Patient location during evaluation: PACU Anesthesia Type: General Level of consciousness: awake and alert Pain management: pain level controlled Vital Signs Assessment: post-procedure vital signs reviewed and stable Respiratory status: spontaneous breathing, nonlabored ventilation, respiratory function stable and patient connected to nasal cannula oxygen Cardiovascular status: blood pressure returned to baseline and stable Postop Assessment: no apparent nausea or vomiting Anesthetic complications: no   No notable events documented.  Last Vitals:  Vitals:   11/17/22 1200 11/17/22 1215  BP: (!) 144/79 (!) 154/81  Pulse: 68 69  Resp: 18 12  Temp:  36.7 C  SpO2: 93% 93%    Last Pain:  Vitals:   11/17/22 1200  TempSrc:   PainSc: Asleep                 Kennieth Rad

## 2022-11-26 DIAGNOSIS — G4733 Obstructive sleep apnea (adult) (pediatric): Secondary | ICD-10-CM | POA: Diagnosis not present

## 2022-12-07 DIAGNOSIS — G4733 Obstructive sleep apnea (adult) (pediatric): Secondary | ICD-10-CM | POA: Diagnosis not present

## 2022-12-07 DIAGNOSIS — M1991 Primary osteoarthritis, unspecified site: Secondary | ICD-10-CM | POA: Diagnosis not present

## 2022-12-07 DIAGNOSIS — G894 Chronic pain syndrome: Secondary | ICD-10-CM | POA: Diagnosis not present

## 2022-12-07 DIAGNOSIS — E1129 Type 2 diabetes mellitus with other diabetic kidney complication: Secondary | ICD-10-CM | POA: Diagnosis not present

## 2022-12-07 DIAGNOSIS — M5412 Radiculopathy, cervical region: Secondary | ICD-10-CM | POA: Diagnosis not present

## 2022-12-07 DIAGNOSIS — L4050 Arthropathic psoriasis, unspecified: Secondary | ICD-10-CM | POA: Diagnosis not present

## 2022-12-07 DIAGNOSIS — I10 Essential (primary) hypertension: Secondary | ICD-10-CM | POA: Diagnosis not present

## 2022-12-25 ENCOUNTER — Encounter (HOSPITAL_BASED_OUTPATIENT_CLINIC_OR_DEPARTMENT_OTHER): Payer: Self-pay | Admitting: Pulmonary Disease

## 2022-12-25 ENCOUNTER — Ambulatory Visit (INDEPENDENT_AMBULATORY_CARE_PROVIDER_SITE_OTHER): Payer: Medicare HMO | Admitting: Pulmonary Disease

## 2022-12-25 VITALS — BP 128/70 | HR 74 | Ht 72.0 in | Wt 222.0 lb

## 2022-12-25 DIAGNOSIS — Z789 Other specified health status: Secondary | ICD-10-CM | POA: Diagnosis not present

## 2022-12-25 DIAGNOSIS — G4733 Obstructive sleep apnea (adult) (pediatric): Secondary | ICD-10-CM | POA: Diagnosis not present

## 2022-12-25 NOTE — Patient Instructions (Signed)
Follow-up in 4 to 6 weeks 

## 2022-12-25 NOTE — Progress Notes (Signed)
[place] [facility]  Chief Complaint  Patient presents with   Consult    Obstructive sleep apnea    Past Surgical History:  He  has a past surgical history that includes Cardiac catheterization (N/A, 04/14/2016); Coronary artery bypass graft (N/A, 04/22/2016); TEE without cardioversion (N/A, 04/22/2016); Radial artery harvest (Left, 04/22/2016); Colonoscopy with propofol (N/A, 01/09/2019); Drug induced endoscopy (Bilateral, 09/28/2022); Eye surgery (Bilateral, 2023); and Implantation of hypoglossal nerve stimulator (Right, 11/17/2022).  Past Medical History:  Arthritis, CAD, DM, GERD, Nephrolithiasis, HLD, HTN  Constitutional:  BP 128/70   Pulse 74   Ht 6' (1.829 m)   Wt 222 lb (100.7 kg)   SpO2 97%   BMI 30.11 kg/m   Brief Summary:  Jonathan Shepard is a 72 y.o. male with obstructive sleep apnea.  He has a hypoglossal nerve stimulator.      Subjective:   He had a sleep study with his PCP.  He was found to have sleep apnea.  Started on CPAP.  He had trouble with mask fit and didn't like marks on his face from mask.  He was seen by Dr. [name] with ENT.  Had Inspire device implanted 11/17/22.  He comes today for activation of his device.  He is still using his CPAP because his device hasn't been set yet.  He is not having neck or tongue pain.  Not having difficulty with speech or swallowing.  Physical Exam:   Appearance - well kempt   ENMT - no sinus tenderness, no oral exudate, no LAN, Mallampati 3 airway, no stridor  Respiratory - equal breath sounds bilaterally, no wheezing or rales  CV - s1s2 regular rate and rhythm, no murmurs  Ext - no clubbing, no edema  Skin - no rashes  Psych - normal mood and affect   Sleep Tests:  HST January 2023 >> AHI 23.3  Social History:  He  reports that he has never smoked. He has never used smokeless tobacco. He reports that he does not drink alcohol and does not use drugs.  Family History:   His family history includes Cancer in his father; Coronary artery disease (age of onset: 67) in his brother; Coronary artery disease (age of onset: 15) in his father.     Assessment/Plan:   Obstructive sleep apnea. - intolerant of CPAP - had hypoglossal nerve stimulator implated on 11/17/22 - device activation today - will need follow up in 4 to 6 weeks; if stable, then will be ready for titration study - okay for him to use CPAP with device for now until device settings are therapeutic  Time Spent Involved in Patient Care on Day of Examination:  38 minutes  Follow up:   Patient Instructions  Follow up in 4 to 6 weeks  Medication List:   Allergies as of 12/25/2022   No Known Allergies      Medication List        Accurate as of December 25, 2022  5:02 PM. If you have any questions, ask your nurse or doctor.          STOP taking these medications    hydrochlorothiazide 12.5 MG capsule Commonly known as: MICROZIDE Stopped by: Coralyn Helling       TAKE these medications    amLODipine 5 MG tablet Commonly known as: NORVASC Take 5 mg by mouth 2 (two) times daily. What changed: Another medication with the same name was removed. Continue taking this medication, and follow the  directions you see here. Changed by: Coralyn Helling   aspirin EC 81 MG tablet Take 81 mg by mouth daily. Swallow whole.   clopidogrel 75 MG tablet Commonly known as: PLAVIX Take 1 tablet (75 mg total) by mouth daily.   CoQ-10 100 MG Caps Take 100 mg by mouth daily.   diclofenac 75 MG EC tablet Commonly known as: VOLTAREN Take 75 mg by mouth 2 (two) times daily.   fenofibrate 160 MG tablet Take 160 mg by mouth daily.   FLAX SEEDS PO Take 1-2 capsules by mouth See admin instructions. Take 2 capsules in the morning and 1 capsule in the evening   glimepiride 4 MG tablet Commonly known as: AMARYL Take 4 mg by mouth 2 (two) times daily.   HYDROcodone-acetaminophen 10-325 MG tablet Commonly  known as: NORCO Take 1 tablet by mouth every 6 (six) hours as needed for moderate pain.   metFORMIN 1000 MG tablet Commonly known as: GLUCOPHAGE Take 1,000 mg by mouth 2 (two) times daily with a meal.   metoprolol tartrate 50 MG tablet Commonly known as: LOPRESSOR Take 50 mg by mouth 2 (two) times daily.   multivitamin with minerals Tabs tablet Take 1 tablet by mouth daily.   Oxycodone HCl 10 MG Tabs Take 10 mg by mouth daily as needed (breakthrough pain).   pantoprazole 40 MG tablet Commonly known as: PROTONIX TAKE 1 TABLET EVERY DAY   pravastatin 80 MG tablet Commonly known as: PRAVACHOL TAKE 1 TABLET EVERY DAY What changed: when to take this   ramipril 10 MG capsule Commonly known as: ALTACE Take 1 capsule (10 mg total) by mouth daily.   sildenafil 100 MG tablet Commonly known as: VIAGRA Take 100 mg by mouth daily as needed for erectile dysfunction.        Signature:  Coralyn Helling, MD [facility] Pulmonary/Critical Care Pager - [phone] 12/25/2022, 5:02 PM

## 2023-01-07 DIAGNOSIS — T63441A Toxic effect of venom of bees, accidental (unintentional), initial encounter: Secondary | ICD-10-CM

## 2023-01-07 DIAGNOSIS — T782XXA Anaphylactic shock, unspecified, initial encounter: Secondary | ICD-10-CM

## 2023-01-07 HISTORY — DX: Toxic effect of venom of bees, accidental (unintentional), initial encounter: T63.441A

## 2023-01-07 HISTORY — DX: Anaphylactic shock, unspecified, initial encounter: T78.2XXA

## 2023-01-08 DIAGNOSIS — E1129 Type 2 diabetes mellitus with other diabetic kidney complication: Secondary | ICD-10-CM | POA: Diagnosis not present

## 2023-01-08 DIAGNOSIS — G4733 Obstructive sleep apnea (adult) (pediatric): Secondary | ICD-10-CM | POA: Diagnosis not present

## 2023-01-08 DIAGNOSIS — I10 Essential (primary) hypertension: Secondary | ICD-10-CM | POA: Diagnosis not present

## 2023-01-08 DIAGNOSIS — M5412 Radiculopathy, cervical region: Secondary | ICD-10-CM | POA: Diagnosis not present

## 2023-01-08 DIAGNOSIS — M1991 Primary osteoarthritis, unspecified site: Secondary | ICD-10-CM | POA: Diagnosis not present

## 2023-01-08 DIAGNOSIS — Z0001 Encounter for general adult medical examination with abnormal findings: Secondary | ICD-10-CM | POA: Diagnosis not present

## 2023-01-08 DIAGNOSIS — Z1331 Encounter for screening for depression: Secondary | ICD-10-CM | POA: Diagnosis not present

## 2023-01-08 DIAGNOSIS — G894 Chronic pain syndrome: Secondary | ICD-10-CM | POA: Diagnosis not present

## 2023-01-08 DIAGNOSIS — Z125 Encounter for screening for malignant neoplasm of prostate: Secondary | ICD-10-CM | POA: Diagnosis not present

## 2023-01-10 ENCOUNTER — Ambulatory Visit
Admission: EM | Admit: 2023-01-10 | Discharge: 2023-01-10 | Disposition: A | Discharge status: Home / Self Care | Payer: Medicare HMO | Attending: Nurse Practitioner | Admitting: Nurse Practitioner

## 2023-01-10 ENCOUNTER — Emergency Department (HOSPITAL_COMMUNITY)
Admission: EM | Admit: 2023-01-10 | Discharge: 2023-01-10 | Disposition: A | Discharge status: Home / Self Care | Payer: Medicare HMO | Attending: Emergency Medicine | Admitting: Emergency Medicine

## 2023-01-10 ENCOUNTER — Encounter: Payer: Self-pay | Admitting: Emergency Medicine

## 2023-01-10 ENCOUNTER — Other Ambulatory Visit: Payer: Self-pay

## 2023-01-10 ENCOUNTER — Encounter (HOSPITAL_COMMUNITY): Payer: Self-pay | Admitting: Emergency Medicine

## 2023-01-10 DIAGNOSIS — T782XXA Anaphylactic shock, unspecified, initial encounter: Secondary | ICD-10-CM

## 2023-01-10 DIAGNOSIS — Z7982 Long term (current) use of aspirin: Secondary | ICD-10-CM | POA: Diagnosis not present

## 2023-01-10 DIAGNOSIS — R600 Localized edema: Secondary | ICD-10-CM

## 2023-01-10 DIAGNOSIS — R000 Tachycardia, unspecified: Secondary | ICD-10-CM | POA: Diagnosis not present

## 2023-01-10 DIAGNOSIS — E119 Type 2 diabetes mellitus without complications: Secondary | ICD-10-CM | POA: Diagnosis not present

## 2023-01-10 DIAGNOSIS — T7840XA Allergy, unspecified, initial encounter: Secondary | ICD-10-CM | POA: Insufficient documentation

## 2023-01-10 DIAGNOSIS — Z7902 Long term (current) use of antithrombotics/antiplatelets: Secondary | ICD-10-CM | POA: Diagnosis not present

## 2023-01-10 DIAGNOSIS — R062 Wheezing: Secondary | ICD-10-CM | POA: Diagnosis not present

## 2023-01-10 DIAGNOSIS — Z7984 Long term (current) use of oral hypoglycemic drugs: Secondary | ICD-10-CM | POA: Diagnosis not present

## 2023-01-10 DIAGNOSIS — R609 Edema, unspecified: Secondary | ICD-10-CM | POA: Diagnosis not present

## 2023-01-10 DIAGNOSIS — I10 Essential (primary) hypertension: Secondary | ICD-10-CM | POA: Diagnosis not present

## 2023-01-10 DIAGNOSIS — Z79899 Other long term (current) drug therapy: Secondary | ICD-10-CM | POA: Diagnosis not present

## 2023-01-10 DIAGNOSIS — R0602 Shortness of breath: Secondary | ICD-10-CM

## 2023-01-10 DIAGNOSIS — T783XXA Angioneurotic edema, initial encounter: Secondary | ICD-10-CM

## 2023-01-10 DIAGNOSIS — I4510 Unspecified right bundle-branch block: Secondary | ICD-10-CM | POA: Diagnosis not present

## 2023-01-10 LAB — TROPONIN I (HIGH SENSITIVITY)
Troponin I (High Sensitivity): 5 ng/L (ref <18)
Troponin I (High Sensitivity): 13 ng/L (ref <18)

## 2023-01-10 LAB — COMPREHENSIVE METABOLIC PANEL
ALT: 30 U/L (ref 0–44)
AST: 23 U/L (ref 15–41)
Albumin: 4.1 g/dL (ref 3.5–5.0)
Alkaline Phosphatase: 72 U/L (ref 38–126)
Anion gap: 11 (ref 5–15)
BUN: 27 mg/dL — ABNORMAL HIGH (ref 8–23)
CO2: 20 mmol/L — ABNORMAL LOW (ref 22–32)
Calcium: 9.4 mg/dL (ref 8.9–10.3)
Chloride: 102 mmol/L (ref 98–111)
Creatinine, Ser: 1.48 mg/dL — ABNORMAL HIGH (ref 0.61–1.24)
GFR, Estimated: 51 mL/min — ABNORMAL LOW (ref >60)
Glucose, Bld: 442 mg/dL — ABNORMAL HIGH (ref 70–99)
Potassium: 3.9 mmol/L (ref 3.5–5.1)
Sodium: 133 mmol/L — ABNORMAL LOW (ref 135–145)
Total Bilirubin: 0.6 mg/dL (ref 0.3–1.2)
Total Protein: 6.9 g/dL (ref 6.5–8.1)

## 2023-01-10 LAB — CBC WITH DIFFERENTIAL/PLATELET
Abs Immature Granulocytes: 0.07 10*3/uL (ref 0.00–0.07)
Basophils Absolute: 0.1 10*3/uL (ref 0.0–0.1)
Basophils Relative: 1 %
Eosinophils Absolute: 0.2 10*3/uL (ref 0.0–0.5)
Eosinophils Relative: 2 %
HCT: 39.3 % (ref 39.0–52.0)
Hemoglobin: 13.4 g/dL (ref 13.0–17.0)
Immature Granulocytes: 1 %
Lymphocytes Relative: 45 %
Lymphs Abs: 4.3 10*3/uL — ABNORMAL HIGH (ref 0.7–4.0)
MCH: 30.6 pg (ref 26.0–34.0)
MCHC: 34.1 g/dL (ref 30.0–36.0)
MCV: 89.7 fL (ref 80.0–100.0)
Monocytes Absolute: 0.8 10*3/uL (ref 0.1–1.0)
Monocytes Relative: 8 %
Neutro Abs: 4 10*3/uL (ref 1.7–7.7)
Neutrophils Relative %: 43 %
Platelets: 222 10*3/uL (ref 150–400)
RBC: 4.38 MIL/uL (ref 4.22–5.81)
RDW: 13.1 % (ref 11.5–15.5)
WBC: 9.5 10*3/uL (ref 4.0–10.5)
nRBC: 0 % (ref 0.0–0.2)

## 2023-01-10 MED ORDER — METHYLPREDNISOLONE SODIUM SUCC 125 MG IJ SOLR
125.0000 mg | Freq: Once | INTRAMUSCULAR | Status: AC
  Administered 2023-01-10: 125 mg via INTRAVENOUS

## 2023-01-10 MED ORDER — PREDNISONE 20 MG PO TABS: ORAL_TABLET | ORAL | 0 refills | Status: DC

## 2023-01-10 MED ORDER — SODIUM CHLORIDE 0.9 % IV BOLUS
500.0000 mL | Freq: Once | INTRAVENOUS | Status: AC
  Administered 2023-01-10: 500 mL via INTRAVENOUS

## 2023-01-10 MED ORDER — EPINEPHRINE 0.3 MG/0.3ML IJ SOAJ
0.3000 mg | Freq: Once | INTRAMUSCULAR | Status: AC
  Administered 2023-01-10: 0.3 mg via INTRAMUSCULAR

## 2023-01-10 MED ORDER — ACETAMINOPHEN 325 MG PO TABS
650.0000 mg | ORAL_TABLET | Freq: Once | ORAL | Status: AC
  Administered 2023-01-10: 650 mg via ORAL
  Filled 2023-01-10: qty 2

## 2023-01-10 MED ORDER — DIPHENHYDRAMINE HCL 50 MG/ML IJ SOLN
25.0000 mg | Freq: Once | INTRAMUSCULAR | Status: AC
  Administered 2023-01-10: 25 mg via INTRAVENOUS
  Filled 2023-01-10: qty 1

## 2023-01-10 MED ORDER — EPINEPHRINE 0.3 MG/0.3ML IJ SOAJ: 0.3000 mg | INTRAMUSCULAR | 1 refills | Status: AC | PRN

## 2023-01-10 MED ORDER — DIPHENHYDRAMINE HCL 50 MG/ML IJ SOLN: 50.0000 mg | Freq: Once | INTRAMUSCULAR | Status: DC

## 2023-01-10 MED ORDER — METOPROLOL TARTRATE 5 MG/5ML IV SOLN
5.0000 mg | Freq: Once | INTRAVENOUS | Status: AC
  Administered 2023-01-10: 5 mg via INTRAVENOUS
  Filled 2023-01-10: qty 5

## 2023-01-10 MED ORDER — DIPHENHYDRAMINE HCL 50 MG/ML IJ SOLN
50.0000 mg | Freq: Once | INTRAMUSCULAR | Status: AC
  Administered 2023-01-10: 50 mg via INTRAVENOUS

## 2023-01-10 MED ORDER — FAMOTIDINE 20 MG PO TABS: 20.0000 mg | ORAL_TABLET | Freq: Two times a day (BID) | ORAL | 0 refills | Status: DC

## 2023-01-10 MED ORDER — FAMOTIDINE IN NACL 20-0.9 MG/50ML-% IV SOLN
20.0000 mg | Freq: Once | INTRAVENOUS | Status: AC
  Administered 2023-01-10: 20 mg via INTRAVENOUS
  Filled 2023-01-10: qty 50

## 2023-01-10 NOTE — ED Notes (Signed)
Patient is being discharged from the Urgent Care and sent to the Emergency Department via [facility] . Per NP, patient is in need of higher level of care due to anaphylaxis reaction to bee stings. Patient is aware and verbalizes understanding of plan of care.  Vitals:   01/10/23 1445 01/10/23 1501  BP: 118/66   Pulse: (!) 112   Resp: 20   Temp: 98.2 F (36.8 C)   SpO2: (!) 87% 94%

## 2023-01-10 NOTE — ED Provider Notes (Signed)
[facility]PARTMENT AT [facility] Provider Note   CSN: [id number] Arrival date & time: 01/10/23  1516     History {Add pertinent medical, surgical, social history, OB history to HPI:1} Chief Complaint  Patient presents with   Allergic Reaction    Jonathan Shepard is a 72 y.o. male.  Patient has a history of hypertension and diabetes.  He was stung by bee and started having swelling to his face and some shortness of breath   Allergic Reaction      Home Medications Prior to Admission medications   Medication Sig Start Date End Date Taking? Authorizing Provider  EPINEPHrine 0.3 mg/0.3 mL IJ SOAJ injection Inject 0.3 mg into the muscle as needed for anaphylaxis. 01/10/23  Yes Bethann Berkshire, MD  famotidine (PEPCID) 20 MG tablet Take 1 tablet (20 mg total) by mouth 2 (two) times daily. 01/10/23  Yes Bethann Berkshire, MD  predniSONE (DELTASONE) 20 MG tablet 2 tabs po daily x 3 days 01/10/23  Yes Bethann Berkshire, MD  amLODipine (NORVASC) 5 MG tablet Take 5 mg by mouth 2 (two) times daily.    [provider]  aspirin EC 81 MG tablet Take 81 mg by mouth daily. Swallow whole.    [provider]  clopidogrel (PLAVIX) 75 MG tablet Take 1 tablet (75 mg total) by mouth daily. 05/10/19   Croitoru, Mihai, MD  Coenzyme Q10 (COQ-10) 100 MG CAPS Take 100 mg by mouth daily.    [provider]  diclofenac (VOLTAREN) 75 MG EC tablet Take 75 mg by mouth 2 (two) times daily. 09/24/22   [provider]  fenofibrate 160 MG tablet Take 160 mg by mouth daily.    [provider]  Flaxseed, Linseed, (FLAX SEEDS PO) Take 1-2 capsules by mouth See admin instructions. Take 2 capsules in the morning and 1 capsule in the evening    [provider]  glimepiride (AMARYL) 4 MG tablet Take 4 mg by mouth 2 (two) times daily.    [provider]  HYDROcodone-acetaminophen (NORCO) 10-325 MG tablet Take 1 tablet by mouth every 6 (six) hours as  needed for moderate pain. 02/22/18   [provider]  metFORMIN (GLUCOPHAGE) 1000 MG tablet Take 1,000 mg by mouth 2 (two) times daily with a meal.    [provider]  metoprolol tartrate (LOPRESSOR) 50 MG tablet Take 50 mg by mouth 2 (two) times daily.    [provider]  Multiple Vitamin (MULTIVITAMIN WITH MINERALS) TABS tablet Take 1 tablet by mouth daily.    [provider]  Oxycodone HCl 10 MG TABS Take 10 mg by mouth daily as needed (breakthrough pain). 07/08/18   [provider]  pantoprazole (PROTONIX) 40 MG tablet TAKE 1 TABLET EVERY DAY 05/10/18   Kilroy, Eda Paschal, PA-C  pravastatin (PRAVACHOL) 80 MG tablet TAKE 1 TABLET EVERY DAY Patient taking differently: Take 80 mg by mouth at bedtime. 06/09/22   Croitoru, Mihai, MD  ramipril (ALTACE) 10 MG capsule Take 1 capsule (10 mg total) by mouth daily. 07/30/22   Croitoru, Mihai, MD  sildenafil (VIAGRA) 100 MG tablet Take 100 mg by mouth daily as needed for erectile dysfunction. 09/18/22   [provider]      Allergies    Bee venom    Review of Systems   Review of Systems  Physical Exam Updated Vital Signs BP (!) 183/99 (BP Location: Right Arm)   Pulse (!) 105   Temp 98.1 F (36.7  C) (Oral)   Resp (!) 22   Ht 6' (1.829 m)   Wt 100.7 kg   SpO2 94%   BMI 30.11 kg/m  Physical Exam  ED Results / Procedures / Treatments   Labs (all labs ordered are listed, but only abnormal results are displayed) Labs Reviewed  CBC WITH DIFFERENTIAL/PLATELET - Abnormal; Notable for the following components:      Result Value   Lymphs Abs 4.3 (*)    All other components within normal limits  COMPREHENSIVE METABOLIC PANEL - Abnormal; Notable for the following components:   Sodium 133 (*)    CO2 20 (*)    Glucose, Bld 442 (*)    BUN 27 (*)    Creatinine, Ser 1.48 (*)    GFR, Estimated 51 (*)    All other components within normal limits  TROPONIN I (HIGH SENSITIVITY)  TROPONIN I (HIGH  SENSITIVITY)    EKG EKG Interpretation Date/Time:  "Sunday January 10 2023 15:19:36 EDT Ventricular Rate:  116 PR Interval:  147 QRS Duration:  127 QT Interval:  352 QTC Calculation: 489 R Axis:   230  Text Interpretation: Sinus tachycardia Probable left atrial enlargement Right bundle branch block Inferolateral infarct, age indeterminate Confirmed by ,  ([id number]) on 01/10/2023 4:36:03 PM  Radiology No results found.  Procedures Procedures  {Document cardiac monitor, telemetry assessment procedure when appropriate:1}  Medications Ordered in ED Medications  famotidine (PEPCID) IVPB 20 mg premix (0 mg Intravenous Stopped 01/10/23 1613)  sodium chloride 0.9 % bolus 500 mL (0 mLs Intravenous Stopped 01/10/23 1614)  acetaminophen (TYLENOL) tablet 650 mg (650 mg Oral Given 01/10/23 1618)  diphenhydrAMINE (BENADRYL) injection 25 mg (25 mg Intravenous Given 01/10/23 1758)  metoprolol tartrate (LOPRESSOR) injection 5 mg (5 mg Intravenous Given 01/10/23 1909)  acetaminophen (TYLENOL) tablet 650 mg (650 mg Oral Given 01/10/23 1937)  diphenhydrAMINE (BENADRYL) injection 25 mg (25 mg Intravenous Given 01/10/23 1938)    ED Course/ Medical Decision Making/ A&P   {   Click here for ABCD2, HEART and other calculatorsREFRESH Note before signing :1}                              Medical Decision Making Amount and/or Complexity of Data Reviewed Labs: ordered. ECG/medicine tests: ordered.  Risk OTC drugs. Prescription drug management.   Patient with allergic reaction.  Patient improved with epinephrine, steroids, Benadryl and Pepcid.  He will be discharged home with prednisone and Pepcid and follow-up with his PCP  {Document critical care time when appropriate:1} {Document review of labs and clinical decision tools ie heart score, Chads2Vasc2 etc:1}  {Document your independent review of radiology images, and any outside records:1} {Document your discussion with family members, caretakers, and  with consultants:1} {Document social determinants of health affecting pt's care:1} {Document your decision making why or why not admission, treatments were needed:1} Final Clinical Impression(s) / ED Diagnoses Final diagnoses:  Allergic reaction, initial encounter    Rx / DC Orders ED Discharge Orders          Ordered    famotidine (PEPCID) 20 MG tablet  2 times daily        01/10/23 2007    predniSONE (DELTASONE) 20 MG tablet        01/10/23 2007    EPINEPHrine 0.3 mg/0.3 mL IJ SOAJ injection  As needed        08" /04/24 2007

## 2023-01-10 NOTE — ED Provider Notes (Signed)
[facility]    CSN: [id number] Arrival date & time: 01/10/23  1441      History   Chief Complaint No chief complaint on file.   HPI Jonathan Shepard is a 72 y.o. male.   The history is provided by the patient and the spouse.   The patient presents after he was stung by a bee.  Patient was stung on the right ear and left eyelid.  Upon arrival, patient with moderate swelling to the right ear and left eyelid.  He also has a mild cough, and complains of chest tightness.  Patient also with swelling to his mouth.  Patient denies prior history of anaphylaxis, throat swelling, tongue swelling, wheezing, difficulty speaking, chest pain, nausea, vomiting, or diarrhea..  States he took Benadryl 25 mg prior to arrival.   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 Low risk Myoview 2011 12/07/2012   Non-insulin dependent type 2 diabetes mellitus (HCC) 12/07/2012   Dyslipidemia 12/07/2012   Family history of coronary artery disease 12/07/2012   Chest pain with moderate risk for cardiac etiology 12/07/2012    Past Surgical History:  Procedure Laterality Date   CARDIAC CATHETERIZATION N/A 04/14/2016   Procedure: Left Heart Cath and Coronary Angiography;  Surgeon: Peter M  Swazil[place], MD;  Location: [facility] INVASIVE CV LAB;  Service: Cardiovascular;  Laterality: N/A;   COLONOSCOPY WITH PROPOFOL N/A 01/09/2019   Procedure: COLONOSCOPY WITH PROPOFOL;  Surgeon: Corbin Ade, MD;  Location: [facility]O SUITE;  Service: Endoscopy;  Laterality: N/A;  8:30am   CORONARY ARTERY BYPASS GRAFT N/A 04/22/2016   Procedure: CORONARY ARTERY BYPASS GRAFTING (CABG)x4 with endoscopic harvesting of right saphenous vein SVG to diagonal SVG to PDA Left Radial artery to OM 2 LIMA to LAD;  Surgeon: Loreli Slot, MD;  Location: [facility] OR;  Service: Open Heart Surgery;  Laterality: N/A;   DRUG INDUCED ENDOSCOPY Bilateral 09/28/2022   Procedure: DRUG INDUCED ENDOSCOPY;  Surgeon: Christia Reading, MD;  Location: [facility] OR;  Service: ENT;  Laterality: Bilateral;   EYE SURGERY Bilateral 2023   cataract surgery   IMPLANTATION OF HYPOGLOSSAL NERVE STIMULATOR Right 11/17/2022   Procedure: IMPLANTATION OF HYPOGLOSSAL NERVE STIMULATOR;  Surgeon: Christia Reading, MD;  Location: [facility] OR;  Service: ENT;  Laterality: Right;   RADIAL ARTERY HARVEST Left 04/22/2016   Procedure: LEFT RADIAL ARTERY HARVEST;  Surgeon: Loreli Slot, MD;  Location: [facility] OR;  Service: Open Heart Surgery;  Laterality: Left;   TEE WITHOUT CARDIOVERSION N/A 04/22/2016   Procedure: TRANSESOPHAGEAL ECHOCARDIOGRAM (TEE);  Surgeon: Loreli Slot, MD;  Location: [facility] OR;  Service: Open Heart Surgery;  Laterality: N/A;       Home  Medications    Prior to Admission medications   Medication Sig Start Date End Date Taking? Authorizing Provider  amLODipine (NORVASC) 5 MG tablet Take 5 mg by mouth 2 (two) times daily.    [provider]  aspirin EC 81 MG tablet Take 81 mg by mouth daily. Swallow whole.    [provider]  clopidogrel (PLAVIX) 75 MG tablet Take 1 tablet (75 mg total) by mouth daily. 05/10/19   Croitoru, Mihai, MD  Coenzyme Q10 (COQ-10) 100 MG CAPS Take 100 mg by mouth daily.    [provider]   diclofenac (VOLTAREN) 75 MG EC tablet Take 75 mg by mouth 2 (two) times daily. 09/24/22   [provider]  fenofibrate 160 MG tablet Take 160 mg by mouth daily.    [provider]  Flaxseed, Linseed, (FLAX SEEDS PO) Take 1-2 capsules by mouth See admin instructions. Take 2 capsules in the morning and 1 capsule in the evening    [provider]  glimepiride (AMARYL) 4 MG tablet Take 4 mg by mouth 2 (two) times daily.    [provider]  HYDROcodone-acetaminophen (NORCO) 10-325 MG tablet Take 1 tablet by mouth every 6 (six) hours as needed for moderate pain. 02/22/18   [provider]  metFORMIN (GLUCOPHAGE) 1000 MG tablet Take 1,000 mg by mouth 2 (two) times daily with a meal.    [provider]  metoprolol tartrate (LOPRESSOR) 50 MG tablet Take 50 mg by mouth 2 (two) times daily.    [provider]  Multiple Vitamin (MULTIVITAMIN WITH MINERALS) TABS tablet Take 1 tablet by mouth daily.    [provider]  Oxycodone HCl 10 MG TABS Take 10 mg by mouth daily as needed (breakthrough pain). 07/08/18   [provider]  pantoprazole (PROTONIX) 40 MG tablet TAKE 1 TABLET EVERY DAY 05/10/18   Kilroy, Eda Paschal, PA-C  pravastatin (PRAVACHOL) 80 MG tablet TAKE 1 TABLET EVERY DAY Patient taking differently: Take 80 mg by mouth at bedtime. 06/09/22   Croitoru, Mihai, MD  ramipril (ALTACE) 10 MG capsule Take 1 capsule (10 mg total) by mouth daily. 07/30/22   Croitoru, Mihai, MD  sildenafil (VIAGRA) 100 MG tablet Take 100 mg by mouth daily as needed for erectile dysfunction. 09/18/22   [provider]    Family History Family History  Problem Relation Age of Onset   Coronary artery disease Father 8       CABG   Cancer Father    Coronary artery disease Brother 18       CABG   Colon cancer Neg Hx    Colon polyps Neg Hx     Social History Social History   Tobacco Use   Smoking status: Never   Smokeless tobacco: Never   Vaping Use   Vaping status: Never Used  Substance Use Topics   Alcohol use: No   Drug use: No     Allergies   Bee venom   Review of Systems Review of Systems Per HPI  Physical Exam Triage Vital Signs ED Triage Vitals  Encounter Vitals Group     BP 01/10/23 1445 118/66     Systolic BP Percentile --      Diastolic BP Percentile --      Pulse Rate 01/10/23 1445 (!) 112     Resp 01/10/23 1445 20     Temp 01/10/23 1445 98.2 F (36.8 C)     Temp Source 01/10/23 1445 Oral  SpO2 01/10/23 1445 (!) 87 %     Weight --      Height --      Head Circumference --      Peak Flow --      Pain Score 01/10/23 1502 7     Pain Loc --      Pain Education --      Exclude from Growth Chart --    No data found.  Updated Vital Signs BP 118/66 (BP Location: Right Arm)   Pulse (!) 112   Temp 98.2 F (36.8 C) (Oral)   Resp 20   SpO2 94%   Visual Acuity Right Eye Distance:   Left Eye Distance:   Bilateral Distance:    Right Eye Near:   Left Eye Near:    Bilateral Near:     Physical Exam Vitals and nursing note reviewed.  Constitutional:      General: He is in acute distress.     Appearance: He is diaphoretic.  HENT:     Head: Normocephalic.     Right Ear: Hearing normal. Swelling (Entire left ear) present.     Mouth/Throat:     Lips: Pink.     Mouth: Mucous membranes are moist. Angioedema present. No oral lesions.     Tongue: Tongue does not deviate from midline.     Pharynx: Oropharynx is clear. Uvula midline.     Comments: Airway is moderately obstructed by patient's tongue.  Patient with muffled voice. Eyes:     General: Vision grossly intact.     Extraocular Movements:     Right eye: Normal extraocular motion and no nystagmus.     Left eye: Normal extraocular motion and no nystagmus.     Conjunctiva/sclera:     Left eye: Left conjunctiva is not injected. No chemosis, exudate or hemorrhage.    Comments: Swelling noted to left upper eyelid.  Patient is able to  open and close his eyes.  Cardiovascular:     Rate and Rhythm: Tachycardia present.     Pulses: Normal pulses.     Heart sounds: Normal heart sounds.  Pulmonary:     Effort: Respiratory distress (mild) present.     Breath sounds: Normal breath sounds. No stridor. No wheezing, rhonchi or rales.  Abdominal:     General: Bowel sounds are normal.     Palpations: Abdomen is soft.     Tenderness: There is no abdominal tenderness.  Musculoskeletal:     Cervical back: Normal range of motion.  Skin:    General: Skin is warm.  Neurological:     General: No focal deficit present.     Mental Status: He is alert and oriented to person, place, and time.  Psychiatric:        Mood and Affect: Mood normal.        Behavior: Behavior normal.      UC Treatments / Results  Labs (all labs ordered are listed, but only abnormal results are displayed) Labs Reviewed - No data to display  EKG   Radiology No results found.  Procedures Procedures (including critical care time)  Medications Ordered in UC Medications  diphenhydrAMINE (BENADRYL) injection 50 mg (has no administration in time range)  methylPREDNISolone sodium succinate (SOLU-MEDROL) 125 mg/2 mL injection 125 mg (125 mg Intravenous Given 01/10/23 1454)  diphenhydrAMINE (BENADRYL) injection 50 mg (50 mg Intravenous Given 01/10/23 1457)  EPINEPHrine (EPI-PEN) injection 0.3 mg (0.3 mg Intramuscular Given 01/10/23 1559)    Initial Impression /  Assessment and Plan / UC Course  I have reviewed the triage vital signs and the nursing notes.  Pertinent labs & imaging results that were available during my care of the patient were reviewed by me and considered in my medical decision making (see chart for details).  Patient brought in by his spouse after he was stung by wasp on the right ear and left eyelid.  Patient with moderate facial/angioedema.  Room air sat upon arrival was at 89%.  Patient denies tongue swelling, throat closing, but does  admit to shortness of breath.  Patient with dry cough and diaphoresis upon presentation, that worsened with complaints of chest tightness.  Patient's voice also became more muffled.  EMS called for transport to the emergency department.  In the interim, number 20-gauge PIV was established in the right forearm, Solu-Medrol 125 mg given IVP, Benadryl 50 mg IVP, and epinephrine 0.3 mg IM.  O2 at 2 L via nasal cannula was also applied.  EMS present, for transport to the emergency department for further evaluation.  Final Clinical Impressions(s) / UC Diagnoses   Final diagnoses:  None   Discharge Instructions   None    ED Prescriptions   None    PDMP not reviewed this encounter.   Abran Cantor, NP 01/10/23 1554

## 2023-01-10 NOTE — ED Notes (Signed)
Repeat trop drawn Pain in face 8/10 Pt using urinal at bedside CP 1/10

## 2023-01-10 NOTE — Discharge Instructions (Signed)
Patient transported to the emergency department via EMS for anaphylaxis.

## 2023-01-10 NOTE — ED Notes (Signed)
Pt stated that he has been out of BP meds x1 weeks Waiting on mail order Pt stated BP has been high for 6 months

## 2023-01-10 NOTE — ED Triage Notes (Signed)
Pt stung x2 45 mins ago. Pt was sent over by urgent care. Ems gave pt 125mg  of solumedrol, 50mg  benadryl and 1 albuterol. Pt is now c/o throat closing and chest pain.

## 2023-01-10 NOTE — ED Notes (Signed)
[facility] here to transport pt. °

## 2023-01-10 NOTE — ED Notes (Signed)
Medicated per MAR Pt denies SOB Warm blanket given

## 2023-01-10 NOTE — ED Triage Notes (Signed)
Was stung by a bee on right ear and left eyelid today.  Took benadryl 15 minutes ago/

## 2023-01-10 NOTE — Discharge Instructions (Signed)
Take Benadryl 25 mg every 4-6 hours for swelling or itching.  Pick up the prednisone and Pepcid tomorrow and start taking it.  Make sure you take your normal medicines this evening.  Return if any problems.  Follow-up with your family doctor next week for recheck and possible scheduling for allergy testing

## 2023-01-18 ENCOUNTER — Telehealth: Payer: Self-pay

## 2023-01-18 NOTE — Telephone Encounter (Signed)
Transition Care Management Unsuccessful Follow-up Telephone Call  Date of discharge and from where:  [facility]  Attempts:  1st Attempt  Reason for unsuccessful TCM follow-up call:  No answer/busy   Lenard Forth [facility] Guide, [facility] [phone] [address] [place], [place], [place] [address] Phone: [phone] Email: [email or web address]

## 2023-01-19 ENCOUNTER — Telehealth: Payer: Self-pay

## 2023-01-19 NOTE — Telephone Encounter (Signed)
Transition Care Management Unsuccessful Follow-up Telephone Call  Date of discharge and from where:  [facility]  Attempts:  2nd Attempt  Reason for unsuccessful TCM follow-up call:  No answer/busy   Lenard Forth [facility] Guide, [facility] [phone] [address] [place], [place], [place] [address] Phone: [phone] Email: [email or web address]

## 2023-02-01 ENCOUNTER — Encounter: Payer: Self-pay | Admitting: Pulmonary Disease

## 2023-02-10 ENCOUNTER — Ambulatory Visit (HOSPITAL_BASED_OUTPATIENT_CLINIC_OR_DEPARTMENT_OTHER): Payer: Medicare HMO | Admitting: Pulmonary Disease

## 2023-02-26 ENCOUNTER — Ambulatory Visit (HOSPITAL_BASED_OUTPATIENT_CLINIC_OR_DEPARTMENT_OTHER): Payer: Medicare HMO | Admitting: Pulmonary Disease

## 2023-02-26 ENCOUNTER — Encounter (HOSPITAL_BASED_OUTPATIENT_CLINIC_OR_DEPARTMENT_OTHER): Payer: Self-pay | Admitting: Pulmonary Disease

## 2023-02-26 VITALS — BP 128/68 | HR 73 | Resp 16 | Ht 72.0 in | Wt 213.4 lb

## 2023-02-26 DIAGNOSIS — G4733 Obstructive sleep apnea (adult) (pediatric): Secondary | ICD-10-CM

## 2023-02-26 NOTE — Progress Notes (Signed)
[facility]  Chief Complaint  Patient presents with   Follow-up    Post Op Insp[facility]    Past Surgical History:  He  has a past surgical history that includes Cardiac catheterization (N/A, 04/14/2016); Coronary artery bypass graft (N/A, 04/22/2016); TEE without cardioversion (N/A, 04/22/2016); Radial artery harvest (Left, 04/22/2016); Colonoscopy with propofol (N/A, 01/09/2019); Drug induced endoscopy (Bilateral, 09/28/2022); Eye surgery (Bilateral, 2023); and Implantation of hypoglossal nerve stimulator (Right, 11/17/2022).  Past Medical History:  Arthritis, CAD, DM, GERD, Nephrolithiasis, HLD, HTN, Bee sting anaphylaxis  Constitutional:  BP 128/68   Pulse 73   Resp 16   Ht 6' (1.829 m)   Wt 213 lb 6.4 oz (96.8 kg)   SpO2 97%   BMI 28.94 kg/m   Brief Summary:  Jonathan Shepard is a 72 y.o. male with obstructive sleep apnea.  He has a hypoglossal nerve stimulator.      Subjective:   He is here with his wife.  He had anaphylactic reaction to a bee sting in August.  Has an epi-pen now.  He has HGN stimulator set between 0.4 to 0.5 volts.  He wasn't sure why the device would keep working while he was awake.  His wife says he still has episodes of snoring and apnea.  He isn't using his CPAP anymore.  He has been getting sinus congestion, post nasal drip and some sore throat.  No trouble with speech or swallowing.  Physical Exam:   Appearance - well kempt   ENMT - no sinus tenderness, no oral exudate, no LAN, Mallampati 3 airway, no stridor  Respiratory - equal breath sounds bilaterally, no wheezing or rales  CV - s1s2 regular rate and rhythm, no murmurs  Ext - no clubbing, no edema  Skin - no rashes  Psych - normal mood and affect   Sleep Tests:  HST January 2023 >> AHI 23.3  Social History:  He  reports that he has never smoked. He has been exposed to tobacco smoke. He has never used smokeless tobacco. He reports that  he does not drink alcohol and does not use drugs.  Family History:  His family history includes Cancer in his father; Coronary artery disease (age of onset: 51) in his brother; Coronary artery disease (age of onset: 49) in his father.     Assessment/Plan:   Obstructive sleep apnea. - intolerant of CPAP - had hypoglossal nerve stimulator implated on 11/17/22 - device activated 12/25/22 - will arrange for in lab titration study to set therapy mode  Time Spent Involved in Patient Care on Day of Examination:  26 minutes  Follow up:   Patient Instructions  Will arrange for an in-lab Inspire titration sleep study Will call to arrange for follow up after sleep study reviewed  Medication List:   Allergies as of 02/26/2023       Reactions   Bee Venom         Medication List        Accurate as of February 26, 2023  8:54 AM. If you have any questions, ask your nurse or doctor.          STOP taking these medications    HYDROcodone-acetaminophen 10-325 MG tablet Commonly known as: NORCO Stopped by: Coralyn Helling   predniSONE 20 MG tablet Commonly known as: DELTASONE Stopped by: Coralyn Helling   sildenafil 100 MG tablet Commonly known as: VIAGRA Stopped by: Coralyn Helling       TAKE  these medications    amLODipine 5 MG tablet Commonly known as: NORVASC Take 5 mg by mouth 2 (two) times daily.   aspirin EC 81 MG tablet Take 81 mg by mouth daily. Swallow whole.   clopidogrel 75 MG tablet Commonly known as: PLAVIX Take 1 tablet (75 mg total) by mouth daily.   CoQ-10 100 MG Caps Take 100 mg by mouth daily.   diclofenac 75 MG EC tablet Commonly known as: VOLTAREN Take 75 mg by mouth 2 (two) times daily.   EPINEPHrine 0.3 mg/0.3 mL Soaj injection Commonly known as: EPI-PEN Inject 0.3 mg into the muscle as needed for anaphylaxis.   famotidine 20 MG tablet Commonly known as: Pepcid Take 1 tablet (20 mg total) by mouth 2 (two) times daily.   fenofibrate 160 MG  tablet Take 160 mg by mouth daily.   FLAX SEEDS PO Take 1-2 capsules by mouth See admin instructions. Take 2 capsules in the morning and 1 capsule in the evening   glimepiride 4 MG tablet Commonly known as: AMARYL Take 4 mg by mouth 2 (two) times daily.   metFORMIN 1000 MG tablet Commonly known as: GLUCOPHAGE Take 1,000 mg by mouth 2 (two) times daily with a meal.   metoprolol tartrate 50 MG tablet Commonly known as: LOPRESSOR Take 50 mg by mouth 2 (two) times daily.   multivitamin with minerals Tabs tablet Take 1 tablet by mouth daily.   Oxycodone HCl 10 MG Tabs Take 10 mg by mouth daily as needed (breakthrough pain).   pantoprazole 40 MG tablet Commonly known as: PROTONIX TAKE 1 TABLET EVERY DAY   pravastatin 80 MG tablet Commonly known as: PRAVACHOL TAKE 1 TABLET EVERY DAY What changed: when to take this   ramipril 10 MG capsule Commonly known as: ALTACE Take 1 capsule (10 mg total) by mouth daily.        Signature:  Coralyn Helling, MD [facility] Pulmonary/Critical Care Pager - [phone] 02/26/2023, 8:54 AM

## 2023-02-26 NOTE — Patient Instructions (Signed)
Will arrange for an in lab Inspire titration sleep study Will call to arrange for follow up after sleep study reviewed

## 2023-03-01 DIAGNOSIS — I10 Essential (primary) hypertension: Secondary | ICD-10-CM | POA: Diagnosis not present

## 2023-03-01 DIAGNOSIS — M1991 Primary osteoarthritis, unspecified site: Secondary | ICD-10-CM | POA: Diagnosis not present

## 2023-03-01 DIAGNOSIS — E1129 Type 2 diabetes mellitus with other diabetic kidney complication: Secondary | ICD-10-CM | POA: Diagnosis not present

## 2023-03-01 DIAGNOSIS — G894 Chronic pain syndrome: Secondary | ICD-10-CM | POA: Diagnosis not present

## 2023-03-10 DIAGNOSIS — Z0001 Encounter for general adult medical examination with abnormal findings: Secondary | ICD-10-CM | POA: Diagnosis not present

## 2023-03-10 DIAGNOSIS — D519 Vitamin B12 deficiency anemia, unspecified: Secondary | ICD-10-CM | POA: Diagnosis not present

## 2023-03-10 DIAGNOSIS — Z125 Encounter for screening for malignant neoplasm of prostate: Secondary | ICD-10-CM | POA: Diagnosis not present

## 2023-03-10 DIAGNOSIS — E559 Vitamin D deficiency, unspecified: Secondary | ICD-10-CM | POA: Diagnosis not present

## 2023-03-10 DIAGNOSIS — R5383 Other fatigue: Secondary | ICD-10-CM | POA: Diagnosis not present

## 2023-03-10 DIAGNOSIS — I10 Essential (primary) hypertension: Secondary | ICD-10-CM | POA: Diagnosis not present

## 2023-03-10 DIAGNOSIS — E1129 Type 2 diabetes mellitus with other diabetic kidney complication: Secondary | ICD-10-CM | POA: Diagnosis not present

## 2023-03-10 DIAGNOSIS — D649 Anemia, unspecified: Secondary | ICD-10-CM | POA: Diagnosis not present

## 2023-03-31 ENCOUNTER — Other Ambulatory Visit: Payer: Self-pay | Admitting: Cardiovascular Disease

## 2023-04-05 ENCOUNTER — Encounter (HOSPITAL_BASED_OUTPATIENT_CLINIC_OR_DEPARTMENT_OTHER): Payer: Medicare HMO | Admitting: Pulmonary Disease

## 2023-04-05 ENCOUNTER — Ambulatory Visit (HOSPITAL_BASED_OUTPATIENT_CLINIC_OR_DEPARTMENT_OTHER): Payer: Medicare HMO | Attending: Pulmonary Disease | Admitting: Pulmonary Disease

## 2023-04-05 DIAGNOSIS — G4733 Obstructive sleep apnea (adult) (pediatric): Secondary | ICD-10-CM | POA: Insufficient documentation

## 2023-04-06 ENCOUNTER — Telehealth: Payer: Self-pay | Admitting: Pulmonary Disease

## 2023-04-06 NOTE — Telephone Encounter (Signed)
VS pt Received a call from sleep tech prior to sleep study that patient has presented for the study, usage is 51 hours/week but he is not feeling any benefit from the device.  Tech went ahead of that study, unable to find therapeutic amplitude.  He has been decreased to 0.4 to 0.5 V due to hyperstimulation and he seemed to have residual events on 1.3 so may need higher amplitude.  He needs an office visit set up with me/APP ASAP to discuss and provide some more education and training and reset expectations about the inspire device.

## 2023-04-06 NOTE — Telephone Encounter (Signed)
This patient was previously scheduled with you on 04/21/23.  Is this soon enough or do you want me to double book on your schedule somewhere this week?

## 2023-04-09 DIAGNOSIS — G4733 Obstructive sleep apnea (adult) (pediatric): Secondary | ICD-10-CM

## 2023-04-09 NOTE — Procedures (Signed)
Patient Name: Jonathan Shepard, Jonathan Shepard Date: 04/05/2023 Gender: Male D.O.B: Apr 29, 1951 Age (years): 76 Referring Provider: Coralyn Helling MD, ABSM Height (inches): 72 Interpreting Physician: Cyril Mourning MD, ABSM Weight (lbs): 215 RPSGT: Ulyess Mort BMI: 29 MRN: [id number] Neck Size: 18.00 <br> <br> CLINICAL INFORMATION The patient is referred for an inspire titration to treat sleep apnea.    HST January 2023 >> AHI 23.3  SLEEP STUDY TECHNIQUE As per the AASM Manual for the Scoring of Sleep and Associated Events v2.3 (April 2016) with a hypopnea requiring 4% desaturations.  The channels recorded and monitored were frontal, central and occipital EEG, electrooculogram (EOG), submentalis EMG (chin), nasal and oral airflow, thoracic and abdominal wall motion, anterior tibialis EMG, snore microphone, electrocardiogram, and pulse oximetry. Continuous positive airway pressure (CPAP) was initiated at the beginning of the study and titrated to treat sleep-disordered breathing.  MEDICATIONS Medications self-administered by patient taken the night of the study : N/A  RESPIRATORY PARAMETERS Optimal amplitude (V): 1.3 AHI at Optimal amp (/hr): 20.3 Overall Minimal O2 (%): 85.0 Supine % at Optimal Pressure (%): 0 Minimal O2 at Optimal amp (%): 91.0   SLEEP ARCHITECTURE The study was initiated at 10:25:52 PM and ended at 5:15:23 AM.  Sleep onset time was 9.1 minutes and the sleep efficiency was 77.4%. The total sleep time was 317 minutes.  The patient spent 18.3% of the night in stage N1 sleep, 68.9% in stage N2 sleep, 0.0% in stage N3 and 12.8% in REM.Stage REM latency was 169.5 minutes  Wake after sleep onset was 83.4. Alpha intrusion was absent. Supine sleep was 1.10%.  CARDIAC DATA The 2 lead EKG demonstrated sinus rhythm. The mean heart rate was 69.1 beats per minute. Other EKG findings include: None.   LEG MOVEMENT DATA The total Periodic Limb Movements of Sleep (PLMS) were 0. The  PLMS index was 0.0. A PLMS index of <15 is considered normal in adults.  IMPRESSIONS - The optimal therapeutic amplitude was not determined. Events were lowest at 0.5 V & again at 1.3 V of the range that was tried.  - Moderate oxygen desaturations were observed during this titration (min O2 = 85.0%). - The patient snored with loud snoring volume during this titration study. - No cardiac abnormalities were observed during this study. - Clinically significant periodic limb movements were not noted during this study. Arousals associated with PLMs were rare.   DIAGNOSIS - Obstructive Sleep Apnea (G47.33)   RECOMMENDATIONS - May need further titration of device as outpatient to see if patient will tolerate higher amplitude . incoming amplitude was 0.5V & he has been unable to tolerate higher.Alternatively, may need to change electrode configuration - Avoid alcohol, sedatives and other CNS depressants that may worsen sleep apnea and disrupt normal sleep architecture. - Sleep hygiene should be reviewed to assess factors that may improve sleep quality. - Weight management and regular exercise should be initiated or continued. - Return to [facility] Center for re-evaluation after 4 weeks of therapy  [Electronically signed] 04/09/2023 10:49 AM  Cyril Mourning MD, ABSM Diplomate, American Board of Sleep Medicine NPI: [id number]

## 2023-04-21 ENCOUNTER — Ambulatory Visit (HOSPITAL_BASED_OUTPATIENT_CLINIC_OR_DEPARTMENT_OTHER): Payer: Medicare HMO | Admitting: Pulmonary Disease

## 2023-04-21 ENCOUNTER — Encounter (HOSPITAL_BASED_OUTPATIENT_CLINIC_OR_DEPARTMENT_OTHER): Payer: Self-pay | Admitting: Pulmonary Disease

## 2023-04-21 VITALS — BP 128/68 | HR 64 | Resp 14 | Ht 72.0 in | Wt 216.8 lb

## 2023-04-21 DIAGNOSIS — G4733 Obstructive sleep apnea (adult) (pediatric): Secondary | ICD-10-CM | POA: Insufficient documentation

## 2023-04-21 NOTE — Patient Instructions (Signed)
New settings Satrt delay 13m Pause 29m Duration 8h  ELectrode C STart at level 2 = 0.5 V Increase by 1 level every 2 weeks

## 2023-04-21 NOTE — Assessment & Plan Note (Signed)
Hypoglossal nerve stimulator was reassessed today.  Goal of the follow-up visit was to ensure good compliance, good subjective benefit, good tongue motion and good sense lead waveforms .incision sites appear good, tongue protrusion was examined.  Download was reviewed and usage appears to be up to 8 hours average per night.  Spouse reports that snoring is decreased and he has more energy.  He is at level 4 = 0.8V .  It will be difficult to achieve therapeutic amplitude with this electrode configuration A (+-+) Programming :  1.  We checked electrode configuration be with a range of 0.3-0.5 and configuration C with a range of 0.4-0.8.  stimulation level : Sensation 0.4 V , functional level 0.4 V lower limit, upper limit 0.8 V 2.  Start delay at 45 minutes, pause time increased to 30 minutes, duration 9 hours 3.  Sensing waveform was analyzed for 3 minutes 4.  Sleep remote education was provided patient demonstrated competency with the remote and was aware of patient Instruction videos and sleep remote guide 5.  Patient was instructed to step up levels by 1 level (0.1 V ) every 2 week 6. Download was reviewed to confirm good compliance with the device Subjective feedback about snoring will be obtained from wife on future visit.  If he is able to achieve therapeutic efficacy with 0.8 V, then we can perform home sleep test  at this level

## 2023-04-21 NOTE — Progress Notes (Signed)
   Subjective:    Patient ID: STEN BASCH, male    DOB: 1950-09-14, 72 y.o.   MRN: [id number]  HPI 72 year old man for follow-up of OSA.  PMH : CAD, DM, GERD, Nephrolithiasis, HLD, HTN   He was CPAP intolerant and had hypoglossal nerve stimulator implanted on 11/17/22 [name]) - device activated 12/25/22 Start delay 45 minutes -pause time 15 minutes -duration 8 hours  02/2023 OV >>between 0.4 to 0.5 volts  Pulse width 60, rate 40  He presents for post study follow-up visit.  After his study he was returned to incoming amplitude of 0.7 V = Level 3.  He increased to 0.8 V and presents at that level today.  Snoring is still being noted by his wife.  He needs a longer pause time.  He is turning the device off on a couple of nights.  His download was reviewed Wife feels that he is better rested, however he does not feel much different   Significant tests/ events reviewed  Inspire titration 03/2023 >> incoming amplitude 0.7, titrated between 0.5-1.3 V , events noted at all levels, no therapeutic amplitude identified HST January 2023 >> AHI 23.3   Review of Systems neg for any significant sore throat, dysphagia, itching, sneezing, nasal congestion or excess/ purulent secretions, fever, chills, sweats, unintended wt loss, pleuritic or exertional cp, hempoptysis, orthopnea pnd or change in chronic leg swelling. Also denies presyncope, palpitations, heartburn, abdominal pain, nausea, vomiting, diarrhea or change in bowel or urinary habits, dysuria,hematuria, rash, arthralgias, visual complaints, headache, numbness weakness or ataxia.     Objective:   Physical Exam  Gen. Pleasant, obese, in no distress ENT - no lesions, no post nasal drip Neck: No JVD, no thyromegaly, no carotid bruits Lungs: no use of accessory muscles, no dullness to percussion, decreased without rales or rhonchi  Cardiovascular: Rhythm regular, heart sounds  normal, no murmurs or gallops, no peripheral  edema Musculoskeletal: No deformities, no cyanosis or clubbing , no tremors       Assessment & Plan:

## 2023-04-23 DIAGNOSIS — M4802 Spinal stenosis, cervical region: Secondary | ICD-10-CM | POA: Diagnosis not present

## 2023-04-23 DIAGNOSIS — M5412 Radiculopathy, cervical region: Secondary | ICD-10-CM | POA: Diagnosis not present

## 2023-04-28 DIAGNOSIS — M1991 Primary osteoarthritis, unspecified site: Secondary | ICD-10-CM | POA: Diagnosis not present

## 2023-04-28 DIAGNOSIS — I10 Essential (primary) hypertension: Secondary | ICD-10-CM | POA: Diagnosis not present

## 2023-04-28 DIAGNOSIS — G894 Chronic pain syndrome: Secondary | ICD-10-CM | POA: Diagnosis not present

## 2023-04-28 DIAGNOSIS — E1129 Type 2 diabetes mellitus with other diabetic kidney complication: Secondary | ICD-10-CM | POA: Diagnosis not present

## 2023-06-15 DIAGNOSIS — M5412 Radiculopathy, cervical region: Secondary | ICD-10-CM | POA: Diagnosis not present

## 2023-06-15 DIAGNOSIS — M4802 Spinal stenosis, cervical region: Secondary | ICD-10-CM | POA: Diagnosis not present

## 2023-06-22 ENCOUNTER — Ambulatory Visit (HOSPITAL_BASED_OUTPATIENT_CLINIC_OR_DEPARTMENT_OTHER): Payer: Medicare HMO | Admitting: Pulmonary Disease

## 2023-06-22 ENCOUNTER — Encounter (HOSPITAL_BASED_OUTPATIENT_CLINIC_OR_DEPARTMENT_OTHER): Payer: Self-pay | Admitting: Pulmonary Disease

## 2023-06-22 VITALS — BP 144/78 | HR 75 | Resp 20 | Ht 72.0 in | Wt 214.2 lb

## 2023-06-22 DIAGNOSIS — G4733 Obstructive sleep apnea (adult) (pediatric): Secondary | ICD-10-CM | POA: Diagnosis not present

## 2023-06-22 NOTE — Progress Notes (Signed)
   Subjective:    Patient ID: Jonathan Shepard, male    DOB: [birth year], 73 y.o.   MRN: [id number]  HPI  73 yo  man for follow-up of OSA.   PMH : CAD, DM, GERD, Nephrolithiasis, HLD, HTN    He was CPAP intolerant and had hypoglossal nerve stimulator implanted on 11/17/22 [name]) - device activated 12/25/22 Start delay 45 minutes -pause time 15 minutes -duration 8 hours   02/2023 OV >>between 0.4 to 0.5 volts  Pulse width 60, rate 40   He is at level 4 = 0.8V .  It will be difficult to achieve therapeutic amplitude with this electrode configuration A (+-+) We checked electrode configuration B with a range of 0.3-0.5 and configuration C with a range of 0.4-0.8.  Sensation 0.4 V , functional level 0.4 V lower limit, upper limit 0.8 V  Start delay at 45 minutes, pause time increased to 30 minutes, duration 9 hours     Significant tests/ events reviewed   Inspire titration 03/2023 >> incoming amplitude 0.7, titrated between 0.5-1.3 V , events noted at all levels, no therapeutic amplitude identified HST January 2023 >> AHI 23.3   Review of Systems neg for any significant sore throat, dysphagia, itching, sneezing, nasal congestion or excess/ purulent secretions, fever, chills, sweats, unintended wt loss, pleuritic or exertional cp, hempoptysis, orthopnea pnd or change in chronic leg swelling. Also denies presyncope, palpitations, heartburn, abdominal pain, nausea, vomiting, diarrhea or change in bowel or urinary habits, dysuria,hematuria, rash, arthralgias, visual complaints, headache, numbness weakness or ataxia.     Objective:   Physical Exam  Gen. Pleasant, well-nourished, in no distress ENT - no thrush, no pallor/icterus,no post nasal drip, tongue protrusion checked,  Neck: No JVD, no thyromegaly, no carotid bruits Lungs: no use of accessory muscles, no dullness to percussion, clear without rales or rhonchi  Cardiovascular: Rhythm regular, heart sounds  normal, no murmurs or  gallops, no peripheral edema Musculoskeletal: No deformities, no cyanosis or clubbing        Assessment & Plan:    OSA  -he feels much improved compared to baseline and compared to his CPAP experience. We checked him on various alternate electrode configurations.  We were not able to achieve therapeutic amplitude on electrode A. On B & D configurations, he had good tongue motion but not enough for range. B seems to be the best, his incoming amplitude was 0.6 V and we will check a home sleep test on current setting  Hypoglossal nerve stimulator was reassessed today.  Goal of the follow-up visit was to ensure good compliance, good subjective benefit, good tongue motion and good sense lead waveforms .incision sites appear good, tongue protrusion was examined.  Download was reviewed and usage appears to be up to 7 hours average per night.   Programming :  1.  Stimulation level : range 0.4-0.8, electrode B, current level 0.6 2.   Sensing waveform was analyzed for 3 minutes 4.  Sleep remote education was provided patient demonstrated competency with the remote and was aware of patient Instruction videos and sleep remote guide 5.  Download was reviewed to confirm good compliance with the device

## 2023-06-22 NOTE — Patient Instructions (Signed)
 X Home sleep test on current setting

## 2023-06-24 ENCOUNTER — Ambulatory Visit: Payer: Medicare HMO

## 2023-06-24 DIAGNOSIS — G4733 Obstructive sleep apnea (adult) (pediatric): Secondary | ICD-10-CM

## 2023-06-30 DIAGNOSIS — Z6831 Body mass index (BMI) 31.0-31.9, adult: Secondary | ICD-10-CM | POA: Diagnosis not present

## 2023-06-30 DIAGNOSIS — G894 Chronic pain syndrome: Secondary | ICD-10-CM | POA: Diagnosis not present

## 2023-06-30 DIAGNOSIS — I10 Essential (primary) hypertension: Secondary | ICD-10-CM | POA: Diagnosis not present

## 2023-06-30 DIAGNOSIS — E1129 Type 2 diabetes mellitus with other diabetic kidney complication: Secondary | ICD-10-CM | POA: Diagnosis not present

## 2023-06-30 DIAGNOSIS — E6609 Other obesity due to excess calories: Secondary | ICD-10-CM | POA: Diagnosis not present

## 2023-06-30 DIAGNOSIS — L4050 Arthropathic psoriasis, unspecified: Secondary | ICD-10-CM | POA: Diagnosis not present

## 2023-06-30 DIAGNOSIS — G4733 Obstructive sleep apnea (adult) (pediatric): Secondary | ICD-10-CM | POA: Diagnosis not present

## 2023-06-30 DIAGNOSIS — M1991 Primary osteoarthritis, unspecified site: Secondary | ICD-10-CM | POA: Diagnosis not present

## 2023-06-30 DIAGNOSIS — E1165 Type 2 diabetes mellitus with hyperglycemia: Secondary | ICD-10-CM | POA: Diagnosis not present

## 2023-07-07 ENCOUNTER — Telehealth: Payer: Self-pay | Admitting: Pulmonary Disease

## 2023-07-07 NOTE — Telephone Encounter (Signed)
Home sleep study showed significant events with AHI 30/hour. This was supposed to have been performed on inspire, electrode B configuration amp.  0.6 V  Please confirm from patient and by download that device was on, for the night of the study. If so, please arrange follow-up office visit to discuss next steps with inspire rep present

## 2023-07-28 NOTE — Telephone Encounter (Signed)
 Verified with pt & with download that device was on while completing HST.  Pt scheduled for 3/12 at 9:00 AM

## 2023-08-10 DIAGNOSIS — E663 Overweight: Secondary | ICD-10-CM | POA: Diagnosis not present

## 2023-08-10 DIAGNOSIS — J019 Acute sinusitis, unspecified: Secondary | ICD-10-CM | POA: Diagnosis not present

## 2023-08-10 DIAGNOSIS — Z6829 Body mass index (BMI) 29.0-29.9, adult: Secondary | ICD-10-CM | POA: Diagnosis not present

## 2023-08-10 DIAGNOSIS — R030 Elevated blood-pressure reading, without diagnosis of hypertension: Secondary | ICD-10-CM | POA: Diagnosis not present

## 2023-08-17 DIAGNOSIS — I10 Essential (primary) hypertension: Secondary | ICD-10-CM | POA: Diagnosis not present

## 2023-08-17 DIAGNOSIS — G894 Chronic pain syndrome: Secondary | ICD-10-CM | POA: Diagnosis not present

## 2023-08-17 DIAGNOSIS — M5412 Radiculopathy, cervical region: Secondary | ICD-10-CM | POA: Diagnosis not present

## 2023-08-17 DIAGNOSIS — E1165 Type 2 diabetes mellitus with hyperglycemia: Secondary | ICD-10-CM | POA: Diagnosis not present

## 2023-08-18 ENCOUNTER — Encounter (HOSPITAL_BASED_OUTPATIENT_CLINIC_OR_DEPARTMENT_OTHER): Payer: Self-pay | Admitting: Pulmonary Disease

## 2023-08-18 ENCOUNTER — Ambulatory Visit (HOSPITAL_BASED_OUTPATIENT_CLINIC_OR_DEPARTMENT_OTHER): Payer: Medicare HMO | Admitting: Pulmonary Disease

## 2023-08-18 VITALS — BP 132/64 | HR 79 | Resp 18 | Ht 72.0 in | Wt 213.5 lb

## 2023-08-18 DIAGNOSIS — Z9682 Presence of neurostimulator: Secondary | ICD-10-CM | POA: Diagnosis not present

## 2023-08-18 DIAGNOSIS — G4733 Obstructive sleep apnea (adult) (pediatric): Secondary | ICD-10-CM

## 2023-08-18 NOTE — Patient Instructions (Signed)
 You are at level 1 =0.2 V  Increase by 1 level every week until snoring decreases or discomfort

## 2023-08-18 NOTE — Progress Notes (Signed)
   Subjective:    Patient ID: Jonathan Shepard, male    DOB: 1951/01/23, 73 y.o.   MRN: [id number]  HPI 73 yo  man for follow-up of OSA.   PMH : CAD, DM, GERD, Nephrolithiasis, HLD, HTN    He was CPAP intolerant and had hypoglossal nerve stimulator implanted on 11/17/22 [name]) - device activated 12/25/22 Start delay 45 minutes -pause time 15 minutes -duration 8 hours   02/2023 OV >>between 0.4 to 0.5 volts  Pulse width 60, rate 40   06/2023 He is at level 4 = 0.8V .  It will be difficult to achieve therapeutic amplitude with  electrode configuration A (+-+) We checked electrode configuration B with a range of 0.3-0.5 and configuration C with a range of 0.4-0.8.  Sensation 0.4 V , functional level 0.4 V lower limit, upper limit 0.8 V   Start delay at 45 minutes, pause time increased to 30 minutes, duration 9 hours On B & D configurations, he had good tongue motion but not enough for range. Set at electrode C, 0.6 V  23-month follow-up visit  He states that he is sleeping well but his wife has noted loud snoring and he continues to remain sleepy in the daytime. Download shows in coming amplitude is 0.6 V.  He is stable at this level since last visit.  He is using therapy all night and every night, average usage 8 hours, 100%.  He is not using the pause button much.       Significant tests/ events reviewed   06/2023 HST on electrode C config 0.6 V >> AHI 30/h, low sat 70 %  Inspire titration 03/2023 >> incoming amplitude 0.7, titrated between 0.5-1.3 V , events noted at all levels, no therapeutic amplitude identified HST January 2023 >> AHI 23.3     Review of Systems neg for any significant sore throat, dysphagia, itching, sneezing, nasal congestion or excess/ purulent secretions, fever, chills, sweats, unintended wt loss, pleuritic or exertional cp, hempoptysis, orthopnea pnd or change in chronic leg swelling. Also denies presyncope, palpitations, heartburn, abdominal pain,  nausea, vomiting, diarrhea or change in bowel or urinary habits, dysuria,hematuria, rash, arthralgias, visual complaints, headache, numbness weakness or ataxia.     Objective:   Physical Exam  Gen. Pleasant, obese, in no distress ENT - no lesions, no post nasal drip Neck: No JVD, no thyromegaly, no carotid bruits Lungs: no use of accessory muscles, no dullness to percussion, decreased without rales or rhonchi  Cardiovascular: Rhythm regular, heart sounds  normal, no murmurs or gallops, no peripheral edema Musculoskeletal: No deformities, no cyanosis or clubbing , no tremors       Assessment & Plan:    OSA status post hypoglossal nerve stimulator device implant  -Device was interrogated today with the programmer. He seemed to tolerate electrode C configuration at higher level better with changing pulse width 60/40.  After checking different electrodes, he was switched back to electrode B 0.2-0.6 range Increase by 1 level every week until snoring decreases or discomfort  We will plan on repeat home sleep test at final level  Weight loss encouraged, compliance with goal all night every night is the expectation. Advised against medications with sedative side effects Cautioned against driving when sleepy - understanding that sleepiness will vary on a day to day basis

## 2023-09-14 ENCOUNTER — Ambulatory Visit: Attending: Cardiovascular Disease | Admitting: Cardiovascular Disease

## 2023-09-14 ENCOUNTER — Other Ambulatory Visit (HOSPITAL_COMMUNITY): Payer: Self-pay

## 2023-09-14 ENCOUNTER — Telehealth: Payer: Self-pay | Admitting: Pharmacy Technician

## 2023-09-14 ENCOUNTER — Encounter: Payer: Self-pay | Admitting: Pulmonary Disease

## 2023-09-14 ENCOUNTER — Encounter: Payer: Self-pay | Admitting: Cardiovascular Disease

## 2023-09-14 VITALS — BP 136/70 | HR 69 | Ht 72.0 in | Wt 220.0 lb

## 2023-09-14 DIAGNOSIS — G4733 Obstructive sleep apnea (adult) (pediatric): Secondary | ICD-10-CM

## 2023-09-14 DIAGNOSIS — E782 Mixed hyperlipidemia: Secondary | ICD-10-CM

## 2023-09-14 DIAGNOSIS — I5032 Chronic diastolic (congestive) heart failure: Secondary | ICD-10-CM

## 2023-09-14 DIAGNOSIS — I2510 Atherosclerotic heart disease of native coronary artery without angina pectoris: Secondary | ICD-10-CM

## 2023-09-14 DIAGNOSIS — N179 Acute kidney failure, unspecified: Secondary | ICD-10-CM | POA: Diagnosis not present

## 2023-09-14 DIAGNOSIS — I4510 Unspecified right bundle-branch block: Secondary | ICD-10-CM | POA: Diagnosis not present

## 2023-09-14 DIAGNOSIS — I739 Peripheral vascular disease, unspecified: Secondary | ICD-10-CM | POA: Diagnosis not present

## 2023-09-14 DIAGNOSIS — N1831 Chronic kidney disease, stage 3a: Secondary | ICD-10-CM

## 2023-09-14 DIAGNOSIS — I10 Essential (primary) hypertension: Secondary | ICD-10-CM | POA: Diagnosis not present

## 2023-09-14 DIAGNOSIS — E119 Type 2 diabetes mellitus without complications: Secondary | ICD-10-CM | POA: Diagnosis not present

## 2023-09-14 MED ORDER — EMPAGLIFLOZIN 10 MG PO TABS
10.0000 mg | ORAL_TABLET | Freq: Every day | ORAL | 11 refills | Status: DC
Start: 2023-09-14 — End: 2023-10-08

## 2023-09-14 MED ORDER — EMPAGLIFLOZIN 10 MG PO TABS: 10.0000 mg | ORAL_TABLET | Freq: Every day | ORAL | Status: DC

## 2023-09-14 MED ORDER — ROSUVASTATIN CALCIUM 20 MG PO TABS: 20.0000 mg | ORAL_TABLET | Freq: Every day | ORAL | 3 refills | Status: AC

## 2023-09-14 NOTE — Telephone Encounter (Signed)
 Pharmacy Patient Advocate Encounter   Received notification from Physician's Office that prior authorization for Jardiance is required/requested.   Insurance verification completed.   The patient is insured through [employer] .   Per test claim: The current 09/14/23 day co-pay is, $47.00- one month.  No PA needed at this time. This test claim was processed through [facility]- copay amounts may vary at other pharmacies due to pharmacy/plan contracts, or as the patient moves through the different stages of their insurance plan.

## 2023-09-14 NOTE — Patient Instructions (Addendum)
 Medication Instructions:  Stop Pravastatin  Start Jardiance 10 mg daily Start Rosuvastatin 20 mg daily *If you need a refill on your cardiac medications before your next appointment, please call your pharmacy*  Lab Work: Lipid panel and A1C- 2-3 months- fasting lab work If you have labs (blood work) drawn today and your tests are completely normal, you will receive your results only by: MyChart Message (if you have MyChart) OR A paper copy in the mail If you have any lab test that is abnormal or we need to change your treatment, we will call you to review the results.  Procedures: Your physician has requested that you have an echocardiogram. Echocardiography is a painless test that uses sound waves to create images of your heart. It provides your doctor with information about the size and shape of your heart and how well your heart's chambers and valves are working. This procedure takes approximately one hour. There are no restrictions for this procedure. Please do NOT wear cologne, perfume, aftershave, or lotions (deodorant is allowed). Please arrive 15 minutes prior to your appointment time.  Please note: We ask at that you not bring children with you during ultrasound (echo/ vascular) testing. Due to room size and safety concerns, children are not allowed in the ultrasound rooms during exams. Our front office staff cannot provide observation of children in our lobby area while testing is being conducted. An adult accompanying a patient to their appointment will only be allowed in the ultrasound room at the discretion of the ultrasound technician under special circumstances. We apologize for any inconvenience.    Follow-Up: At [facility], you and your health needs are our priority.  As part of our continuing mission to provide you with exceptional heart care, our providers are all part of one team.  This team includes your primary Cardiologist (physician) and Advanced Practice  Providers or APPs (Physician Assistants and Nurse Practitioners) who all work together to provide you with the care you need, when you need it.  Your next appointment:   3 month(s)  Provider:   Thurmon Fair, MD     We recommend signing up for the patient portal called "MyChart".  Sign up information is provided on this After Visit Summary.  MyChart is used to connect with patients for Virtual Visits (Telemedicine).  Patients are able to view lab/test results, encounter notes, upcoming appointments, etc.  Non-urgent messages can be sent to your provider as well.   To learn more about what you can do with MyChart, go to [email or web address].         1st Floor: - Lobby - Registration  - Pharmacy  - Lab - Cafe  2nd Floor: - PV Lab - Diagnostic Testing (echo, CT, nuclear med)  3rd Floor: - Vacant  4th Floor: - TCTS (cardiothoracic surgery) - AFib Clinic - Structural Heart Clinic - Vascular Surgery  - Vascular Ultrasound  5th Floor: - [facility] (general and EP) - Clinical Pharmacy for coumadin, hypertension, lipid, weight-loss medications, and med management appointments    Valet parking services will be available as well.

## 2023-09-14 NOTE — Progress Notes (Signed)
 Called the patient and informed that Dr Royann Shivers would like for him to get an Echocardiogram. Order placed and someone will call him to schedule this. He verbalized understanding.

## 2023-09-14 NOTE — Progress Notes (Signed)
 Cardiology Office Note    Date:  09/14/2023   ID:  AZHAR KNOPE, DOB 1950/10/03, MRN [id number]  PCP:  Elfredia Nevins, MD  Cardiologist:   Thurmon Fair, MD   No chief complaint on file.   History of Present Illness:  Jonathan Shepard is a 73 y.o. male with long-standing history of coronary artery disease (RCA and LAD stents 2002 and 2008,CABG Nov 2017 -  LIMA to LAD, SVG to diagonal, SVG to PDA, left radial to OM 2, Dr. Dorris Fetch) who presents in follow-up after undergoing 4 vessel bypass surgery roughly 8 years ago.  He describes shortness of breath walking to the mailbox and back, but has no difficulty with activities of daily living.  He does not have any chest pain at rest or with activity.  He denies palpitations, dizziness or syncope.  He denies neurological complaints or intermittent claudication.  Blood pressure control has been good this year.    He received an inspire device in an attempt to get off CPAP, but this has not really helped.  He spends 8 hours sleeping at night but then is very tired all day long and falls asleep easily throughout the day.  His wife reports that he still stops breathing at night and is still snoring loudly.  The settings of the device have been adjusted a couple of times.  Attempts to "turn it up" have led to immediate headaches.  There is a plan to repeat a sleep study.  He still has his old CPAP equipment, but has not been using it.  Metabolic control has deteriorated.  His most recent hemoglobin A1c was up to 9.7%.  Triglycerides are also mildly elevated and his LDL is now 102.  Has a chronically low HDL at 33.  His current medications are glimepiride and metformin for diabetes and pravastatin for his cholesterol.  He also takes fenofibrate for elevated triglycerides.  He reports eating a bacon and egg biscuit every morning.  ECG does not show any ischemic changes.  His last functional study was a nuclear stress test in 2019 that did not  show ischemia, showed normal LVEF and reported "diaphragmatic attenuation artifact".   Initial presentation in 2002 - mid right coronary Hepacoat 3.0x13 stent.  In May 2008 had restenosis in the right coronary artery stent and "sandwich stenting" - Liberte 3.75 stent.  In December 2008 had cutting balloon angioplasty for in stent restenosis. In the same setting received a drug-eluting 3 x 15 mm Promus stent in the proximal LAD artery for a de novo lesion. July 2014 a nuclear stress test performed for similar complaints of exertional discomfort was normal. November 2017 worsening exertional angina led to angiography and four-vessel bypass surgery.   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    Past Surgical History:  Procedure Laterality Date   CARDIAC CATHETERIZATION N/A 04/14/2016  Procedure: Left Heart Cath and Coronary Angiography;  Surgeon: Peter M Swaziland, MD;  Location: [facility] INVASIVE CV LAB;  Service: Cardiovascular;  Laterality: N/A;   COLONOSCOPY WITH PROPOFOL N/A 01/09/2019   Procedure: COLONOSCOPY WITH PROPOFOL;  Surgeon: Corbin Ade, MD;  Location: [facility] ENDO SUITE;  Service: Endoscopy;  Laterality: N/A;  8:30am   CORONARY ARTERY BYPASS GRAFT N/A 04/22/2016   Procedure: CORONARY ARTERY BYPASS GRAFTING (CABG)x4 with endoscopic harvesting of right saphenous vein SVG to diagonal SVG to PDA Left Radial artery to OM 2 LIMA to LAD;  Surgeon: Loreli Slot, MD;   Location[facility] OR;  Service: Open Heart Surgery;  Laterality: N/A;   DRUG INDUCED ENDOSCOPY Bilateral 09/28/2022   Procedure: DRUG INDUCED ENDOSCOPY;  Surgeon: Christia Reading, MD;  Location: [facility] OR;  Service: ENT;  Laterality: Bilateral;   EYE SURGERY Bilateral 2023   cataract surgery   IMPLANTATION OF HYPOGLOSSAL NERVE STIMULATOR Right 11/17/2022   Procedure: IMPLANTATION OF HYPOGLOSSAL NERVE STIMULATOR;  Surgeon: Christia Reading, MD;  Location: [facility] OR;  Service: ENT;  Laterality: Right;   RADIAL ARTERY HARVEST Left 04/22/2016   Procedure: LEFT RADIAL ARTERY HARVEST;  Surgeon: Loreli Slot, MD;  Location: [facility] OR;  Service: Open Heart Surgery;  Laterality: Left;   TEE WITHOUT CARDIOVERSION N/A 04/22/2016   Procedure: TRANSESOPHAGEAL ECHOCARDIOGRAM (TEE);  Surgeon: Loreli Slot, MD;  Location: [facility] OR;  Service: Open Heart Surgery;  Laterality: N/A;    Current Medications: Outpatient Medications Prior to Visit  Medication Sig Dispense Refill   amLODipine (NORVASC) 5 MG tablet Take 5 mg by mouth 2 (two) times daily.     aspirin EC 81 MG tablet Take 81 mg by mouth daily. Swallow whole.     clopidogrel (PLAVIX) 75 MG tablet Take 1 tablet (75 mg total) by mouth daily. 90 tablet 0   Coenzyme Q10 (COQ-10) 100 MG CAPS Take 100 mg by mouth daily.     diclofenac (VOLTAREN) 75 MG EC tablet Take 75 mg by mouth 2 (two) times daily.     EPINEPHrine 0.3 mg/0.3 mL IJ SOAJ injection Inject 0.3 mg into the muscle as needed for anaphylaxis. 2 each 1   famotidine (PEPCID) 20 MG tablet Take 1 tablet (20 mg total) by mouth 2 (two) times daily. 10 tablet 0   fenofibrate 160 MG tablet Take 160 mg by mouth daily.     Flaxseed, Linseed, (FLAX SEEDS PO) Take 1-2 capsules by mouth See admin instructions. Take 2 capsules in the morning and 1 capsule in the evening     glimepiride (AMARYL) 4 MG tablet Take 4 mg by mouth 2 (two) times daily.     metFORMIN (GLUCOPHAGE) 1000 MG tablet Take 1,000 mg by mouth 2 (two)  times daily with a meal.     metoprolol tartrate (LOPRESSOR) 50 MG tablet Take 50 mg by mouth 2 (two) times daily.     Multiple Vitamin (MULTIVITAMIN WITH MINERALS) TABS tablet Take 1 tablet by mouth daily.     Oxycodone HCl 10 MG TABS Take 10 mg by mouth daily as needed (breakthrough pain).     pantoprazole (PROTONIX) 40 MG tablet TAKE 1 TABLET EVERY DAY 90 tablet 1   ramipril (ALTACE) 10 MG capsule Take 1 capsule (10 mg total) by mouth daily. 90 capsule 0   pravastatin (PRAVACHOL) 80 MG tablet TAKE 1 TABLET EVERY DAY 90 tablet 3   No facility-administered medications prior to visit.     Allergies:   Bee venom  Family History:  The patient's family history includes Cancer in his father; Coronary artery disease (age of onset: 42) in his brother; Coronary artery disease (age of onset: 90) in his father.    PHYSICAL EXAM:   VS:  BP 136/70   Pulse 69   Ht 6' (1.829 m)   Wt 220 lb (99.8 kg)   SpO2 97%   BMI 29.84 kg/m       General: Alert, oriented x3, no distress, borderline obese Head: no evidence of trauma, PERRL, EOMI, no exophtalmos or lid lag, no myxedema, no xanthelasma; normal ears, nose and oropharynx Neck: normal jugular venous pulsations and no hepatojugular reflux; brisk carotid pulses without delay and no carotid bruits Chest: clear to auscultation, no signs of consolidation by percussion or palpation, normal fremitus, symmetrical and full respiratory excursions Cardiovascular: normal position and quality of the apical impulse, regular rhythm, normal first and second heart sounds, no murmurs, rubs or gallops Abdomen: no tenderness or distention, no masses by palpation, no abnormal pulsatility or arterial bruits, normal bowel sounds, no hepatosplenomegaly Extremities: no clubbing, cyanosis or edema Neurological: grossly nonfocal Psych: Normal mood and affect  Wt Readings from Last 3 Encounters:  09/14/23 220 lb (99.8 kg)  08/18/23 213 lb 8 oz (96.8 kg)  06/22/23 214 lb  3.2 oz (97.2 kg)      Studies/Labs Reviewed:   EKG:    EKG Interpretation Date/Time:  Tuesday September 14 2023 08:33:22 EDT Ventricular Rate:  69 PR Interval:  144 QRS Duration:  130 QT Interval:  430 QTC Calculation: 460 R Axis:   -20  Text Interpretation: Normal sinus rhythm Right bundle branch block Inferior infarct , age undetermined When compared with ECG of 10-Jan-2023 15:19, Although rate has decreased Otherwise no significant change Confirmed by Keifer Habib [phone]) on 09/14/2023 8:44:09 AM         Recent Labs:  12/05/2021 hemoglobin 13.6, creatinine 1.52, potassium 5.1, ALT 32, TSH 1.51 03/10/2023 hemoglobin 13.2, creatinine 1.28, potassium 5.0, normal LFTs, TSH 1.83  Lipid Panel    Component Value Date/Time   CHOL 150 08/28/2016 0749   TRIG 218 (H) 08/28/2016 0749   HDL 32 (L) 08/28/2016 0749   CHOLHDL 4.7 08/28/2016 0749   VLDL 44 (H) 08/28/2016 0749   LDLCALC 74 08/28/2016 0749   03/10/2023 Chol 182, HDL 33, LDL 102, TG 273  ASSESSMENT:    1. Chronic diastolic heart failure (HCC)   2. Coronary artery disease involving native coronary artery of native heart without angina pectoris   3. PAD (peripheral artery disease) (HCC)   4. Mixed hyperlipidemia   5. Non-insulin dependent type 2 diabetes mellitus (HCC)   6. Essential hypertension   7. AKI (acute kidney injury) (HCC)   8. OSA (obstructive sleep apnea)   9. RBBB   10. Stage 3a chronic kidney disease (HCC)      PLAN:  In order of problems listed above: CHF/Shortness of breath: I wonder whether he is describing diastolic heart failure.  NYHA functional class 2, no clinical evidence of hypervolemia. We need to update his echocardiogram since he has not had a functional study in over 6 years.  Will start an SGLT2 inhibitor.   CAD: Almost 8 years after bypass surgery he remains free of angina pectoris.  He is on chronic dual antiplatelet therapy, beta-blocker, lipid-lowering medications. PAD: He does  not have intermittent claudication.  He has mostly infrapopliteal disease.  Will keep on long-term dual antiplatelet therapy. DM: Control has deteriorated.  Discussed ways to improve his diet and the need to increase physical activity.  He is on metformin and a high dose of glimepiride.  I think he will do better if we add an SGLT2 inhibitor such as [place] or [place].  This may also help with shortness of breath if he has diastolic heart failure.  Reviewed the risk of groin/genital yeast infections and the rare possibility of dangerous Fournier's gangrene, when he should seek urgent medical care. HLP: Typical lipid profile for insulin resistance.  LDL is substantially worse than in the past.  In addition to changes in diet and increase physical activity, recommend switching to rosuvastatin to achieve target LDL cholesterol less than 70 and preferably less than 55. HTN: Well-controlled on the current medications. RBBB: In the past he had transient, rate related RBBB.  This now appears to be a permanent conduction abnormality.  Could be related to OSA.  He has no symptoms to suggest high-grade AV block. CKD 3A: Improved creatinine compared to his previous year's labs. GFR 61 based on labs last October, but has been in 50-60 range last few years.  [place] Pepper may also be beneficial to protect from further deterioration in kidney function. OSA: He still snores has witnessed apnea and significant daytime hypersomnolence despite his Inspire device.  Check echo for RV function and pulmonary artery pressures.  Would like to get him back in the sleep clinic and have referred him to see Dr. Mayford Knife.   Medication Adjustments/Labs and Tests Ordered: Current medicines are reviewed at length with the patient today.  Concerns regarding medicines are outlined above.  Medication changes, Labs and Tests ordered today are listed in the Patient Instructions below. Patient Instructions  Medication Instructions:  Stop  Pravastatin  Start Jardiance 10 mg daily Start Rosuvastatin 20 mg daily *If you need a refill on your cardiac medications before your next appointment, please call your pharmacy*  Lab Work: Lipid panel and A1C- 2-3 months- fasting lab work If you have labs (blood work) drawn today and your tests are completely normal, you will receive your results only by: MyChart Message (if you have MyChart) OR A paper copy in the mail If you have any lab test that is abnormal or we need to change your treatment, we will call you to review the results.  Procedures: Your physician has requested that you have an echocardiogram. Echocardiography is a painless test that uses sound waves to create images of your heart. It provides your doctor with information about the size and shape of your heart and how well your heart's chambers and valves are working. This procedure takes approximately one hour. There are no restrictions for this procedure. Please do NOT wear cologne, perfume, aftershave, or lotions (deodorant is allowed). Please arrive 15 minutes prior to your appointment time.  Please note: We ask at that you not bring children with you during ultrasound (echo/ vascular) testing. Due to room size and safety concerns, children are not allowed in the ultrasound rooms during exams. Our front office staff cannot provide observation of children in our lobby area while testing is being conducted. An adult accompanying a patient to their appointment will only be allowed in the ultrasound room at the discretion of the ultrasound technician under special circumstances. We apologize for any inconvenience.    Follow-Up: At [facility], you and your health needs are our priority.  As part of our continuing mission to provide you with exceptional heart care, our providers are all part of  one team.  This team includes your primary Cardiologist (physician) and Advanced Practice Providers or APPs (Physician  Assistants and Nurse Practitioners) who all work together to provide you with the care you need, when you need it.  Your next appointment:   3 month(s)  Provider:   Thurmon Fair, MD     We recommend signing up for the patient portal called "MyChart".  Sign up information is provided on this After Visit Summary.  MyChart is used to connect with patients for Virtual Visits (Telemedicine).  Patients are able to view lab/test results, encounter notes, upcoming appointments, etc.  Non-urgent messages can be sent to your provider as well.   To learn more about what you can do with MyChart, go to [email or web address].         1st Floor: - Lobby - Registration  - Pharmacy  - Lab - Cafe  2nd Floor: - PV Lab - Diagnostic Testing (echo, CT, nuclear med)  3rd Floor: - Vacant  4th Floor: - TCTS (cardiothoracic surgery) - AFib Clinic - Structural Heart Clinic - Vascular Surgery  - Vascular Ultrasound  5th Floor: - HeartCare Cardiology (general and EP) - Clinical Pharmacy for coumadin, hypertension, lipid, weight-loss medications, and med management appointments    Valet parking services will be available as well.      Signed, Thurmon Fair, MD  09/14/2023 10:41 AM    [facility] [address] [place], [place], [place]  [address] Phone: [phone]; Fax: [phone]

## 2023-09-15 DIAGNOSIS — G894 Chronic pain syndrome: Secondary | ICD-10-CM | POA: Diagnosis not present

## 2023-09-15 DIAGNOSIS — E1129 Type 2 diabetes mellitus with other diabetic kidney complication: Secondary | ICD-10-CM | POA: Diagnosis not present

## 2023-09-15 DIAGNOSIS — L4050 Arthropathic psoriasis, unspecified: Secondary | ICD-10-CM | POA: Diagnosis not present

## 2023-09-15 DIAGNOSIS — I10 Essential (primary) hypertension: Secondary | ICD-10-CM | POA: Diagnosis not present

## 2023-09-15 DIAGNOSIS — M1991 Primary osteoarthritis, unspecified site: Secondary | ICD-10-CM | POA: Diagnosis not present

## 2023-09-15 DIAGNOSIS — M25512 Pain in left shoulder: Secondary | ICD-10-CM | POA: Diagnosis not present

## 2023-09-15 DIAGNOSIS — M5412 Radiculopathy, cervical region: Secondary | ICD-10-CM | POA: Diagnosis not present

## 2023-09-28 ENCOUNTER — Encounter: Payer: Self-pay | Admitting: Cardiovascular Disease

## 2023-09-28 ENCOUNTER — Ambulatory Visit (HOSPITAL_COMMUNITY)
Admission: RE | Admit: 2023-09-28 | Discharge: 2023-09-28 | Disposition: A | Discharge status: Home / Self Care | Source: Ambulatory Visit | Attending: Cardiovascular Disease | Admitting: Cardiovascular Disease

## 2023-09-28 DIAGNOSIS — I5032 Chronic diastolic (congestive) heart failure: Secondary | ICD-10-CM | POA: Insufficient documentation

## 2023-09-28 LAB — ECHOCARDIOGRAM COMPLETE
AR max vel: 2.8 cm2
AV Area VTI: 3.17 cm2
AV Area mean vel: 3.19 cm2
AV Mean grad: 5.6 mmHg
AV Peak grad: 12.1 mmHg
Ao pk vel: 1.74 m/s
Area-P 1/2: 2.62 cm2
S' Lateral: 2.5 cm

## 2023-09-28 MED ORDER — PERFLUTREN LIPID MICROSPHERE
1.0000 mL | INTRAVENOUS | Status: AC | PRN
  Administered 2023-09-28: 4 mL via INTRAVENOUS

## 2023-09-28 NOTE — Progress Notes (Signed)
*  PRELIMINARY RESULTS* Echocardiogram 2D Echocardiogram has been performed with Definity .  Bernis Brisker 09/28/2023, 11:24 AM

## 2023-10-08 ENCOUNTER — Other Ambulatory Visit: Payer: Self-pay

## 2023-10-08 DIAGNOSIS — I10 Essential (primary) hypertension: Secondary | ICD-10-CM

## 2023-10-08 DIAGNOSIS — E119 Type 2 diabetes mellitus without complications: Secondary | ICD-10-CM

## 2023-10-08 DIAGNOSIS — I2510 Atherosclerotic heart disease of native coronary artery without angina pectoris: Secondary | ICD-10-CM

## 2023-10-08 DIAGNOSIS — N179 Acute kidney failure, unspecified: Secondary | ICD-10-CM

## 2023-10-08 MED ORDER — EMPAGLIFLOZIN 10 MG PO TABS: 10.0000 mg | ORAL_TABLET | Freq: Every day | ORAL | 3 refills | Status: AC

## 2023-10-12 ENCOUNTER — Ambulatory Visit (HOSPITAL_BASED_OUTPATIENT_CLINIC_OR_DEPARTMENT_OTHER): Admitting: Pulmonary Disease

## 2023-10-13 ENCOUNTER — Inpatient Hospital Stay (HOSPITAL_COMMUNITY)
Admission: EM | Admit: 2023-10-13 | Discharge: 2023-10-15 | DRG: 683 | Disposition: A | Discharge status: Home / Self Care | Attending: Internal Medicine | Admitting: Internal Medicine

## 2023-10-13 ENCOUNTER — Other Ambulatory Visit: Payer: Self-pay

## 2023-10-13 ENCOUNTER — Encounter (HOSPITAL_COMMUNITY): Payer: Self-pay

## 2023-10-13 DIAGNOSIS — G4730 Sleep apnea, unspecified: Secondary | ICD-10-CM | POA: Diagnosis present

## 2023-10-13 DIAGNOSIS — A0832 Astrovirus enteritis: Secondary | ICD-10-CM | POA: Diagnosis not present

## 2023-10-13 DIAGNOSIS — N1832 Chronic kidney disease, stage 3b: Secondary | ICD-10-CM | POA: Diagnosis present

## 2023-10-13 DIAGNOSIS — Z87442 Personal history of urinary calculi: Secondary | ICD-10-CM

## 2023-10-13 DIAGNOSIS — G4733 Obstructive sleep apnea (adult) (pediatric): Secondary | ICD-10-CM | POA: Diagnosis not present

## 2023-10-13 DIAGNOSIS — I10 Essential (primary) hypertension: Secondary | ICD-10-CM | POA: Diagnosis not present

## 2023-10-13 DIAGNOSIS — M1990 Unspecified osteoarthritis, unspecified site: Secondary | ICD-10-CM | POA: Diagnosis present

## 2023-10-13 DIAGNOSIS — R42 Dizziness and giddiness: Secondary | ICD-10-CM | POA: Diagnosis not present

## 2023-10-13 DIAGNOSIS — Z79899 Other long term (current) drug therapy: Secondary | ICD-10-CM

## 2023-10-13 DIAGNOSIS — Z7902 Long term (current) use of antithrombotics/antiplatelets: Secondary | ICD-10-CM | POA: Diagnosis not present

## 2023-10-13 DIAGNOSIS — R0689 Other abnormalities of breathing: Secondary | ICD-10-CM | POA: Diagnosis not present

## 2023-10-13 DIAGNOSIS — E1122 Type 2 diabetes mellitus with diabetic chronic kidney disease: Secondary | ICD-10-CM | POA: Diagnosis present

## 2023-10-13 DIAGNOSIS — E119 Type 2 diabetes mellitus without complications: Secondary | ICD-10-CM | POA: Diagnosis not present

## 2023-10-13 DIAGNOSIS — E871 Hypo-osmolality and hyponatremia: Secondary | ICD-10-CM | POA: Diagnosis present

## 2023-10-13 DIAGNOSIS — Z809 Family history of malignant neoplasm, unspecified: Secondary | ICD-10-CM

## 2023-10-13 DIAGNOSIS — I129 Hypertensive chronic kidney disease with stage 1 through stage 4 chronic kidney disease, or unspecified chronic kidney disease: Secondary | ICD-10-CM | POA: Diagnosis present

## 2023-10-13 DIAGNOSIS — E669 Obesity, unspecified: Secondary | ICD-10-CM | POA: Diagnosis not present

## 2023-10-13 DIAGNOSIS — E1151 Type 2 diabetes mellitus with diabetic peripheral angiopathy without gangrene: Secondary | ICD-10-CM | POA: Diagnosis present

## 2023-10-13 DIAGNOSIS — I4510 Unspecified right bundle-branch block: Secondary | ICD-10-CM | POA: Diagnosis present

## 2023-10-13 DIAGNOSIS — K219 Gastro-esophageal reflux disease without esophagitis: Secondary | ICD-10-CM | POA: Diagnosis present

## 2023-10-13 DIAGNOSIS — Z951 Presence of aortocoronary bypass graft: Secondary | ICD-10-CM

## 2023-10-13 DIAGNOSIS — E785 Hyperlipidemia, unspecified: Secondary | ICD-10-CM | POA: Diagnosis present

## 2023-10-13 DIAGNOSIS — W57XXXA Bitten or stung by nonvenomous insect and other nonvenomous arthropods, initial encounter: Secondary | ICD-10-CM | POA: Diagnosis present

## 2023-10-13 DIAGNOSIS — I959 Hypotension, unspecified: Secondary | ICD-10-CM | POA: Diagnosis not present

## 2023-10-13 DIAGNOSIS — Z6829 Body mass index (BMI) 29.0-29.9, adult: Secondary | ICD-10-CM

## 2023-10-13 DIAGNOSIS — Z7984 Long term (current) use of oral hypoglycemic drugs: Secondary | ICD-10-CM

## 2023-10-13 DIAGNOSIS — R197 Diarrhea, unspecified: Secondary | ICD-10-CM | POA: Diagnosis not present

## 2023-10-13 DIAGNOSIS — Z7982 Long term (current) use of aspirin: Secondary | ICD-10-CM

## 2023-10-13 DIAGNOSIS — E872 Acidosis, unspecified: Secondary | ICD-10-CM | POA: Diagnosis present

## 2023-10-13 DIAGNOSIS — E860 Dehydration: Secondary | ICD-10-CM | POA: Diagnosis not present

## 2023-10-13 DIAGNOSIS — I2510 Atherosclerotic heart disease of native coronary artery without angina pectoris: Secondary | ICD-10-CM | POA: Diagnosis present

## 2023-10-13 DIAGNOSIS — I739 Peripheral vascular disease, unspecified: Secondary | ICD-10-CM | POA: Diagnosis present

## 2023-10-13 DIAGNOSIS — Z8249 Family history of ischemic heart disease and other diseases of the circulatory system: Secondary | ICD-10-CM | POA: Diagnosis not present

## 2023-10-13 DIAGNOSIS — Z91030 Bee allergy status: Secondary | ICD-10-CM | POA: Diagnosis not present

## 2023-10-13 DIAGNOSIS — N179 Acute kidney failure, unspecified: Principal | ICD-10-CM

## 2023-10-13 DIAGNOSIS — I25118 Atherosclerotic heart disease of native coronary artery with other forms of angina pectoris: Secondary | ICD-10-CM | POA: Diagnosis not present

## 2023-10-13 DIAGNOSIS — R9431 Abnormal electrocardiogram [ECG] [EKG]: Secondary | ICD-10-CM | POA: Diagnosis not present

## 2023-10-13 DIAGNOSIS — R55 Syncope and collapse: Secondary | ICD-10-CM | POA: Diagnosis not present

## 2023-10-13 LAB — CBC WITH DIFFERENTIAL/PLATELET
Abs Immature Granulocytes: 0.04 10*3/uL (ref 0.00–0.07)
Basophils Absolute: 0 10*3/uL (ref 0.0–0.1)
Basophils Relative: 0 %
Eosinophils Absolute: 0.1 10*3/uL (ref 0.0–0.5)
Eosinophils Relative: 2 %
HCT: 40.3 % (ref 39.0–52.0)
Hemoglobin: 13 g/dL (ref 13.0–17.0)
Immature Granulocytes: 1 %
Lymphocytes Relative: 16 %
Lymphs Abs: 1.3 10*3/uL (ref 0.7–4.0)
MCH: 29.7 pg (ref 26.0–34.0)
MCHC: 32.3 g/dL (ref 30.0–36.0)
MCV: 92.2 fL (ref 80.0–100.0)
Monocytes Absolute: 0.8 10*3/uL (ref 0.1–1.0)
Monocytes Relative: 10 %
Neutro Abs: 5.8 10*3/uL (ref 1.7–7.7)
Neutrophils Relative %: 71 %
Platelets: 190 10*3/uL (ref 150–400)
RBC: 4.37 MIL/uL (ref 4.22–5.81)
RDW: 14 % (ref 11.5–15.5)
WBC: 8 10*3/uL (ref 4.0–10.5)
nRBC: 0 % (ref 0.0–0.2)

## 2023-10-13 LAB — URINALYSIS, ROUTINE W REFLEX MICROSCOPIC
Bilirubin Urine: NEGATIVE
Glucose, UA: 150 mg/dL — AB
Hgb urine dipstick: NEGATIVE
Ketones, ur: NEGATIVE mg/dL
Nitrite: NEGATIVE
Protein, ur: 30 mg/dL — AB
Specific Gravity, Urine: 1.016 (ref 1.005–1.030)
pH: 5 (ref 5.0–8.0)

## 2023-10-13 LAB — COMPREHENSIVE METABOLIC PANEL WITH GFR
ALT: 23 U/L (ref 0–44)
AST: 23 U/L (ref 15–41)
Albumin: 3.4 g/dL — ABNORMAL LOW (ref 3.5–5.0)
Alkaline Phosphatase: 48 U/L (ref 38–126)
Anion gap: 13 (ref 5–15)
BUN: 63 mg/dL — ABNORMAL HIGH (ref 8–23)
CO2: 14 mmol/L — ABNORMAL LOW (ref 22–32)
Calcium: 8.1 mg/dL — ABNORMAL LOW (ref 8.9–10.3)
Chloride: 105 mmol/L (ref 98–111)
Creatinine, Ser: 3.54 mg/dL — ABNORMAL HIGH (ref 0.61–1.24)
GFR, Estimated: 18 mL/min — ABNORMAL LOW (ref >60)
Glucose, Bld: 162 mg/dL — ABNORMAL HIGH (ref 70–99)
Potassium: 4.7 mmol/L (ref 3.5–5.1)
Sodium: 132 mmol/L — ABNORMAL LOW (ref 135–145)
Total Bilirubin: 0.5 mg/dL (ref 0.0–1.2)
Total Protein: 6.5 g/dL (ref 6.5–8.1)

## 2023-10-13 LAB — PHOSPHORUS: Phosphorus: 5.7 mg/dL — ABNORMAL HIGH (ref 2.5–4.6)

## 2023-10-13 LAB — CBG MONITORING, ED: Glucose-Capillary: 150 mg/dL — ABNORMAL HIGH (ref 70–99)

## 2023-10-13 LAB — CK: Total CK: 58 U/L (ref 49–397)

## 2023-10-13 LAB — HEMOGLOBIN A1C
Hgb A1c MFr Bld: 7.8 % — ABNORMAL HIGH (ref 4.8–5.6)
Mean Plasma Glucose: 177.16 mg/dL

## 2023-10-13 LAB — C DIFFICILE QUICK SCREEN W PCR REFLEX
C Diff antigen: NEGATIVE
C Diff interpretation: NOT DETECTED
C Diff toxin: NEGATIVE

## 2023-10-13 LAB — MAGNESIUM: Magnesium: 1.7 mg/dL (ref 1.7–2.4)

## 2023-10-13 LAB — LIPASE, BLOOD: Lipase: 48 U/L (ref 11–51)

## 2023-10-13 LAB — GLUCOSE, CAPILLARY: Glucose-Capillary: 100 mg/dL — ABNORMAL HIGH (ref 70–99)

## 2023-10-13 MED ORDER — SODIUM CHLORIDE 0.9 % IV BOLUS
500.0000 mL | Freq: Once | INTRAVENOUS | Status: AC
  Administered 2023-10-13: 500 mL via INTRAVENOUS

## 2023-10-13 MED ORDER — INSULIN ASPART 100 UNIT/ML IJ SOLN
0.0000 [IU] | Freq: Three times a day (TID) | INTRAMUSCULAR | Status: DC
  Administered 2023-10-14: 1 [IU] via SUBCUTANEOUS
  Administered 2023-10-14 – 2023-10-15 (×2): 2 [IU] via SUBCUTANEOUS
  Administered 2023-10-15: 1 [IU] via SUBCUTANEOUS

## 2023-10-13 MED ORDER — HEPARIN SODIUM (PORCINE) 5000 UNIT/ML IJ SOLN
5000.0000 [IU] | Freq: Three times a day (TID) | INTRAMUSCULAR | Status: DC
  Administered 2023-10-14 – 2023-10-15 (×3): 5000 [IU] via SUBCUTANEOUS
  Filled 2023-10-13 (×3): qty 1

## 2023-10-13 MED ORDER — ONDANSETRON HCL 4 MG/2ML IJ SOLN: 4.0000 mg | Freq: Four times a day (QID) | INTRAMUSCULAR | Status: DC | PRN

## 2023-10-13 MED ORDER — SODIUM BICARBONATE 650 MG PO TABS
650.0000 mg | ORAL_TABLET | Freq: Three times a day (TID) | ORAL | Status: DC
  Administered 2023-10-13 (×2): 650 mg via ORAL
  Filled 2023-10-13 (×3): qty 1

## 2023-10-13 MED ORDER — SODIUM CHLORIDE 0.9 % IV SOLN: INTRAVENOUS | Status: DC

## 2023-10-13 MED ORDER — ONDANSETRON HCL 4 MG/2ML IJ SOLN
4.0000 mg | Freq: Once | INTRAMUSCULAR | Status: DC
  Filled 2023-10-13: qty 2

## 2023-10-13 MED ORDER — DOXYCYCLINE HYCLATE 100 MG PO TABS
100.0000 mg | ORAL_TABLET | Freq: Two times a day (BID) | ORAL | Status: DC
  Administered 2023-10-13 – 2023-10-15 (×4): 100 mg via ORAL
  Filled 2023-10-13 (×4): qty 1

## 2023-10-13 MED ORDER — CLOPIDOGREL BISULFATE 75 MG PO TABS
75.0000 mg | ORAL_TABLET | Freq: Every day | ORAL | Status: DC
  Administered 2023-10-14 – 2023-10-15 (×2): 75 mg via ORAL
  Filled 2023-10-13 (×2): qty 1

## 2023-10-13 MED ORDER — PANTOPRAZOLE SODIUM 40 MG PO TBEC
40.0000 mg | DELAYED_RELEASE_TABLET | Freq: Every day | ORAL | Status: DC
  Administered 2023-10-14 – 2023-10-15 (×2): 40 mg via ORAL
  Filled 2023-10-13 (×2): qty 1

## 2023-10-13 MED ORDER — HYDRALAZINE HCL 20 MG/ML IJ SOLN: 5.0000 mg | INTRAMUSCULAR | Status: DC | PRN

## 2023-10-13 MED ORDER — INSULIN ASPART 100 UNIT/ML IJ SOLN: 0.0000 [IU] | Freq: Every day | INTRAMUSCULAR | Status: DC

## 2023-10-13 MED ORDER — ONDANSETRON HCL 4 MG PO TABS: 4.0000 mg | ORAL_TABLET | Freq: Four times a day (QID) | ORAL | Status: DC | PRN

## 2023-10-13 NOTE — Progress Notes (Signed)
   10/13/23 2230  TOC Brief Assessment  Insurance and Status Reviewed  Patient has primary care physician Yes  Home environment has been reviewed From home  Prior level of function: Independent  Prior/Current Home Services No current home services  Social Drivers of Health Review SDOH reviewed no interventions necessary  Readmission risk has been reviewed Yes  Transition of care needs no transition of care needs at this time   Transition of Care Department [facility]) has reviewed patient and no other TOC needs have been identified at this time. We will continue to monitor patient advancement through interdisciplinary progression rounds. If new patient needs arise, please place a TOC consult.

## 2023-10-13 NOTE — ED Triage Notes (Signed)
 Pt arrives via [facility] from [facility] where he reports dizziness since Sunday, diarrhea, and vomiting starting yesterday with body aches. Pt was covid and flu negative at [facility] per EMS. Was reports to be orthostatic with EMS. 18 g Left Ac with 500 NS given PTA. CBG 130 99% RA, 16 RR. BP 100/62.

## 2023-10-13 NOTE — Progress Notes (Signed)
 Skin check completed and no further ticks noted on body.

## 2023-10-13 NOTE — ED Notes (Signed)
 ED Provider at bedside.

## 2023-10-13 NOTE — ED Provider Notes (Signed)
 [place] EMERGENCY DEPARTMENT AT [facility] Provider Note   CSN: [id number] Arrival date & time: 10/13/23  1511     History  Chief Complaint  Patient presents with   Near Syncope    Jonathan Shepard is a 73 y.o. male.   Near Syncope     This patient is a 73 year old male, he has a history of cardiac disease status post 5 vessel bypass, he is on clopidogrel , aspirin , amlodipine  but also takes diabetic medications including Jardiance , glimepiride  and metformin .  He takes rosuvastatin  and is on metoprolol  and amlodipine .  He reports that over the last 4 days he has had diarrhea, it has been very watery, multiple times per day, he has been able to eat and drink but has decreased appetite.  No abdominal pain.  Reports that his diarrhea today went from super watery to more like a chicken soup consistency but no formed brown stool and no blood in the stool.  He endorses new headaches as well as new myalgias.  He went to the urgent care, was tested for both COVID and flu and was negative, they did an EKG and shot of right bundle branch block so they redirected him to the ER.  The patient had no history of having right bundle branch block to the best of his knowledge.  Home Medications Prior to Admission medications   Medication Sig Start Date End Date Taking? Authorizing Provider  metoprolol  tartrate (LOPRESSOR ) 25 MG tablet Take 25 mg by mouth 2 (two) times daily. 08/25/23  Yes [provider]  amLODipine  (NORVASC ) 5 MG tablet Take 5 mg by mouth 2 (two) times daily.    [provider]  aspirin  EC 81 MG tablet Take 81 mg by mouth daily. Swallow whole.    [provider]  clopidogrel  (PLAVIX ) 75 MG tablet Take 1 tablet (75 mg total) by mouth daily. 05/10/19   Croitoru, Mihai, MD  Coenzyme Q10 (COQ-10) 100 MG CAPS Take 100 mg by mouth daily.    [provider]  diclofenac (VOLTAREN) 75 MG EC tablet Take 75 mg by mouth 2 (two) times daily. 09/24/22    [provider]  empagliflozin  (JARDIANCE ) 10 MG TABS tablet Take 1 tablet (10 mg total) by mouth daily before breakfast. 09/14/23   Croitoru, Karyl Paget, MD  empagliflozin  (JARDIANCE ) 10 MG TABS tablet Take 1 tablet (10 mg total) by mouth daily before breakfast. 10/08/23   Croitoru, Mihai, MD  EPINEPHrine  0.3 mg/0.3 mL IJ SOAJ injection Inject 0.3 mg into the muscle as needed for anaphylaxis. 01/10/23   Cheyenne Cotta, MD  famotidine  (PEPCID ) 20 MG tablet Take 1 tablet (20 mg total) by mouth 2 (two) times daily. 01/10/23   Cheyenne Cotta, MD  fenofibrate  160 MG tablet Take 160 mg by mouth daily.    [provider]  Flaxseed, Linseed, (FLAX SEEDS PO) Take 1-2 capsules by mouth See admin instructions. Take 2 capsules in the morning and 1 capsule in the evening    [provider]  fluticasone  (FLONASE ) 50 MCG/ACT nasal spray Place 1 spray into both nostrils daily. 08/10/23   [provider]  glimepiride  (AMARYL ) 4 MG tablet Take 4 mg by mouth 2 (two) times daily.    [provider]  metFORMIN  (GLUCOPHAGE ) 1000 MG tablet Take 1,000 mg by mouth 2 (two) times daily with a meal.    [provider]  Multiple Vitamin (MULTIVITAMIN WITH MINERALS) TABS tablet Take 1 tablet by mouth daily.  [provider]  oxyCODONE  (ROXICODONE ) 15 MG immediate release tablet Take 15 mg by mouth every 6 (six) hours. 09/22/23   [provider]  pantoprazole  (PROTONIX ) 40 MG tablet TAKE 1 TABLET EVERY DAY 05/10/18   Kilroy, Luke K, PA-C  pravastatin  (PRAVACHOL ) 20 MG tablet Take 20 mg by mouth daily. 08/25/23   [provider]  ramipril  (ALTACE ) 10 MG capsule Take 1 capsule (10 mg total) by mouth daily. 07/30/22   Croitoru, Mihai, MD  rosuvastatin  (CRESTOR ) 20 MG tablet Take 1 tablet (20 mg total) by mouth daily. 09/14/23   Croitoru, Karyl Paget, MD      Allergies    Bee venom    Review of Systems   Review of Systems  Cardiovascular:  Positive for near-syncope.  All  other systems reviewed and are negative.   Physical Exam Updated Vital Signs BP 116/61 (BP Location: Right Arm)   Pulse 83   Temp 97.8 F (36.6 C) (Oral)   Resp (!) 24   Ht 1.829 m (6')   Wt 97.5 kg   SpO2 97%   BMI 29.16 kg/m  Physical Exam Vitals and nursing note reviewed.  Constitutional:      General: He is not in acute distress.    Appearance: He is well-developed.  HENT:     Head: Normocephalic and atraumatic.     Mouth/Throat:     Pharynx: No oropharyngeal exudate.  Eyes:     General: No scleral icterus.       Right eye: No discharge.        Left eye: No discharge.     Conjunctiva/sclera: Conjunctivae normal.     Pupils: Pupils are equal, round, and reactive to light.  Neck:     Thyroid : No thyromegaly.     Vascular: No JVD.  Cardiovascular:     Rate and Rhythm: Normal rate and regular rhythm.     Heart sounds: Normal heart sounds. No murmur heard.    No friction rub. No gallop.  Pulmonary:     Effort: Pulmonary effort is normal. No respiratory distress.     Breath sounds: Normal breath sounds. No wheezing or rales.  Abdominal:     General: Bowel sounds are normal. There is no distension.     Palpations: Abdomen is soft. There is no mass.     Tenderness: There is no abdominal tenderness.  Musculoskeletal:        General: No tenderness. Normal range of motion.     Cervical back: Normal range of motion and neck supple.     Right lower leg: No edema.     Left lower leg: No edema.  Lymphadenopathy:     Cervical: No cervical adenopathy.  Skin:    General: Skin is warm and dry.     Findings: No erythema or rash.  Neurological:     Mental Status: He is alert.     Coordination: Coordination normal.  Psychiatric:        Behavior: Behavior normal.     ED Results / Procedures / Treatments   Labs (all labs ordered are listed, but only abnormal results are displayed) Labs Reviewed  COMPREHENSIVE METABOLIC PANEL WITH GFR - Abnormal; Notable for the following  components:      Result Value   Sodium 132 (*)    CO2 14 (*)    Glucose, Bld 162 (*)    BUN 63 (*)    Creatinine, Ser 3.54 (*)    Calcium  8.1 (*)  Albumin  3.4 (*)    GFR, Estimated 18 (*)    All other components within normal limits  CBG MONITORING, ED - Abnormal; Notable for the following components:   Glucose-Capillary 150 (*)    All other components within normal limits  CBC WITH DIFFERENTIAL/PLATELET  LIPASE, BLOOD  CK  URINALYSIS, ROUTINE W REFLEX MICROSCOPIC    EKG EKG Interpretation Date/Time:  Wednesday Oct 13 2023 15:19:02 EDT Ventricular Rate:  82 PR Interval:  147 QRS Duration:  136 QT Interval:  411 QTC Calculation: 480 R Axis:   220  Text Interpretation: Sinus rhythm Right bundle branch block Inferolateral infarct, old since last tracing no significant change Confirmed by Early Glisson ([id number]) on 10/13/2023 3:26:37 PM  Radiology No results found.  Procedures .Critical Care  Performed by: Early Glisson, MD Authorized by: Early Glisson, MD   Critical care provider statement:    Critical care time (minutes):  45   Critical care time was exclusive of:  Separately billable procedures and treating other patients and teaching time   Critical care was necessary to treat or prevent imminent or life-threatening deterioration of the following conditions:  Renal failure and dehydration   Critical care was time spent personally by me on the following activities:  Development of treatment plan with patient or surrogate, discussions with consultants, evaluation of patient's response to treatment, examination of patient, ordering and review of laboratory studies, ordering and review of radiographic studies, ordering and performing treatments and interventions, pulse oximetry, re-evaluation of patient's condition, review of old charts and obtaining history from patient or surrogate   I assumed direction of critical care for this patient from another provider in my specialty:  no     Care discussed with: admitting provider   Comments:           Medications Ordered in ED Medications  ondansetron  (ZOFRAN ) injection 4 mg (4 mg Intravenous Patient Refused/Not Given 10/13/23 1536)  0.9 %  sodium chloride  infusion (has no administration in time range)  sodium chloride  0.9 % bolus 500 mL (500 mLs Intravenous New Bag/Given 10/13/23 1535)    ED Course/ Medical Decision Making/ A&P                                 Medical Decision Making Amount and/or Complexity of Data Reviewed Labs: ordered.  Risk Prescription drug management. Decision regarding hospitalization.   Stinson this patient has otherwise no acute findings on exam, vital signs are unremarkable, there is no signs of hypotension or tachycardia, EKG is unremarkable and shows right bundle branch block which is consistent with multiple prior EKGs after my review of the medical record.  Will proceed with some IV hydration, checking some basic labs, likely has some type of diarrheal illness.  There has been no travel no recent antibiotics, no sick contacts, no changes in diet.  He did try to eat a gravy biscuit this morning and "surprisingly" had more diarrhea   Co morbidities that complicate the patient evaluation  CAD, Htn, DM   Additional history obtained:  Additional history obtained from cardiology notes, was last seen about a month ago, echocardiogram External records from outside source obtained and reviewed including echocardiogram from April 2025, this showed that the patient had ejection fraction of 65 to 70% with grade 1 diastolic dysfunction   Lab Tests:  I Ordered, and personally interpreted labs.  The pertinent results include: Acute renal failure with a  creatinine of 3.5, baseline 1.5, CBC with no leukocytosis or anemia, glucose of 160, CO2 of 14, lipase and CK both normal    Cardiac Monitoring: / EKG:  The patient was maintained on a cardiac monitor.  I personally viewed and  interpreted the cardiac monitored which showed an underlying rhythm of: Normal rhythm   Problem List / ED Course / Critical interventions / Medication management  The patient presents with acute renal failure, this appears to be prerenal based on the elevation in the BUN, and this is also consistent with the patient's history of being severely dehydrated over the last few days.  He is not able to make any urine and appears oliguric at this time I ordered medication including IV fluids for severe dehydration Reevaluation of the patient after these medicines showed that the patient slight improvement but critically ill I have reviewed the patients home medicines and have made adjustments as needed   Consultations Obtained:  I requested consultation with the hospitalist,  and discussed lab and imaging findings as well as pertinent plan - they recommend: Admission   Social Determinants of Health:  Coronary disease   Test / Admission - Considered:  Admit to hospital for acute renal failure in the presence of severe dehydration and diarrhea         Final Clinical Impression(s) / ED Diagnoses Final diagnoses:  Acute renal failure, unspecified acute renal failure type (HCC)  Severe dehydration    Rx / DC Orders ED Discharge Orders     None         Early Glisson, MD 10/13/23 1610

## 2023-10-13 NOTE — ED Notes (Signed)
 Tick was found on pt. Jonathan Shepard notified.

## 2023-10-13 NOTE — H&P (Addendum)
 [facility] H&P   Patient Demographics:    Jonathan Shepard, is a 73 y.o. male  MRN: [id number]   DOB - 1950-11-16  Admit Date - 10/13/2023  Outpatient Primary MD for the patient is Kathyleen Parkins, MD  Referring MD/NP/PA: Dr Annabell Key  Patient coming from: home  Chief Complaint  Patient presents with   Near Syncope      HPI:    Jonathan Shepard  is a 73 y.o. male,  with medical history significant for CABG, HTN, DM, PAD. - Patient presents to ED secondary to complaints of dizziness, lightheadedness, near syncope, patient reports diarrhea, nausea, poor appetite over last 4 days since Sunday, reports diarrhea is very watery, multiple times, large amount, brown in color, he does report nausea, poor appetite as well, reports vomiting x 1, denies fever, chills, he does report headache and myalgias, patient went to urgent care, tested negative for COVID and flu, but EKG did show right bundle branch block, CTA was sent to ED for further evaluation. - In ED labs were significant for hyponatremia at 132, bicarb of 14, BUN of 63, creatinine of 3.54, white blood cell count within normal limit, blood pressure was found to be soft, was found to have thick in the left neck area, patient reports he does not think it is more than 1 to 2 days even though he is unaware of it being there, has AKI, triage hospitalist consulted to admit.    Review of systems:      A full 10 point Review of Systems was done, except as stated above, all other Review of Systems were negative.   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      Past Surgical History:  Procedure Laterality Date   CARDIAC CATHETERIZATION N/A 04/14/2016   Procedure: Left Heart Cath and Coronary Angiography;  Surgeon: Peter M Swaziland, MD;  Location: [facility] INVASIVE CV LAB;  Service: Cardiovascular;  Laterality: N/A;   COLONOSCOPY WITH PROPOFOL  N/A 01/09/2019   Procedure: COLONOSCOPY WITH PROPOFOL ;  Surgeon: Suzette Espy, MD;  Location: [facility] ENDO SUITE;  Service: Endoscopy;  Laterality: N/A;  8:30am   CORONARY ARTERY BYPASS GRAFT N/A 04/22/2016   Procedure: CORONARY ARTERY BYPASS GRAFTING (CABG)x4 with endoscopic harvesting of right saphenous vein SVG to diagonal SVG to PDA Left Radial artery to OM 2 LIMA to LAD;  Surgeon: Zelphia Higashi, MD;  Location: [facility] OR;  Service: Open Heart Surgery;  Laterality: N/A;   DRUG INDUCED ENDOSCOPY Bilateral 09/28/2022   Procedure: DRUG INDUCED ENDOSCOPY;  Surgeon: Virgina Grills, MD;  Location: [facility] OR;  Service: ENT;  Laterality: Bilateral;   EYE SURGERY Bilateral 2023   cataract surgery   IMPLANTATION OF HYPOGLOSSAL NERVE STIMULATOR Right 11/17/2022   Procedure: IMPLANTATION OF HYPOGLOSSAL NERVE STIMULATOR;  Surgeon: Virgina Grills, MD;  Location: [facility] OR;  Service: ENT;  Laterality: Right;   RADIAL ARTERY HARVEST Left 04/22/2016   Procedure: LEFT RADIAL ARTERY HARVEST;  Surgeon: Zelphia Higashi, MD;  Location[facility] OR;  Service: Open Heart Surgery;  Laterality: Left;   TEE WITHOUT CARDIOVERSION N/A 04/22/2016   Procedure: TRANSESOPHAGEAL ECHOCARDIOGRAM (TEE);  Surgeon: Zelphia Higashi, MD;  Location: [facility] OR;  Service: Open Heart Surgery;  Laterality: N/A;      Social History:     Social History   Tobacco Use   Smoking status: Never    Passive exposure: Past   Smokeless tobacco: Never  Substance Use Topics   Alcohol use: No       Family History :     Family History  Problem Relation  Age of Onset   Coronary artery disease Father 1       CABG   Cancer Father    Coronary artery disease Brother 62       CABG   Colon cancer Neg Hx    Colon polyps Neg Hx     Home Medications:   Prior to Admission medications   Medication Sig Start Date End Date Taking? Authorizing Provider  amLODipine  (NORVASC ) 5 MG tablet Take 5 mg by mouth 2 (two) times daily.   Yes [provider]  clopidogrel  (PLAVIX ) 75 MG tablet Take 1 tablet (75 mg total) by mouth daily. 05/10/19  Yes Croitoru, Mihai, MD  Coenzyme Q10 (COQ-10) 100 MG CAPS Take 100 mg by mouth daily.   Yes [provider]  diclofenac (VOLTAREN) 75 MG EC tablet Take 75 mg by mouth 2 (two) times daily. 09/24/22  Yes [provider]  empagliflozin  (JARDIANCE ) 10 MG TABS tablet Take 1 tablet (10 mg total) by mouth daily before breakfast. 10/08/23  Yes Croitoru, Mihai, MD  EPINEPHrine  0.3 mg/0.3 mL IJ SOAJ injection Inject 0.3 mg into the muscle as needed for anaphylaxis. 01/10/23  Yes Zammit, Joseph, MD  fenofibrate  160 MG tablet Take 160 mg by mouth daily.   Yes [provider]  Flaxseed, Linseed, (FLAX SEEDS PO) Take 1-2 capsules by mouth See admin instructions. Take 2 capsules in the morning and 1 capsule in the evening   Yes [provider]  glimepiride  (AMARYL ) 4 MG tablet Take 4 mg by mouth 2 (two) times daily.   Yes [provider]  metFORMIN  (GLUCOPHAGE ) 1000 MG tablet Take 1,000 mg by mouth 2 (two) times daily with a meal.   Yes [provider]  metoprolol  tartrate (LOPRESSOR ) 25 MG tablet Take 25 mg by mouth 2 (two) times daily. 08/25/23  Yes [provider]  Multiple Vitamin (MULTIVITAMIN WITH MINERALS) TABS tablet Take 1 tablet by mouth daily.   Yes [provider]  oxyCODONE  (ROXICODONE ) 15 MG immediate release tablet Take 15 mg by mouth every 6 (six) hours as needed for pain. 09/22/23  Yes [provider]  pantoprazole  (PROTONIX ) 40 MG tablet  TAKE 1 TABLET EVERY DAY 05/10/18  Yes Kilroy, Luke K, PA-C  ramipril  (ALTACE ) 10 MG capsule Take 1 capsule (10  mg total) by mouth daily. 07/30/22  Yes Croitoru, Mihai, MD  rosuvastatin  (CRESTOR ) 20 MG tablet Take 1 tablet (20 mg total) by mouth daily. 09/14/23  Yes Croitoru, Karyl Paget, MD     Allergies:     Allergies  Allergen Reactions   Bee Venom Anaphylaxis and Itching     Physical Exam:   Vitals  Blood pressure 122/65, pulse 81, temperature 97.8 F (36.6 C), temperature source Oral, resp. rate 19, height 6' (1.829 m), weight 97.5 kg, SpO2 97%.   1. General Well-developed male, frail, laying in bed in no apparent distress  2. Normal affect and insight, Not Suicidal or Homicidal, Awake Alert, Oriented X 3.  3. No F.N deficits, ALL C.Nerves Intact, Strength 5/5 all 4 extremities, Sensation intact all 4 extremities, Plantars down going.  4. Ears and Eyes appear Normal, Conjunctivae clear, PERRLA. Moist Oral Mucosa.  5. Supple Neck, No JVD, No cervical lymphadenopathy appriciated, No Carotid Bruits.  6. Symmetrical Chest wall movement, Good air movement bilaterally, CTAB.  7. RRR, No Gallops, Rubs or Murmurs, No Parasternal Heave.  8. Positive Bowel Sounds, Abdomen Soft, No tenderness, No organomegaly appriciated,No rebound -guarding or rigidity.  9.  No Cyanosis, Normal Skin Turgor, No Skin Rash or Bruise.  10. Good muscle tone,  joints appear normal , no effusions, Normal ROM.  Tick(appears Lone star) present in left neck area( was removed)    Data Review:    CBC Recent Labs  Lab 10/13/23 1521  WBC 8.0  HGB 13.0  HCT 40.3  PLT 190  MCV 92.2  MCH 29.7  MCHC 32.3  RDW 14.0  LYMPHSABS 1.3  MONOABS 0.8  EOSABS 0.1  BASOSABS 0.0   ------------------------------------------------------------------------------------------------------------------  Chemistries  Recent Labs  Lab 10/13/23 1521  NA 132*  K 4.7  CL 105  CO2 14*  GLUCOSE 162*  BUN 63*  CREATININE  3.54*  CALCIUM  8.1*  AST 23  ALT 23  ALKPHOS 48  BILITOT 0.5   ------------------------------------------------------------------------------------------------------------------ estimated creatinine clearance is 23.5 mL/min (A) (by C-G formula based on SCr of 3.54 mg/dL (H)). ------------------------------------------------------------------------------------------------------------------ No results for input(s): "TSH", "T4TOTAL", "T3FREE", "THYROIDAB" in the last 72 hours.  Invalid input(s): "FREET3"  Coagulation profile No results for input(s): "INR", "PROTIME" in the last 168 hours. ------------------------------------------------------------------------------------------------------------------- No results for input(s): "DDIMER" in the last 72 hours. -------------------------------------------------------------------------------------------------------------------  Cardiac Enzymes No results for input(s): "CKMB", "TROPONINI", "MYOGLOBIN" in the last 168 hours.  Invalid input(s): "CK" ------------------------------------------------------------------------------------------------------------------ No results found for: "BNP"   ---------------------------------------------------------------------------------------------------------------  Urinalysis    Component Value Date/Time   COLORURINE YELLOW 08/08/2022 2003   APPEARANCEUR HAZY (A) 08/08/2022 2003   LABSPEC 1.020 08/08/2022 2003   PHURINE 5.0 08/08/2022 2003   GLUCOSEU NEGATIVE 08/08/2022 2003   HGBUR NEGATIVE 08/08/2022 2003   BILIRUBINUR NEGATIVE 08/08/2022 2003   KETONESUR NEGATIVE 08/08/2022 2003   PROTEINUR 30 (A) 08/08/2022 2003   NITRITE NEGATIVE 08/08/2022 2003   LEUKOCYTESUR NEGATIVE 08/08/2022 2003    ----------------------------------------------------------------------------------------------------------------   Imaging Results:    No results found.  EKG:  Vent. rate 82 BPM PR interval 147  ms QRS duration 136 ms QT/QTcB 411/480 ms P-R-T axes 23 220 22 Sinus rhythm Right bundle branch block Inferolateral infarct, old since last tracing no significant change Confirmed by Early Glisson ([id number]) on 10/13/2023 3:26:37 PM  Assessment & Plan:    Principal Problem:   AKI (acute kidney injury) (HCC) Active Problems:   CAD (coronary artery disease)   Non-insulin  dependent type 2 diabetes  mellitus (HCC)   Dyslipidemia   PAD (peripheral artery disease) (HCC)   S/P CABG x 4   HTN (hypertension)   OSA (obstructive sleep apnea)    AKI on CKD stage IIIb Non-anion gap metabolic acidosis - This is due to volume depletion, dehydration in the setting of diarrhea while on antihypertensive medications, diabetic medications including Jardiance , metformin  and Ramipril . - Will check UA - No evidence of retention on bladder scan. - Avoid nephrotoxic medications. - Continue with IV fluids. - Avoid low blood pressure - Will start on p.o. sodium bicarb  Near syncope Dizziness -In the setting of low blood pressure and orthostasis due to diarrhea, dehydration while on antihypertensive regimen - Continue to monitor on telemetry. - Hold antihypertensive medications. - Continue with IV fluids - Recent echo 09/28/2023 with a preserved EF, no regional wall motion abnormalities, no need to repeat  CAD status post CABG - He denies any chest pain or shortness of breath. - Continue with Plavix  - Resume metoprolol  and ramipril  when renal function and blood pressure improves - Home statin when stable - No acute finding EKG, right bundle branch block is chronic.  Diarrhea - Afebrile, no leukocytosis, abdominal exam is benign, will check C. difficile and GI panel  Hyperlipidemia - Resume statin when stable  Hypertension - Hold home medication given blood pressure is soft and he has AKI, but will keep on as needed hydralazine  OSA - S/p inspire  Hyponatremia -Volume depletion, continue  with IV fluids  Tick bite - Will start on doxycycline  10 days course-instructed staff to examine whole body for any other takes  diabetes mellitus, type II - Hold metformin , glipizide and Jardiance   - Will check A1c - Will keep an insulin  sliding scale   DVT Prophylaxis Heparin    AM Labs Ordered, also please review Full Orders  Family Communication: Admission, patients condition and plan of care including tests being ordered have been discussed with the patient  who indicate understanding and agree with the plan and Code Status.  Code Status full  Likely DC to  home  Consults called: none    Admission status: inpatient    Time spent in minutes : 70 minutes   Seena Dadds M.D on 10/13/2023 at 6:20 PM   [facility] - Office  [phone]

## 2023-10-14 DIAGNOSIS — G4733 Obstructive sleep apnea (adult) (pediatric): Secondary | ICD-10-CM | POA: Diagnosis not present

## 2023-10-14 DIAGNOSIS — I10 Essential (primary) hypertension: Secondary | ICD-10-CM

## 2023-10-14 DIAGNOSIS — N179 Acute kidney failure, unspecified: Secondary | ICD-10-CM | POA: Diagnosis not present

## 2023-10-14 DIAGNOSIS — E785 Hyperlipidemia, unspecified: Secondary | ICD-10-CM

## 2023-10-14 DIAGNOSIS — Z951 Presence of aortocoronary bypass graft: Secondary | ICD-10-CM

## 2023-10-14 DIAGNOSIS — E119 Type 2 diabetes mellitus without complications: Secondary | ICD-10-CM

## 2023-10-14 DIAGNOSIS — E860 Dehydration: Secondary | ICD-10-CM

## 2023-10-14 DIAGNOSIS — I25118 Atherosclerotic heart disease of native coronary artery with other forms of angina pectoris: Secondary | ICD-10-CM

## 2023-10-14 LAB — GLUCOSE, CAPILLARY
Glucose-Capillary: 89 mg/dL (ref 70–99)
Glucose-Capillary: 151 mg/dL — ABNORMAL HIGH (ref 70–99)
Glucose-Capillary: 137 mg/dL — ABNORMAL HIGH (ref 70–99)
Glucose-Capillary: 149 mg/dL — ABNORMAL HIGH (ref 70–99)

## 2023-10-14 LAB — GASTROINTESTINAL PANEL BY PCR, STOOL (REPLACES STOOL CULTURE)

## 2023-10-14 LAB — CBC
HCT: 38.9 % — ABNORMAL LOW (ref 39.0–52.0)
Hemoglobin: 12.7 g/dL — ABNORMAL LOW (ref 13.0–17.0)
MCH: 29.4 pg (ref 26.0–34.0)
MCHC: 32.6 g/dL (ref 30.0–36.0)
MCV: 90 fL (ref 80.0–100.0)
Platelets: 162 10*3/uL (ref 150–400)
RBC: 4.32 MIL/uL (ref 4.22–5.81)
RDW: 13.8 % (ref 11.5–15.5)
WBC: 4.9 10*3/uL (ref 4.0–10.5)
nRBC: 0 % (ref 0.0–0.2)

## 2023-10-14 LAB — BASIC METABOLIC PANEL WITH GFR
Anion gap: 8 (ref 5–15)
BUN: 51 mg/dL — ABNORMAL HIGH (ref 8–23)
CO2: 14 mmol/L — ABNORMAL LOW (ref 22–32)
Calcium: 8.2 mg/dL — ABNORMAL LOW (ref 8.9–10.3)
Chloride: 112 mmol/L — ABNORMAL HIGH (ref 98–111)
Creatinine, Ser: 2.08 mg/dL — ABNORMAL HIGH (ref 0.61–1.24)
GFR, Estimated: 34 mL/min — ABNORMAL LOW (ref >60)
Glucose, Bld: 66 mg/dL — ABNORMAL LOW (ref 70–99)
Potassium: 4.2 mmol/L (ref 3.5–5.1)
Sodium: 134 mmol/L — ABNORMAL LOW (ref 135–145)

## 2023-10-14 LAB — HIV ANTIBODY (ROUTINE TESTING W REFLEX): HIV Screen 4th Generation wRfx: NONREACTIVE

## 2023-10-14 MED ORDER — SODIUM BICARBONATE 650 MG PO TABS
1300.0000 mg | ORAL_TABLET | Freq: Two times a day (BID) | ORAL | Status: DC
  Administered 2023-10-14 – 2023-10-15 (×3): 1300 mg via ORAL
  Filled 2023-10-14 (×3): qty 2

## 2023-10-14 MED ORDER — ACETAMINOPHEN 325 MG PO TABS
650.0000 mg | ORAL_TABLET | Freq: Four times a day (QID) | ORAL | Status: DC | PRN
  Administered 2023-10-14: 650 mg via ORAL
  Filled 2023-10-14: qty 2

## 2023-10-14 NOTE — Progress Notes (Signed)
 Progress Note   Patient: Jonathan Shepard QMV:[id number] DOB: 1950/08/02 DOA: 10/13/2023     1 DOS: the patient was seen and examined on 10/14/2023   Brief hospital admission narrative: As per H&P written by Dr. Osborne Blazer on 10/13/2023 Jonathan Shepard  is a 73 y.o. male,  with medical history significant for CABG, HTN, DM, PAD. - Patient presents to ED secondary to complaints of dizziness, lightheadedness, near syncope, patient reports diarrhea, nausea, poor appetite over last 4 days since Sunday, reports diarrhea is very watery, multiple times, large amount, brown in color, he does report nausea, poor appetite as well, reports vomiting x 1, denies fever, chills, he does report headache and myalgias, patient went to urgent care, tested negative for COVID and flu, but EKG did show right bundle branch block, CTA was sent to ED for further evaluation. - In ED labs were significant for hyponatremia at 132, bicarb of 14, BUN of 63, creatinine of 3.54, white blood cell count within normal limit, blood pressure was found to be soft, was found to have thick in the left neck area, patient reports he does not think it is more than 1 to 2 days even though he is unaware of it being there, has AKI, triage hospitalist consulted to admit.  Assessment and plan 1-AKI on CKD stage 3a - With positive non-anion gap metabolic acidosis present at time of admission - Appears to be prerenal azotemia in the setting of dehydration and GI losses - Continue to minimize nephrotoxic agents - Maintain adequate hydration - Follow renal function trend and avoid hypotension.  2-near syncope/dizziness - Associated with GI losses and dehydration - Improved and for the most part is stabilized after fluid resuscitation - Continue to follow vital signs.  3-diarrhea - Most likely viral gastroenteritis - GI panel has been ordered and final results pending - C. difficile negative - Continue supportive care and maintain adequate  hydration. - Serum bicarbonate has been adjusted to help with non-anion gap metabolic acidosis driven by diarrhea.  4-history of coronary artery disease - No chest pain, no shortness of breath - Continue the use of Plavix  - Planning to resume metoprolol , ramipril  and statin once a stable and thoroughly rehydrated. - EKG and telemetry not demonstrating acute ischemic changes.  5-hyperlipidemia - Clinic to resume statin at discharge.  6-hypertension - Holding antihypertensive agents in the setting of acute kidney injury, soft blood pressure and dehydration - Follow vital signs - Heart healthy/low-sodium diet discussed with patient.  7-hyponatremia/dehydration - Continue fluid resuscitation.  8-tick bite - Will continue empirical treatment with doxycycline  - Follow clinical response.  9-obstructive sleep apnea - Status post inspire - Continue outpatient follow-up.  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 -A1c 7.8.  Subjective:  Feeling better, no chest pain, reports no nausea, vomiting or further diarrhea.  Renal function improving but is still not back to baseline.  Physical Exam: Vitals:   10/13/23 2027 10/14/23 0016 10/14/23 0412 10/14/23 1441  BP: (!) 107/55 (!) 120/57 132/68 (!) 147/79  Pulse: 88 93 99 (!) 105  Resp: 18     Temp: 98.3 F (36.8 C) 98 F (36.7 C) 98.5 F (36.9 C) 98.2 F (36.8 C)  TempSrc: Oral Oral Oral Oral  SpO2: 97% 97% 99% 99%  Weight:      Height:       General exam: Alert, awake, oriented  x 3 Respiratory system: Clear to auscultation. Respiratory effort normal. Cardiovascular system:RRR. No murmurs, rubs, gallops. Gastrointestinal system: Abdomen is nondistended, soft and nontender. No organomegaly or masses felt. Normal bowel sounds heard. Central nervous system: Alert and oriented. No focal  neurological deficits. Extremities: No C/C/E, +pedal pulses Skin: No rashes, petechiae or indurations. Psychiatry: Judgement and insight appear normal. Mood & affect appropriate.    Data Reviewed: Basic metabolic panel: Sodium 134, potassium 4.2, chloride 112, bicarb 14, BUN 51, creatinine 2.08 and GFR 34 CBC: White blood cell 4.9, hemoglobin 12.7 and platelet count 162K  Family Communication: No family at bedside.  Disposition: Status is: Inpatient Remains inpatient appropriate because: Continue IV therapy and follow renal function trend.  Time spent: 50` minutes  Author: Justina Oman, MD 10/14/2023 5:07 PM  For on call review [email or web address].

## 2023-10-14 NOTE — Plan of Care (Signed)

## 2023-10-14 NOTE — Evaluation (Signed)
 Physical Therapy Evaluation Patient Details Name: Jonathan Shepard MRN: [id number] DOB: 1951-02-02 Today's Date: 10/14/2023  History of Present Illness  Jonathan Shepard  is a 73 y.o. male,  with medical history significant for CABG, HTN, DM, PAD.  - Patient presents to ED secondary to complaints of dizziness, lightheadedness, near syncope, patient reports diarrhea, nausea, poor appetite over last 4 days since Sunday, reports diarrhea is very watery, multiple times, large amount, brown in color, he does report nausea, poor appetite as well, reports vomiting x 1, denies fever, chills, he does report headache and myalgias, patient went to urgent care, tested negative for COVID and flu, but EKG did show right bundle branch block, CTA was sent to ED for further evaluation.   Clinical Impression  Patient functioning at baseline for functional mobility and gait demonstrating good return for ambulating in room, hallways without loss of balance or need for an AD. Plan:  Patient discharged from physical therapy to care of nursing for ambulation daily as tolerated for length of stay.          If plan is discharge home, recommend the following: Other (comment) (at baseline)   Can travel by private vehicle        Equipment Recommendations None recommended by PT  Recommendations for Other Services       Functional Status Assessment Patient has not had a recent decline in their functional status     Precautions / Restrictions Precautions Precautions: None Restrictions Weight Bearing Restrictions Per Provider Order: No      Mobility  Bed Mobility Overal bed mobility: Independent                  Transfers Overall transfer level: Independent                      Ambulation/Gait Ambulation/Gait assistance: Independent Gait Distance (Feet): 150 Feet Assistive device: None Gait Pattern/deviations: WFL(Within Functional Limits) Gait velocity: normal     General Gait  Details: grossly WFL with good return for ambulating in room, hallways without loss of balance or need for an AD  Stairs            Wheelchair Mobility     Tilt Bed    Modified Rankin (Stroke Patients Only)       Balance Overall balance assessment: Independent                                           Pertinent Vitals/Pain Pain Assessment Pain Assessment: No/denies pain    Home Living Family/patient expects to be discharged to:: Private residence Living Arrangements: Spouse/significant other Available Help at Discharge: Family;Available 24 hours/day Type of Home: House Home Access: Level entry     Alternate Level Stairs-Number of Steps: 18 steps to go upstairs Home Layout: Multi-level;Laundry or work area in [employer]: None      Prior Function Prior Level of Function : Independent/Modified Independent;Driving             Mobility Comments: Community ambulation without AD, drives, shops ADLs Comments: Independent     Extremity/Trunk Assessment   Upper Extremity Assessment Upper Extremity Assessment: Overall WFL for tasks assessed    Lower Extremity Assessment Lower Extremity Assessment: Overall WFL for tasks assessed    Cervical / Trunk Assessment Cervical / Trunk Assessment: Normal  Communication   Communication Communication: No  apparent difficulties    Cognition Arousal: Alert Behavior During Therapy: WFL for tasks assessed/performed   PT - Cognitive impairments: No apparent impairments                         Following commands: Intact       Cueing Cueing Techniques: Verbal cues     General Comments      Exercises     Assessment/Plan    PT Assessment Patient does not need any further PT services  PT Problem List         PT Treatment Interventions      PT Goals (Current goals can be found in the Care Plan section)  Acute Rehab PT Goals Patient Stated Goal: return home PT Goal  Formulation: With patient Time For Goal Achievement: 10/14/23 Potential to Achieve Goals: Good    Frequency       Co-evaluation               AM-PAC PT "6 Clicks" Mobility  Outcome Measure Help needed turning from your back to your side while in a flat bed without using bedrails?: None Help needed moving from lying on your back to sitting on the side of a flat bed without using bedrails?: None Help needed moving to and from a bed to a chair (including a wheelchair)?: None Help needed standing up from a chair using your arms (e.g., wheelchair or bedside chair)?: None Help needed to walk in hospital room?: None Help needed climbing 3-5 steps with a railing? : None 6 Click Score: 24    End of Session   Activity Tolerance: Patient tolerated treatment well Patient left: in bed Nurse Communication: Mobility status PT Visit Diagnosis: Unsteadiness on feet (R26.81);Other abnormalities of gait and mobility (R26.89);Muscle weakness (generalized) (M62.81)    Time: 1610-9604 PT Time Calculation (min) (ACUTE ONLY): 17 min   Charges:   PT Evaluation $PT Eval Low Complexity: 1 Low PT Treatments $Therapeutic Activity: 8-22 mins PT General Charges $$ ACUTE PT VISIT: 1 Visit         3:36 PM, 10/14/23 Jonathan Shepard, MPT Physical Therapist with [facility] 336 [phone] office [phone] mobile phone

## 2023-10-15 DIAGNOSIS — G4733 Obstructive sleep apnea (adult) (pediatric): Secondary | ICD-10-CM | POA: Diagnosis not present

## 2023-10-15 DIAGNOSIS — E119 Type 2 diabetes mellitus without complications: Secondary | ICD-10-CM | POA: Diagnosis not present

## 2023-10-15 DIAGNOSIS — N179 Acute kidney failure, unspecified: Secondary | ICD-10-CM | POA: Diagnosis not present

## 2023-10-15 DIAGNOSIS — E785 Hyperlipidemia, unspecified: Secondary | ICD-10-CM | POA: Diagnosis not present

## 2023-10-15 DIAGNOSIS — E860 Dehydration: Secondary | ICD-10-CM

## 2023-10-15 DIAGNOSIS — A0832 Astrovirus enteritis: Secondary | ICD-10-CM

## 2023-10-15 LAB — GLUCOSE, CAPILLARY
Glucose-Capillary: 126 mg/dL — ABNORMAL HIGH (ref 70–99)
Glucose-Capillary: 182 mg/dL — ABNORMAL HIGH (ref 70–99)

## 2023-10-15 LAB — BASIC METABOLIC PANEL WITH GFR
Anion gap: 6 (ref 5–15)
BUN: 23 mg/dL (ref 8–23)
CO2: 17 mmol/L — ABNORMAL LOW (ref 22–32)
Calcium: 8.4 mg/dL — ABNORMAL LOW (ref 8.9–10.3)
Chloride: 112 mmol/L — ABNORMAL HIGH (ref 98–111)
Creatinine, Ser: 1.16 mg/dL (ref 0.61–1.24)
GFR, Estimated: >60 mL/min (ref >60)
Glucose, Bld: 130 mg/dL — ABNORMAL HIGH (ref 70–99)
Potassium: 4 mmol/L (ref 3.5–5.1)
Sodium: 135 mmol/L (ref 135–145)

## 2023-10-15 MED ORDER — AMLODIPINE BESYLATE 5 MG PO TABS: 5.0000 mg | ORAL_TABLET | Freq: Every day | ORAL | Status: AC

## 2023-10-15 MED ORDER — SODIUM BICARBONATE 650 MG PO TABS: 650.0000 mg | ORAL_TABLET | Freq: Two times a day (BID) | ORAL | 0 refills | Status: AC

## 2023-10-15 MED ORDER — DOXYCYCLINE HYCLATE 100 MG PO TABS: 100.0000 mg | ORAL_TABLET | Freq: Two times a day (BID) | ORAL | 0 refills | Status: AC

## 2023-10-15 NOTE — Discharge Summary (Signed)
 Physician Discharge Summary   Patient: Jonathan Shepard MRN: [id number] DOB: 02-21-1951  Admit date:     10/13/2023  Discharge date: 10/15/23  Discharge Physician: Justina Oman   PCP: Kathyleen Parkins, MD   Recommendations at discharge:  Reassess blood pressure and adjust antihypertensive regimen as needed Repeat basic metabolic panel to follow ultralights renal function Continue close monitoring of patient's CBGs/A1c with further adjustment to hypoglycemia regimen as needed  Discharge Diagnoses: Principal Problem:   Acute renal failure (HCC) Active Problems:   CAD (coronary artery disease)   Non-insulin  dependent type 2 diabetes mellitus (HCC)   Dyslipidemia   PAD (peripheral artery disease) (HCC)   S/P CABG x 4   HTN (hypertension)   OSA (obstructive sleep apnea)   Severe dehydration   Enteritis due to astrovirus  Brief hospital admission narrative: As per H&P written by Dr. Osborne Blazer on 10/13/2023 Jonathan Shepard  is a 73 y.o. male,  with medical history significant for CABG, HTN, DM, PAD. - Patient presents to ED secondary to complaints of dizziness, lightheadedness, near syncope, patient reports diarrhea, nausea, poor appetite over last 4 days since Sunday, reports diarrhea is very watery, multiple times, large amount, brown in color, he does report nausea, poor appetite as well, reports vomiting x 1, denies fever, chills, he does report headache and myalgias, patient went to urgent care, tested negative for COVID and flu, but EKG did show right bundle branch block, CTA was sent to ED for further evaluation. - In ED labs were significant for hyponatremia at 132, bicarb of 14, BUN of 63, creatinine of 3.54, white blood cell count within normal limit, blood pressure was found to be soft, was found to have thick in the left neck area, patient reports he does not think it is more than 1 to 2 days even though he is unaware of it being there, has AKI, triage hospitalist consulted to  admit.  Assessment and Plan: 1-AKI on CKD stage 3a - With positive non-anion gap metabolic acidosis present at time of admission - Appears to be prerenal azotemia in the setting of dehydration and GI losses - Continue to minimize the use of nephrotoxic agents - Continue to maintain adequate hydration - Repeat basic metabolic panel follow-up visit to assess renal function trend. -At discharge patient renal function back to his baseline.   2-near syncope/dizziness - Associated with GI losses and dehydration - Improved and for the most part is stabilized after fluid resuscitation - Continue to follow vital signs. -Patient advised to maintain adequate hydration - No dizziness, lightheadedness or any near syncope complaints.   3-diarrhea - Secondary to astrovirus enteritis - C. difficile negative - Serum bicarbonate has been adjusted to help with non-anion gap metabolic acidosis driven by diarrhea; planning to provide 1 week of therapy at discharge. - Patient advised to maintain adequate hydration.   4-history of coronary artery disease - No chest pain, no shortness of breath - Continue the use of Plavix  - Planning to resume metoprolol , ramipril  and statin  - Heart healthy diet discussed with patient - Continue patient follow-up with cardiology service.   5-hyperlipidemia - Continue statin at discharge.   6-hypertension - Stable and improved - Resume home antihypertensive regimen at discharge - Patient advised to follow heart healthy/low-sodium diet.   7-hyponatremia/dehydration - Continue fluid resuscitation.   8-tick bite - Will continue empirical treatment with doxycycline ; 5 more days pending at discharge. - Follow clinical response. -Patient advised to maintain adequate hydration and to take care  of any possible infestation at home.   9-obstructive sleep apnea - Status post inspire - Continue outpatient follow-up.   10-type 2 diabetes mellitus with  nephropathy -Resume home oral hypoglycemic agents at discharge. - Continue to follow CBG fluctuation and and adjust hypoglycemic treatment as required. -A1c 7.8. -Modified carbohydrate diet discussed with patient.   Consultants: None Procedures performed: See below for x-ray reports. Disposition: Home Diet recommendation: Heart healthy/modified carbohydrate diet.  DISCHARGE MEDICATION: Allergies as of 10/15/2023       Reactions   Bee Venom Anaphylaxis, Itching        Medication List     STOP taking these medications    CoQ-10 100 MG Caps   diclofenac 75 MG EC tablet Commonly known as: VOLTAREN   FLAX SEEDS PO       TAKE these medications    amLODipine  5 MG tablet Commonly known as: NORVASC  Take 1 tablet (5 mg total) by mouth daily. What changed: when to take this   clopidogrel  75 MG tablet Commonly known as: PLAVIX  Take 1 tablet (75 mg total) by mouth daily.   doxycycline  100 MG tablet Commonly known as: VIBRA -TABS Take 1 tablet (100 mg total) by mouth every 12 (twelve) hours for 5 days.   empagliflozin  10 MG Tabs tablet Commonly known as: Jardiance  Take 1 tablet (10 mg total) by mouth daily before breakfast.   EPINEPHrine  0.3 mg/0.3 mL Soaj injection Commonly known as: EPI-PEN Inject 0.3 mg into the muscle as needed for anaphylaxis.   fenofibrate  160 MG tablet Take 160 mg by mouth daily.   glimepiride  4 MG tablet Commonly known as: AMARYL  Take 4 mg by mouth 2 (two) times daily.   metFORMIN  1000 MG tablet Commonly known as: GLUCOPHAGE  Take 1,000 mg by mouth 2 (two) times daily with a meal.   metoprolol  tartrate 25 MG tablet Commonly known as: LOPRESSOR  Take 25 mg by mouth 2 (two) times daily.   multivitamin with minerals Tabs tablet Take 1 tablet by mouth daily.   oxyCODONE  15 MG immediate release tablet Commonly known as: ROXICODONE  Take 15 mg by mouth every 6 (six) hours as needed for pain.   pantoprazole  40 MG tablet Commonly known as:  PROTONIX  TAKE 1 TABLET EVERY DAY   ramipril  10 MG capsule Commonly known as: ALTACE  Take 1 capsule (10 mg total) by mouth daily.   rosuvastatin  20 MG tablet Commonly known as: CRESTOR  Take 1 tablet (20 mg total) by mouth daily.   sodium bicarbonate  650 MG tablet Take 1 tablet (650 mg total) by mouth 2 (two) times daily for 7 days.        Follow-up Information     Kathyleen Parkins, MD. Schedule an appointment as soon as possible for a visit in 10 day(s).   Specialty: Internal Medicine Contact information: [address] [place] [address] [phone]                Discharge Exam: Jonathan Shepard   10/13/23 1519 10/13/23 1844  Weight: 97.5 kg 94.3 kg   General exam: Alert, awake, oriented x 3 Respiratory system: Clear to auscultation. Respiratory effort normal. Cardiovascular system:RRR. No murmurs, rubs, gallops. Gastrointestinal system: Abdomen is nondistended, soft and nontender. No organomegaly or masses felt. Normal bowel sounds heard. Central nervous system: Alert and oriented. No focal neurological deficits. Extremities: No C/C/E, +pedal pulses Skin: No rashes, lesions or ulcers Psychiatry: Judgement and insight appear normal. Mood & affect appropriate.    Condition at discharge: Stable and improved.  The results of significant diagnostics from this hospitalization (including imaging, microbiology, ancillary and laboratory) are listed below for reference.   Imaging Studies: ECHOCARDIOGRAM COMPLETE Result Date: 09/28/2023    ECHOCARDIOGRAM REPORT   Patient Name:   TAIVON TOOMES Date of Exam: 09/28/2023 Medical Rec #:  [id number]         Height:       72.0 in Accession #:    [id number]        Weight:       220.0 lb Date of Birth:  Oct 30, 1950        BSA:          2.219 m Patient Age:    72 years          BP:           153/76 mmHg Patient Gender: M                 HR:           77 bpm. Exam Location:  [facility] Procedure: 2D Echo, Cardiac Doppler,  Color Doppler and Intracardiac            Opacification Agent (Both Spectral and Color Flow Doppler were            utilized during procedure). Indications:    Chronic diastolic heart failure (HCC) [428.32.ICD-9-CM]  History:        Patient has prior history of Echocardiogram examinations, most                 recent 12/23/2009. CAD, Prior CABG; Risk Factors:Hypertension,                 Diabetes and Dyslipidemia.  Sonographer:    Denese Finn RCS Referring Phys: [phone] MIHAI CROITORU IMPRESSIONS  1. Left ventricular ejection fraction, by estimation, is 65 to 70%. The left ventricle has normal function. The left ventricle has no regional wall motion abnormalities. There is moderate left ventricular hypertrophy. Left ventricular diastolic parameters are consistent with Grade I diastolic dysfunction (impaired relaxation).  2. Right ventricular systolic function is normal. The right ventricular size is normal.  3. Left atrial size was mildly dilated.  4. The mitral valve is normal in structure. No evidence of mitral valve regurgitation. No evidence of mitral stenosis.  5. The aortic valve is tricuspid. There is mild calcification of the aortic valve. There is mild thickening of the aortic valve. Aortic valve regurgitation is not visualized. No aortic stenosis is present.  6. The inferior vena cava is normal in size with greater than 50% respiratory variability, suggesting right atrial pressure of 3 mmHg. FINDINGS  Left Ventricle: Left ventricular ejection fraction, by estimation, is 65 to 70%. The left ventricle has normal function. The left ventricle has no regional wall motion abnormalities. Definity  contrast agent was given IV to delineate the left ventricular  endocardial borders. The left ventricular internal cavity size was normal in size. There is moderate left ventricular hypertrophy. Left ventricular diastolic parameters are consistent with Grade I diastolic dysfunction (impaired relaxation). Normal left   ventricular filling pressure. Right Ventricle: The right ventricular size is normal. Right vetricular wall thickness was not well visualized. Right ventricular systolic function is normal. Left Atrium: Left atrial size was mildly dilated. Right Atrium: Right atrial size was normal in size. Pericardium: There is no evidence of pericardial effusion. Mitral Valve: The mitral valve is normal in structure. There is mild thickening of the mitral valve leaflet(s). There is mild calcification  of the mitral valve leaflet(s). Mild mitral annular calcification. No evidence of mitral valve regurgitation. No evidence of mitral valve stenosis. Tricuspid Valve: The tricuspid valve is normal in structure. Tricuspid valve regurgitation is not demonstrated. No evidence of tricuspid stenosis. Aortic Valve: The aortic valve is tricuspid. There is mild calcification of the aortic valve. There is mild thickening of the aortic valve. There is mild aortic valve annular calcification. Aortic valve regurgitation is not visualized. No aortic stenosis  is present. Aortic valve mean gradient measures 5.6 mmHg. Aortic valve peak gradient measures 12.1 mmHg. Aortic valve area, by VTI measures 3.17 cm. Pulmonic Valve: The pulmonic valve was not well visualized. Pulmonic valve regurgitation is not visualized. No evidence of pulmonic stenosis. Aorta: The aortic root is normal in size and structure and the ascending aorta was not well visualized. Venous: The inferior vena cava is normal in size with greater than 50% respiratory variability, suggesting right atrial pressure of 3 mmHg. IAS/Shunts: No atrial level shunt detected by color flow Doppler.  LEFT VENTRICLE PLAX 2D LVIDd:         4.70 cm   Diastology LVIDs:         2.50 cm   LV e' medial:    7.18 cm/s LV PW:         1.20 cm   LV E/e' medial:  13.1 LV IVS:        1.30 cm   LV e' lateral:   7.72 cm/s LVOT diam:     2.30 cm   LV E/e' lateral: 12.2 LV SV:         107 LV SV Index:   48 LVOT Area:      4.15 cm  RIGHT VENTRICLE RV S prime:     10.70 cm/s TAPSE (M-mode): 1.7 cm LEFT ATRIUM             Index        RIGHT ATRIUM           Index LA diam:        3.20 cm 1.44 cm/m   RA Area:     18.20 cm LA Vol (A2C):   91.5 ml 41.24 ml/m  RA Volume:   47.20 ml  21.27 ml/m LA Vol (A4C):   61.3 ml 27.63 ml/m LA Biplane Vol: 76.5 ml 34.48 ml/m  AORTIC VALVE AV Area (Vmax):    2.80 cm AV Area (Vmean):   3.19 cm AV Area (VTI):     3.17 cm AV Vmax:           173.76 cm/s AV Vmean:          107.856 cm/s AV VTI:            0.338 m AV Peak Grad:      12.1 mmHg AV Mean Grad:      5.6 mmHg LVOT Vmax:         117.00 cm/s LVOT Vmean:        82.800 cm/s LVOT VTI:          0.258 m LVOT/AV VTI ratio: 0.76  AORTA Ao Root diam: 3.60 cm MITRAL VALVE MV Area (PHT): 2.62 cm     SHUNTS MV Decel Time: 289 msec     Systemic VTI:  0.26 m MV E velocity: 94.10 cm/s   Systemic Diam: 2.30 cm MV A velocity: 133.00 cm/s MV E/A ratio:  0.71 Armida Lander MD Electronically signed by Armida Lander MD Signature Date/Time: 09/28/2023/11:02:35 AM  Final     Microbiology: Results for orders placed or performed during the hospital encounter of 10/13/23  C Difficile Quick Screen w PCR reflex     Status: None   Collection Time: 10/13/23  5:58 PM   Specimen: Urine, Clean Catch; Stool  Result Value Ref Range Status   C Diff antigen NEGATIVE NEGATIVE Final   C Diff toxin NEGATIVE NEGATIVE Final   C Diff interpretation No C. difficile detected.  Final    Comment: Performed at [facility], [address] [place], [place] [address]  Gastrointestinal Panel by PCR , Stool     Status: Abnormal   Collection Time: 10/13/23  5:58 PM   Specimen: Urine, Clean Catch; Stool  Result Value Ref Range Status   Campylobacter species NOT DETECTED NOT DETECTED Final   Plesimonas shigelloides NOT DETECTED NOT DETECTED Final   Salmonella species NOT DETECTED NOT DETECTED Final   Yersinia enterocolitica NOT DETECTED NOT DETECTED Final   Vibrio  species NOT DETECTED NOT DETECTED Final   Vibrio cholerae NOT DETECTED NOT DETECTED Final   Enteroaggregative E coli (EAEC) NOT DETECTED NOT DETECTED Final   Enteropathogenic E coli (EPEC) NOT DETECTED NOT DETECTED Final   Enterotoxigenic E coli (ETEC) NOT DETECTED NOT DETECTED Final   Shiga like toxin producing E coli (STEC) NOT DETECTED NOT DETECTED Final   Shigella/Enteroinvasive E coli (EIEC) NOT DETECTED NOT DETECTED Final   Cryptosporidium NOT DETECTED NOT DETECTED Final   Cyclospora cayetanensis NOT DETECTED NOT DETECTED Final   Entamoeba histolytica NOT DETECTED NOT DETECTED Final   Giardia lamblia NOT DETECTED NOT DETECTED Final   Adenovirus F40/41 NOT DETECTED NOT DETECTED Final   Astrovirus DETECTED (A) NOT DETECTED Final   Norovirus GI/GII NOT DETECTED NOT DETECTED Final   Rotavirus A NOT DETECTED NOT DETECTED Final   Sapovirus (I, II, IV, and V) NOT DETECTED NOT DETECTED Final    Comment: Performed at [facility], [address] [place], [place] [address]    Labs: CBC: Recent Labs  Lab 10/13/23 1521 10/14/23 0416  WBC 8.0 4.9  NEUTROABS 5.8  --   HGB 13.0 12.7*  HCT 40.3 38.9*  MCV 92.2 90.0  PLT 190 162   Basic Metabolic Panel: Recent Labs  Lab 10/13/23 1521 10/14/23 0416 10/15/23 0317  NA 132* 134* 135  K 4.7 4.2 4.0  CL 105 112* 112*  CO2 14* 14* 17*  GLUCOSE 162* 66* 130*  BUN 63* 51* 23  CREATININE 3.54* 2.08* 1.16  CALCIUM  8.1* 8.2* 8.4*  MG 1.7  --   --   PHOS 5.7*  --   --    Liver Function Tests: Recent Labs  Lab 10/13/23 1521  AST 23  ALT 23  ALKPHOS 48  BILITOT 0.5  PROT 6.5  ALBUMIN  3.4*   CBG: Recent Labs  Lab 10/14/23 1112 10/14/23 1635 10/14/23 2200 10/15/23 0754 10/15/23 1123  GLUCAP 151* 137* 149* 126* 182*    Discharge time spent: greater than 30 minutes.  Signed: Justina Oman, MD [facility] 10/15/2023

## 2023-10-15 NOTE — Plan of Care (Signed)
  Problem: Health Behavior/Discharge Planning: Goal: Ability to manage health-related needs will improve Outcome: Progressing   Problem: Clinical Measurements: Goal: Ability to maintain clinical measurements within normal limits will improve Outcome: Progressing   Problem: Activity: Goal: Risk for activity intolerance will decrease Outcome: Progressing   

## 2023-10-15 NOTE — Plan of Care (Signed)

## 2023-10-19 DIAGNOSIS — N179 Acute kidney failure, unspecified: Secondary | ICD-10-CM | POA: Diagnosis not present

## 2023-10-19 DIAGNOSIS — G4733 Obstructive sleep apnea (adult) (pediatric): Secondary | ICD-10-CM | POA: Diagnosis not present

## 2023-10-19 DIAGNOSIS — E1129 Type 2 diabetes mellitus with other diabetic kidney complication: Secondary | ICD-10-CM | POA: Diagnosis not present

## 2023-10-19 DIAGNOSIS — M1991 Primary osteoarthritis, unspecified site: Secondary | ICD-10-CM | POA: Diagnosis not present

## 2023-10-19 DIAGNOSIS — M5412 Radiculopathy, cervical region: Secondary | ICD-10-CM | POA: Diagnosis not present

## 2023-10-19 DIAGNOSIS — L4050 Arthropathic psoriasis, unspecified: Secondary | ICD-10-CM | POA: Diagnosis not present

## 2023-10-19 DIAGNOSIS — G894 Chronic pain syndrome: Secondary | ICD-10-CM | POA: Diagnosis not present

## 2023-10-19 DIAGNOSIS — E871 Hypo-osmolality and hyponatremia: Secondary | ICD-10-CM | POA: Diagnosis not present

## 2023-10-19 DIAGNOSIS — I10 Essential (primary) hypertension: Secondary | ICD-10-CM | POA: Diagnosis not present

## 2023-10-19 LAB — POCT I-STAT, CHEM 8
BUN: 63 mg/dL — ABNORMAL HIGH (ref 8–23)
Calcium, Ion: 1.16 mmol/L (ref 1.15–1.40)
Chloride: 110 mmol/L (ref 98–111)
Creatinine, Ser: 3.7 mg/dL — ABNORMAL HIGH (ref 0.61–1.24)
Glucose, Bld: 161 mg/dL — ABNORMAL HIGH (ref 70–99)
HCT: 43 % (ref 39.0–52.0)
Hemoglobin: 14.6 g/dL (ref 13.0–17.0)
Potassium: 4.8 mmol/L (ref 3.5–5.1)
Sodium: 134 mmol/L — ABNORMAL LOW (ref 135–145)
TCO2: 14 mmol/L — ABNORMAL LOW (ref 22–32)

## 2023-10-22 DIAGNOSIS — E871 Hypo-osmolality and hyponatremia: Secondary | ICD-10-CM | POA: Diagnosis not present

## 2023-10-22 DIAGNOSIS — I10 Essential (primary) hypertension: Secondary | ICD-10-CM | POA: Diagnosis not present

## 2023-10-22 DIAGNOSIS — E1165 Type 2 diabetes mellitus with hyperglycemia: Secondary | ICD-10-CM | POA: Diagnosis not present

## 2023-11-17 DIAGNOSIS — M25561 Pain in right knee: Secondary | ICD-10-CM | POA: Diagnosis not present

## 2023-11-22 ENCOUNTER — Ambulatory Visit (HOSPITAL_BASED_OUTPATIENT_CLINIC_OR_DEPARTMENT_OTHER): Admitting: Pulmonary Disease

## 2023-11-22 ENCOUNTER — Encounter (HOSPITAL_BASED_OUTPATIENT_CLINIC_OR_DEPARTMENT_OTHER): Payer: Self-pay | Admitting: Pulmonary Disease

## 2023-11-22 VITALS — BP 147/77 | HR 69 | Ht 72.0 in | Wt 215.0 lb

## 2023-11-22 DIAGNOSIS — G4733 Obstructive sleep apnea (adult) (pediatric): Secondary | ICD-10-CM | POA: Diagnosis not present

## 2023-11-22 DIAGNOSIS — Z9682 Presence of neurostimulator: Secondary | ICD-10-CM

## 2023-11-22 NOTE — Patient Instructions (Addendum)
  We increased to level 4 =0.5V  Stay at this level Your compliance is good Home sleep test at this level

## 2023-11-22 NOTE — Progress Notes (Signed)
 Subjective:    Patient ID: Jonathan Shepard, male    DOB: 1951-03-20, 73 y.o.   MRN: [id number]  HPI  73 yo  man for follow-up of OSA.   PMH : CAD, DM, GERD, Nephrolithiasis, HLD, HTN    He was CPAP intolerant and had hypoglossal nerve stimulator implanted on 11/17/22 [name]) - device activated 12/25/22 Start delay 45 minutes -pause time 15 minutes -duration 8 hours   02/2023 OV >>between 0.4 to 0.5 volts  Pulse width 60, rate 40   06/2023 He is at level 4 = 0.8V .  It will be difficult to achieve therapeutic amplitude with  electrode configuration A (+-+) We checked electrode configuration B with a range of 0.3-0.5 and configuration C with a range of 0.4-0.8.  Sensation 0.4 V , functional level 0.4 V lower limit, upper limit 0.8 V   Start delay at 45 minutes, pause time increased to 30 minutes, duration 9 hours On B & D configurations, he had good tongue motion but not enough for range. Set at electrode C, 0.6 V  08/2023  OV  -residual AHI high on electrode C switched back to electrode B 0.2-0.6 range   3 month FU visit  The device is set at electrode B configuration, 0.4 volts,since last OV. Compliance is high, with an average nightly use of 7.5 hours and a compliance rate of 97%. Sleep quality has improved, with no snoring unless the device is off. He occasionally pauses the device when using the restroom at night, but this does not frequently disrupt sleep. The device causes irritation only when returning to bed after waking.  Daytime sleepiness is present, allowing him to nap briefly, but it does not significantly impact daily activities. He does not notice a significant difference in symptoms compared to previous settings, except for occasional irritation at night.  His cardiologist requested a consultation regarding a sleep study,   Significant tests/ events reviewed     06/2023 HST on electrode C config 0.6 V >> AHI 30/h, low sat 70 %   Inspire titration 03/2023 >>  incoming amplitude 0.7, titrated between 0.5-1.3 V , events noted at all levels, no therapeutic amplitude identified HST January 2023 >> AHI 23.3   Review of Systems neg for any significant sore throat, dysphagia, itching, sneezing, nasal congestion or excess/ purulent secretions, fever, chills, sweats, unintended wt loss, pleuritic or exertional cp, hempoptysis, orthopnea pnd or change in chronic leg swelling. Also denies presyncope, palpitations, heartburn, abdominal pain, nausea, vomiting, diarrhea or change in bowel or urinary habits, dysuria,hematuria, rash, arthralgias, visual complaints, headache, numbness weakness or ataxia.     Objective:   Physical Exam  Gen. Pleasant, obese, in no distress ENT - no lesions, no post nasal drip Neck: No JVD, no thyromegaly, no carotid bruits Lungs: no use of accessory muscles, no dullness to percussion, decreased without rales or rhonchi  Cardiovascular: Rhythm regular, heart sounds  normal, no murmurs or gallops, no peripheral edema Musculoskeletal: No deformities, no cyanosis or clubbing , no tremors   Programming Description: Electrode B configuration increased from 0.4 volts to 0.5 volts. Tongue movement observed with mouth slightly open. Patient reported sensation in the back of the tongue. Tolerated level 4 well.     Assessment & Plan:    Obstructive Sleep Apnea (OSA) OSA managed with upper glossular nerve stimulator implant. Current settings on electrode B at 0.4 volts. He reports good compliance with device usage, averaging 7.5 hours per night. Daytime sleepiness is minimal,  with the ability to take short naps. Previous settings resulted in 30 events during a home sleep study, prompting adjustment. Current level is well-tolerated, with sensation primarily at the back of the tongue. Decision made to increase to level 4 (0.5 volts) to further reduce events, aiming for less than 15 events per night. Less than 5 events would be ideal, but less  than 15 is acceptable. - Increase stimulator setting to level 4 (0.5 volts) on electrode B configuration. - Perform a home sleep test in 4 to 6 weeks to assess efficacy at the new level. - Instruct him to monitor tolerance and step down one level if issues arise, but aim to maintain the new level. - Communicate with his cardiologist,  to inform him of current management and follow-up plans.

## 2023-11-29 ENCOUNTER — Telehealth: Payer: Self-pay

## 2023-11-29 NOTE — Telephone Encounter (Signed)
 Call to patient to discuss appointment made in error with Dr. Shlomo. Patient sees pulmonologist for management of his Inspire device, was referred back to his cardiologist for followup. Patient sees Dr. Francyne for cardiology and is scheduled for July. No answer, unable to leave VM. [facility] ([place] message sent.

## 2023-11-30 ENCOUNTER — Ambulatory Visit: Admitting: Cardiology

## 2023-11-30 DIAGNOSIS — I10 Essential (primary) hypertension: Secondary | ICD-10-CM | POA: Diagnosis not present

## 2023-11-30 DIAGNOSIS — M1991 Primary osteoarthritis, unspecified site: Secondary | ICD-10-CM | POA: Diagnosis not present

## 2023-11-30 DIAGNOSIS — G894 Chronic pain syndrome: Secondary | ICD-10-CM | POA: Diagnosis not present

## 2023-11-30 DIAGNOSIS — E1129 Type 2 diabetes mellitus with other diabetic kidney complication: Secondary | ICD-10-CM | POA: Diagnosis not present

## 2023-12-14 DIAGNOSIS — E782 Mixed hyperlipidemia: Secondary | ICD-10-CM | POA: Diagnosis not present

## 2023-12-14 DIAGNOSIS — E119 Type 2 diabetes mellitus without complications: Secondary | ICD-10-CM | POA: Diagnosis not present

## 2023-12-14 LAB — LIPID PANEL

## 2023-12-15 ENCOUNTER — Ambulatory Visit: Payer: Self-pay | Admitting: Cardiovascular Disease

## 2023-12-15 LAB — LIPID PANEL
Cholesterol, Total: 138 mg/dL (ref 100–199)
HDL: 31 mg/dL — AB (ref >39)
LDL CALC COMMENT:: 4.5 ratio (ref 0.0–5.0)
LDL Chol Calc (NIH): 62 mg/dL (ref 0–99)
Triglycerides: 279 mg/dL — AB (ref 0–149)
VLDL Cholesterol Cal: 45 mg/dL — AB (ref 5–40)

## 2023-12-15 LAB — HEMOGLOBIN A1C
Est. average glucose Bld gHb Est-mCnc: 166 mg/dL
Hgb A1c MFr Bld: 7.4 % — ABNORMAL HIGH (ref 4.8–5.6)

## 2023-12-19 ENCOUNTER — Encounter

## 2023-12-19 DIAGNOSIS — G4730 Sleep apnea, unspecified: Secondary | ICD-10-CM | POA: Diagnosis not present

## 2023-12-19 DIAGNOSIS — G4733 Obstructive sleep apnea (adult) (pediatric): Secondary | ICD-10-CM
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
## 2023-12-24 ENCOUNTER — Telehealth: Payer: Self-pay | Admitting: Pharmacy Technician

## 2023-12-24 ENCOUNTER — Other Ambulatory Visit (HOSPITAL_COMMUNITY): Payer: Self-pay

## 2023-12-24 ENCOUNTER — Ambulatory Visit: Attending: Cardiovascular Disease | Admitting: Cardiovascular Disease

## 2023-12-24 ENCOUNTER — Encounter: Payer: Self-pay | Admitting: Cardiovascular Disease

## 2023-12-24 VITALS — BP 139/69 | HR 68 | Ht 72.0 in | Wt 207.6 lb

## 2023-12-24 DIAGNOSIS — I5032 Chronic diastolic (congestive) heart failure: Secondary | ICD-10-CM | POA: Diagnosis not present

## 2023-12-24 DIAGNOSIS — I739 Peripheral vascular disease, unspecified: Secondary | ICD-10-CM

## 2023-12-24 DIAGNOSIS — I10 Essential (primary) hypertension: Secondary | ICD-10-CM

## 2023-12-24 DIAGNOSIS — Z961 Presence of intraocular lens: Secondary | ICD-10-CM | POA: Diagnosis not present

## 2023-12-24 DIAGNOSIS — E782 Mixed hyperlipidemia: Secondary | ICD-10-CM

## 2023-12-24 DIAGNOSIS — E119 Type 2 diabetes mellitus without complications: Secondary | ICD-10-CM

## 2023-12-24 DIAGNOSIS — G4733 Obstructive sleep apnea (adult) (pediatric): Secondary | ICD-10-CM

## 2023-12-24 DIAGNOSIS — I2510 Atherosclerotic heart disease of native coronary artery without angina pectoris: Secondary | ICD-10-CM | POA: Diagnosis not present

## 2023-12-24 DIAGNOSIS — I4510 Unspecified right bundle-branch block: Secondary | ICD-10-CM

## 2023-12-24 DIAGNOSIS — N1831 Chronic kidney disease, stage 3a: Secondary | ICD-10-CM

## 2023-12-24 DIAGNOSIS — H4322 Crystalline deposits in vitreous body, left eye: Secondary | ICD-10-CM | POA: Diagnosis not present

## 2023-12-24 MED ORDER — MOUNJARO 2.5 MG/0.5ML ~~LOC~~ SOAJ
2.5000 mg | SUBCUTANEOUS | 0 refills | Status: DC
  Filled 2023-12-24: qty 2, 28d supply, fill #0

## 2023-12-24 NOTE — Addendum Note (Signed)
 Addended by: DARRELL BRUCKNER on: 12/24/2023 11:29 AM   Modules accepted: Orders

## 2023-12-24 NOTE — Patient Instructions (Signed)
 Medication Instructions:  No changes *If you need a refill on your cardiac medications before your next appointment, please call your pharmacy*  Please send one week of daily BP's through MyChart  Lab Work: None ordered If you have labs (blood work) drawn today and your tests are completely normal, you will receive your results only by: MyChart Message (if you have MyChart) OR A paper copy in the mail If you have any lab test that is abnormal or we need to change your treatment, we will call you to review the results.  Testing/Procedures: None ordered.   Follow-Up: At [facility], you and your health needs are our priority.  As part of our continuing mission to provide you with exceptional heart care, our providers are all part of one team.  This team includes your primary Cardiologist (physician) and Advanced Practice Providers or APPs (Physician Assistants and Nurse Practitioners) who all work together to provide you with the care you need, when you need it.  Your next appointment:   1 year(s)  Provider:   Jerel Balding, MD    We recommend signing up for the patient portal called MyChart.  Sign up information is provided on this After Visit Summary.  MyChart is used to connect with patients for Virtual Visits (Telemedicine).  Patients are able to view lab/test results, encounter notes, upcoming appointments, etc.  Non-urgent messages can be sent to your provider as well.   To learn more about what you can do with MyChart, go to [email or web address].

## 2023-12-24 NOTE — Telephone Encounter (Signed)
 Pharmacy Patient Advocate Encounter  Insurance verification completed.   The patient is insured through [employer] test claim for Mounjaro 2.5MG . Currently a quantity of 2 ml is a 28 day supply and the co-pay is 47.00 .   This test claim was processed through [facility]- copay amounts may vary at other pharmacies due to pharmacy/plan contracts, or as the patient moves through the different stages of their insurance plan.

## 2023-12-24 NOTE — Telephone Encounter (Signed)
 Called and spoke with patient regarding switching to Ozempic/Mounjaro and discontinuing glimepiride . Patient is willing. Both medications covered without PA. Briefly explained mechansim of action and dosing. Will have patient pick up at [facility] pharmacy and then will teach patient how to administer.

## 2023-12-24 NOTE — Telephone Encounter (Signed)
 Pharmacy Patient Advocate Encounter  Insurance verification completed.   The patient is insured through [employer] test claim for Ozempic 2MG / . Currently a quantity of is a 28 day supply and the co-pay is 47.00 .   This test claim was processed through [facility]- copay amounts may vary at other pharmacies due to pharmacy/plan contracts, or as the patient moves through the different stages of their insurance plan.

## 2024-01-04 ENCOUNTER — Telehealth: Payer: Self-pay | Admitting: Pulmonary Disease

## 2024-01-04 DIAGNOSIS — R0683 Snoring: Secondary | ICD-10-CM | POA: Diagnosis not present

## 2024-01-04 NOTE — Telephone Encounter (Signed)
 He continues to have severe OSA on HST on electrode B 0.5 V Please confirm t hat device wa son night of study 12/19/23 Please obtain next available appt with APP /me  - rep present to decide next steps - we may need awake endoscopy here as next step to find therapeutic level for him

## 2024-01-05 ENCOUNTER — Other Ambulatory Visit (HOSPITAL_COMMUNITY): Payer: Self-pay | Admitting: Student

## 2024-01-05 DIAGNOSIS — M25561 Pain in right knee: Secondary | ICD-10-CM

## 2024-01-05 NOTE — Telephone Encounter (Signed)
 You have some openings on your Inspire day on 8/21. Would you like him to be scheduled then, or would you prefer he be seen sooner?

## 2024-01-05 NOTE — Telephone Encounter (Signed)
 I was able to scheduled Mr. Trickel with [facility] for this Friday 8/1 at 10:30. The Inspire reps will be in attendance. Please let me know the next steps or what you'd like to do moving forward after appointment.

## 2024-01-07 ENCOUNTER — Encounter (HOSPITAL_BASED_OUTPATIENT_CLINIC_OR_DEPARTMENT_OTHER): Payer: Self-pay | Admitting: Primary Care

## 2024-01-07 ENCOUNTER — Ambulatory Visit (HOSPITAL_BASED_OUTPATIENT_CLINIC_OR_DEPARTMENT_OTHER): Admitting: Primary Care

## 2024-01-07 VITALS — BP 139/77 | HR 76 | Ht 72.0 in | Wt 200.0 lb

## 2024-01-07 DIAGNOSIS — G4733 Obstructive sleep apnea (adult) (pediatric): Secondary | ICD-10-CM

## 2024-01-07 NOTE — Patient Instructions (Addendum)
  VISIT SUMMARY: Today, you were seen for persistent apneic events despite adjustments to your Inspire therapy. We discussed your current settings, recent sleep study results, and your experience with Mounjaro  for weight loss. We also reviewed your upcoming MRI for knee pain.  YOUR PLAN: -OBSTRUCTIVE SLEEP APNEA: Obstructive sleep apnea is a condition where your airway becomes blocked during sleep, causing breathing pauses. Despite adjustments to your Inspire therapy, you are still experiencing apneic events. We will schedule a sleep endoscopy to better understand your airway dynamics and test different electrode configurations. Until then, continue with your current Inspire setting at level 4 on electrode B and try to avoid sleeping on your back.  -NAUSEA SECONDARY TO MOUNJARO  (TIRZEPATIDE ): You have been experiencing nausea after starting Mounjaro  injections, especially a few days after the injection. To help reduce nausea, take your Mounjaro  injection in the morning. Additionally, try eating smaller, more frequent meals, avoid fried and fatty foods, and separate liquids from meals.  -OVERWEIGHT: Your current BMI is 29, and you have lost about 15 pounds recently, likely due to Mounjaro  therapy. Continue with Mounjaro  for weight management and adhere to dietary modifications: eat smaller, more frequent meals, avoid fried and fatty foods.  INSTRUCTIONS: We will schedule a awake endoscopy with Dr. Jude and Saqib Cazarez team to assess your airway dynamics and test different electrode configurations. Continue with your current Inspire setting at level 4 on electrode B. Try to avoid sleeping on your back. Take your Mounjaro  injection in the morning to help reduce nausea and follow the recommended dietary modifications. Your MRI for knee pain is already scheduled, please bring remote with you.

## 2024-01-07 NOTE — Progress Notes (Signed)
 @Patient  ID: Jonathan Shepard, male    DOB: 27-Mar-1951, 73 y.o.   MRN: [id number]  Chief Complaint  Patient presents with   Follow-up    OSA     Referring provider: Bertell Satterfield, MD  HPI: 73 yo  man for follow-up of OSA.   PMH : CAD, DM, GERD, Nephrolithiasis, HLD, HTN    01/07/2024- Interim hx  Discussed the use of AI scribe software for clinical note transcription with the patient, who gave verbal consent to proceed.  History of Present Illness Jonathan Shepard is a 73 year old male with severe obstructive sleep apnea who presents with persistent apneic events despite Inspire therapy adjustments. He is accompanied by his wife and chris with Inspire team.   Hypoglossal nerve stimulator implanted on 11/17/22 [name]); device activated 12/25/22  He has been experiencing persistent apneic events despite adjustments to his Inspire therapy. He has tried electrodes A, B, and C, with the most recent being electrode B. A home sleep study conducted on July 13th showed 39 apneic events per hour. The device was confirmed to be activated during the study.  He is currently sleeping better on the current setting. He has been on level four, which is 0.5 voltage on electrode B, since his last visit in June. The only thing waking him up is the need to use the bathroom at 2 AM and 4 AM. He has not noticed any snoring with the current setting.  He has been on Mounjaro  for weight loss for two weeks, which initially caused nausea and vomiting, particularly in the middle of the night. He has lost about 15 pounds, with a current BMI of 29.  He has an MRI scheduled for his knee due to persistent pain following an accident and a fall.    Sleep sync report July 2- January 06, 2024 Total nights used 30/30 days (100%) > 4 hours Average usage 6 hours 53 mins Total pauses per night 1.5 Current amplitude 0.5V (upper limit 0.6/ lower limit 0.2)   Significant tests: 06/2023 HST on electrode C config 0.6 V  >> AHI 30/h, low sat 70 %   Inspire titration 03/2023 >> incoming amplitude 0.7, titrated between 0.5-1.3 V , events noted at all levels, no therapeutic amplitude identified HST January 2023 >> AHI 23.3   12/19/23 HST on electrode B config 0.5 >> AHI 39.9/hr, spo2 low 85%   Allergies  Allergen Reactions   Bee Venom Anaphylaxis and Itching     There is no immunization history on file for this patient.  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     clopidogrel  (PLAVIX ) 75 MG tablet Take 1 tablet (75 mg total) by mouth daily. 90  tablet 0   diclofenac (VOLTAREN) 75 MG EC tablet Take 75 mg by mouth as needed for mild pain (pain score 1-3).     empagliflozin  (JARDIANCE ) 10 MG TABS tablet Take 1 tablet (10 mg total) by mouth daily before breakfast. 90 tablet 3   EPINEPHrine  0.3 mg/0.3 mL IJ SOAJ injection Inject 0.3 mg into the muscle as needed for anaphylaxis. 2 each 1   fenofibrate  160 MG tablet Take 160 mg by mouth daily.     metFORMIN  (GLUCOPHAGE ) 1000 MG tablet Take 1,000 mg by mouth 2 (two) times daily with a meal.     metoprolol  tartrate (LOPRESSOR ) 25 MG tablet Take 25 mg by mouth 2 (two) times daily.     Multiple Vitamin (MULTIVITAMIN WITH MINERALS) TABS tablet Take 1 tablet by mouth daily.     oxyCODONE   (ROXICODONE ) 15 MG immediate release tablet Take 15 mg by mouth every 6 (six) hours as needed for pain.     pantoprazole  (PROTONIX ) 40 MG tablet TAKE 1 TABLET EVERY DAY 90 tablet 1   ramipril  (ALTACE ) 10 MG capsule Take 1 capsule (10 mg total) by mouth daily. 90 capsule 0   rosuvastatin  (CRESTOR ) 20 MG tablet Take 1 tablet (20 mg total) by mouth daily. 90 tablet 3   tirzepatide  (MOUNJARO ) 2.5 MG/0.5ML Pen Inject 2.5 mg into the skin once a week. 2 mL 0   No facility-administered medications prior to visit.    Review of Systems  Review of Systems  Constitutional: Negative.  Negative for fatigue.  Respiratory: Negative.  Negative for apnea.   Psychiatric/Behavioral:  Positive for sleep disturbance.    Physical Exam  BP 139/77 (BP Location: Left Arm, Patient Position: Sitting, Cuff Size: Large)   Pulse 76   Ht 6' (1.829 m)   Wt 200 lb (90.7 kg)   SpO2 98%   BMI 27.12 kg/m  Physical Exam Constitutional:      General: He is not in acute distress.    Appearance: Normal appearance. He is well-developed. He is not ill-appearing.  HENT:     Head: Normocephalic and atraumatic.     Mouth/Throat:     Mouth: Mucous membranes are moist.     Pharynx: Oropharynx is clear.  Cardiovascular:     Rate and Rhythm: Normal rate and regular rhythm.     Heart sounds: Normal heart sounds.  Pulmonary:     Effort: Pulmonary effort is normal. No respiratory distress.     Breath sounds: Normal breath sounds. No wheezing or rhonchi.  Musculoskeletal:        General: Normal range of motion.     Cervical back: Normal range of motion and neck supple.  Skin:    General: Skin is warm and dry.     Findings: No erythema or rash.  Neurological:     General: No focal deficit present.     Mental Status: He is alert and oriented to person, place, and time. Mental status is at baseline.  Psychiatric:        Mood and Affect: Mood normal.        Behavior: Behavior normal.        Thought Content: Thought  content normal.        Judgment: Judgment normal.       Lab Results:  CBC    Component Value Date/Time   WBC 4.9 10/14/2023 0416   RBC 4.32 10/14/2023 0416   HGB 12.7 (L) 10/14/2023 0416   HCT 38.9 (L) 10/14/2023 0416  PLT 162 10/14/2023 0416   MCV 90.0 10/14/2023 0416   MCH 29.4 10/14/2023 0416   MCHC 32.6 10/14/2023 0416   RDW 13.8 10/14/2023 0416   LYMPHSABS 1.3 10/13/2023 1521   MONOABS 0.8 10/13/2023 1521   EOSABS 0.1 10/13/2023 1521   BASOSABS 0.0 10/13/2023 1521    BMET    Component Value Date/Time   NA 135 10/15/2023 0317   K 4.0 10/15/2023 0317   CL 112 (H) 10/15/2023 0317   CO2 17 (L) 10/15/2023 0317   GLUCOSE 130 (H) 10/15/2023 0317   BUN 23 10/15/2023 0317   CREATININE 1.16 10/15/2023 0317   CREATININE 1.08 08/28/2016 0749   CALCIUM  8.4 (L) 10/15/2023 0317   GFRNONAA >60 10/15/2023 0317   GFRAA >60 08/05/2017 0802    BNP No results found for: BNP  ProBNP No results found for: PROBNP  Imaging: No results found.   Assessment & Plan:   1. OSA (obstructive sleep apnea) (Primary)  Assessment and Plan Assessment & Plan Obstructive sleep apnea with suboptimal response to Inspire therapy Persistent apneic events despite multiple electrode changes on Inspire therapy. Recent home sleep study on July 13th showed 39 apneic events per hour with the device activated. Current setting is level 4 on electrode B, which is 0.5 voltage. Reports improved sleep quality and reduction in snoring on this setting, though still experiences awakenings to urinate. - Schedule awake endoscopy with Dr. Jude and Draya Felker rep to assess airway dynamics and test electrode configurations. - Maintain current Inspire setting at level 4 on electrode B until endoscopy. - Advise to avoid sleeping on his back to potentially reduce apneic events.  Nausea secondary to Mounjaro  (tirzepatide ) Nausea experienced after starting Mounjaro  (tirzepatide ) injections, particularly noted two  to three days post-injection. Symptoms have improved over the past week. - Advise taking Mounjaro  injection in the morning to potentially reduce nausea. - Advised patient pause Inspire if he wakes up at night feeling nauseous  - We can prescribe Zofran  if continues  - Recommend dietary modifications: smaller, more frequent meals, avoid fried and fatty foods, and separate liquids from meals.  Overweight BMI of 29 with recent weight loss of approximately 15 pounds, likely due to Mounjaro  (tirzepatide ) therapy. - Continue Mounjaro  (tirzepatide ) therapy for weight management. - Encourage adherence to dietary modifications: smaller, more frequent meals, avoid fried and fatty foods.     Almarie LELON Ferrari, NP 01/07/2024

## 2024-01-07 NOTE — Telephone Encounter (Signed)
 Sounds good. Once we hear back from Dr. Jude, we'll get everything set up and inform both the patient and the INSPIRE rep so they can be present for procedure.

## 2024-01-07 NOTE — Telephone Encounter (Signed)
 Saw patient today for OSA, he did use Inspire the night of his sleep study. Currently on electrode B and level 4 (0.5V). He does report clinical benefit from use, wife has noticed reduction in snoring. Discussed next step would be to schedule awake endoscopy with yourself and Inspire team there. I advised patient that you were out of town for a couple of weeks, let us  know when we can schedule.

## 2024-01-11 DIAGNOSIS — Z7985 Long-term (current) use of injectable non-insulin antidiabetic drugs: Secondary | ICD-10-CM | POA: Diagnosis not present

## 2024-01-11 DIAGNOSIS — E663 Overweight: Secondary | ICD-10-CM | POA: Diagnosis not present

## 2024-01-11 DIAGNOSIS — E1165 Type 2 diabetes mellitus with hyperglycemia: Secondary | ICD-10-CM | POA: Diagnosis not present

## 2024-01-11 DIAGNOSIS — E782 Mixed hyperlipidemia: Secondary | ICD-10-CM | POA: Diagnosis not present

## 2024-01-11 DIAGNOSIS — Z7689 Persons encountering health services in other specified circumstances: Secondary | ICD-10-CM | POA: Diagnosis not present

## 2024-01-11 DIAGNOSIS — Z6827 Body mass index (BMI) 27.0-27.9, adult: Secondary | ICD-10-CM | POA: Diagnosis not present

## 2024-01-11 DIAGNOSIS — G4733 Obstructive sleep apnea (adult) (pediatric): Secondary | ICD-10-CM | POA: Diagnosis not present

## 2024-01-11 DIAGNOSIS — I10 Essential (primary) hypertension: Secondary | ICD-10-CM | POA: Diagnosis not present

## 2024-01-11 DIAGNOSIS — E119 Type 2 diabetes mellitus without complications: Secondary | ICD-10-CM | POA: Diagnosis not present

## 2024-01-13 ENCOUNTER — Ambulatory Visit (HOSPITAL_COMMUNITY)
Admission: RE | Admit: 2024-01-13 | Discharge: 2024-01-13 | Disposition: A | Discharge status: Home / Self Care | Source: Ambulatory Visit | Attending: Student | Admitting: Student

## 2024-01-13 DIAGNOSIS — X58XXXA Exposure to other specified factors, initial encounter: Secondary | ICD-10-CM | POA: Insufficient documentation

## 2024-01-13 DIAGNOSIS — S82144A Nondisplaced bicondylar fracture of right tibia, initial encounter for closed fracture: Secondary | ICD-10-CM | POA: Insufficient documentation

## 2024-01-13 DIAGNOSIS — M2241 Chondromalacia patellae, right knee: Secondary | ICD-10-CM | POA: Diagnosis not present

## 2024-01-13 DIAGNOSIS — S82001A Unspecified fracture of right patella, initial encounter for closed fracture: Secondary | ICD-10-CM | POA: Diagnosis not present

## 2024-01-13 DIAGNOSIS — S83241A Other tear of medial meniscus, current injury, right knee, initial encounter: Secondary | ICD-10-CM | POA: Insufficient documentation

## 2024-01-13 DIAGNOSIS — S82091G Other fracture of right patella, subsequent encounter for closed fracture with delayed healing: Secondary | ICD-10-CM | POA: Diagnosis not present

## 2024-01-13 DIAGNOSIS — M25561 Pain in right knee: Secondary | ICD-10-CM | POA: Diagnosis present

## 2024-01-13 DIAGNOSIS — R609 Edema, unspecified: Secondary | ICD-10-CM | POA: Diagnosis not present

## 2024-01-13 DIAGNOSIS — S8331XA Tear of articular cartilage of right knee, current, initial encounter: Secondary | ICD-10-CM | POA: Insufficient documentation

## 2024-01-20 MED ORDER — TIRZEPATIDE 5 MG/0.5ML ~~LOC~~ SOAJ: 5.0000 mg | SUBCUTANEOUS | 0 refills | Status: AC

## 2024-01-20 NOTE — Addendum Note (Signed)
 Addended by: DARRELL BRUCKNER on: 01/20/2024 03:50 PM   Modules accepted: Orders

## 2024-01-20 NOTE — Telephone Encounter (Signed)
 Pharmacy calling saying patient wants the medication sent to [facility]

## 2024-01-25 ENCOUNTER — Telehealth: Payer: Self-pay | Admitting: Pulmonary Disease

## 2024-01-25 DIAGNOSIS — Z9682 Presence of neurostimulator: Secondary | ICD-10-CM | POA: Insufficient documentation

## 2024-01-25 DIAGNOSIS — G4733 Obstructive sleep apnea (adult) (pediatric): Secondary | ICD-10-CM

## 2024-01-25 NOTE — Telephone Encounter (Signed)
 Please schedule the following:  Provider performing procedure:Nunzio Banet Diagnosis: OSA Procedure: DISE/ awake endoscopy  Has patient been spoken to by Provider and given informed consent? Y Anesthesia: None Date: 9/2 Alternate Date: 9/3  Time: AM Location: [facility] Does patient have Latex allergy? NA Medication Restriction/ Anticoagulate/Antiplatelet: NA Is patient on GLP-1 agonist? Y, must hold for procedure -x 1 week Pre-op Labs Ordered:determined by Anesthesia  Please coordinate Pre-op COVID Testing

## 2024-01-25 NOTE — Telephone Encounter (Signed)
 Informed the INSPIRE reps regarding Jonathan Shepard awake endoscopy scheduled for 9/2 at 07:30. Also contacted him to provide an update, and he is aware. Dr. Jude, his follow-up with you on 9/22 should it remain as scheduled? Please let me know thank you.

## 2024-01-25 NOTE — Telephone Encounter (Signed)
 INSPIRE reps notified of Jonathan Shepard awake endoscopy scheduled for 9/2 at 07:30 am. I contacted patient and he is aware of upcoming appointment. Dr. Jude he also has a follow-up with you on 9/22. Would you like that appointment to remain as scheduled? Please advise, thank you.

## 2024-01-27 DIAGNOSIS — I10 Essential (primary) hypertension: Secondary | ICD-10-CM | POA: Diagnosis not present

## 2024-01-27 DIAGNOSIS — E1129 Type 2 diabetes mellitus with other diabetic kidney complication: Secondary | ICD-10-CM | POA: Diagnosis not present

## 2024-01-27 DIAGNOSIS — L4050 Arthropathic psoriasis, unspecified: Secondary | ICD-10-CM | POA: Diagnosis not present

## 2024-01-27 DIAGNOSIS — G894 Chronic pain syndrome: Secondary | ICD-10-CM | POA: Diagnosis not present

## 2024-01-27 DIAGNOSIS — M5412 Radiculopathy, cervical region: Secondary | ICD-10-CM | POA: Diagnosis not present

## 2024-01-28 DIAGNOSIS — S8001XD Contusion of right knee, subsequent encounter: Secondary | ICD-10-CM | POA: Diagnosis not present

## 2024-01-28 DIAGNOSIS — I10 Essential (primary) hypertension: Secondary | ICD-10-CM | POA: Diagnosis not present

## 2024-01-28 DIAGNOSIS — Z125 Encounter for screening for malignant neoplasm of prostate: Secondary | ICD-10-CM | POA: Diagnosis not present

## 2024-01-28 DIAGNOSIS — E119 Type 2 diabetes mellitus without complications: Secondary | ICD-10-CM | POA: Diagnosis not present

## 2024-01-28 DIAGNOSIS — Z6827 Body mass index (BMI) 27.0-27.9, adult: Secondary | ICD-10-CM | POA: Diagnosis not present

## 2024-02-03 DIAGNOSIS — E1165 Type 2 diabetes mellitus with hyperglycemia: Secondary | ICD-10-CM | POA: Diagnosis not present

## 2024-02-03 DIAGNOSIS — Z0001 Encounter for general adult medical examination with abnormal findings: Secondary | ICD-10-CM | POA: Diagnosis not present

## 2024-02-03 DIAGNOSIS — I119 Hypertensive heart disease without heart failure: Secondary | ICD-10-CM | POA: Diagnosis not present

## 2024-02-03 DIAGNOSIS — R110 Nausea: Secondary | ICD-10-CM | POA: Diagnosis not present

## 2024-02-03 DIAGNOSIS — E782 Mixed hyperlipidemia: Secondary | ICD-10-CM | POA: Diagnosis not present

## 2024-02-03 DIAGNOSIS — E1122 Type 2 diabetes mellitus with diabetic chronic kidney disease: Secondary | ICD-10-CM | POA: Diagnosis not present

## 2024-02-03 DIAGNOSIS — N1832 Chronic kidney disease, stage 3b: Secondary | ICD-10-CM | POA: Diagnosis not present

## 2024-02-03 DIAGNOSIS — I10 Essential (primary) hypertension: Secondary | ICD-10-CM | POA: Diagnosis not present

## 2024-02-03 DIAGNOSIS — G4733 Obstructive sleep apnea (adult) (pediatric): Secondary | ICD-10-CM | POA: Diagnosis not present

## 2024-02-08 ENCOUNTER — Encounter (HOSPITAL_COMMUNITY)
Admission: RE | Disposition: A | Discharge status: Home / Self Care | Payer: Self-pay | Source: Home / Self Care | Attending: Pulmonary Disease

## 2024-02-08 ENCOUNTER — Ambulatory Visit (HOSPITAL_COMMUNITY)
Admission: RE | Admit: 2024-02-08 | Discharge: 2024-02-08 | Disposition: A | Discharge status: Home / Self Care | Attending: Pulmonary Disease | Admitting: Pulmonary Disease

## 2024-02-08 ENCOUNTER — Other Ambulatory Visit: Payer: Self-pay

## 2024-02-08 ENCOUNTER — Encounter (HOSPITAL_COMMUNITY): Payer: Self-pay | Admitting: Pulmonary Disease

## 2024-02-08 DIAGNOSIS — Z7902 Long term (current) use of antithrombotics/antiplatelets: Secondary | ICD-10-CM | POA: Insufficient documentation

## 2024-02-08 DIAGNOSIS — E119 Type 2 diabetes mellitus without complications: Secondary | ICD-10-CM | POA: Diagnosis not present

## 2024-02-08 DIAGNOSIS — Z6827 Body mass index (BMI) 27.0-27.9, adult: Secondary | ICD-10-CM | POA: Diagnosis not present

## 2024-02-08 DIAGNOSIS — Z7984 Long term (current) use of oral hypoglycemic drugs: Secondary | ICD-10-CM | POA: Insufficient documentation

## 2024-02-08 DIAGNOSIS — Z7985 Long-term (current) use of injectable non-insulin antidiabetic drugs: Secondary | ICD-10-CM | POA: Diagnosis not present

## 2024-02-08 DIAGNOSIS — E663 Overweight: Secondary | ICD-10-CM | POA: Diagnosis not present

## 2024-02-08 DIAGNOSIS — Z9682 Presence of neurostimulator: Secondary | ICD-10-CM | POA: Insufficient documentation

## 2024-02-08 DIAGNOSIS — I10 Essential (primary) hypertension: Secondary | ICD-10-CM | POA: Insufficient documentation

## 2024-02-08 DIAGNOSIS — E785 Hyperlipidemia, unspecified: Secondary | ICD-10-CM | POA: Insufficient documentation

## 2024-02-08 DIAGNOSIS — I2510 Atherosclerotic heart disease of native coronary artery without angina pectoris: Secondary | ICD-10-CM | POA: Diagnosis not present

## 2024-02-08 DIAGNOSIS — G4733 Obstructive sleep apnea (adult) (pediatric): Secondary | ICD-10-CM | POA: Insufficient documentation

## 2024-02-08 HISTORY — PX: DRUG INDUCED ENDOSCOPY: SHX6808

## 2024-02-08 LAB — GLUCOSE, CAPILLARY: Glucose-Capillary: 168 mg/dL — ABNORMAL HIGH (ref 70–99)

## 2024-02-08 SURGERY — DRUG INDUCED SLEEP ENDOSCOPY
Laterality: Bilateral

## 2024-02-08 MED ORDER — OXYMETAZOLINE HCL 0.05 % NA SOLN
1.0000 | Freq: Once | NASAL | Status: AC
  Administered 2024-02-08: 1 via NASAL

## 2024-02-08 MED ORDER — BUTAMBEN-TETRACAINE-BENZOCAINE 2-2-14 % EX AERO
INHALATION_SPRAY | CUTANEOUS | Status: DC | PRN
  Administered 2024-02-08: 2 via TOPICAL
  Administered 2024-02-08: 1 via TOPICAL

## 2024-02-08 MED ORDER — OXYMETAZOLINE HCL 0.05 % NA SOLN
NASAL | Status: AC
Start: 2024-02-08 — End: 2024-02-08
  Filled 2024-02-08: qty 15

## 2024-02-08 NOTE — Discharge Instructions (Signed)
 We adjusted your device to electrode B configuration, 0.2-0.6 V Set at level 3= 0.4 V  Please use a chin strap every night to keep your mouth closed

## 2024-02-08 NOTE — Telephone Encounter (Signed)
 New settings after awake endoscopy - elec B - 0.4 V Range 0.2-0.6V Use chin strap  Needs FU appt in 1-2 months

## 2024-02-08 NOTE — Telephone Encounter (Signed)
 Good Afternoon,   Scheduled Mr. Jakob for follow-up on 10/9 @8 :45 at [facility]. Patient updated, reminder printed and mailed, INSPIRE rep also notified.

## 2024-02-08 NOTE — H&P (Signed)
 Referring provider: Bertell Satterfield, MD   HPI: 73 yo  man for follow-up of OSA.   PMH : CAD, DM, GERD, Nephrolithiasis, HLD, HTN    01/07/2024- Interim hx  Discussed the use of AI scribe software for clinical note transcription with the patient, who gave verbal consent to proceed.   History of Present Illness DARROW BARREIRO is a 73 year old male with severe obstructive sleep apnea who presents with persistent apneic events despite Inspire therapy adjustments. He is accompanied by his wife and chris with Inspire team.    Hypoglossal nerve stimulator implanted on 11/17/22 [name]); device activated 12/25/22   He has been experiencing persistent apneic events despite adjustments to his Inspire therapy. He has tried electrodes A, B, and C, with the most recent being electrode B. A home sleep study conducted on July 13th showed 39 apneic events per hour. The device was confirmed to be activated during the study.   He is currently sleeping better on the current setting. He has been on level four, which is 0.5 voltage on electrode B, since his last visit in June. The only thing waking him up is the need to use the bathroom at 2 AM and 4 AM. He has not noticed any snoring with the current setting.   He has been on Mounjaro  for weight loss for two weeks, which initially caused nausea and vomiting, particularly in the middle of the night. He has lost about 15 pounds, with a current BMI of 29.   He has an MRI scheduled for his knee due to persistent pain following an accident and a fall.      Sleep sync report July 2- January 06, 2024 Total nights used 30/30 days (100%) > 4 hours Average usage 6 hours 53 mins Total pauses per night 1.5 Current amplitude 0.5V (upper limit 0.6/ lower limit 0.2)     Significant tests: 06/2023 HST on electrode C config 0.6 V >> AHI 30/h, low sat 70 %   Inspire titration 03/2023 >> incoming amplitude 0.7, titrated between 0.5-1.3 V , events noted at all levels, no  therapeutic amplitude identified HST January 2023 >> AHI 23.3    12/19/23 HST on electrode B config 0.5 >> AHI 39.9/hr, spo2 low 85%    Allergies      Allergies  Allergen Reactions   Bee Venom Anaphylaxis and Itching          There is no immunization history on file for this patient.       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       clopidogrel  (PLAVIX ) 75 MG tablet Take 1 tablet (75 mg total) by mouth daily. 90 tablet 0   diclofenac (VOLTAREN) 75 MG EC tablet Take 75 mg by mouth as needed for mild pain (pain score 1-3).       empagliflozin  (JARDIANCE ) 10 MG TABS tablet Take 1 tablet (10 mg total) by mouth daily before breakfast. 90 tablet 3   EPINEPHrine  0.3 mg/0.3 mL IJ SOAJ injection Inject 0.3 mg into the muscle as needed for anaphylaxis. 2 each 1   fenofibrate  160 MG tablet Take 160 mg by mouth daily.       metFORMIN  (GLUCOPHAGE ) 1000 MG tablet Take 1,000 mg by mouth 2 (two) times daily with a meal.       metoprolol  tartrate (LOPRESSOR ) 25 MG tablet Take 25 mg by mouth 2 (two) times daily.       Multiple Vitamin (MULTIVITAMIN WITH MINERALS) TABS tablet Take 1 tablet by mouth daily.       oxyCODONE  (ROXICODONE ) 15 MG immediate release tablet Take  15 mg by mouth every 6 (six) hours as needed for pain.       pantoprazole  (PROTONIX ) 40 MG tablet TAKE 1 TABLET EVERY DAY 90 tablet 1   ramipril  (ALTACE ) 10 MG capsule Take 1 capsule (10 mg total) by mouth daily. 90 capsule 0   rosuvastatin  (CRESTOR ) 20 MG tablet Take 1 tablet (20 mg total) by mouth daily. 90 tablet 3   tirzepatide  (MOUNJARO ) 2.5 MG/0.5ML Pen Inject 2.5 mg into the skin once a week. 2 mL 0      No facility-administered medications prior to visit.        Review of Systems   Review of Systems  Constitutional: Negative.  Negative for fatigue.  Respiratory: Negative.  Negative for apnea.   Psychiatric/Behavioral:  Positive for sleep disturbance.    Physical Exam   BP 139/77 (BP Location: Left Arm, Patient Position: Sitting, Cuff Size: Large)   Pulse 76   Ht 6' (1.829 m)   Wt 200 lb (90.7 kg)   SpO2 98%   BMI 27.12 kg/m  Physical Exam Constitutional:      General: He is not in acute distress.    Appearance: Normal appearance. He is well-developed. He is not ill-appearing.  HENT:     Head: Normocephalic and atraumatic.     Mouth/Throat:     Mouth: Mucous membranes are moist.     Pharynx: Oropharynx is clear.  Cardiovascular:     Rate and Rhythm: Normal rate and regular rhythm.     Heart sounds: Normal heart sounds.  Pulmonary:     Effort: Pulmonary effort is normal. No respiratory distress.     Breath sounds: Normal breath sounds. No wheezing or rhonchi.  Musculoskeletal:        General: Normal range of motion.     Cervical back: Normal range of motion and neck supple.  Skin:    General: Skin is warm and dry.     Findings: No erythema or rash.  Neurological:     General: No focal deficit present.     Mental Status: He is alert and oriented to person, place, and time. Mental status is at baseline.  Psychiatric:        Mood and Affect: Mood normal.        Behavior: Behavior normal.        Thought Content: Thought  content normal.        Judgment:  Judgment normal.         Lab Results:   CBC Labs (Brief)          Component Value Date/Time    WBC 4.9 10/14/2023 0416    RBC 4.32 10/14/2023 0416    HGB 12.7 (L) 10/14/2023 0416    HCT 38.9 (L) 10/14/2023 0416    PLT 162 10/14/2023 0416    MCV 90.0 10/14/2023 0416    MCH 29.4 10/14/2023 0416    MCHC 32.6 10/14/2023 0416    RDW 13.8 10/14/2023 0416    LYMPHSABS 1.3 10/13/2023 1521    MONOABS 0.8 10/13/2023 1521    EOSABS 0.1 10/13/2023 1521    BASOSABS 0.0 10/13/2023 1521        BMET Labs (Brief)          Component Value Date/Time    NA 135 10/15/2023 0317    K 4.0 10/15/2023 0317    CL 112 (H) 10/15/2023 0317    CO2 17 (L) 10/15/2023 0317    GLUCOSE 130 (H) 10/15/2023 0317    BUN 23 10/15/2023 0317    CREATININE 1.16 10/15/2023 0317    CREATININE 1.08 08/28/2016 0749    CALCIUM  8.4 (L) 10/15/2023 0317    GFRNONAA >60 10/15/2023 0317    GFRAA >60 08/05/2017 0802        BNP Labs (Brief)  No results found for: BNP     ProBNP Labs (Brief)  No results found for: PROBNP     Imaging: Imaging Results  No results found.       Assessment & Plan:    1. OSA (obstructive sleep apnea) (Primary)   Assessment and Plan Assessment & Plan Obstructive sleep apnea with suboptimal response to Inspire therapy Persistent apneic events despite multiple electrode changes on Inspire therapy. Recent home sleep study on July 13th showed 39 apneic events per hour with the device activated. Current setting is level 4 on electrode B, which is 0.5 voltage. Reports improved sleep quality and reduction in snoring on this setting, though still experiences awakenings to urinate. - Schedule awake endoscopy with Dr. Jude and [facility] rep to assess airway dynamics and test electrode configurations. - Maintain current Inspire setting at level 4 on electrode B until endoscopy. - Advise to avoid sleeping on his back to potentially reduce apneic events.   Nausea secondary to  Mounjaro  (tirzepatide ) Nausea experienced after starting Mounjaro  (tirzepatide ) injections, particularly noted two to three days post-injection. Symptoms have improved over the past week. - Advise taking Mounjaro  injection in the morning to potentially reduce nausea. - Advised patient pause Inspire if he wakes up at night feeling nauseous  - We can prescribe Zofran  if continues  - Recommend dietary modifications: smaller, more frequent meals, avoid fried and fatty foods, and separate liquids from meals.   Overweight BMI of 29 with recent weight loss of approximately 15 pounds, likely due to Mounjaro  (tirzepatide ) therapy. - Continue Mounjaro  (tirzepatide ) therapy for weight management. - Encourage adherence to dietary modifications: smaller, more frequent meals, avoid fried and fatty foods.         Almarie LELON Ferrari, NP 01/07/2024   Ozell GORMAN Chester has presented today for procedure, with the diagnosis of OSA s/p inspire. The various methods of treatment have been discussed with the patient and family. After consideration of risks, benefits and other options for treatment, the patient has consented to Procedure(s) :  DISE / endoscopy .  The patient's history has been reviewed, patient examined, no change in status, stable for surgery. I have reviewed the patient's chart and labs. Questions were answered to the patient's satisfaction  Allysa Governale V. Jude MD

## 2024-02-08 NOTE — Op Note (Signed)
 Procedure: Evaluation of sleep-disordered breathing by examination of upper airway using an endoscope  CPT Codes: Evaluation of sleep-disordered breathing by examination of upper airway using an endoscope  Pre-Op Diagnose: Moderate /Severe obstructive sleep apnea with positive airway pressure intolerance (ICD-10 G47.33).  Post-Op Diagnosis: Moderate /Severe obstructive sleep apnea with positive pressure airway intolerance (ICD-10 G47.33). Advanced electrode management of inspire device  ANESTHESIA: topical cetacaine  to nare  ESTIMATED BLOOD LOSS: None.  COMPLICATIONS: None.  BRIEF CLINICAL HISTORY: This is a 73 year old patient with a history of moderate to severe symptomatic obstructive sleep apnea, who is intolerant and unable to achieve benefit with positive pressure therapy.He  presents today for awake endoscopy to better characterize her locations and pattern of obstruction and to adjust electrode settings on inspire device  PROCEDURE FINDINGS: Best setting was electrode B 0.4V with mouth closed  DESCRIPTION OF PROCEDURE: The patient was brought to the endoscopy room and was anesthetized via cetacaine  to the nares. DUring awake endoscopy, various settings were tried A - no optimal opening B- 0.4 great opening C-0.6 good opening,  D-0.3 good, comparable to C E  - poor opening  Best airway opening was with mouth closed on all ettings Neck flexion made worse. Jaw thrust significantly improved. Left lateral position - similar opening to back position     I was present for and performed the entire procedure.  Dictated By: Harden ROCKFORD Jude MD  Post-Op Plan: EN evaluation for implantation  Diagnostic Codes: G47.33 Obstructive sleep apnea (adult)

## 2024-02-10 ENCOUNTER — Encounter (HOSPITAL_COMMUNITY): Payer: Self-pay | Admitting: Pulmonary Disease

## 2024-02-28 ENCOUNTER — Ambulatory Visit (HOSPITAL_BASED_OUTPATIENT_CLINIC_OR_DEPARTMENT_OTHER): Admitting: Pulmonary Disease

## 2024-03-02 DIAGNOSIS — L720 Epidermal cyst: Secondary | ICD-10-CM | POA: Diagnosis not present

## 2024-03-02 DIAGNOSIS — L570 Actinic keratosis: Secondary | ICD-10-CM | POA: Diagnosis not present

## 2024-03-02 DIAGNOSIS — X32XXXA Exposure to sunlight, initial encounter: Secondary | ICD-10-CM | POA: Diagnosis not present

## 2024-03-02 DIAGNOSIS — Z1283 Encounter for screening for malignant neoplasm of skin: Secondary | ICD-10-CM | POA: Diagnosis not present

## 2024-03-02 DIAGNOSIS — D225 Melanocytic nevi of trunk: Secondary | ICD-10-CM | POA: Diagnosis not present

## 2024-03-16 ENCOUNTER — Ambulatory Visit (HOSPITAL_BASED_OUTPATIENT_CLINIC_OR_DEPARTMENT_OTHER): Admitting: Pulmonary Disease

## 2024-03-16 ENCOUNTER — Encounter (HOSPITAL_BASED_OUTPATIENT_CLINIC_OR_DEPARTMENT_OTHER): Payer: Self-pay | Admitting: Pulmonary Disease

## 2024-03-16 VITALS — BP 133/78 | HR 82 | Ht 72.0 in | Wt 202.5 lb

## 2024-03-16 DIAGNOSIS — Z9682 Presence of neurostimulator: Secondary | ICD-10-CM

## 2024-03-16 DIAGNOSIS — G4733 Obstructive sleep apnea (adult) (pediatric): Secondary | ICD-10-CM

## 2024-03-16 DIAGNOSIS — J339 Nasal polyp, unspecified: Secondary | ICD-10-CM | POA: Diagnosis not present

## 2024-03-16 DIAGNOSIS — E669 Obesity, unspecified: Secondary | ICD-10-CM | POA: Diagnosis not present

## 2024-03-16 NOTE — Progress Notes (Signed)
 Subjective:    Patient ID: Jonathan Shepard, male    DOB: 1951-05-02, 73 y.o.   MRN: [id number]   73 yo  man for follow-up of OSA.   PMH : CAD, DM, GERD, Nephrolithiasis, HLD, HTN    He was CPAP intolerant and had hypoglossal nerve stimulator implanted on 11/17/22 Jonathan Shepard) - device activated 12/25/22 Start delay 45 minutes -pause time 15 minutes -duration 8 hours   02/2023 OV >>between 0.4 to 0.5 volts  Pulse width 60, rate 40   06/2023 He is at level 4 = 0.8V .  It will be difficult to achieve therapeutic amplitude with  electrode configuration A (+-+) We checked electrode configuration B with a range of 0.3-0.5 and configuration C with a range of 0.4-0.8.  Sensation 0.4 V , functional level 0.4 V lower limit, upper limit 0.8 V   Start delay at 45 minutes, pause time increased to 30 minutes, duration 9 hours On B & D configurations, he had good tongue motion but not enough for range. Set at electrode C, 0.6 V   08/2023  OV  -residual AHI high on electrode C switched back to electrode B 0.2-0.6 range    11/2023  Incoming >> set at electrode B configuration, 0.4 volts,since last OV. Compliance is high, with an average nightly use of 7.5 hours and a compliance rate of 97% >> increased to level 4 =0.5V     Discussed the use of AI scribe software for clinical note transcription with the patient, who gave verbal consent to proceed.  History of Present Illness Jonathan Shepard is a 73 year old male with sleep apnea who presents for follow-up regarding his Inspire device and sleep quality.  He has lost approximately 40 pounds, reducing his weight to around 200 pounds, which he believes has improved his sleep apnea. He uses an Inspire device but is dissatisfied with the manual data upload requirement, as he often falls behind due to not keeping his phone near the device at night. He experiences about two apneic pauses per night and wakes up to use the bathroom, a change from previous years  when he could sleep through the night.  He has difficulty breathing through his nose due to a polyp in the left nostril. He avoids using nasal sprays like Afrin. He wakes up with a dry mouth, indicating nocturnal mouth breathing. He moves frequently during sleep and does not maintain a single position.    Incoming elec B 0.4 V Significant tests/ events reviewed   02/2024 Awake endoscopy >> A - no optimal opening B- 0.4 great opening C-0.6 good opening,  D-0.3 good, comparable to C E  - poor opening   Best airway opening was with mouth closed on all ettings Neck flexion made worse. Jaw thrust significantly improved. Left lateral position - similar opening to back position 12/19/23 HST on electrode B config 0.5 >> AHI 39.9/hr, spo2 low 85%  06/2023 HST on electrode C config 0.6 V >> AHI 30/h, low sat 70 %   Inspire titration 03/2023 >> incoming amplitude 0.7, titrated between 0.5-1.3 V , events noted at all levels, no therapeutic amplitude identified HST January 2023 >> AHI 23.3    Review of Systems  neg for any significant sore throat, dysphagia, itching, sneezing, nasal congestion or excess/ purulent secretions, fever, chills, sweats, unintended wt loss, pleuritic or exertional cp, hempoptysis, orthopnea pnd or change in chronic leg swelling. Also denies presyncope, palpitations, heartburn, abdominal pain, nausea, vomiting, diarrhea or  change in bowel or urinary habits, dysuria,hematuria, rash, arthralgias, visual complaints, headache, numbness weakness or ataxia.      Objective:   Physical Exam  Gen. Pleasant, obese, in no distress ENT - no lesions, no post nasal drip Neck: No JVD, no thyromegaly, no carotid bruits Lungs: no use of accessory muscles, no dullness to percussion, decreased without rales or rhonchi  Cardiovascular: Rhythm regular, heart sounds  normal, no murmurs or gallops, no peripheral edema Musculoskeletal: No deformities, no cyanosis or clubbing , no  tremors   Tongue stimulation/protrusion checked with programmer at multiple levels        Assessment & Plan:   Assessment and Plan Assessment & Plan Obstructive sleep apnea Obstructive sleep apnea managed with hypoglossal nerve stimulator. He reports dissatisfaction with the technology, particularly the need for manual data uploads and continuous stimulation throughout the night. Experiences approximately two pauses per night and frequent nocturia. Oral breathing is predominant due to nasal obstruction. Current settings of the hypoglossal nerve stimulator are optimal. Compliance report reviewed 1.3 pauses/night - Conduct a two-night home sleep study, one night with and one night without a chin strap. - Maintain current settings of the hypoglossal nerve stimulator at electrode B p0.4 with PWR 90/33  - Provide a chin strap for trial use.  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 He has not used nasal sprays due to concerns about potential addiction, as experienced by his wife. Open to trying Afrin (oxymetazoline ) nasal spray to alleviate nasal obstruction and potentially improve sleep quality. - Recommend Afrin (oxymetazoline ) nasal spray, to be used every other night, to assess its effect on nasal obstruction.  Obesity Obesity well-managed with significant weight loss of at least 40 pounds, beneficial for managing obstructive sleep apnea. Maintaining weight around 200 pounds, down from a previous high of 250-260 pounds.

## 2024-03-16 NOTE — Patient Instructions (Signed)
 X  2 night HST - use chin strap on second night  X Rx for chin strap  X Rx for oxymetazoline  / Afrin nasal spray - each nare

## 2024-05-04 ENCOUNTER — Encounter

## 2024-05-04 DIAGNOSIS — Z9682 Presence of neurostimulator: Secondary | ICD-10-CM

## 2024-05-04 DIAGNOSIS — G4733 Obstructive sleep apnea (adult) (pediatric): Secondary | ICD-10-CM

## 2024-05-15 ENCOUNTER — Telehealth: Payer: Self-pay | Admitting: Pulmonary Disease

## 2024-05-15 DIAGNOSIS — R069 Unspecified abnormalities of breathing: Secondary | ICD-10-CM | POA: Diagnosis not present

## 2024-05-15 DIAGNOSIS — G4733 Obstructive sleep apnea (adult) (pediatric): Secondary | ICD-10-CM | POA: Diagnosis not present

## 2024-05-15 NOTE — Telephone Encounter (Signed)
 HST showed mod  OSA with AHI 23/ hr & low sat of 86%  This was presumably on elec B configuration - please confirm device was on night of study ALso we had requested him to perform study with a chin strap - did he use this on night of study? Make routine appt for FU on inspire day

## 2024-05-18 NOTE — Telephone Encounter (Signed)
 Spoke with pt nasal canula was on upside down the 1st night but on correctly every night after, also his device was on every night and he did not use chinstrap at all

## 2024-05-19 NOTE — Telephone Encounter (Signed)
 Thank you [name] for checking on that.   Spoke with Jonathan Shepard to schedule appointment. His Inspire follow up appointment with Dr. Jude has been scheduled for Thursday, 07/06/24 @9 :00 AM.

## 2024-06-30 ENCOUNTER — Telehealth (HOSPITAL_BASED_OUTPATIENT_CLINIC_OR_DEPARTMENT_OTHER): Payer: Self-pay

## 2024-06-30 NOTE — Telephone Encounter (Signed)
 Left a message to call back to schedule tele preop appt.

## 2024-06-30 NOTE — Telephone Encounter (Signed)
"  ° °  Pre-operative Risk Assessment    Patient Name: Jonathan Shepard  DOB: 1950-12-30 MRN: [id number]   Date of last office visit: 12/24/23 with Croitoru Date of next office visit: NA  Request for Surgical Clearance    Procedure:  Right Carpal Tunnel release   Date of Surgery:  Clearance TBD                                 Surgeon:  Dr. Camella Surgeon's Group or Practice Name:  [facility]  Phone number:  [phone] Fax number:  [phone]   Type of Clearance Requested:   - Medical  - Pharmacy:  Hold Clopidogrel  (Plavix ) not indicated   Type of Anesthesia:  Local    Additional requests/questions:    [name]   06/30/2024, 1:40 PM   "

## 2024-06-30 NOTE — Telephone Encounter (Signed)
" ° °  Name: Jonathan Shepard  DOB: 05-16-1951  MRN: [id number]  Primary Cardiologist: Jerel Balding, MD   Preoperative team, please contact this patient and set up a phone call appointment for further preoperative risk assessment. Please obtain consent and complete medication review. Thank you for your help.  I also confirmed the patient resides in the state of [place] . As per [facility] Medical Board telemedicine laws, the patient must reside in the state in which the provider is licensed.   [name], PA-C 06/30/2024, 1:51 PM [facility]    "

## 2024-07-04 ENCOUNTER — Telehealth (HOSPITAL_BASED_OUTPATIENT_CLINIC_OR_DEPARTMENT_OTHER): Payer: Self-pay | Admitting: *Deleted

## 2024-07-04 NOTE — Telephone Encounter (Signed)
Pt returning call / please advise  

## 2024-07-04 NOTE — Telephone Encounter (Signed)
 Pt has been scheduled tele preop appt 07/07/24 as pt states surgeon will not schedule until he has been cleared. Pt states the surgeon is trying for early Feb 2026. Med rec and consent are done.      Patient Consent for Virtual Visit        Jonathan Shepard has provided verbal consent on 07/04/2024 for a virtual visit (video or telephone).   CONSENT FOR VIRTUAL VISIT FOR:  Jonathan Shepard  By participating in this virtual visit I agree to the following:  I hereby voluntarily request, consent and authorize [facility] and its employed or contracted physicians, physician assistants, nurse practitioners or other licensed health care professionals (the Practitioner), to provide me with telemedicine health care services (the Services) as deemed necessary by the treating Practitioner. I acknowledge and consent to receive the Services by the Practitioner via telemedicine. I understand that the telemedicine visit will involve communicating with the Practitioner through live audiovisual communication technology and the disclosure of certain medical information by electronic transmission. I acknowledge that I have been given the opportunity to request an in-person assessment or other available alternative prior to the telemedicine visit and am voluntarily participating in the telemedicine visit.  I understand that I have the right to withhold or withdraw my consent to the use of telemedicine in the course of my care at any time, without affecting my right to future care or treatment, and that the Practitioner or I may terminate the telemedicine visit at any time. I understand that I have the right to inspect all information obtained and/or recorded in the course of the telemedicine visit and may receive copies of available information for a reasonable fee.  I understand that some of the potential risks of receiving the Services via telemedicine include:  Delay or interruption in medical  evaluation due to technological equipment failure or disruption; Information transmitted may not be sufficient (e.g. poor resolution of images) to allow for appropriate medical decision making by the Practitioner; and/or  In rare instances, security protocols could fail, causing a breach of personal health information.  Furthermore, I acknowledge that it is my responsibility to provide information about my medical history, conditions and care that is complete and accurate to the best of my ability. I acknowledge that Practitioner's advice, recommendations, and/or decision may be based on factors not within their control, such as incomplete or inaccurate data provided by me or distortions of diagnostic images or specimens that may result from electronic transmissions. I understand that the practice of medicine is not an exact science and that Practitioner makes no warranties or guarantees regarding treatment outcomes. I acknowledge that a copy of this consent can be made available to me via my patient portal [facility] MyChart), or I can request a printed copy by calling the office of [facility].    I understand that my insurance will be billed for this visit.   I have read or had this consent read to me. I understand the contents of this consent, which adequately explains the benefits and risks of the Services being provided via telemedicine.  I have been provided ample opportunity to ask questions regarding this consent and the Services and have had my questions answered to my satisfaction. I give my informed consent for the services to be provided through the use of telemedicine in my medical care

## 2024-07-04 NOTE — Telephone Encounter (Signed)
 Pt has been scheduled tele preop appt 07/07/24 as pt states surgeon will not schedule until he has been cleared. Pt states the surgeon is trying for early Feb 2026. Med rec and consent are done.

## 2024-07-04 NOTE — Telephone Encounter (Signed)
 2nd attempt to reach pt regarding surgical clearance and the need for an TELE appointment.  Left pt a detailed message to call back and get that scheduled.

## 2024-07-06 ENCOUNTER — Encounter (HOSPITAL_BASED_OUTPATIENT_CLINIC_OR_DEPARTMENT_OTHER): Payer: Self-pay | Admitting: Pulmonary Disease

## 2024-07-06 ENCOUNTER — Ambulatory Visit (HOSPITAL_BASED_OUTPATIENT_CLINIC_OR_DEPARTMENT_OTHER): Admitting: Pulmonary Disease

## 2024-07-06 VITALS — BP 140/81 | HR 77 | Ht 72.0 in | Wt 205.4 lb

## 2024-07-06 DIAGNOSIS — G4733 Obstructive sleep apnea (adult) (pediatric): Secondary | ICD-10-CM | POA: Diagnosis not present

## 2024-07-06 DIAGNOSIS — Z9682 Presence of neurostimulator: Secondary | ICD-10-CM

## 2024-07-06 NOTE — Patient Instructions (Signed)
" °  VISIT SUMMARY: During your visit, we discussed the management of your obstructive sleep apnea with your hypoglossal nerve stimulator. You reported good sleep quality and high compliance with the device. We also reviewed your recent sleep study results and discussed additional strategies to improve your sleep apnea management.  YOUR PLAN: -OBSTRUCTIVE SLEEP APNEA: Obstructive sleep apnea is a condition where the airway becomes blocked during sleep, causing breathing pauses. Your condition is managed with a hypoglossal nerve stimulator, which you use 96% of the time. Your recent sleep study showed improvement, with 23 events per hour compared to 37 events per hour previously. We discussed using a chin strap to keep your mouth closed during sleep and potentially seeing a dentist for a custom mouth guard if needed. We also adjusted your device settings to decrease therapy duration to 7 hours, set a start delay to 15 minutes, and a pause time to 15 minutes.  INSTRUCTIONS: Please continue using your hypoglossal nerve stimulator as directed. Try using a chin strap to help keep your mouth closed during sleep. If you experience any issues or if further reduction in apnea events is needed, we may consider referring you to a dentist for a custom mouth guard. We will follow up in 6 months to review your progress.    Contains text generated by [employer].   "

## 2024-07-06 NOTE — Progress Notes (Signed)
 "  Subjective:    Patient ID: Jonathan Shepard, male    DOB: 06-10-1950, 74 y.o.   MRN: [id number]   74 yo  man for follow-up of OSA.   PMH : CAD, DM, GERD, Nephrolithiasis, HLD, HTN    He was CPAP intolerant and had hypoglossal nerve stimulator implanted on 11/17/22 [name]) - device activated 12/25/22 Start delay 45 minutes -pause time 15 minutes -duration 8 hours   02/2023 OV >>between 0.4 to 0.5 volts  Pulse width 60, rate 40   06/2023 He is at level 4 = 0.8V .  It will be difficult to achieve therapeutic amplitude with  electrode configuration A (+-+) We checked electrode configuration B with a range of 0.3-0.5 and configuration C with a range of 0.4-0.8.  Sensation 0.4 V , functional level 0.4 V lower limit, upper limit 0.8 V   Start delay at 45 minutes, pause time increased to 30 minutes, duration 9 hours On B & D configurations, he had good tongue motion but not enough for range. Set at electrode C, 0.6 V   08/2023  OV  -residual AHI high on electrode C switched back to electrode B 0.2-0.6 range    11/2023  Incoming >> set at electrode B configuration, 0.4 volts,since last OV. Compliance is high, with an average nightly use of 7.5 hours and a compliance rate of 97% >> increased to level 4 =0.5V     Discussed the use of AI scribe software for clinical note transcription with the patient, who gave verbal consent to proceed.  History of Present Illness Jonathan Shepard is a 74 year old male with a hypoglossal nerve stimulator implant who presents for follow-up regarding his sleep apnea management.  He reports generally good sleep, going to bed around 9:30 to 10:00 PM and waking around 4:30 to 5:00 AM. He wakes once nightly around 3:00 AM to urinate and returns to sleep. He often wakes with dry mouth.  He uses his hypoglossal nerve stimulator nightly and remains highly compliant, with reported usage of 96%. He sometimes has the device activate unexpectedly when away from home,  which he attributes to not turning it off correctly. He typically pauses the device when he wakes at night to use the bathroom.  He has had significant weight loss on Mounjaro  from 250 lbs to 195 lbs but feels bloated on the medication. His wife has not noticed snoring, and the device rarely wakes him. A study in November showed his apnea index improved from 37 to 23 events per hour with therapy.  He intermittently uses nasal strips and notes a unilateral nasal polyp, which is not symptomatic enough for him to seek treatment. He does not use Afrin or other nasal sprays.  He prefers to avoid additional medications for his sleep apnea and relies on morning coffee as part of his routine to feel more awake.      Significant tests/ events reviewed   02/2024 Awake endoscopy >> A - no optimal opening B- 0.4 great opening C-0.6 good opening,  D-0.3 good, comparable to C E  - poor opening   Best airway opening was with mouth closed on all ettings Neck flexion made worse. Jaw thrust significantly improved. Left lateral position - similar opening to back position 12/19/23 HST on electrode B config 0.5 >> AHI 39.9/hr, spo2 low 85%  06/2023 HST on electrode C config 0.6 V >> AHI 30/h, low sat 70 %   Inspire titration 03/2023 >> incoming amplitude 0.7,  titrated between 0.5-1.3 V , events noted at all levels, no therapeutic amplitude identified HST January 2023 >> AHI 23.3     Review of Systems  neg for any significant sore throat, dysphagia, itching, sneezing, nasal congestion or excess/ purulent secretions, fever, chills, sweats, unintended wt loss, pleuritic or exertional cp, hempoptysis, orthopnea pnd or change in chronic leg swelling. Also denies presyncope, palpitations, heartburn, abdominal pain, nausea, vomiting, diarrhea or change in bowel or urinary habits, dysuria,hematuria, rash, arthralgias, visual complaints, headache, numbness weakness or ataxia.      Objective:   Physical  Exam  Gen. Pleasant, obese, in no distress ENT - no lesions, no post nasal drip Neck: No JVD, no thyromegaly, no carotid bruits Lungs: no use of accessory muscles, no dullness to percussion, decreased without rales or rhonchi  Cardiovascular: Rhythm regular, heart sounds  normal, no murmurs or gallops, no peripheral edema Musculoskeletal: No deformities, no cyanosis or clubbing , no tremors   Tongue stimulation/protrusion checked with programmer at multiple levels       Assessment & Plan:   Assessment and Plan Assessment & Plan Obstructive sleep apnea managed with hypoglossal nerve stimulator Obstructive sleep apnea is managed with a hypoglossal nerve stimulator. He reports good sleep quality with no significant issues. The device is used 96% of the time with an average of 0.7 pauses per night. Recent sleep study showed 23 events per hour, improved from 37 events per hour previously. Oxygen desaturation was not severe, indicating reduced cardiac risk. He experiences mouth breathing, likely due to nasal obstruction from a left-sided nasal polyp. He is compliant with therapy and tolerates it well. Discussed potential combination therapy with a chin strap or dental mouth guard to further reduce apnea events. He is open to trying a chin strap. Discussed the risks of malocclusion with over-the-counter mouth guards and the benefits of a dental evaluation for a custom mouth guard. - Decreased therapy duration to 7 hours. - Set start delay to 15 minutes and pause time to 15 minutes. - Recommended using a chin strap to keep the mouth closed during sleep. - Will consider referral to a dentist for a custom mouth guard if further reduction in apnea events is needed. - Scheduled follow-up in 6 months.     "

## 2024-07-07 ENCOUNTER — Ambulatory Visit: Attending: Internal Medicine

## 2024-07-07 DIAGNOSIS — Z01810 Encounter for preprocedural cardiovascular examination: Secondary | ICD-10-CM | POA: Diagnosis not present
